# Patient Record
Sex: Female | Born: 1935 | Race: White | Hispanic: No | Smoking: Former smoker
Health system: Southern US, Community
[De-identification: ages and names within clinical notes are randomized; demographics above are authoritative.]

## PROBLEM LIST (undated history)

## (undated) DIAGNOSIS — I4891 Unspecified atrial fibrillation: Secondary | ICD-10-CM

## (undated) HISTORY — PX: CHOLECYSTECTOMY: SHX55

## (undated) HISTORY — PX: BREAST LUMPECTOMY: SHX2

## (undated) HISTORY — PX: ABDOMINAL HYSTERECTOMY: SHX81

---

## 2017-11-12 ENCOUNTER — Telehealth: Payer: Self-pay | Admitting: Hematology and Oncology

## 2017-11-12 ENCOUNTER — Encounter: Payer: Self-pay | Admitting: Hematology and Oncology

## 2017-11-12 NOTE — Telephone Encounter (Signed)
New referral received from Dr. Lennette Bihari Via at [place] for history of iron deficiency. Pt has been scheduled to see Dr. Audelia Hives on 10/14 at 1pm. Pt aware to arrive 30 minutes early. Letter mailed.

## 2017-11-22 NOTE — Progress Notes (Signed)
Katrina Randolph  Patient Name:  Katrina Randolph  DOB: 08/06/1930   Date of Service: November 24, 2017  Referring Provider: Lennette Bihari Via MD [place], [place] [address]   Consulting Physician: Henreitta Leber, MD Hematology/Oncology  Reason for Referral: In the setting of iron deficiency anemia over the past 3 years, she presents now for further diagnostic and therapeutic recommendations.  History Present Illness: Katrina Randolph is an 84 year old resident of [place], relocating in June, 2019 from [place], [place] whose past medical history is significant for major depressive disorder; hyperlipidemia; hypothyroidism; borderline diabetes; primary hypertension; gastroesophageal reflux disease; systolic congestive heart failure; paroxysmal atrial fibrillation; and a history of breast cancer treated approximately 4-7 years ago with surgery, followed by radiation therapy.  Although postoperative hormonal therapy was offered, she declined.  The radiation therapy was directed by Dr. Freddi Starr, radiation oncology, at [facility].  Those records are not available on the existing chart but have been requested.  Her primary care physician is Dr. Lennette Bihari Via.  She is accompanied by her attentive daughter Katrina Randolph.  Only recently did she establish care with Dr. Lynelle Doctor.  On October 27, 2017 initial laboratory studies were obtained especially given her chronic iron deficiency anemia over several years of unclear etiology.  In the past she has received parenteral iron infusions when residing in [facility].  She was apparently intolerant of oral iron which caused "gastritis."  She cannot recall the name of her hematologist.  Those records are also not available on the existing chart.  A complete blood count September 18 showed hemoglobin 9.4 hematocrit 30.8 MCV 74.8 MCH 22.8 RDW 18.2 WBC 6.4 with 75%  neutrophils 12% lymphocytes 10% monocytes 2% eosinophils 1% basophils; platelets 247,000.  Iron studies from September 16 showed ferritin 14.7 serum iron 16 TIBC 564 iron saturation 3%.  Hemoglobin A1c 5.8.  Katrina Randolph is not a vegetarian.  She is "borderline diabetic." She has a long-standing history of paroxysmal atrial fibrillation for at least the past 4 years.  She reports no seizure disorder or stroke syndrome.  She has thyroid disease for the past 5 years.  She has no coronary artery disease, angina, prior myocardial infarction.  She reports no viral hepatitis, inflammatory bowel disease, or symptomatic diverticulosis.  She has never had a screening colonoscopy.  Apparently it was recommended and planned, but never performed.  While residing in [place] she had yearly mammography following her lumpectomy for 5 consecutive years.  Her last mammogram was in February.  She has no follow-up mammogram scheduled.  It is her understanding that there is no need for further mammographic surveillance after 5 years.  She has no rheumatoid or gouty arthritis.  She denies peripheral arterial venous thromboembolic disease.    Both her appetite and weight remain stable.  She has no unusual headache, dizziness, lightheadedness, syncope, near syncopal episodes. She has no rash or itching.  She denies any bleeding tendency.  There are no new visual changes or hearing deficit.  She denies cough, sore throat, or orthopnea.  She has no dyspnea at rest or on exertion.  She has chronic but stable exertional dyspnea.  There is no pain or difficulty in swallowing.  No fever, shaking chills, sweats, or flulike symptoms are apparent.  She denies heartburn or indigestion.  She has no nausea, vomiting, diarrhea, or constipation.  There is no urinary urgency, hematuria, or dysuria.  There are no new focal areas of  bone, joint, muscle pain.  She has no bleeding tendency or easy bruisability.  In her family there is no history of any blood  disorders or bleeding tendencies.  It is with this background she presents now for further diagnostic and therapeutic recommendations in the setting of iron deficiency anemia of unclear etiology as outlined above.  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 Her first pregnancy was at age 51 years.   Her last pregnancy was at age 65 years. Her last menses was in her 62s No hormone replacement therapy In her 21s she started on oral contraception for 15 years Her last mammogram was in 2018 while residing in [place], [place] She cannot recall her last Pap smear.  Family History: Mother: Deceased: Age 20 years: Alzheimer disease Father: Deceased: Age 30 years: [place] She has no brothers.  Sisters (1): No known major medical problems  Social History   Socioeconomic History  . Marital status: Widowed    Spouse name: Not on file  . Number of children: Not on file  . Years of education: Not on file  . Highest education level: Not on file  Occupational History  . Not on file  Social Needs  . Financial resource strain: Not on file  . Food insecurity:    Worry: Not on file    Inability: Not on file  . Transportation needs:    Medical: Not on file    Non-medical: Not on file  Tobacco Use  .  Smoking status: Not on file  Substance and Sexual Activity  . Alcohol use: Not on file  . Drug use: Not on file  . Sexual activity: Not on file  Lifestyle  . Physical activity:    Days per week: Not on file    Minutes per session: Not on file  . Stress: Not on file  Relationships  . Social connections:    Talks on phone: Not on file    Gets together: Not on file    Attends religious service: Not on file    Active member of club or organization: Not on file    Attends meetings of clubs or organizations: Not on file    Relationship status: Not on file  . Intimate partner violence:    Fear of current or ex partner: Not on file    Emotionally abused: Not on file    Physically abused: Not on file    Forced sexual activity: Not on file  Other Topics Concern  . Not on file  Social History Narrative  . Not on file  Katrina Randolph is a widow. 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 She began smoking at the age of 45 years. She smoked 1/2 pack of cigarettes daily She stopped smoking at the age of 47 years. Her alcohol intake consists of 2-3 vodkas every week for many years. She reports no recreational drug use.  Transfusion History: No prior transfusion  Exposure History: She has no  known exposure to toxic chemicals or pesticides. She has received therapeutic radiation to the right breast following lumpectomy.  Allergies  Allergen Reactions  . Sulfa Antibiotics     "feels bad"  She has no food allergies She has nonspecific seasonal allergies  Current Outpatient Medications on File Prior to Visit  Medication Sig  . furosemide (LASIX) 20 MG tablet Take 20 mg by mouth daily. PRN  . levothyroxine (SYNTHROID, LEVOTHROID) 100 MCG tablet TAKE 1 TABLET BY MOUTH ON AN EMPTY STOMACH IN THE MORNING  . lisinopril (PRINIVIL,ZESTRIL) 10 MG tablet Take 10 mg by mouth daily.  Marland Kitchen omeprazole (PRILOSEC) 20 MG capsule Take by mouth daily.  . sertraline (ZOLOFT) 50 MG tablet Take 50 mg by  mouth daily.   No current facility-administered medications on file prior to visit.     Review of Systems: Constitutional: No fever, sweats, or shaking chills.  No appetite or weight deficit; energy level low. Skin: No rash, scaling, sores, lumps, or jaundice. HEENT: No visual changes or hearing deficit; no sinusitis or allergic rhinitis. Pulmonary: Chronic dry cough; no sore throat, or orthopnea; former smoker; DOE. Breasts: Breast cancer treated with right lumpectomy followed by radiation therapy. Cardiovascular: No coronary artery disease; compensated systolic congestive heart failure.  No angina, or myocardial infarction.  She has paroxysmal atrial fibrillation, essential hypertension and dyslipidemia. Gastrointestinal: No indigestion, dysphagia, abdominal pain, diarrhea, or constipation.  No change in bowel habits; asymptomatic diverticular disease.  No nausea or vomiting.  No melena or bright red blood per rectum. Genitourinary: No urinary frequency, urgency, hematuria, or dysuria. Musculoskeletal: Degenerative joint disease involving the left knee and fingers.  No new arthralgias or myalgias; no joint swelling, pain, or instability; no rheumatoid or gouty arthritis. Hematologic: No bleeding tendency or easy bruisability; iron deficiency anemia Endocrine: No intolerance to heat or cold; hypothyroid disease; pre-diabetes mellitus. Vascular: No peripheral arterial or venous thromboembolic disease. Psychological: No anxiety. Chronic depression without suicidal ideation.  No new mood changes. Neurological: No vertigo, syncope, or near syncopal episodes; tingling in the fingers or toes; positional dizziness; disequilibrium.  Physical Examination: Vitals:   11/24/17 1700  BP: 122/88  Pulse: 100  Resp: 18  Temp: 98.1 F (36.7 C)  SpO2: 96%    Filed Weights   11/24/17 1700  Weight: 173 lb 5 oz (78.6 kg)  ECOG PERFORMANCE STATUS: 1 Constitutional:  Katrina Randolph is fully nourished  and developed albeit overweight.  She looks age appropriate.  She is friendly and cooperative without respiratory compromise at rest. Skin: No rashes, scaling, dryness, jaundice, or itching. HEENT: Head is normocephalic and atraumatic.  Pupils are equal round and reactive to light and accommodation.  Sclerae are anicteric.  Conjunctivae are pale.  No nystagmus is present.  No sinus tenderness nor oropharyngeal lesions.  Lips without cracking or peeling; tongue without mass, inflammation, or nodularity.  Mucous membranes are moist. Neck: Supple and symmetric.  No jugular venous distention or thyromegaly.  Trachea is midline. Lymphatics: No cervical or supraclavicular lymphadenopathy.  No epitrochlear, axillary, or inguinal lymphadenopathy is appreciated. Breasts: The right nipple is slightly retracted at the site of surgery; no mass, discharge, or dimpling. Respiratory/chest: Thorax is symmetrical.  Breath sounds are clear to auscultation and percussion.  Normal excursion and respiratory effort. Back: Symmetric without deformity; she has point tenderness over the lower lumbar region. Cardiovascular: Heart rate and rhythm are regular. Gastrointestinal: Abdomen is soft, nontender; no organomegaly.  Bowel sounds are normoactive.  No masses are appreciated. Rectal examination:  Not performed. Extremities: In the lower extremities, there is no asymmetric swelling, erythema, tenderness, or cord formation.  No clubbing, cyanosis, nor edema. Hematologic: No petechiae, hematomas, or ecchymoses. Psychological:  She is oriented to person, place, and time; normal affect; memory, and cognitive deficit. Neurological: There are no gross neurologic deficits.  Laboratory Results: I have reviewed the data as listed:  Ref Range & Units 15:20  WBC Count 4.0 - 10.5 K/uL 6.9   RBC 3.87 - 5.11 MIL/uL 4.40   Hemoglobin 12.0 - 15.0 g/dL 9.3Low    Comment: Reticulocyte Hemoglobin testing  may be clinically indicated,   consider ordering this additional  test [id number]   HCT 36.0 - 46.0 % 33.1Low    MCV 80.0 - 100.0 fL 75.2Low    MCH 26.0 - 34.0 pg 21.1Low    MCHC 30.0 - 36.0 g/dL 28.1Low    RDW 11.5 - 15.5 % 18.2High    Platelet Count 150 - 400 K/uL 258   nRBC 0.0 - 0.2 % 0.0   Neutrophils Relative % % 77   Neutro Abs 1.7 - 7.7 K/uL 5.4   Lymphocytes Relative % 11   Lymphs Abs 0.7 - 4.0 K/uL 0.7   Monocytes Relative % 9   Monocytes Absolute 0.1 - 1.0 K/uL 0.6   Eosinophils Relative % 2   Eosinophils Absolute 0.0 - 0.5 K/uL 0.1   Basophils Relative % 1   Basophils Absolute 0.0 - 0.1 K/uL 0.1   Immature Granulocytes % 0   Abs Immature Granulocytes 0.00 - 0.07 K/uL 0.03       Reticulocyte count 0.9% DAT negative Vitamin B12 309  Summary/Assessment: In the setting of iron deficiency anemia over the past 3 years, she presents now for further diagnostic and therapeutic recommendations.  Only recently did she establish care with Dr. Lynelle Doctor.  On October 27, 2017 initial laboratory studies were obtained given her chronic iron deficiency anemia over several years of unclear etiology.  In the past she has received parenteral iron infusions when residing in [facility]  Those records are not available on the existing chart.    A complete blood count on September 16 showed hemoglobin 9.4 hematocrit 30.8 MCV 74.8 MCH 22.8 RDW 18.2 WBC 6.4 with 75% neutrophils 12% lymphocytes 10% monocytes 2% eosinophils 1% basophils; platelets 247,000.  Iron studies showed ferritin 14.7 serum iron 16 TIBC 564 iron saturation 3%.   Katrina Randolph is not a vegetarian.  She is "borderline diabetic." She has a long-standing history of paroxysmal atrial fibrillation for at least the past 4 years.  She reports no seizure disorder or stroke syndrome.  She has thyroid disease for the past 5 years.  She has no coronary artery disease, angina, prior myocardial infarction.  She reports no viral hepatitis, inflammatory bowel disease, or  symptomatic diverticulosis.  She has never had a screening colonoscopy.  Apparently it was recommended and planned, but never performed.  While residing in [facility], she had yearly mammography following her lumpectomy for 5 consecutive years.  Her last mammogram was in February.  She has no follow-up mammogram scheduled.  It is her understanding that there is no need for further mammographic surveillance after 5 years.  She has no rheumatoid or gouty arthritis.  She denies peripheral arterial venous thromboembolic disease.    Both her appetite and weight remain stable. She admits to positional dizziness and disequilibrium.  She has no unusual headache, lightheadedness, syncope, near syncopal episodes. She has no rash or itching.  She  denies any bleeding tendency.  There are no new visual changes or hearing deficit.  She denies cough, sore throat, or orthopnea.  She has no dyspnea at rest or on exertion.  She has chronic but stable exertional dyspnea.  There is no pain or difficulty in swallowing.  No fever, shaking chills, sweats, or flulike symptoms are apparent.  She denies heartburn or indigestion.  She has no nausea, vomiting, diarrhea, or constipation.  There is no urinary urgency, hematuria, or dysuria.  There are no new focal areas of bone, joint, muscle pain.  She has no bleeding tendency or easy bruisability.  In her family there is no history of any blood disorders or bleeding tendencies.    Her other comorbid problems include major depressive disorder; hyperlipidemia; hypothyroidism; borderline diabetes; primary hypertension; gastroesophageal reflux disease; systolic congestive heart failure; paroxysmal atrial fibrillation; and a history of breast cancer treated with approximately 4-7 years ago, treated surgically followed by radiation therapy.  Although postoperative hormonal therapy was offered she declined.  The radiation therapy was directed by Dr. Freddi Starr, radiation oncology, at  [facility].  Recommendation/Plan: The results of her laboratory studies from today were not available at the time of discharge.  Some of those results are outlined above.  They will be discussed in detail at the time of her next visit.  Her laboratory studies from September, however, confirmed the presence of anemia of iron deficiency.  It was recommended that additional laboratory studies be obtained today to exclude any potential contributing factors to her anemia such as vitamin Z61, folic acid, or hemolysis.  Those results are pending.  She was advised to receive intravenous iron since oral iron has been ineffective and not well-tolerated in the past.  Following authorization, her first dose of ferumoxytol will be administered on October 22.  There is still exists the question of the etiology behind her iron deficiency.  At her request, through Dr. Tereasa Coop office, an endoscopic evaluation was recommended to identify the source of iron loss (bleeding).  Until proven otherwise, she is losing blood from the gastrointestinal tract.  At her age there is a concern for an underlying malignancy.  That referral will be placed by Dr. Lynelle Doctor.  Furthermore, it was recommended that a bilateral diagnostic mammogram be performed as soon as possible given her history of breast cancer.  She wishes that that referral also be placed by Dr. Lynelle Doctor.  If possible, the surgical histopathology and radiation and/or medical oncology records should be obtained from [facility] and Dr.Stephen Rosana Berger, radiation oncology.  She was advised to call their office directly for those records and submit them in person to be copied/scanned.  She verbalized understanding.  Barring any unforeseen complications her next scheduled doctor visit is on October 22 to discuss those results prior to her first iron infusion.  She was advised to call us in the interim should any new or untoward problems arise.  The total  time spent discussing her prior history of iron deficiency anemia, most recent laboratory studies, role and rationale for excluding other possible nutritional factors and hemolysis contributing to her anemia, importance of obtaining yearly mammographic surveillance and the need for a diagnostic colonoscopy and or esophagogastroduodenoscopy given her iron deficiency anemia of unclear etiology  was 60 minutes.  At least 50% of that time was spent in discussion, counseling, and answering questions.   This note was dictated using voice activated technology/software.  Unfortunately, typographical errors are not uncommon, and transcription  is subject to mistakes and regrettably misinterpretation.  If necessary, clarification of the above information can be discussed with me at any time.  Thank you Dr. Lynelle Doctor for allowing my participation in the care of Katrina Randolph. I will keep you closely informed as the results of her preliminary laboratory data become available.  Please do not hesitate to call should any questions arise regarding this initial Randolph and discussion.  FOLLOW UP: AS DIRECTED   cc:      Lennette Bihari Via MD  Henreitta Leber, MD  Hematology/Oncology [place] [address] [place], [place] [address] Office: [phone] NOBS: [phone]

## 2017-11-24 ENCOUNTER — Inpatient Hospital Stay: Payer: Medicare Other

## 2017-11-24 ENCOUNTER — Encounter: Payer: Self-pay | Admitting: Hematology and Oncology

## 2017-11-24 ENCOUNTER — Other Ambulatory Visit: Payer: Self-pay

## 2017-11-24 ENCOUNTER — Telehealth: Payer: Self-pay | Admitting: Hematology and Oncology

## 2017-11-24 ENCOUNTER — Inpatient Hospital Stay: Payer: Medicare Other | Attending: Hematology and Oncology | Admitting: Hematology and Oncology

## 2017-11-24 VITALS — BP 122/88 | HR 100 | Temp 98.1°F | Resp 18 | Wt 173.3 lb

## 2017-11-24 DIAGNOSIS — C50919 Malignant neoplasm of unspecified site of unspecified female breast: Secondary | ICD-10-CM | POA: Insufficient documentation

## 2017-11-24 DIAGNOSIS — Z923 Personal history of irradiation: Secondary | ICD-10-CM | POA: Diagnosis not present

## 2017-11-24 DIAGNOSIS — K219 Gastro-esophageal reflux disease without esophagitis: Secondary | ICD-10-CM | POA: Insufficient documentation

## 2017-11-24 DIAGNOSIS — D508 Other iron deficiency anemias: Secondary | ICD-10-CM

## 2017-11-24 DIAGNOSIS — D649 Anemia, unspecified: Secondary | ICD-10-CM

## 2017-11-24 DIAGNOSIS — I10 Essential (primary) hypertension: Secondary | ICD-10-CM

## 2017-11-24 DIAGNOSIS — Z853 Personal history of malignant neoplasm of breast: Secondary | ICD-10-CM | POA: Insufficient documentation

## 2017-11-24 DIAGNOSIS — F329 Major depressive disorder, single episode, unspecified: Secondary | ICD-10-CM

## 2017-11-24 DIAGNOSIS — I480 Paroxysmal atrial fibrillation: Secondary | ICD-10-CM | POA: Diagnosis not present

## 2017-11-24 DIAGNOSIS — E039 Hypothyroidism, unspecified: Secondary | ICD-10-CM

## 2017-11-24 DIAGNOSIS — R7303 Prediabetes: Secondary | ICD-10-CM

## 2017-11-24 DIAGNOSIS — D509 Iron deficiency anemia, unspecified: Secondary | ICD-10-CM | POA: Diagnosis not present

## 2017-11-24 DIAGNOSIS — Z87891 Personal history of nicotine dependence: Secondary | ICD-10-CM

## 2017-11-24 DIAGNOSIS — E785 Hyperlipidemia, unspecified: Secondary | ICD-10-CM

## 2017-11-24 DIAGNOSIS — C50111 Malignant neoplasm of central portion of right female breast: Secondary | ICD-10-CM

## 2017-11-24 DIAGNOSIS — F32A Depression, unspecified: Secondary | ICD-10-CM

## 2017-11-24 LAB — CBC WITH DIFFERENTIAL (CANCER CENTER ONLY)
Abs Immature Granulocytes: 0.03 10*3/uL (ref 0.00–0.07)
Basophils Absolute: 0.1 10*3/uL (ref 0.0–0.1)
Basophils Relative: 1 %
Eosinophils Absolute: 0.1 10*3/uL (ref 0.0–0.5)
Eosinophils Relative: 2 %
HCT: 33.1 % — ABNORMAL LOW (ref 36.0–46.0)
Hemoglobin: 9.3 g/dL — ABNORMAL LOW (ref 12.0–15.0)
Immature Granulocytes: 0 %
Lymphocytes Relative: 11 %
Lymphs Abs: 0.7 10*3/uL (ref 0.7–4.0)
MCH: 21.1 pg — ABNORMAL LOW (ref 26.0–34.0)
MCHC: 28.1 g/dL — ABNORMAL LOW (ref 30.0–36.0)
MCV: 75.2 fL — ABNORMAL LOW (ref 80.0–100.0)
Monocytes Absolute: 0.6 10*3/uL (ref 0.1–1.0)
Monocytes Relative: 9 %
Neutro Abs: 5.4 10*3/uL (ref 1.7–7.7)
Neutrophils Relative %: 77 %
Platelet Count: 258 10*3/uL (ref 150–400)
RBC: 4.4 MIL/uL (ref 3.87–5.11)
RDW: 18.2 % — ABNORMAL HIGH (ref 11.5–15.5)
WBC Count: 6.9 10*3/uL (ref 4.0–10.5)
nRBC: 0 % (ref 0.0–0.2)

## 2017-11-24 LAB — RETICULOCYTES
Immature Retic Fract: 15.4 % (ref 2.3–15.9)
RBC.: 4.4 MIL/uL (ref 3.87–5.11)
Retic Count, Absolute: 40 10*3/uL (ref 19.0–186.0)
Retic Ct Pct: 0.9 % (ref 0.4–3.1)

## 2017-11-24 LAB — VITAMIN B12: Vitamin B-12: 309 pg/mL (ref 180–914)

## 2017-11-24 LAB — DIRECT ANTIGLOBULIN TEST (NOT AT ARMC)
DAT, IgG: NEGATIVE
DAT, complement: NEGATIVE

## 2017-11-24 NOTE — Telephone Encounter (Signed)
Scheduled appt per 10/14 los - gave patient AVS and calender per los. Scheduled on 10/23 due to availability in tx area/

## 2017-11-24 NOTE — Patient Instructions (Addendum)
We discussed in detail your past medical history and recent laboratory studies from September.  Those results confirm the presence of anemia of iron deficiency.  Laboratory studies were obtained today to exclude any other factors that may be contributing to your anemia, such as vitamin F29, folic acid, or premature destruction of cells (hemolysis).  It is recommended that she start intravenous iron since oral iron has been ineffective and not well-tolerated in the past.    Through Dr. Lynelle Doctor, and endoscopic evaluation (colonoscopy and/or EGD) was recommended to identify a source of iron loss (bleeding).  It is recommended that a diagnostic bilateral mammogram to be performed as soon as possible given your history of breast cancer.  If possible, the pathology and radiation and/or medical oncology records should be obtained from [facility] and Dr. Freddi Starr, radiation oncology.  Please bring those records with you at the time of your next appointment or when available.  It is recommended that she start oral iron with ferrous sulfate: 325 mg once daily.  Barring any unforeseen complications, your next scheduled doctor visit with laboratory studies is on October 22.  Iron will be infused at that same visit following our discussion regarding the laboratory studies.  Please do not hesitate to call if any questions or problems arise in the interim.  Thank you! Ladona Ridgel, MD Hematology/Oncology [phone]

## 2017-11-25 ENCOUNTER — Telehealth: Payer: Self-pay | Admitting: Emergency Medicine

## 2017-11-25 LAB — FERRITIN: Ferritin: 10 ng/mL — ABNORMAL LOW (ref 11–307)

## 2017-11-25 LAB — HAPTOGLOBIN: Haptoglobin: 83 mg/dL (ref 34–200)

## 2017-11-25 LAB — FOLATE: Folate: 34.1 ng/mL (ref >5.9)

## 2017-11-25 LAB — TSH: TSH: 5.172 u[IU]/mL — ABNORMAL HIGH (ref 0.308–3.960)

## 2017-11-25 LAB — LACTATE DEHYDROGENASE: LDH: 140 U/L (ref 98–192)

## 2017-11-25 NOTE — Telephone Encounter (Signed)
Faxed visit information for MD ruben visit on 10/14 to MD Via.  Fax received.

## 2017-11-25 NOTE — Telephone Encounter (Signed)
Called pt to remind her of upcoming appts on 10/23 for MD Ruben and iron infusion.  Verbalized understanding of time and date.

## 2017-12-01 ENCOUNTER — Other Ambulatory Visit: Payer: Self-pay | Admitting: Hematology and Oncology

## 2017-12-03 ENCOUNTER — Inpatient Hospital Stay (HOSPITAL_BASED_OUTPATIENT_CLINIC_OR_DEPARTMENT_OTHER): Payer: Medicare Other | Admitting: Hematology and Oncology

## 2017-12-03 ENCOUNTER — Inpatient Hospital Stay: Payer: Medicare Other

## 2017-12-03 ENCOUNTER — Encounter: Payer: Self-pay | Admitting: Hematology and Oncology

## 2017-12-03 VITALS — BP 113/65 | HR 85 | Temp 98.1°F | Resp 18

## 2017-12-03 VITALS — BP 101/75 | HR 94 | Temp 97.6°F | Resp 18 | Ht 62.0 in | Wt 172.0 lb

## 2017-12-03 DIAGNOSIS — D509 Iron deficiency anemia, unspecified: Secondary | ICD-10-CM

## 2017-12-03 DIAGNOSIS — Z87891 Personal history of nicotine dependence: Secondary | ICD-10-CM

## 2017-12-03 DIAGNOSIS — Z853 Personal history of malignant neoplasm of breast: Secondary | ICD-10-CM

## 2017-12-03 DIAGNOSIS — D508 Other iron deficiency anemias: Secondary | ICD-10-CM

## 2017-12-03 DIAGNOSIS — R7303 Prediabetes: Secondary | ICD-10-CM

## 2017-12-03 DIAGNOSIS — Z923 Personal history of irradiation: Secondary | ICD-10-CM

## 2017-12-03 DIAGNOSIS — I10 Essential (primary) hypertension: Secondary | ICD-10-CM | POA: Diagnosis not present

## 2017-12-03 DIAGNOSIS — I480 Paroxysmal atrial fibrillation: Secondary | ICD-10-CM | POA: Diagnosis not present

## 2017-12-03 MED ORDER — SODIUM CHLORIDE 0.9 % IV SOLN
Freq: Once | INTRAVENOUS | Status: AC
  Administered 2017-12-03: 16:00:00 via INTRAVENOUS
  Filled 2017-12-03: qty 250

## 2017-12-03 MED ORDER — SODIUM CHLORIDE 0.9 % IV SOLN
510.0000 mg | Freq: Once | INTRAVENOUS | Status: AC
  Administered 2017-12-03: 510 mg via INTRAVENOUS
  Filled 2017-12-03: qty 17

## 2017-12-03 NOTE — Progress Notes (Signed)
[facility] Outpatient Progress Note  Patient Name:  Katrina Randolph  DOB: May 31, 1930   Date of Service: December 03, 2017  Referring Provider:  Lennette Bihari Via MD [place], [place] [address]   Consulting Physician: Henreitta Leber, MD Hematology/Oncology  Reason for Visit: In the setting of iron deficiency anemia over the past 3 years, she presents now for the results of her preliminary laboratory evaluation, initial parenteral iron infusion, and recommendations.  Brief History: Katrina Randolph is an 84 year old resident of [place], relocating in June, 2019 from [place], [place] whose past medical history is significant for major depressive disorder; hyperlipidemia; hypothyroidism; borderline diabetes; primary hypertension; gastroesophageal reflux disease; systolic congestive heart failure; paroxysmal atrial fibrillation; and a history of breast cancer treated approximately 4-7 years ago with surgery, followed by radiation therapy.  Although postoperative hormonal therapy was offered, she declined.  The radiation therapy was directed by Dr. Freddi Starr, radiation oncology, at [facility].  In her family there is no history of any blood disorders or bleeding tendencies. Those records are not available on the existing chart but have been requested.  Her primary care physician is Dr. Lennette Bihari Via.  She is accompanied by her attentive daughter Santiago Glad.  Only recently did she establish care with Dr. Lynelle Doctor.  On October 27, 2017 initial laboratory studies were obtained especially given her chronic iron deficiency anemia over several years of unclear etiology.  In the past she has received parenteral iron infusions when residing in [facility].  She was apparently intolerant of oral iron which caused "gastritis."  She cannot recall the name of her hematologist.  Those records are also not available on the existing  chart.  A complete blood count September 18 showed hemoglobin 9.4 hematocrit 30.8 MCV 74.8 MCH 22.8 RDW 18.2 WBC 6.4 with 75% neutrophils 12% lymphocytes 10% monocytes 2% eosinophils 1% basophils; platelets 247,000.  Iron studies from September 16 showed ferritin 14.7 serum iron 16 TIBC 564 iron saturation 3%.  Hemoglobin A1c 5.8.  Jahmia is not a vegetarian.  She is "borderline diabetic." She has a long-standing history of paroxysmal atrial fibrillation for at least the past 4 years. She has never had a screening colonoscopy.  Apparently it was recommended and planned, but never performed.  While residing in [place] she had yearly mammography following her lumpectomy for 5 consecutive years. Her last mammogram was in February.  She has no follow-up mammogram scheduled.    Her laboratory studies from September confirmed the presence of iron deficiency.  She is scheduled to receive her first dose of parenteral iron our [facility] today.  She has however received parenteral iron in the past (2018).  Oral iron has not been either tolerated or effective in her. Since her last visit, arrangements are underway to undergo a diagnostic colonoscopy and if necessary esophagogastroduodenoscopy.  It is with this background she presents now for the results of her preliminary evaluation and recommendations.  Interval History: In the interim since her last visit, both her appetite and weight remain stable. Her energy level is lower. She has no unusual headache, dizziness, lightheadedness, syncope, near syncopal episodes. She has no rash or itching.  She denies any bleeding tendency.  There are no new visual changes or hearing deficit.  She denies cough, sore throat, or orthopnea.  She has no dyspnea at rest or on exertion.  She has chronic but stable exertional dyspnea.  There is no pain or difficulty in  swallowing.  No fever, shaking chills, sweats, or flulike symptoms are apparent.  She denies heartburn or  indigestion.  She has no nausea, vomiting, diarrhea, or constipation.  There is no urinary urgency, hematuria, or dysuria.  There are no new focal areas of bone, joint, muscle pain.    She continues to have pain in the left knee.  She has no bleeding tendency or easy bruisability.   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 Sulfa Antibiotics     "feels bad"  She has no food allergies She has nonspecific seasonal allergies  Current Outpatient Medications on File Prior to Visit  Medication Sig  . furosemide (LASIX) 20 MG tablet Take 20 mg by mouth daily. PRN  . levothyroxine (SYNTHROID, LEVOTHROID) 100 MCG tablet TAKE 1 TABLET BY MOUTH ON AN EMPTY STOMACH IN THE MORNING  . lisinopril (PRINIVIL,ZESTRIL) 10 MG tablet Take 10 mg by mouth daily.  Marland Kitchen omeprazole (PRILOSEC) 20 MG capsule Take by mouth daily.  . sertraline (ZOLOFT) 50 MG tablet Take 50 mg by mouth daily.   No current facility-administered medications on file prior to visit.     Review of Systems: Constitutional: No fever, sweats, or shaking chills.  No appetite or weight deficit; energy level low. Skin: No rash, scaling, sores, lumps, or jaundice. HEENT: No visual changes or hearing deficit; no sinusitis or allergic rhinitis. Pulmonary: Chronic dry cough; no sore throat, or orthopnea; former smoker; DOE. Breasts: Breast cancer treated with right lumpectomy followed by radiation  therapy. Cardiovascular: No coronary artery disease; compensated systolic congestive heart failure.  No angina, or myocardial infarction.  She has paroxysmal atrial fibrillation, essential hypertension and dyslipidemia. Gastrointestinal: No indigestion, dysphagia, abdominal pain, diarrhea, or constipation.  No change in bowel habits; asymptomatic diverticular disease.  No nausea or vomiting.  No melena or bright red blood per rectum. Genitourinary: No urinary frequency, urgency, hematuria, or dysuria. Musculoskeletal: Degenerative joint disease involving the left knee and fingers.  No new arthralgias or myalgias; no joint swelling, pain, or instability; no rheumatoid or gouty arthritis. Hematologic: No bleeding tendency or easy bruisability; iron deficiency anemia Endocrine: No intolerance to heat or cold; hypothyroid disease; pre-diabetes mellitus. Vascular: No peripheral arterial or venous thromboembolic disease. Psychological: No anxiety. Chronic depression without suicidal ideation.  No new mood changes. Neurological: No vertigo, syncope, or near syncopal episodes; tingling in the fingers or toes; positional dizziness; disequilibrium.  Physical Examination: Vital Signs: Body surface area is 1.85 meters squared.  Vitals:   12/03/17 1425  BP: 101/75  Pulse: 94  Resp: 18  Temp: 97.6 F (36.4 C)  SpO2: 100%    Filed Weights   12/03/17 1425  Weight: 172 lb (78 kg)  Body mass index is 31.46 kg/m. ECOG PERFORMANCE STATUS: 1 Constitutional:  Raahi Korber is fully nourished and developed albeit overweight.  She looks age appropriate.  She is friendly and cooperative without respiratory compromise at rest. Skin: No rashes, scaling, dryness, jaundice, or itching. HEENT: Head is normocephalic and atraumatic.  Pupils are equal round and reactive to  light and accommodation.  Sclerae are anicteric.  Conjunctivae are pale.  No nystagmus is present.  No sinus tenderness nor oropharyngeal lesions.   Lips without cracking or peeling; tongue without mass, inflammation, or nodularity.  Mucous membranes are moist. Neck: Supple and symmetric.  No jugular venous distention or thyromegaly.  Trachea is midline. Lymphatics: No cervical or supraclavicular lymphadenopathy.  No epitrochlear, axillary, or inguinal lymphadenopathy is appreciated. Breasts: The right nipple is slightly retracted at the site of surgery; no mass, discharge, or dimpling. Respiratory/chest: Thorax is symmetrical.  Breath sounds are clear to auscultation and percussion.  Normal excursion and respiratory effort. Back: Symmetric without deformity; she has point tenderness over the lower lumbar region. Cardiovascular: Heart rate and rhythm are regular. Gastrointestinal: Abdomen is soft, nontender; no organomegaly.  Bowel sounds are normoactive.  No masses are appreciated. Rectal examination: Not performed. Extremities: In the lower extremities, there is no asymmetric swelling, erythema, tenderness, or cord formation.  No clubbing, cyanosis, nor edema. Hematologic: No petechiae, hematomas, or ecchymoses. Psychological:  She is oriented to person, place, and time; normal affect; memory, and cognitive deficit. Neurological: There are no gross neurologic deficits.  Laboratory Results: November 24, 2017  Ref Range & Units 9d ago  WBC Count 4.0 - 10.5 K/uL 6.9   RBC 3.87 - 5.11 MIL/uL 4.40   Hemoglobin 12.0 - 15.0 g/dL 9.3Low    Comment: Reticulocyte Hemoglobin testing  may be clinically indicated,  consider ordering this additional  test [id number]   HCT 36.0 - 46.0 % 33.1Low    MCV 80.0 - 100.0 fL 75.2Low    MCH 26.0 - 34.0 pg 21.1Low    MCHC 30.0 - 36.0 g/dL 28.1Low    RDW 11.5 - 15.5 % 18.2High    Platelet Count 150 - 400 K/uL 258   nRBC 0.0 - 0.2 % 0.0   Neutrophils Relative % % 77   Neutro Abs 1.7 - 7.7 K/uL 5.4   Lymphocytes Relative % 11   Lymphs Abs 0.7 - 4.0 K/uL 0.7   Monocytes Relative % 9   Monocytes Absolute  0.1 - 1.0 K/uL 0.6   Eosinophils Relative % 2   Eosinophils Absolute 0.0 - 0.5 K/uL 0.1   Basophils Relative % 1   Basophils Absolute 0.0 - 0.1 K/uL 0.1   Immature Granulocytes % 0   Abs Immature Granulocytes 0.00 - 0.07 K/uL 0.03   Reticulocyte count 0.9% Haptoglobin 83 LDH 140 TSH 5.172 (0.308-3.96) DAT: Negative Vitamin S06 301 Folic acid 60.1 Ferritin 10  Summary/Assessment: In the setting of iron deficiency anemia over the past 3 years, she presents now for the results of her preliminary laboratory evaluation and recommendations.  Only recently did she establish care with Dr. Lynelle Doctor.  On October 27, 2017 initial laboratory studies were obtained especially given her chronic iron deficiency anemia over several years of unclear etiology.  In the past she has received parenteral iron infusions when residing in [facility].  She was apparently intolerant of oral iron which caused "gastritis."  A complete blood count September 18 showed hemoglobin 9.4 hematocrit 30.8 MCV 74.8 MCH 22.8 RDW 18.2 WBC 6.4 with 75% neutrophils 12% lymphocytes 10% monocytes 2% eosinophils 1% basophils; platelets 247,000.  Iron studies from September 16 showed ferritin 14.7 serum iron 16 TIBC 564 iron saturation 3%.  Hemoglobin A1c 5.8.  Shayleen is not a vegetarian.  She is "borderline diabetic." She has a long-standing history of paroxysmal atrial fibrillation for at least the past 4 years. She  has never had a screening colonoscopy.  Apparently it was recommended and planned, but never performed.  While residing in [place] she had yearly mammography following her lumpectomy for 5 consecutive years. Her last mammogram was in February.  She has no follow-up mammogram scheduled.      Her laboratory studies from September confirmed the presence of iron deficiency.  She is scheduled to receive her first dose of parenteral iron our [facility] today.  She has however received parenteral iron in the past (2018).  Oral  iron has not been either tolerated or effective in her. Since her last visit, arrangements are underway to undergo a diagnostic colonoscopy and if necessary esophagogastroduodenoscopy.    In the interim since her last visit, both her appetite and weight remain stable. Her energy level is lower. She has no unusual headache, dizziness, lightheadedness, syncope, near syncopal episodes. She has no rash or itching.  She denies any bleeding tendency.  There are no new visual changes or hearing deficit.  She denies cough, sore throat, or orthopnea.  She has no dyspnea at rest or on exertion.  She has chronic but stable exertional dyspnea.  There is no pain or difficulty in swallowing.  No fever, shaking chills, sweats, or flulike symptoms are apparent.  She denies heartburn or indigestion.  She has no nausea, vomiting, diarrhea, or constipation.  There is no urinary urgency, hematuria, or dysuria.  There are no new focal areas of bone, joint, muscle pain.    She continues to have pain in the left knee.  She has no bleeding tendency or easy bruisability.   Her other comorbid problems include major depressive disorder; hyperlipidemia; hypothyroidism; borderline diabetes; primary hypertension; gastroesophageal reflux disease; systolic congestive heart failure; paroxysmal atrial fibrillation; and a history of breast cancer treated approximately 4-7 years ago with surgery, followed by radiation therapy.  Although postoperative hormonal therapy was offered, she declined.  The radiation therapy was directed by Dr. Freddi Starr, radiation oncology, at [facility].    Recommendation/Plan: We discussed in detail the results of all of her laboratory studies from her initial visit.  Those results revealed no evidence of nutritional deficiency or hemolysis.  She will refer receive her first dose of ferumoxytol: 510 mg intravenously today in our [facility].  It was recommended that she move forward  under the direction of Dr. Via to obtain a endoscopic evaluation as previously discussed above.  Questions were answered to her satisfaction.  Any changes in her thyroid medication, I would leave to the discretion of Dr. Lynelle Doctor.  Barring any unforeseen complications, her next scheduled doctor visit with laboratory studies is on November 13.  She was advised to call us in the interim should any new or untoward problems arise.  The total time spent discussing the her previous laboratory studies, role and rationale for obtaining a diagnostic colonoscopy and/or esophagogastroduodenoscopy given her iron deficiency anemia was 25 minutes. At least 50% of that time was spent in face-to-face discussion, counseling, and answering questions.  This note was dictated using voice activated technology/software.  Unfortunately, typographical errors are not uncommon, and transcription is subject to mistakes and regrettably misinterpretation.  If necessary, clarification of the above information can be discussed with me at any time.  FOLLOW UP: AS DIRECTED   cc:      Lennette Bihari Via MD   Henreitta Leber, MD  Hematology/Oncology [place] [address] [place], [place] [address] Office: [phone] Main:  [phone] 

## 2017-12-03 NOTE — Patient Instructions (Signed)
We discussed in detail the results of all of your laboratory studies from your initial visit.  Those results revealed no evidence of nutritional deficiency, such as vitamin U86 or folic acid, and no evidence of premature destruction of red blood cells.  Both the white blood cell and platelet counts were normal.  You will receive your first iron infusion in our [facility] today.  Barring any unforeseen complications, your next scheduled doctor visit with laboratory studies is on November 13.  Please do not hesitate to call should any new or untoward problems arise area  Thank you! Ladona Ridgel, MD Hematology/Oncology

## 2017-12-03 NOTE — Patient Instructions (Signed)

## 2017-12-04 ENCOUNTER — Telehealth: Payer: Self-pay | Admitting: Hematology and Oncology

## 2017-12-04 NOTE — Telephone Encounter (Signed)
Scheduled Nov appt.  Mailed schedule.

## 2017-12-23 ENCOUNTER — Other Ambulatory Visit: Payer: Self-pay | Admitting: Hematology and Oncology

## 2017-12-23 DIAGNOSIS — D509 Iron deficiency anemia, unspecified: Secondary | ICD-10-CM

## 2017-12-24 ENCOUNTER — Telehealth: Payer: Self-pay | Admitting: Hematology and Oncology

## 2017-12-24 ENCOUNTER — Inpatient Hospital Stay (HOSPITAL_BASED_OUTPATIENT_CLINIC_OR_DEPARTMENT_OTHER): Payer: Medicare Other | Admitting: Hematology and Oncology

## 2017-12-24 ENCOUNTER — Encounter: Payer: Self-pay | Admitting: Hematology and Oncology

## 2017-12-24 ENCOUNTER — Inpatient Hospital Stay: Payer: Medicare Other | Attending: Hematology and Oncology

## 2017-12-24 VITALS — BP 136/89 | HR 98 | Temp 97.5°F | Resp 19 | Ht 62.0 in | Wt 173.1 lb

## 2017-12-24 DIAGNOSIS — D509 Iron deficiency anemia, unspecified: Secondary | ICD-10-CM | POA: Insufficient documentation

## 2017-12-24 DIAGNOSIS — I10 Essential (primary) hypertension: Secondary | ICD-10-CM | POA: Diagnosis not present

## 2017-12-24 DIAGNOSIS — Z79899 Other long term (current) drug therapy: Secondary | ICD-10-CM

## 2017-12-24 DIAGNOSIS — D508 Other iron deficiency anemias: Secondary | ICD-10-CM

## 2017-12-24 LAB — CBC WITH DIFFERENTIAL (CANCER CENTER ONLY)
Abs Immature Granulocytes: 0.02 10*3/uL (ref 0.00–0.07)
Basophils Absolute: 0.1 10*3/uL (ref 0.0–0.1)
Basophils Relative: 1 %
Eosinophils Absolute: 0.1 10*3/uL (ref 0.0–0.5)
Eosinophils Relative: 2 %
HCT: 41.1 % (ref 36.0–46.0)
Hemoglobin: 11.8 g/dL — ABNORMAL LOW (ref 12.0–15.0)
Immature Granulocytes: 0 %
Lymphocytes Relative: 9 %
Lymphs Abs: 0.7 10*3/uL (ref 0.7–4.0)
MCH: 23.2 pg — ABNORMAL LOW (ref 26.0–34.0)
MCHC: 28.7 g/dL — ABNORMAL LOW (ref 30.0–36.0)
MCV: 80.9 fL (ref 80.0–100.0)
Monocytes Absolute: 0.8 10*3/uL (ref 0.1–1.0)
Monocytes Relative: 10 %
Neutro Abs: 6 10*3/uL (ref 1.7–7.7)
Neutrophils Relative %: 78 %
Platelet Count: 219 10*3/uL (ref 150–400)
RBC: 5.08 MIL/uL (ref 3.87–5.11)
RDW: 27.1 % — ABNORMAL HIGH (ref 11.5–15.5)
WBC Count: 7.7 10*3/uL (ref 4.0–10.5)
nRBC: 0 % (ref 0.0–0.2)

## 2017-12-24 LAB — FERRITIN: Ferritin: 136 ng/mL (ref 11–307)

## 2017-12-24 NOTE — Patient Instructions (Signed)
We discussed in detail the results of your laboratory studies from today.  There is marked improvement following your iron infusion.  Copies of your lab work were given to you prior to discharge.  It was strongly recommended that you obtain a mammogram for ongoing surveillance following your history of breast cancer.  I also recommended in the strongest of terms to consider a diagnostic colonoscopy since until proven otherwise, you have a gastrointestinal source of blood loss.  Please discuss this further with Dr. Lynelle Doctor.  Barring any unforeseen complications, your next scheduled doctor visit with laboratory studies is on January 14, 2018.  Please do not hesitate to call should any new or untoward problems arise in the interim.  Thank you! Happy happy Thanksgiving! Ladona Ridgel, MD Hematology/Oncology

## 2017-12-24 NOTE — Progress Notes (Signed)
Hematology/Oncology  Patient Name:  Katrina Randolph  DOB:10/01/1930   Date of Service: December 24, 2017  Referring Provider: Dineen Kid, [place], [place] [address]   Consulting Physician: Henreitta Leber, MD Hematology/Oncology  Subjective: "I have iron deficiency anemia, have received infusional iron once, and I am here for the results of my laboratory studies and recommendations."  Objective: Review of Systems: Constitutional: No fever, sweats, or shaking chills. No appetite or weight deficit;energy level low. Skin: No rash, scaling, sores, lumps, or jaundice. HEENT: No visual changes or hearing deficit;no sinusitis or allergic rhinitis. Pulmonary:Chronic drycough;nosore throat, or orthopnea;former smoker;DOE. Breasts:Breast cancer treated withright lumpectomy followed by radiation therapy. Cardiovascular: No coronary artery disease;compensated systolic congestive heart failure. No angina, or myocardial infarction.She has paroxysmal atrial fibrillation,essential hypertensionanddyslipidemia. Gastrointestinal: No indigestion, dysphagia, abdominal pain, diarrhea, or constipation.No change in bowel habits;asymptomatic diverticular disease.No nausea or vomiting. No melena or bright red blood per rectum. Genitourinary: Nourinaryfrequency, urgency, hematuria, or dysuria. Musculoskeletal:Degenerative joint disease involving the left knee and fingers. Nonewarthralgias or myalgias; no joint swelling, pain, or instability;no rheumatoid or gouty arthritis. Hematologic: No bleeding tendency or easy bruisability;iron deficiency anemia Endocrine: No intolerance to heat or cold;hypothyroid disease;pre-diabetes mellitus. Vascular: No peripheral arterial or venous thromboembolic disease. Psychological: No anxiety. Chronicdepressionwithout suicidal ideation. No newmood changes. Neurological: Novertigo, syncope, or near syncopal episodes;  tingling in the fingers or toes;positional dizziness; disequilibrium.  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 Body mass index is 31.66 kg/m. Vitals:   12/24/17 1437 12/24/17 1440  BP: (!) 131/115 136/89  Pulse: 98   Resp: 19   Temp: (!) 97.5 F (36.4 C)   SpO2: 99%     Filed Weights   12/24/17 1437  Weight: 173 lb 1.6 oz (78.5 kg)  ECOG PERFORMANCE STATUS:1 Constitutional:BettyEarnhardtis fully nourished and developedalbeit overweight. She looks age appropriate. She is friendly and cooperative without respiratory compromise at rest. Skin: No rashes, scaling, dryness, jaundice, or itching. HEENT: Head is normocephalic and atraumatic. Pupils are equal round and reactive to light and accommodation. Sclerae are anicteric. Conjunctivae are pale.No nystagmus is present.No sinus tenderness nor oropharyngeal lesions. Lips without cracking or peeling; tongue without mass, inflammation, or nodularity. Mucous membranes are moist. Neck: Supple and symmetric. No jugular venous distention or thyromegaly. Trachea is midline. Lymphatics: No cervical or supraclavicular lymphadenopathy. No epitrochlear, axillary, or inguinal lymphadenopathy is appreciated. Respiratory/chest: Thorax is symmetrical. Breath sounds are clear to auscultation and percussion. Normal excursion and respiratory  effort. Back: Symmetric without deformity;she has point tenderness over the lower lumbar region. Cardiovascular: Heart rate and rhythm are regular. Gastrointestinal: Abdomen is soft, nontender; no organomegaly. Bowel sounds are normoactive. No masses are appreciated. Extremities: In the lower extremities, there is no asymmetric swelling, erythema, tenderness, or cord formation. No clubbing, cyanosis, nor edema. Hematologic: No petechiae, hematomas, or ecchymoses. Psychological: She is oriented to person, place, and time; normal affect;memory, andcognitive deficit. Neurological:There are no gross neurologic deficits.  Interval History: In the interim since her last visit, both her  appetite and weight remain stable. Her energy level is improved. She has no unusual headache, dizziness, lightheadedness, syncope, near syncopal episodes. She has no rash or itching. She denies any bleeding tendency. There are no new visual changes or hearing deficit. She denies cough, sore throat, or orthopnea. She has no dyspnea at rest or on exertion. She has chronic but stable exertional dyspnea. There is no pain or difficulty in swallowing. No fever, shaking chills, sweats, or flulike symptoms are apparent. She denies heartburn or indigestion. She has no nausea, vomiting, diarrhea, or constipation. There is no urinary urgency, hematuria, or dysuria. There are no new focal areas of bone, joint, muscle pain. She continues to have pain in the left knee. She has no bleeding tendency or easy bruisability.   Allergies  Allergen Reactions  . Sulfa Antibiotics     "feels bad"    Current Outpatient Medications on File Prior to Visit  Medication Sig  . furosemide (LASIX) 20 MG tablet Take 20 mg by mouth daily. PRN  . levothyroxine (SYNTHROID, LEVOTHROID) 100 MCG tablet TAKE 1 TABLET BY MOUTH ON AN EMPTY STOMACH IN THE MORNING  . lisinopril (PRINIVIL,ZESTRIL) 10 MG tablet Take 10 mg by mouth daily.  Marland Kitchen omeprazole  (PRILOSEC) 20 MG capsule Take by mouth daily.  . sertraline (ZOLOFT) 50 MG tablet Take 50 mg by mouth daily.   No current facility-administered medications on file prior to visit.     Laboratory Studies: December 24, 2017  Ref Range & Units 14:19 80mo ago  WBC Count 4.0 - 10.5 K/uL 7.7  6.9   RBC 3.87 - 5.11 MIL/uL 5.08  4.40   Hemoglobin 12.0 - 15.0 g/dL 11.8Low   9.3Low  CM  HCT 36.0 - 46.0 % 41.1  33.1Low    MCV 80.0 - 100.0 fL 80.9  75.2Low    MCH 26.0 - 34.0 pg 23.2Low   21.1Low    MCHC 30.0 - 36.0 g/dL 28.7Low   28.1Low    RDW 11.5 - 15.5 % 27.1High   18.2High    Platelet Count 150 - 400 K/uL 219  258   nRBC 0.0 - 0.2 % 0.0  0.0   Neutrophils Relative % % 78  77   Neutro Abs 1.7 - 7.7 K/uL 6.0  5.4   Lymphocytes Relative % 9  11   Lymphs Abs 0.7 - 4.0 K/uL 0.7  0.7   Monocytes Relative % 10  9   Monocytes Absolute 0.1 - 1.0 K/uL 0.8  0.6   Eosinophils Relative % 2  2   Eosinophils Absolute 0.0 - 0.5 K/uL 0.1  0.1   Basophils Relative % 1  1   Basophils Absolute 0.0 - 0.1 K/uL 0.1  0.1   Immature Granulocytes % 0  0   Abs Immature Granulocytes 0.00 - 0.07 K/uL 0.02  0.03       Ferritin 136  November 24, 2017 Reticulocyte count 0.9% Haptoglobin 83 LDH 140 TSH 5.172 (0.308-3.96) DAT: Negative Vitamin Y40 347 Folic acid 42.5 Ferritin 10  Assessment/Summary: Iron deficiency anemia Status post parenteral iron on December 03, 2017 HCT: 33.1 to 41.1% She is clinically stable to improved.  Recommendation/Plan: It was recommended again that she discuss endoscopic evaluation with Dr. Lynelle Doctor. Until proven otherwise, she has a gastrointestinal source of bleeding. The borderline thyroid dysfunction, to be managed by Dr. Lynelle Doctor. She has a history of breast cancer treated with lumpectomy and radiation. In [place], she declined endocrine therapy post radiation. A surveillance  bilateral diagnostic mammogram was strongly recommended.   Against my advice, she refused again.     She was told she was "cured after 5 years." Doctor visit and repeat iron studies on January 14, 2018.  The total time spent discussing the her laboratory studies, role and rationale for obtaining a diagnostic colonoscopy and/or esophagogastroduodenoscopy given her iron deficiency anemia, importance of surveillance mammogram with a history of incompletely treated breast cancer was 25 minutes. At least 50% of that time was spent in face-to-face discussion, counseling, and answering questions.  There was ample time allotted to answer all questions.  This note was dictated using voice activated technology/software.  Unfortunately, typographical errors are not uncommon, and transcription is subject to mistakes and regrettably misinterpretation.  If necessary, clarification of the above information can be discussed with me at any time.  FOLLOW UP: AS DIRECTED    FB:[id number] Via MD   Henreitta Leber, MD  Hematology/Oncology [place] [address] [place], [place] [address] Office: [phone] UYZJ: [phone]

## 2017-12-24 NOTE — Telephone Encounter (Signed)
Scheduled appt per 11/13 los - gave patient AVS and calender per los.   

## 2018-01-11 ENCOUNTER — Other Ambulatory Visit: Payer: Self-pay | Admitting: Hematology and Oncology

## 2018-01-11 DIAGNOSIS — D509 Iron deficiency anemia, unspecified: Secondary | ICD-10-CM

## 2018-01-14 ENCOUNTER — Inpatient Hospital Stay: Payer: Medicare Other

## 2018-01-14 ENCOUNTER — Inpatient Hospital Stay: Payer: Medicare Other | Attending: Hematology and Oncology | Admitting: Hematology and Oncology

## 2018-01-14 ENCOUNTER — Encounter: Payer: Self-pay | Admitting: Hematology and Oncology

## 2018-01-14 VITALS — BP 111/97 | HR 125 | Temp 97.6°F | Resp 19 | Ht 62.0 in | Wt 172.9 lb

## 2018-01-14 DIAGNOSIS — D509 Iron deficiency anemia, unspecified: Secondary | ICD-10-CM

## 2018-01-14 DIAGNOSIS — Z923 Personal history of irradiation: Secondary | ICD-10-CM

## 2018-01-14 DIAGNOSIS — Z79899 Other long term (current) drug therapy: Secondary | ICD-10-CM

## 2018-01-14 DIAGNOSIS — I10 Essential (primary) hypertension: Secondary | ICD-10-CM | POA: Insufficient documentation

## 2018-01-14 DIAGNOSIS — D508 Other iron deficiency anemias: Secondary | ICD-10-CM

## 2018-01-14 DIAGNOSIS — Z853 Personal history of malignant neoplasm of breast: Secondary | ICD-10-CM | POA: Diagnosis not present

## 2018-01-14 DIAGNOSIS — E119 Type 2 diabetes mellitus without complications: Secondary | ICD-10-CM

## 2018-01-14 LAB — CBC WITH DIFFERENTIAL (CANCER CENTER ONLY)
Abs Immature Granulocytes: 0.02 10*3/uL (ref 0.00–0.07)
Basophils Absolute: 0.1 10*3/uL (ref 0.0–0.1)
Basophils Relative: 1 %
Eosinophils Absolute: 0.2 10*3/uL (ref 0.0–0.5)
Eosinophils Relative: 2 %
HCT: 44.3 % (ref 36.0–46.0)
Hemoglobin: 13.2 g/dL (ref 12.0–15.0)
Immature Granulocytes: 0 %
Lymphocytes Relative: 13 %
Lymphs Abs: 0.8 10*3/uL (ref 0.7–4.0)
MCH: 24.5 pg — ABNORMAL LOW (ref 26.0–34.0)
MCHC: 29.8 g/dL — ABNORMAL LOW (ref 30.0–36.0)
MCV: 82.2 fL (ref 80.0–100.0)
Monocytes Absolute: 0.6 10*3/uL (ref 0.1–1.0)
Monocytes Relative: 9 %
Neutro Abs: 4.9 10*3/uL (ref 1.7–7.7)
Neutrophils Relative %: 75 %
Platelet Count: 191 10*3/uL (ref 150–400)
RBC: 5.39 MIL/uL — ABNORMAL HIGH (ref 3.87–5.11)
RDW: 25.3 % — ABNORMAL HIGH (ref 11.5–15.5)
WBC Count: 6.6 10*3/uL (ref 4.0–10.5)
nRBC: 0 % (ref 0.0–0.2)

## 2018-01-14 NOTE — Patient Instructions (Signed)
We discussed in detail the results of your laboratory studies from today.  Your hemoglobin is now within normal range.  No further iron is recommended at this time.  Copies of your lab work were given for review.  It is still recommended that you discuss with Dr. Via endoscopic evaluation given your known iron deficiency without obvious explanation.  Until proven otherwise, there is a likely gastrointestinal source of your iron deficiency/bleeding tendency.  Given your prior history, it is recommended that you obtain a yearly mammogram for surveillance purposes given your history of breast cancer.  A follow-up visit with laboratory studies has been scheduled on January 15.  Because I am leaving the practice, you will be seen and followed by another provider at the time of your next visit.  Please do not hesitate to call in the interim should any new or untoward problems arise.  May you both have the healthiest and happiest of holidays seasons and new year! Ladona Ridgel, MD Hematology/Oncology

## 2018-01-14 NOTE — Progress Notes (Signed)
Hematology/Oncology  Patient Name:  Katrina Randolph  DOB:01-14-1931   Date of Service: January 14, 2018  Referring Provider: Dineen Kid, [place], [place] [address]   Consulting Physician: Henreitta Leber, MD Hematology/Oncology  Subjective: "I have iron deficiency anemia, have received infusional iron once, and I am here for the results of my laboratory studies and recommendations."  Objective: Review of Systems: Constitutional: No fever, sweats, or shaking chills. No appetite or weight deficit;energy level low. Skin: No rash, scaling, sores, lumps, or jaundice. HEENT: No visual changes or hearing deficit;no sinusitis or allergic rhinitis. Pulmonary:Chronic drycough;nosore throat, or orthopnea;former smoker;DOE. Breasts:Breast cancer treated withright lumpectomy followed by radiation therapy. Cardiovascular: No coronary artery disease;compensated systolic congestive heart failure. No angina, or myocardial infarction.She has paroxysmal atrial fibrillation,essential hypertensionanddyslipidemia. Gastrointestinal: No indigestion, dysphagia, abdominal pain, diarrhea, or constipation.No change in bowel habits;asymptomatic diverticular disease.No nausea or vomiting. No melena or bright red blood per rectum. Genitourinary: Nourinaryfrequency, urgency, hematuria, or dysuria. Musculoskeletal:Degenerative joint disease involving the left knee and fingers. Nonewarthralgias or myalgias; no joint swelling, pain, or instability;no rheumatoid or gouty arthritis. Hematologic: No bleeding tendency or easy bruisability;iron deficiency anemia Endocrine: No intolerance to heat or cold;hypothyroid disease;pre-diabetes mellitus. Vascular: No peripheral arterial or venous thromboembolic disease. Psychological: No anxiety. Chronicdepressionwithout suicidal ideation. No newmood changes. Neurological: Novertigo, syncope, or near syncopal episodes;  tingling in the fingers or toes;positional dizziness; disequilibrium.  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 Body mass index is 31.62 kg/m. Vitals:   01/14/18 1523  BP: (!) 111/97  Pulse: (!) 125  Resp: 19  Temp: 97.6 F (36.4 C)  SpO2: 94%    Filed Weights   01/14/18 1523  Weight: 172 lb 14.4 oz (78.4 kg)  ECOG PERFORMANCE STATUS:0 Constitutional:Katrina Randolph fully nourished and developedalbeit overweight. She looks age appropriate. She is friendly and cooperative without respiratory compromise at rest. Skin: No rashes, scaling, dryness, jaundice, or itching. HEENT: Head is normocephalic and atraumatic. Pupils are equal round and reactive to light and accommodation. Sclerae are anicteric. Conjunctivae are pale.No nystagmus is present.No sinus tenderness nor oropharyngeal lesions. Lips without cracking or peeling; tongue without mass, inflammation, or nodularity. Mucous membranes are moist. Neck: Supple and symmetric. No jugular venous distention or thyromegaly. Trachea is midline. Lymphatics: No cervical or supraclavicular lymphadenopathy. No epitrochlear, axillary, or inguinal lymphadenopathy is appreciated. Respiratory/chest: Thorax is symmetrical. Breath sounds are clear to auscultation and percussion. Normal excursion and respiratory effort. Back: Symmetric  without deformity;she has point tenderness over the lower lumbar region. Cardiovascular: Heart rate and rhythm are regular. Gastrointestinal: Abdomen is soft, nontender; no organomegaly. Bowel sounds are normoactive. No masses are appreciated. Extremities: In the lower extremities, there is no asymmetric swelling, erythema, tenderness, or cord formation. No clubbing, cyanosis, nor edema. Hematologic: No petechiae, hematomas, or ecchymoses. Psychological: She is oriented to person, place, and time; normal affect;memory, andcognitive deficit. Neurological:There are no gross neurologic deficits.  Brief History: Katrina Randolph an 84 year old resident of [place] in [place],[birth year][address]  for major depressive disorder; hyperlipidemia; hypothyroidism; borderline diabetes; primary hypertension; gastroesophageal reflux disease; systolic congestive heart failure; paroxysmal atrial fibrillation; and a history of breast cancer treated approximately 4-7years ago withsurgery,followed by radiation therapy.Although postoperativehormonal therapy was offered, she declined.Theradiation therapy was directedby Dr. Freddi Starr, radiation oncology, at [facility] In her family there is no history of any blood disorders or bleeding tendencies.Those records are not available on the existing chart but have been requested. Her primary care physician is Dr. Lenn Cal.She is accompanied by her attentive daughter Katrina Randolph.  Only recently did she establish care with Dr. Lynelle Doctor. On October 27, 2017 initial laboratory studies were obtained especially given her chronic iron deficiency anemia over several years of unclear etiology. In the past she has received parenteral iron infusions when residing in [facility].She was apparently intolerant of oral iron which caused "gastritis." She cannot recall the name  of her hematologist. Those records arealsonot available on the existing chart.Acomplete blood count September 18showed hemoglobin 9.4 hematocrit 30.8 MCV 74.8 MCH 22.8 RDW 18.2 WBC 6.4 with 75% neutrophils 12% lymphocytes 10% monocytes 2% eosinophils 1% basophils; platelets 247,000. Iron studies from September 16showed ferritin 14.7 serum iron 16 TIBC 564 iron saturation 3%. Hemoglobin A1c 5.8.  [place] is not a vegetarian.She is "borderline diabetic." She has a long-standing history of paroxysmal atrial fibrillation for at least the past 4 years. She has never had a screening colonoscopy. Apparently it was recommended and planned, but never performed. [place] her lumpectomy for 5 consecutive years. Her last mammogram was in February. She has no follow-up mammogram scheduled. Her laboratory studies from September confirmed the presence of iron deficiency. She has however received parenteral iron in the past (2018). Oral iron has not been either tolerated or effective in her. It is with this background she presents now for the results of her lab work from today and recommendations.  Interval History: In the interim since her last visit, both her appetite and weight have increased. Her energy level is improved. She has no unusual headache, dizziness, lightheadedness, syncope, near syncopal episodes. She has dry skin. She has no rash or itching. She denies any bleeding tendency. She reports no chest or abdominal pain. There are no new visual changes or hearing deficit.  She has no pain or difficulty in swallowing.  She denies cough, sore throat, or orthopnea. She has no dyspnea at rest or on exertion. She has chronic but stable exertional dyspnea.No fever, shaking chills, sweats, or flulike symptoms are apparent. She denies heartburn or indigestion. She has no nausea, vomiting, diarrhea, or constipation. There is no urinary urgency,  hematuria, or dysuria. There are no new focal areas of bone, joint, muscle pain. She continues to have pain in the left knee unchanged over her usual baseline. She has no bleeding tendency or easy bruisability. There is no numbness or tingling in the fingers or toes.  Allergies  Allergen Reactions  . Sulfa Antibiotics     "feels bad"    Current Outpatient Medications on File Prior to Visit  Medication Sig  . furosemide (LASIX) 20 MG tablet Take 20 mg by mouth daily. PRN  . levothyroxine (SYNTHROID, LEVOTHROID) 100 MCG tablet TAKE 1 TABLET BY MOUTH ON AN EMPTY STOMACH IN THE MORNING  . lisinopril (PRINIVIL,ZESTRIL) 10 MG tablet Take 10 mg by mouth daily.  Marland Kitchen omeprazole (PRILOSEC) 20 MG capsule Take by mouth daily.  . sertraline (ZOLOFT) 50 MG tablet Take 50 mg by  mouth daily.   No current facility-administered medications on file prior to visit.     Laboratory Studies: January 14, 2018  Ref Range & Units 14:49 3wk ago 29mo ago  WBC Count 4.0 - 10.5 K/uL 6.6  7.7  6.9   RBC 3.87 - 5.11 MIL/uL 5.39High   5.08  4.40   Hemoglobin 12.0 - 15.0 g/dL 13.2  11.8Low   9.3Low  CM  HCT 36.0 - 46.0 % 44.3  41.1  33.1Low    MCV 80.0 - 100.0 fL 82.2  80.9  75.2Low    MCH 26.0 - 34.0 pg 24.5Low   23.2Low   21.1Low    MCHC 30.0 - 36.0 g/dL 29.8Low   28.7Low   28.1Low    RDW 11.5 - 15.5 % 25.3High   27.1High   18.2High    Platelet Count 150 - 400 K/uL 191  219  258   nRBC 0.0 - 0.2 % 0.0  0.0  0.0   Neutrophils Relative % % 75  78  77   Neutro Abs 1.7 - 7.7 K/uL 4.9  6.0  5.4   Lymphocytes Relative % 13  9  11    Lymphs Abs 0.7 - 4.0 K/uL 0.8  0.7  0.7   Monocytes Relative % 9  10  9    Monocytes Absolute 0.1 - 1.0 K/uL 0.6  0.8  0.6   Eosinophils Relative % 2  2  2    Eosinophils Absolute 0.0 - 0.5 K/uL 0.2  0.1  0.1   Basophils Relative % 1  1  1    Basophils Absolute 0.0 - 0.1 K/uL 0.1  0.1  0.1   Immature Granulocytes % 0  0  0   Abs Immature Granulocytes 0.00 - 0.07 K/uL 0.02  0.02 CM 0.03 CM    Ferritin: Pending  December 24, 2017 Ferritin 136  November 24, 2017 Reticulocyte count 0.9% Haptoglobin 83 LDH 140 TSH 5.172 (0.308-3.96) DAT: Negative Vitamin D35 701 Folic acid 77.9 Ferritin 10  Assessment/Summary: Iron deficiency anemia Status post parenteral iron on December 03, 2017 HCT: 33.1 to 41.1% December 24, 2017 HCT: 41.1 to 44.3% January 14, 2018 She is clinically improved.  Recommendation/Plan: We discussed the results of her laboratory studies from today. Those results are detailed above; hemoglobin 13.2 g/dL. It was recommended again that she discuss endoscopic evaluation with Dr. Lynelle Doctor. Until proven otherwise, she has a gastrointestinal source of bleeding. The borderline thyroid dysfunction, to be managed by Dr. Lynelle Doctor. She has a history of breast cancer treated with lumpectomy and radiation. In [place], she declined endocrine therapy post radiation. A surveillance bilateral diagnostic mammogram was strongly recommended. She said that she would have it obtained through Dr. Lynelle Doctor. She was told she was "cured after 5 years." Doctor visit and repeat iron studies on February 25, 2018 She was advised that another provider will be taking over her care since I am leaving the practice. There was ample time allotted to answer all questions.  This note was dictated using voice activated technology/software.  Unfortunately, typographical errors are not uncommon, and transcription is subject to mistakes and regrettably misinterpretation.  If necessary, clarification of the above information can be discussed with me at any time.  FOLLOW UP: AS DIRECTED   TJ:QZESP Via MD   Henreitta Leber, MD  Hematology/Oncology [facility] [address] [place], [place] [address] Office: [phone] TGYB: [phone]

## 2018-01-15 LAB — FERRITIN: Ferritin: 40 ng/mL (ref 11–307)

## 2018-02-02 ENCOUNTER — Telehealth: Payer: Self-pay | Admitting: Hematology and Oncology

## 2018-02-02 NOTE — Telephone Encounter (Signed)
Called patient in regards to scheduled appointments, was not able to leave a VM.  Printed and mailed calendar.

## 2018-02-25 ENCOUNTER — Other Ambulatory Visit: Payer: Medicare Other

## 2018-02-26 ENCOUNTER — Inpatient Hospital Stay: Payer: Medicare Other

## 2018-02-26 ENCOUNTER — Inpatient Hospital Stay: Payer: Medicare Other | Admitting: Internal Medicine

## 2018-03-16 ENCOUNTER — Ambulatory Visit: Payer: Medicare Other | Admitting: Nurse Practitioner

## 2018-03-16 ENCOUNTER — Other Ambulatory Visit: Payer: Medicare Other

## 2019-10-12 ENCOUNTER — Other Ambulatory Visit: Payer: Self-pay

## 2019-10-12 ENCOUNTER — Emergency Department (HOSPITAL_COMMUNITY): Payer: Medicare Other

## 2019-10-12 ENCOUNTER — Encounter (HOSPITAL_COMMUNITY): Payer: Self-pay

## 2019-10-12 ENCOUNTER — Inpatient Hospital Stay (HOSPITAL_COMMUNITY)
Admission: EM | Admit: 2019-10-12 | Discharge: 2019-10-20 | DRG: 193 | Disposition: A | Discharge status: Home Health | Payer: Medicare Other | Source: Ambulatory Visit | Attending: Internal Medicine | Admitting: Internal Medicine

## 2019-10-12 DIAGNOSIS — E875 Hyperkalemia: Secondary | ICD-10-CM | POA: Diagnosis present

## 2019-10-12 DIAGNOSIS — K648 Other hemorrhoids: Secondary | ICD-10-CM | POA: Diagnosis present

## 2019-10-12 DIAGNOSIS — K5730 Diverticulosis of large intestine without perforation or abscess without bleeding: Secondary | ICD-10-CM | POA: Diagnosis present

## 2019-10-12 DIAGNOSIS — E669 Obesity, unspecified: Secondary | ICD-10-CM | POA: Diagnosis present

## 2019-10-12 DIAGNOSIS — Z66 Do not resuscitate: Secondary | ICD-10-CM | POA: Diagnosis present

## 2019-10-12 DIAGNOSIS — I4891 Unspecified atrial fibrillation: Secondary | ICD-10-CM

## 2019-10-12 DIAGNOSIS — Z87891 Personal history of nicotine dependence: Secondary | ICD-10-CM

## 2019-10-12 DIAGNOSIS — K219 Gastro-esophageal reflux disease without esophagitis: Secondary | ICD-10-CM | POA: Diagnosis present

## 2019-10-12 DIAGNOSIS — R627 Adult failure to thrive: Secondary | ICD-10-CM | POA: Diagnosis present

## 2019-10-12 DIAGNOSIS — I480 Paroxysmal atrial fibrillation: Secondary | ICD-10-CM | POA: Diagnosis present

## 2019-10-12 DIAGNOSIS — I110 Hypertensive heart disease with heart failure: Secondary | ICD-10-CM | POA: Diagnosis present

## 2019-10-12 DIAGNOSIS — Z20822 Contact with and (suspected) exposure to covid-19: Secondary | ICD-10-CM | POA: Diagnosis present

## 2019-10-12 DIAGNOSIS — M542 Cervicalgia: Secondary | ICD-10-CM | POA: Diagnosis not present

## 2019-10-12 DIAGNOSIS — D6869 Other thrombophilia: Secondary | ICD-10-CM | POA: Diagnosis present

## 2019-10-12 DIAGNOSIS — I5042 Chronic combined systolic (congestive) and diastolic (congestive) heart failure: Secondary | ICD-10-CM | POA: Diagnosis present

## 2019-10-12 DIAGNOSIS — R42 Dizziness and giddiness: Secondary | ICD-10-CM | POA: Diagnosis not present

## 2019-10-12 DIAGNOSIS — I10 Essential (primary) hypertension: Secondary | ICD-10-CM | POA: Diagnosis not present

## 2019-10-12 DIAGNOSIS — Z6833 Body mass index (BMI) 33.0-33.9, adult: Secondary | ICD-10-CM | POA: Diagnosis not present

## 2019-10-12 DIAGNOSIS — E039 Hypothyroidism, unspecified: Secondary | ICD-10-CM | POA: Diagnosis present

## 2019-10-12 DIAGNOSIS — I951 Orthostatic hypotension: Secondary | ICD-10-CM | POA: Diagnosis not present

## 2019-10-12 DIAGNOSIS — K449 Diaphragmatic hernia without obstruction or gangrene: Secondary | ICD-10-CM | POA: Diagnosis present

## 2019-10-12 DIAGNOSIS — D649 Anemia, unspecified: Secondary | ICD-10-CM

## 2019-10-12 DIAGNOSIS — Z9181 History of falling: Secondary | ICD-10-CM

## 2019-10-12 DIAGNOSIS — Z79890 Hormone replacement therapy (postmenopausal): Secondary | ICD-10-CM

## 2019-10-12 DIAGNOSIS — J189 Pneumonia, unspecified organism: Secondary | ICD-10-CM | POA: Diagnosis not present

## 2019-10-12 DIAGNOSIS — K635 Polyp of colon: Secondary | ICD-10-CM | POA: Diagnosis present

## 2019-10-12 DIAGNOSIS — I482 Chronic atrial fibrillation, unspecified: Secondary | ICD-10-CM | POA: Diagnosis not present

## 2019-10-12 DIAGNOSIS — D509 Iron deficiency anemia, unspecified: Secondary | ICD-10-CM | POA: Diagnosis present

## 2019-10-12 DIAGNOSIS — K259 Gastric ulcer, unspecified as acute or chronic, without hemorrhage or perforation: Secondary | ICD-10-CM | POA: Diagnosis present

## 2019-10-12 DIAGNOSIS — J9600 Acute respiratory failure, unspecified whether with hypoxia or hypercapnia: Secondary | ICD-10-CM

## 2019-10-12 DIAGNOSIS — J181 Lobar pneumonia, unspecified organism: Principal | ICD-10-CM | POA: Diagnosis present

## 2019-10-12 DIAGNOSIS — I361 Nonrheumatic tricuspid (valve) insufficiency: Secondary | ICD-10-CM | POA: Diagnosis not present

## 2019-10-12 DIAGNOSIS — Z8249 Family history of ischemic heart disease and other diseases of the circulatory system: Secondary | ICD-10-CM

## 2019-10-12 DIAGNOSIS — I340 Nonrheumatic mitral (valve) insufficiency: Secondary | ICD-10-CM | POA: Diagnosis not present

## 2019-10-12 DIAGNOSIS — K222 Esophageal obstruction: Secondary | ICD-10-CM | POA: Diagnosis present

## 2019-10-12 DIAGNOSIS — J9601 Acute respiratory failure with hypoxia: Secondary | ICD-10-CM | POA: Diagnosis not present

## 2019-10-12 DIAGNOSIS — J13 Pneumonia due to Streptococcus pneumoniae: Secondary | ICD-10-CM | POA: Diagnosis not present

## 2019-10-12 DIAGNOSIS — D539 Nutritional anemia, unspecified: Secondary | ICD-10-CM | POA: Diagnosis not present

## 2019-10-12 DIAGNOSIS — I429 Cardiomyopathy, unspecified: Secondary | ICD-10-CM | POA: Diagnosis present

## 2019-10-12 DIAGNOSIS — Z79899 Other long term (current) drug therapy: Secondary | ICD-10-CM

## 2019-10-12 HISTORY — DX: Unspecified atrial fibrillation: I48.91

## 2019-10-12 LAB — MRSA PCR SCREENING: MRSA by PCR: POSITIVE — AB

## 2019-10-12 LAB — CBC WITH DIFFERENTIAL/PLATELET
Abs Immature Granulocytes: 0.03 10*3/uL (ref 0.00–0.07)
Basophils Absolute: 0.1 10*3/uL (ref 0.0–0.1)
Basophils Relative: 1 %
Eosinophils Absolute: 0.4 10*3/uL (ref 0.0–0.5)
Eosinophils Relative: 5 %
HCT: 33.6 % — ABNORMAL LOW (ref 36.0–46.0)
Hemoglobin: 9.5 g/dL — ABNORMAL LOW (ref 12.0–15.0)
Immature Granulocytes: 0 %
Lymphocytes Relative: 8 %
Lymphs Abs: 0.6 10*3/uL — ABNORMAL LOW (ref 0.7–4.0)
MCH: 21.6 pg — ABNORMAL LOW (ref 26.0–34.0)
MCHC: 28.3 g/dL — ABNORMAL LOW (ref 30.0–36.0)
MCV: 76.5 fL — ABNORMAL LOW (ref 80.0–100.0)
Monocytes Absolute: 0.6 10*3/uL (ref 0.1–1.0)
Monocytes Relative: 8 %
Neutro Abs: 6 10*3/uL (ref 1.7–7.7)
Neutrophils Relative %: 78 %
Platelets: 239 10*3/uL (ref 150–400)
RBC: 4.39 MIL/uL (ref 3.87–5.11)
RDW: 20.1 % — ABNORMAL HIGH (ref 11.5–15.5)
WBC: 7.7 10*3/uL (ref 4.0–10.5)
nRBC: 0 % (ref 0.0–0.2)

## 2019-10-12 LAB — COMPREHENSIVE METABOLIC PANEL
ALT: 14 U/L (ref 0–44)
AST: 21 U/L (ref 15–41)
Albumin: 3.9 g/dL (ref 3.5–5.0)
Alkaline Phosphatase: 63 U/L (ref 38–126)
Anion gap: 11 (ref 5–15)
BUN: 18 mg/dL (ref 8–23)
CO2: 20 mmol/L — ABNORMAL LOW (ref 22–32)
Calcium: 9.1 mg/dL (ref 8.9–10.3)
Chloride: 112 mmol/L — ABNORMAL HIGH (ref 98–111)
Creatinine, Ser: 0.95 mg/dL (ref 0.44–1.00)
GFR calc Af Amer: >60 mL/min (ref >60)
GFR calc non Af Amer: 54 mL/min — ABNORMAL LOW (ref >60)
Glucose, Bld: 105 mg/dL — ABNORMAL HIGH (ref 70–99)
Potassium: 4.9 mmol/L (ref 3.5–5.1)
Sodium: 143 mmol/L (ref 135–145)
Total Bilirubin: 1.2 mg/dL (ref 0.3–1.2)
Total Protein: 7.5 g/dL (ref 6.5–8.1)

## 2019-10-12 LAB — SARS CORONAVIRUS 2 BY RT PCR (HOSPITAL ORDER, PERFORMED IN ~~LOC~~ HOSPITAL LAB): SARS Coronavirus 2: NEGATIVE

## 2019-10-12 LAB — TROPONIN I (HIGH SENSITIVITY)
Troponin I (High Sensitivity): 10 ng/L (ref <18)
Troponin I (High Sensitivity): 10 ng/L (ref <18)

## 2019-10-12 LAB — LACTIC ACID, PLASMA: Lactic Acid, Venous: 1.4 mmol/L (ref 0.5–1.9)

## 2019-10-12 LAB — TSH: TSH: 7.954 u[IU]/mL — ABNORMAL HIGH (ref 0.350–4.500)

## 2019-10-12 LAB — BRAIN NATRIURETIC PEPTIDE: B Natriuretic Peptide: 344.5 pg/mL — ABNORMAL HIGH (ref 0.0–100.0)

## 2019-10-12 LAB — MAGNESIUM: Magnesium: 2.2 mg/dL (ref 1.7–2.4)

## 2019-10-12 MED ORDER — ACETAMINOPHEN 325 MG PO TABS
650.0000 mg | ORAL_TABLET | Freq: Four times a day (QID) | ORAL | Status: DC | PRN
  Administered 2019-10-16 – 2019-10-19 (×6): 650 mg via ORAL
  Filled 2019-10-12 (×7): qty 2

## 2019-10-12 MED ORDER — SERTRALINE HCL 50 MG PO TABS
50.0000 mg | ORAL_TABLET | Freq: Every day | ORAL | Status: DC
  Administered 2019-10-13 – 2019-10-20 (×8): 50 mg via ORAL
  Filled 2019-10-12 (×8): qty 1

## 2019-10-12 MED ORDER — ACETAMINOPHEN 650 MG RE SUPP: 650.0000 mg | Freq: Four times a day (QID) | RECTAL | Status: DC | PRN

## 2019-10-12 MED ORDER — MUPIROCIN 2 % EX OINT
1.0000 "application " | TOPICAL_OINTMENT | Freq: Two times a day (BID) | CUTANEOUS | Status: AC
  Administered 2019-10-12 – 2019-10-17 (×10): 1 via NASAL
  Filled 2019-10-12 (×3): qty 22

## 2019-10-12 MED ORDER — LEVOTHYROXINE SODIUM 100 MCG PO TABS
100.0000 ug | ORAL_TABLET | Freq: Every day | ORAL | Status: DC
  Administered 2019-10-13 – 2019-10-17 (×5): 100 ug via ORAL
  Filled 2019-10-12 (×5): qty 1

## 2019-10-12 MED ORDER — SODIUM CHLORIDE 0.9 % IV SOLN: INTRAVENOUS | Status: DC

## 2019-10-12 MED ORDER — FUROSEMIDE 20 MG PO TABS: 20.0000 mg | ORAL_TABLET | Freq: Every day | ORAL | Status: DC | PRN

## 2019-10-12 MED ORDER — ORAL CARE MOUTH RINSE
15.0000 mL | Freq: Two times a day (BID) | OROMUCOSAL | Status: DC
  Administered 2019-10-12 – 2019-10-20 (×13): 15 mL via OROMUCOSAL

## 2019-10-12 MED ORDER — DILTIAZEM LOAD VIA INFUSION
20.0000 mg | Freq: Once | INTRAVENOUS | Status: AC
  Administered 2019-10-12: 20 mg via INTRAVENOUS
  Filled 2019-10-12: qty 20

## 2019-10-12 MED ORDER — CHLORHEXIDINE GLUCONATE CLOTH 2 % EX PADS
6.0000 | MEDICATED_PAD | Freq: Every day | CUTANEOUS | Status: DC
  Administered 2019-10-14: 6 via TOPICAL

## 2019-10-12 MED ORDER — SODIUM CHLORIDE 0.9 % IV SOLN
2.0000 g | INTRAVENOUS | Status: DC
  Administered 2019-10-12: 2 g via INTRAVENOUS
  Filled 2019-10-12: qty 20

## 2019-10-12 MED ORDER — SODIUM CHLORIDE 0.9 % IV SOLN
2.0000 g | INTRAVENOUS | Status: DC
  Administered 2019-10-13: 2 g via INTRAVENOUS
  Filled 2019-10-12: qty 20
  Filled 2019-10-12: qty 2

## 2019-10-12 MED ORDER — TRAMADOL HCL 50 MG PO TABS
50.0000 mg | ORAL_TABLET | Freq: Four times a day (QID) | ORAL | Status: DC | PRN
  Administered 2019-10-12 – 2019-10-15 (×4): 50 mg via ORAL
  Filled 2019-10-12 (×4): qty 1

## 2019-10-12 MED ORDER — LISINOPRIL 10 MG PO TABS
10.0000 mg | ORAL_TABLET | Freq: Every day | ORAL | Status: DC
  Administered 2019-10-13: 10 mg via ORAL
  Filled 2019-10-12: qty 1

## 2019-10-12 MED ORDER — TRAZODONE HCL 50 MG PO TABS
50.0000 mg | ORAL_TABLET | Freq: Every evening | ORAL | Status: DC | PRN
  Administered 2019-10-12 – 2019-10-19 (×2): 50 mg via ORAL
  Filled 2019-10-12 (×2): qty 1

## 2019-10-12 MED ORDER — SODIUM CHLORIDE 0.9 % IV SOLN
500.0000 mg | INTRAVENOUS | Status: DC
  Administered 2019-10-13: 500 mg via INTRAVENOUS
  Filled 2019-10-12: qty 500

## 2019-10-12 MED ORDER — ENOXAPARIN SODIUM 40 MG/0.4ML ~~LOC~~ SOLN
40.0000 mg | SUBCUTANEOUS | Status: DC
  Administered 2019-10-12 – 2019-10-16 (×5): 40 mg via SUBCUTANEOUS
  Filled 2019-10-12 (×5): qty 0.4

## 2019-10-12 MED ORDER — CHLORHEXIDINE GLUCONATE CLOTH 2 % EX PADS: 6.0000 | MEDICATED_PAD | Freq: Every day | CUTANEOUS | Status: DC

## 2019-10-12 MED ORDER — SODIUM CHLORIDE 0.9 % IV SOLN
500.0000 mg | INTRAVENOUS | Status: DC
  Administered 2019-10-12: 500 mg via INTRAVENOUS
  Filled 2019-10-12: qty 500

## 2019-10-12 MED ORDER — DILTIAZEM HCL-DEXTROSE 125-5 MG/125ML-% IV SOLN (PREMIX)
5.0000 mg/h | INTRAVENOUS | Status: DC
  Administered 2019-10-12: 7.5 mg/h via INTRAVENOUS
  Administered 2019-10-12: 5 mg/h via INTRAVENOUS
  Administered 2019-10-13: 7.5 mg/h via INTRAVENOUS
  Filled 2019-10-12 (×2): qty 125

## 2019-10-12 NOTE — H&P (Signed)
History and Physical    Katrina Randolph TGG:[id number] DOB: 1930/05/18 DOA: 10/12/2019  PCP: [name], PA-C  Patient coming from: Home  Chief Complaint: SOB  HPI: Katrina Randolph is a 86 y.o. female with medical history significant of chronic A. fib over the past 10 years, hypothyroid on thyroid replacement, history of anemia requiring blood transfusions who presents to the emergency department with 2-week history of shortness of breath associated with some chest discomfort made worse with mild to moderate exertion.  Patient moved to [place] recently from [place] and [place].  Patient reports a 10-year history of atrial fibrillation previously well controlled.  Patient reports being instructed to stop anticoagulation 5 years ago secondary to recurrent transfusion dependent anemia for which no evidence of acute blood loss could be identified.  Patient did see a hematologist who ultimately recommended holding further therapeutic anticoagulation in the future.  For the past 2 weeks, patient has been complaining of shortness of breath primarily with exertion.  Patient followed up with her primary care provider on the afternoon of hospital presentation where she was found to have a heart rate in excess of 160 bpm.  Patient was subsequently referred to the emergency department for further work-up.  On further questioning, patient does report a chronic dry cough which patient attributes to her lisinopril.  Patient denies fevers chills or sweats.  Denies sputum production.  ED Course: In the emergency department, patient was noted to have heart rate in the 120s to 150s.  Potassium noted to be 4.9, sodium 143, retinae 0.95.  WBC noted to be 7.7 with hemoglobin 9.5 and platelet count of 239.  Patient was started on Cardizem drip with rate controlled.  Chest x-ray noted a left lower lobe infiltrate.  Patient was given 1 dose of Rocephin and  azithromycin.  Hospitalist service consulted for consideration for medical admission.  Review of Systems:  Review of Systems  Constitutional: Negative for chills, fever and malaise/fatigue.  HENT: Negative for congestion, ear discharge and sinus pain.   Eyes: Negative for photophobia, pain and discharge.  Respiratory: Positive for cough and shortness of breath. Negative for hemoptysis and sputum production.   Cardiovascular: Positive for chest pain and palpitations. Negative for claudication.  Gastrointestinal: Negative for abdominal pain, nausea and vomiting.  Genitourinary: Negative for frequency and urgency.  Musculoskeletal: Negative for back pain, falls and joint pain.  Neurological: Negative for speech change, focal weakness, seizures and loss of consciousness.    Past Medical History:  Diagnosis Date  . Atrial fibrillation [facility])     Past Surgical History:  Procedure Laterality Date  . ABDOMINAL HYSTERECTOMY    . BREAST LUMPECTOMY    . CHOLECYSTECTOMY       reports that she has quit smoking. She has never used smokeless tobacco. She reports current alcohol use. She reports that she does not use drugs.  No Known Allergies  Family hx Father with hypertension  Prior to Admission medications   Not on File    Physical Exam: Vitals:   10/12/19 1645 10/12/19 1700 10/12/19 1715 10/12/19 1730  BP: 137/69 (!) 121/38 114/88 (!) 108/91  Pulse: 91 89 (!) 127 91  Resp: 20 20 (!) 21 20  Temp:      TempSrc:      SpO2: 96% 100% 98% 97%  Weight:      Height:        Constitutional: NAD, calm, comfortable Vitals:   10/12/19 1645 10/12/19  1700 10/12/19 1715 10/12/19 1730  BP: 137/69 (!) 121/38 114/88 (!) 108/91  Pulse: 91 89 (!) 127 91  Resp: 20 20 (!) 21 20  Temp:      TempSrc:      SpO2: 96% 100% 98% 97%  Weight:      Height:       Eyes: PERRL, lids and conjunctivae normal ENMT: External ear appears normal, head atraumatic Neck: normal, supple, no masses, trachea  midline Respiratory: clear to auscultation bilaterally, no wheezing, no crackles. Normal respiratory effort. No accessory muscle use.  Cardiovascular: Irregularly irregular, S1-S2 Abdomen: Soft, nontender, nondistended Musculoskeletal: no clubbing / cyanosis. No joint deformity upper and lower extremities. Good ROM, no contractures. Normal muscle tone.  Skin: no rashes, lesions, Neurologic: CN 2-12 grossly intact. Sensation intact,. Strength 5/5 in all 4.  Psychiatric: Normal judgment and insight. Alert and oriented x 3. Normal mood.    Labs on Admission: I have personally reviewed following labs and imaging studies  CBC: Recent Labs  Lab 10/12/19 1427  WBC 7.7  NEUTROABS 6.0  HGB 9.5*  HCT 33.6*  MCV 76.5*  PLT 144   Basic Metabolic Panel: Recent Labs  Lab 10/12/19 1427  NA 143  K 4.9  CL 112*  CO2 20*  GLUCOSE 105*  BUN 18  CREATININE 0.95  CALCIUM 9.1   GFR: Estimated Creatinine Clearance: 40.1 mL/min (by C-G formula based on SCr of 0.95 mg/dL). Liver Function Tests: Recent Labs  Lab 10/12/19 1427  AST 21  ALT 14  ALKPHOS 63  BILITOT 1.2  PROT 7.5  ALBUMIN 3.9   No results for input(s): LIPASE, AMYLASE in the last 168 hours. No results for input(s): AMMONIA in the last 168 hours. Coagulation Profile: No results for input(s): INR, PROTIME in the last 168 hours. Cardiac Enzymes: No results for input(s): CKTOTAL, CKMB, CKMBINDEX, TROPONINI in the last 168 hours. BNP (last 3 results) No results for input(s): PROBNP in the last 8760 hours. HbA1C: No results for input(s): HGBA1C in the last 72 hours. CBG: No results for input(s): GLUCAP in the last 168 hours. Lipid Profile: No results for input(s): CHOL, HDL, LDLCALC, TRIG, CHOLHDL, LDLDIRECT in the last 72 hours. Thyroid Function Tests: No results for input(s): TSH, T4TOTAL, FREET4, T3FREE, THYROIDAB in the last 72 hours. Anemia Panel: No results for input(s): VITAMINB12, FOLATE, FERRITIN, TIBC, IRON,  RETICCTPCT in the last 72 hours. Urine analysis: No results found for: COLORURINE, APPEARANCEUR, LABSPEC, PHURINE, GLUCOSEU, HGBUR, BILIRUBINUR, KETONESUR, PROTEINUR, UROBILINOGEN, NITRITE, LEUKOCYTESUR Sepsis Labs: !!!!!!!!!!!!!!!!!!!!!!!!!!!!!!!!!!!!!!!!!!!! @LABRCNTIP (procalcitonin:4,lacticidven:4) )No results found for this or any previous visit (from the past 240 hour(s)).   Radiological Exams on Admission: DG Chest Port 1 View  Result Date: 10/12/2019 CLINICAL DATA:  Worsened shortness of breath and dizziness over the last 2 weeks. EXAM: PORTABLE CHEST 1 VIEW COMPARISON:  None. FINDINGS: Mild cardiomegaly. Aortic atherosclerotic calcification. The right lung is clear. There is infiltrate and volume loss in the left lower lobe consistent with pneumonia. Possible small amount of pleural fluid on the left. No significant bone finding. IMPRESSION: 1. Left lower lobe pneumonia and volume loss. Possible small amount of pleural fluid on the left. 2. Mild cardiomegaly. Aortic atherosclerosis. Electronically Signed   By: Nelson Chimes M.D.   On: 10/12/2019 15:07    EKG: Independently reviewed. Afib RVR  Assessment/Plan Principal Problem:   Rapid atrial fibrillation (HCC) Active Problems:   Pneumonia   Anemia requiring transfusions   DNR (do not resuscitate)   Acquired thrombophilia ([place])  1. A. fib with RVR 1. Presents from PCP office with rapid A. fib, heart rate reportedly in the 160s as outpatient 2. In ED, heart rate noted to be 120s to 150s, ultimately rate controlled after starting Cardizem drip 3. Initial troponin is negative follow serial troponin 4. 2D echocardiogram ordered 5. We will check TSH 6. See above, patient has a history of recurrent transfusion dependent anemia for which no source of acute blood loss could be identified.  Historically, patient had been recommended to avoid anticoagulation and in fact, anticoagulation was discontinued 5 years ago for this reason. 7. We  will continue patient on prophylactic dose of Lovenox for now 8. Repeat comprehensive metabolic panel and CBC in the morning 2. Pneumonia  1. Chest x-ray personally reviewed, findings notable for left lower lobe infiltrate with volume loss and a small left pleural effusion. 2. Currently afebrile with no leukocytosis 3. Empiric azithromycin and Rocephin started in the emergency department, will continue 3. Acquired thrombophilia 1. Secondary to above atrial fibrillation 2. Per above, continue on prophylactic Lovenox for now.  Historically unable to tolerate therapeutic anticoagulation secondary to recurrent transfusion dependent anemia 4. Hypertension 1. Blood pressure presently stable 2. Continue home regimen as tolerated 5. Hypothyroidism 1. Continue thyroid replacement per home regimen 2. We will check TSH as per above 6. Microcytic anemia 1. Patient has a history of recurrent transfusion dependent anemia 2. Hemoglobin 9.5 currently, will repeat CBC in the morning 3. We will continue only on prophylactic dose Lovenox for now  DVT prophylaxis: Prophylactic Lovenox  Code Status: DNR, confirmed with patient with family at bedside Family Communication: Pt in room  Disposition Plan:   Consults called:  Admission status: Inpatient as it would likely require greater than 2 midnight stay to treat A. fib RVR and continue IV antibiotics for newly diagnosed pneumonia in an elderly patient  Marylu Lund MD [facility] Pager On Amion  If 7PM-7AM, please contact night-coverage  10/12/2019, 5:33 PM

## 2019-10-12 NOTE — ED Provider Notes (Signed)
[facility] DEPT Provider Note   CSN: [id number] Arrival date & time: 10/12/19  1357     History Chief Complaint  Patient presents with  . Dizziness  . Shortness of Breath  . Atrial Fibrillation    Katrina Randolph is a 86 y.o. female.  86 year old female with history of paroxysmal A. fib, CHF who presents with increasing dyspnea times several days.  Patient states that she has symptoms started about 2 weeks ago and that she started having palpitations which have been intermittent.  She cannot currently tell me when she noted her last current symptoms.  No fever or chills.  No cough or congestion.  Symptoms are better with rest.  Has had some orthopnea as well as swelling in her lower extremities.  Went to her doctor's office today was found to be in A. fib with RVR.  Patient does not take any anticoagulants.  Was sent in for further evaluation        Past Medical History:  Diagnosis Date  . Atrial fibrillation ([place])     There are no problems to display for this patient.   Past Surgical History:  Procedure Laterality Date  . ABDOMINAL HYSTERECTOMY    . BREAST LUMPECTOMY    . CHOLECYSTECTOMY       OB History   No obstetric history on file.     History reviewed. No pertinent family history.  Social History   Tobacco Use  . Smoking status: Former Research scientist (life sciences)  . Smokeless tobacco: Never Used  Vaping Use  . Vaping Use: Never used  Substance Use Topics  . Alcohol use: Yes  . Drug use: Never    Home Medications Prior to Admission medications   Not on File    Allergies    Patient has no known allergies.  Review of Systems   Review of Systems  All other systems reviewed and are negative.   Physical Exam Updated Vital Signs BP 131/89 (BP Location: Left Arm)   Pulse (!) 120   Temp 97.6 F (36.4 C) (Oral)   Resp 16   Ht 1.575 m (5\' 2" )   Wt 77.1 kg   SpO2 94%   BMI 31.09 kg/m   Physical Exam Vitals and nursing note  reviewed.  Constitutional:      General: She is not in acute distress.    Appearance: Normal appearance. She is well-developed. She is not toxic-appearing.  HENT:     Head: Normocephalic and atraumatic.  Eyes:     General: Lids are normal.     Conjunctiva/sclera: Conjunctivae normal.     Pupils: Pupils are equal, round, and reactive to light.  Neck:     Thyroid: No thyroid mass.     Trachea: No tracheal deviation.  Cardiovascular:     Rate and Rhythm: Tachycardia present. Rhythm irregularly irregular.     Heart sounds: Normal heart sounds. No murmur heard.  No gallop.   Pulmonary:     Effort: Pulmonary effort is normal. No respiratory distress.     Breath sounds: Normal breath sounds. No stridor. No decreased breath sounds, wheezing, rhonchi or rales.  Abdominal:     General: Bowel sounds are normal. There is no distension.     Palpations: Abdomen is soft.     Tenderness: There is no abdominal tenderness. There is no rebound.  Musculoskeletal:        General: No tenderness. Normal range of motion.     Cervical back: Normal range of  motion and neck supple.     Comments: 3+ bilateral lower extremity pitting edema  Skin:    General: Skin is warm and dry.     Findings: No abrasion or rash.  Neurological:     Mental Status: She is alert and oriented to person, place, and time.     GCS: GCS eye subscore is 4. GCS verbal subscore is 5. GCS motor subscore is 6.     Cranial Nerves: No cranial nerve deficit.     Sensory: No sensory deficit.  Psychiatric:        Speech: Speech normal.        Behavior: Behavior normal.     ED Results / Procedures / Treatments   Labs (all labs ordered are listed, but only abnormal results are displayed) Labs Reviewed  COMPREHENSIVE METABOLIC PANEL  CBC WITH DIFFERENTIAL/PLATELET  BRAIN NATRIURETIC PEPTIDE  TROPONIN I (HIGH SENSITIVITY)    EKG EKG Interpretation  Date/Time:  Tuesday October 12 2019 14:28:12 EDT Ventricular Rate:  147 PR  Interval:    QRS Duration: 58 QT Interval:  351 QTC Calculation: 549 R Axis:   64 Text Interpretation: Atrial fibrillation with rapid V-rate Low voltage, extremity leads Repolarization abnormality, prob rate related No old tracing to compare Confirmed by Lacretia Leigh ([id number]) on 10/12/2019 2:34:20 PM   Radiology No results found.  Procedures Procedures (including critical care time)  Medications Ordered in ED Medications  0.9 %  sodium chloride infusion (has no administration in time range)  diltiazem (CARDIZEM) 1 mg/mL load via infusion 20 mg (has no administration in time range)    And  diltiazem (CARDIZEM) 125 mg in dextrose 5% 125 mL (1 mg/mL) infusion (has no administration in time range)    ED Course  I have reviewed the triage vital signs and the nursing notes.  Pertinent labs & imaging results that were available during my care of the patient were reviewed by me and considered in my medical decision making (see chart for details).    MDM Rules/Calculators/A&P                          Patient presented initially with A. fib with RVR and was loaded with Cardizem and placed on a drip.  Heart rate improved.  X-ray shows pneumonia.  Started on IV antibiotics and will admit to the hospital service  CRITICAL CARE Performed by: Leota Jacobsen Total critical care time: 50 minutes Critical care time was exclusive of separately billable procedures and treating other patients. Critical care was necessary to treat or prevent imminent or life-threatening deterioration. Critical care was time spent personally by me on the following activities: development of treatment plan with patient and/or surrogate as well as nursing, discussions with consultants, evaluation of patient's response to treatment, examination of patient, obtaining history from patient or surrogate, ordering and performing treatments and interventions, ordering and review of laboratory studies, ordering and review of  radiographic studies, pulse oximetry and re-evaluation of patient's condition.  Final Clinical Impression(s) / ED Diagnoses Final diagnoses:  None    Rx / DC Orders ED Discharge Orders    None       Lacretia Leigh, MD 10/12/19 1547

## 2019-10-12 NOTE — ED Triage Notes (Signed)
Patient c/o increased SOB and dizziness x 2 weeks. Patient states she went to her PCP today and was told to come to the ED. Patient's HR-163 at the PCP office.

## 2019-10-12 NOTE — ED Notes (Signed)
Attempted report. RN to call back 

## 2019-10-13 ENCOUNTER — Telehealth: Payer: Self-pay | Admitting: General Practice

## 2019-10-13 ENCOUNTER — Inpatient Hospital Stay (HOSPITAL_COMMUNITY): Payer: Medicare Other

## 2019-10-13 DIAGNOSIS — Z66 Do not resuscitate: Secondary | ICD-10-CM

## 2019-10-13 DIAGNOSIS — D539 Nutritional anemia, unspecified: Secondary | ICD-10-CM

## 2019-10-13 DIAGNOSIS — I361 Nonrheumatic tricuspid (valve) insufficiency: Secondary | ICD-10-CM

## 2019-10-13 DIAGNOSIS — J181 Lobar pneumonia, unspecified organism: Principal | ICD-10-CM

## 2019-10-13 DIAGNOSIS — I340 Nonrheumatic mitral (valve) insufficiency: Secondary | ICD-10-CM

## 2019-10-13 DIAGNOSIS — I4891 Unspecified atrial fibrillation: Secondary | ICD-10-CM

## 2019-10-13 LAB — COMPREHENSIVE METABOLIC PANEL
ALT: 13 U/L (ref 0–44)
AST: 15 U/L (ref 15–41)
Albumin: 3.2 g/dL — ABNORMAL LOW (ref 3.5–5.0)
Alkaline Phosphatase: 53 U/L (ref 38–126)
Anion gap: 10 (ref 5–15)
BUN: 17 mg/dL (ref 8–23)
CO2: 20 mmol/L — ABNORMAL LOW (ref 22–32)
Calcium: 8.3 mg/dL — ABNORMAL LOW (ref 8.9–10.3)
Chloride: 111 mmol/L (ref 98–111)
Creatinine, Ser: 0.84 mg/dL (ref 0.44–1.00)
GFR calc Af Amer: >60 mL/min (ref >60)
GFR calc non Af Amer: >60 mL/min (ref >60)
Glucose, Bld: 102 mg/dL — ABNORMAL HIGH (ref 70–99)
Potassium: 4.6 mmol/L (ref 3.5–5.1)
Sodium: 141 mmol/L (ref 135–145)
Total Bilirubin: 1.2 mg/dL (ref 0.3–1.2)
Total Protein: 6.4 g/dL — ABNORMAL LOW (ref 6.5–8.1)

## 2019-10-13 LAB — ECHOCARDIOGRAM COMPLETE
Area-P 1/2: 4.15 cm2
Height: 62 in
MV M vel: 4.68 m/s
MV Peak grad: 87.6 mmHg
S' Lateral: 3.9 cm
Weight: 2892.44 oz

## 2019-10-13 LAB — CBC
HCT: 30.4 % — ABNORMAL LOW (ref 36.0–46.0)
Hemoglobin: 8.2 g/dL — ABNORMAL LOW (ref 12.0–15.0)
MCH: 21.3 pg — ABNORMAL LOW (ref 26.0–34.0)
MCHC: 27 g/dL — ABNORMAL LOW (ref 30.0–36.0)
MCV: 79 fL — ABNORMAL LOW (ref 80.0–100.0)
Platelets: 193 10*3/uL (ref 150–400)
RBC: 3.85 MIL/uL — ABNORMAL LOW (ref 3.87–5.11)
RDW: 20.3 % — ABNORMAL HIGH (ref 11.5–15.5)
WBC: 7.1 10*3/uL (ref 4.0–10.5)
nRBC: 0 % (ref 0.0–0.2)

## 2019-10-13 LAB — HIV ANTIBODY (ROUTINE TESTING W REFLEX): HIV Screen 4th Generation wRfx: NONREACTIVE

## 2019-10-13 MED ORDER — LOSARTAN POTASSIUM 25 MG PO TABS
25.0000 mg | ORAL_TABLET | Freq: Every day | ORAL | Status: DC
  Administered 2019-10-13 – 2019-10-14 (×2): 25 mg via ORAL
  Filled 2019-10-13 (×2): qty 1

## 2019-10-13 MED ORDER — LORAZEPAM 2 MG/ML IJ SOLN
INTRAMUSCULAR | Status: AC
  Administered 2019-10-13: 1 mg via INTRAVENOUS
  Filled 2019-10-13: qty 1

## 2019-10-13 MED ORDER — LIDOCAINE 5 % EX PTCH
1.0000 | MEDICATED_PATCH | CUTANEOUS | Status: DC
  Administered 2019-10-13 – 2019-10-18 (×6): 1 via TRANSDERMAL
  Filled 2019-10-13 (×8): qty 1

## 2019-10-13 MED ORDER — CYCLOBENZAPRINE HCL 5 MG PO TABS
5.0000 mg | ORAL_TABLET | Freq: Three times a day (TID) | ORAL | Status: DC | PRN
  Administered 2019-10-13 – 2019-10-19 (×7): 5 mg via ORAL
  Filled 2019-10-13 (×8): qty 1

## 2019-10-13 MED ORDER — ONDANSETRON HCL 4 MG/2ML IJ SOLN
INTRAMUSCULAR | Status: AC
  Administered 2019-10-13: 4 mg via INTRAVENOUS
  Filled 2019-10-13: qty 2

## 2019-10-13 MED ORDER — ONDANSETRON HCL 4 MG/2ML IJ SOLN: 4.0000 mg | Freq: Four times a day (QID) | INTRAMUSCULAR | Status: DC | PRN

## 2019-10-13 MED ORDER — METOPROLOL TARTRATE 12.5 MG HALF TABLET
12.5000 mg | ORAL_TABLET | Freq: Four times a day (QID) | ORAL | Status: DC
  Administered 2019-10-13 – 2019-10-14 (×3): 12.5 mg via ORAL
  Filled 2019-10-13 (×3): qty 1

## 2019-10-13 MED ORDER — LORAZEPAM 2 MG/ML IJ SOLN: 1.0000 mg | Freq: Once | INTRAMUSCULAR | Status: AC

## 2019-10-13 NOTE — TOC Initial Note (Signed)
Transition of Care [facility]) - Initial/Assessment Note    Patient Details  Name: Katrina Randolph MRN: [id number] Date of Birth: November 04, 1930  [facility] CM/SW Contact:    Leeroy Cha, RN Phone Number: 10/13/2019, 9:14 AM  Clinical Narrative:                 P[atient with a. Fib on iv cardizem drip, h.r.=77-159, iv rocephin. Following for progression and toc need Plan is to return to home with self care.  Expected Discharge Plan: Home/Self Care Barriers to Discharge: Continued Medical Work up   Patient Goals and CMS Choice Patient states their goals for this hospitalization and ongoing recovery are:: to go home CMS Medicare.gov Compare Post Acute Care list provided to:: Patient    Expected Discharge Plan and Services Expected Discharge Plan: Home/Self Care   Discharge Planning Services: CM Consult   Living arrangements for the past 2 months: Single Family Home                                      Prior Living Arrangements/Services Living arrangements for the past 2 months: Single Family Home Lives with:: Spouse Patient language and need for interpreter reviewed:: Yes Do you feel safe going back to the place where you live?: Yes      Need for Family Participation in Patient Care: Yes (Comment) Care giver support system in place?: Yes (comment)   Criminal Activity/Legal Involvement Pertinent to Current Situation/Hospitalization: No - Comment as needed  Activities of Daily Living Home Assistive Devices/Equipment: Cane (specify quad or straight) ADL Screening (condition at time of admission) Patient's cognitive ability adequate to safely complete daily activities?: Yes Is the patient deaf or have difficulty hearing?: Yes Does the patient have difficulty seeing, even when wearing glasses/contacts?: No Does the patient have difficulty concentrating, remembering, or making decisions?: No Patient able to express need for assistance with ADLs?:  Yes Does the patient have difficulty dressing or bathing?: No Independently performs ADLs?: No Communication: Independent Dressing (OT): Independent Grooming: Independent Feeding: Independent Bathing: Independent Toileting: Needs assistance Is this a change from baseline?: Change from baseline, expected to last <3 days In/Out Bed: Needs assistance Is this a change from baseline?: Change from baseline, expected to last <3 days Walks in Home: Needs assistance Is this a change from baseline?: Change from baseline, expected to last <3 days Does the patient have difficulty walking or climbing stairs?: Yes Weakness of Legs: Both Weakness of Arms/Hands: Both  Permission Sought/Granted                  Emotional Assessment Appearance:: Appears stated age     Orientation: : Oriented to Self, Oriented to Place, Oriented to  Time, Oriented to Situation Alcohol / Substance Use: Not Applicable Psych Involvement: No (comment)  Admission diagnosis:  Rapid atrial fibrillation ([place]) [I48.91] Atrial fibrillation with rapid ventricular response ([place]) [I48.91] Community acquired pneumonia of left lung, unspecified part of lung [J18.9] Patient Active Problem List   Diagnosis Date Noted   Rapid atrial fibrillation ([place]) 10/12/2019   Pneumonia 10/12/2019   Anemia requiring transfusions 10/12/2019   DNR (do not resuscitate) 10/12/2019   Acquired thrombophilia ([place]) 10/12/2019   PCP:  [name], PA-C Pharmacy:   [id number] - [place], [place] [address] [place] [address] Phone: [phone] Fax: [phone]     Social Determinants  of Health (SDOH) Interventions    Readmission Risk Interventions No flowsheet data found.

## 2019-10-13 NOTE — Telephone Encounter (Signed)
    Pt has TOC appt with Coletta Memos on 10/28/2019 11:15 am, scheduled by Almyra Deforest

## 2019-10-13 NOTE — Progress Notes (Signed)
  Echocardiogram 2D Echocardiogram with 3D has been performed.  Katrina Randolph M 10/13/2019, 11:53 AM

## 2019-10-13 NOTE — Progress Notes (Signed)
At [place] patient called out in severe pain. She had been having spasms in her right neck all day, unrelieved by heat/lidoderm.ice. EKG no changes. Sharlet [place], [place] paged and at bedside. 1 mg ativan ordered and given as well as 4 mg zofran for nausea. within 5 minutes patient was stating pain was better. Daughter at bedside.

## 2019-10-13 NOTE — Consult Note (Addendum)
Cardiology Consultation:   Patient ID: Katrina Randolph MRN: [id number]; DOB: [birth year]  Admit date: 10/12/2019 Date of Consult: 10/13/2019  Primary Care Provider: [name], [facility] Cardiologist: New to [facility] - Dr. Axel [facility] Electrophysiologist:  None    Patient Profile:   Katrina Randolph is a 86 y.o. female with a hx of atrial fibrillation, chronic anemia, hypothyroidism and HTN who is being seen today for the evaluation of atrial fibrillation with RVR at the request of Dr. Erlinda Hong.  History of Present Illness:   Katrina Randolph is a pleasant 86 year old female with past medical history of atrial fibrillation, chronic anemia, hypothyroidism and hypertension.  She previously resides in [facility] for over 20 years.  She moved to [place] 3 years ago to be closer to one of her daughter.  While in [place], her primary care doctor was Dr. Anabel Bene.  Her hematologist was Dr. Audelia Hives.  She also had a cardiologist as well however unable to recall the name.  Unfortunately all of her previous doctor has moved away from the area, therefore family has not been able to obtain any record in the past few years.  Patient does not know if her atrial fibrillation is paroxysmal, persistent or chronic.  She never had a cardioversion in the past.  She did have a echocardiogram and Myoview over 10 years ago that was reportedly normal.  Prior to her lumpectomy procedure about 10 years ago, she did have a diagnostic cardiac catheterization in [place] in [place] that reportedly showed normal coronary arteries.  Despite her history of chronic anemia, she says she has never received a blood transfusion in the past.  Her previous hematologist/oncologist has put her on iron infusion therapy multiple times before.  According to the patient, she was on anticoagulation therapy in the past however Dr. Truddie Coco her hematologist and Dr. Anabel Bene her PCP  made a joint decision that anticoagulation therapy likely offer her more risk than benefit.  She has not seen any of her previous doctors in about 5 years.  Since moving to [place], she has not established with a cardiologist here either.  She is not on any rate control therapy or anticoagulation therapy.  Family mentions she goes through periods of fatigue and shortness of breath.  Again she is not sure if she stays in atrial fibrillation or if atrial fibrillation comes and goes.  She is up-to-date on vaccinations including pneumococcal and COVID-19 vaccine.  She received to [employer] vaccine around April.  According to her daughter, she has been feeling poorly with shortness of breath and fatigue since at least July of this year.  She does have a chronic dry cough which she attributed to the lisinopril.  However she denies any recent fever, chill, or productive cough.  She eventually presented to [facility] for worsening dyspnea with exertion.  On arrival, she was in atrial fibrillation with RVR with heart rate of 160 bpm.  Chest x-ray showed incidental finding of left lower lobe pneumonia.  Patient has been treated with both IV Cardizem and IV antibiotic.  TSH elevated to 7.95.  Troponin normal.  BNP elevated to 344.  COVID-19 test negative.  Heart rate improved on IV Cardizem.  Cardiology has been consulted for atrial fibrillation.   Past Medical History:  Diagnosis Date  . Atrial fibrillation [facility])     Past Surgical History:  Procedure Laterality Date  . ABDOMINAL HYSTERECTOMY    . BREAST  LUMPECTOMY    . CHOLECYSTECTOMY       Home Medications:  Prior to Admission medications   Medication Sig Start Date End Date Taking? Authorizing Provider  furosemide (LASIX) 20 MG tablet Take 20 mg by mouth daily as needed for fluid.  10/03/19  Yes [provider]  ibuprofen (ADVIL) 800 MG tablet Take 800 mg by mouth every 6 (six) hours as needed for moderate pain.   Yes [provider]  levothyroxine (SYNTHROID) 100 MCG tablet Take 100 mcg by mouth every morning. 08/15/19  Yes [provider]  lisinopril (ZESTRIL) 10 MG tablet Take 10 mg by mouth daily. 08/25/19  Yes [provider]  sertraline (ZOLOFT) 50 MG tablet Take 50 mg by mouth daily. 08/16/19  Yes [provider]  traMADol (ULTRAM) 50 MG tablet Take 50 mg by mouth every 6 (six) hours as needed for moderate pain.  09/17/19  Yes [provider]  traZODone (DESYREL) 50 MG tablet Take 50 mg by mouth at bedtime as needed for sleep.  09/06/19  Yes [provider]    Inpatient Medications: Scheduled Meds: . Chlorhexidine Gluconate Cloth  6 each Topical Q0600  . enoxaparin (LOVENOX) injection  40 mg Subcutaneous Q24H  . levothyroxine  100 mcg Oral Q0600  . lisinopril  10 mg Oral Daily  . mouth rinse  15 mL Mouth Rinse BID  . mupirocin ointment  1 application Nasal BID  . sertraline  50 mg Oral Daily   Continuous Infusions: . azithromycin    . cefTRIAXone (ROCEPHIN)  IV    . diltiazem (CARDIZEM) infusion 7.5 mg/hr (10/13/19 0600)   PRN Meds: acetaminophen **OR** acetaminophen, furosemide, traMADol, traZODone  Allergies:   No Known Allergies  Social History:   Social History   Socioeconomic History  . Marital status: Married    Spouse name: Not on file  . Number of children: Not on file  . Years of education: Not on file  . Highest education level: Not on file  Occupational History  . Not on file  Tobacco Use  . Smoking status: Former Research scientist (life sciences)  . Smokeless tobacco: Never Used  Vaping Use  . Vaping Use: Never used  Substance and Sexual Activity  . Alcohol use: Yes  . Drug use: Never  . Sexual activity: Not on file  Other Topics Concern  . Not on file  Social History Narrative  . Not on file   Social Determinants of Health   Financial Resource Strain:   . Difficulty of Paying Living Expenses: Not on file  Food Insecurity:   . Worried About  Charity fundraiser in the Last Year: Not on file  . Ran Out of Food in the Last Year: Not on file  Transportation Needs:   . Lack of Transportation (Medical): Not on file  . Lack of Transportation (Non-Medical): Not on file  Physical Activity:   . Days of Exercise per Week: Not on file  . Minutes of Exercise per Session: Not on file  Stress:   . Feeling of Stress : Not on file  Social Connections:   . Frequency of Communication with Friends and Family: Not on file  . Frequency of Social Gatherings with Friends and Family: Not on file  . Attends Religious Services: Not on file  . Active Member of Clubs or Organizations: Not on file  . Attends Archivist Meetings: Not on file  . Marital Status: Not on file  Intimate Partner Violence:   .  Fear of Current or Ex-Partner: Not on file  . Emotionally Abused: Not on file  . Physically Abused: Not on file  . Sexually Abused: Not on file    Family History:   History reviewed. No pertinent family history.   ROS:  Please see the history of present illness.   All other ROS reviewed and negative.     Physical Exam/Data:   Vitals:   10/13/19 0500 10/13/19 0600 10/13/19 0700 10/13/19 0800  BP: (!) 152/68 (!) 150/74 (!) 158/84 (!) 155/83  Pulse: 85 86 76 (!) 129  Resp: (!) 25 19 18 17   Temp:    97.7 F (36.5 C)  TempSrc:    Oral  SpO2: (!) 89% (!) 87% (!) 86% 91%  Weight:      Height:        Intake/Output Summary (Last 24 hours) at 10/13/2019 0926 Last data filed at 10/13/2019 0600 Gross per 24 hour  Intake 331.36 ml  Output 400 ml  Net -68.64 ml   Last 3 Weights 10/13/2019 10/12/2019  Weight (lbs) 180 lb 12.4 oz 170 lb  Weight (kg) 82 kg 77.111 kg     Body mass index is 33.06 kg/m.  General:  Well nourished, well developed, in no acute distress HEENT: normal Lymph: no adenopathy Neck: no JVD Endocrine:  No thryomegaly Vascular: No carotid bruits; FA pulses 2+ bilaterally without bruits  Cardiac:  normal S1, S2;  irregularly irregular; no murmur  Lungs: Right lung clear, crackles in the base of the left lung. Abd: soft, nontender, no hepatomegaly  Ext: no edema Musculoskeletal:  No deformities, BUE and BLE strength normal and equal Skin: warm and dry  Neuro:  CNs 2-12 intact, no focal abnormalities noted Psych:  Normal affect   EKG:  The EKG was personally reviewed and demonstrates: Atrial fibrillation with RVR Telemetry:  Telemetry was personally reviewed and demonstrates: Atrial fibrillation, heart rate is better controlled now in the 80s  Relevant CV Studies:  Pending echo  Laboratory Data:  High Sensitivity Troponin:   Recent Labs  Lab 10/12/19 1427 10/12/19 1620  TROPONINIHS 10 10     Chemistry Recent Labs  Lab 10/12/19 1427 10/13/19 0244  NA 143 141  K 4.9 4.6  CL 112* 111  CO2 20* 20*  GLUCOSE 105* 102*  BUN 18 17  CREATININE 0.95 0.84  CALCIUM 9.1 8.3*  GFRNONAA 54* >60  GFRAA >60 >60  ANIONGAP 11 10    Recent Labs  Lab 10/12/19 1427 10/13/19 0244  PROT 7.5 6.4*  ALBUMIN 3.9 3.2*  AST 21 15  ALT 14 13  ALKPHOS 63 53  BILITOT 1.2 1.2   Hematology Recent Labs  Lab 10/12/19 1427 10/13/19 0244  WBC 7.7 7.1  RBC 4.39 3.85*  HGB 9.5* 8.2*  HCT 33.6* 30.4*  MCV 76.5* 79.0*  MCH 21.6* 21.3*  MCHC 28.3* 27.0*  RDW 20.1* 20.3*  PLT 239 193   BNP Recent Labs  Lab 10/12/19 1427  BNP 344.5*    DDimer No results for input(s): DDIMER in the last 168 hours.   Radiology/Studies:  DG Chest Port 1 View  Result Date: 10/12/2019 CLINICAL DATA:  Worsened shortness of breath and dizziness over the last 2 weeks. EXAM: PORTABLE CHEST 1 VIEW COMPARISON:  None. FINDINGS: Mild cardiomegaly. Aortic atherosclerotic calcification. The right lung is clear. There is infiltrate and volume loss in the left lower lobe consistent with pneumonia. Possible small amount of pleural fluid on the left. No significant bone  finding. IMPRESSION: 1. Left lower lobe pneumonia and  volume loss. Possible small amount of pleural fluid on the left. 2. Mild cardiomegaly. Aortic atherosclerosis. Electronically Signed   By: Nelson Chimes M.D.   On: 10/12/2019 15:07    Assessment and Plan:   1. Atrial fibrillation: Unclear if paroxysmal, persistent or chronic  -Diagnosed over 10 years ago in [place].  Had previous echocardiogram and Myoview by her cardiologist which were reportedly normal.  She had cardiac catheterization at [facility] around 10 years ago as well prior to lumpectomy and was told she has normal coronary arteries.  I do not have any of the above record.  Family has been trying to obtain records for the past 2 years without success as all of her previous doctors including PCP, hematologist and cardiologist has all moved away from the area.  -She does have intermittent periods of shortness of breath and fatigue, however does not know if she stays in A. fib or goes in and out of A. fib.  The periods of symptom suggest she may be going in and out of atrial fibrillation previously.  -Continue IV Cardizem.  Will wait for the result of the echocardiogram.  -If EF is low, likely will switch Cardizem to carvedilol.  We will need to look at the size of the left atrium to see if she has been in atrial fibrillation for extended period  -Her previous cardiologist and hematologist has made a joint decision not to place her on anticoagulation therapy due to her chronic anemia requiring iron transfusion.  She has undergone multiple GI work-up in the past without identifiable cause.  -Current hemoglobin is 8.2.  We will hold off on placing her on anticoagulation therapy.  2. Left lower lobe pneumonia: Seen on chest x-ray.  Received antibiotic.  3. Chronic anemia: No prior history of blood transfusion.  Has received multiple courses of iron infusion therapy in the past.  -According to the patient, she has underwent both upper endoscopy and colonoscopy in the past and was  unable to identify a cause for the anemia.  -Her previous hematologist and PCP have made a joint decision to stop her anticoagulation due to unidentified source for the anemia.  4. Hypothyroidism: Elevated TSH.  Will defer to primary team to check free T4.  On levothyroxine at home  5. Hypertension: On lisinopril at home.  6. H/o heart murmur: She reportedly had history of heart murmur and moderate valve leakage, I did not appreciate significant heart murmur on physical exam.  Pending echocardiogram.      For questions or updates, please contact [facility] Please consult [email or web address] for contact info under    Signed, Almyra Deforest, [place]  10/13/2019 9:26 AM   History and all data above reviewed.  Patient examined.  I agree with the findings as above.    Echo: 1. Left ventricular ejection fraction, by estimation, is 40 to 45%. The  left ventricle has mildly decreased function. The left ventricle  demonstrates global hypokinesis. Left ventricular diastolic function could  not be evaluated.  2. Right ventricular systolic function is normal. The right ventricular  size is normal. There is moderately elevated pulmonary artery systolic  pressure.  3. Left atrial size was severely dilated.  4. Right atrial size was severely dilated.  5. The mitral valve is normal in structure. Mild mitral valve  regurgitation. No evidence of mitral stenosis.  6. The aortic valve is normal in structure. Aortic valve regurgitation is  not visualized. No aortic stenosis is present.  7. The inferior vena cava is dilated in size with <50% respiratory  variability, suggesting right atrial pressure of 15 mmHg.   The patient exam reveals [id number]   ,  Lungs: Decreased breath sounds left greater than right bases with crackles.   ,  Abd: Positive bowel sounds, no rebound no guarding, Ext No edema  .  All available labs, radiology testing, previous records reviewed.    Agree with documented assessment  and plan.   Atrial fib:  I suspect this could be chronic given the size of her LA.  No anticoagulation although we should continue to explore the risk of bleeding with hematology.  Katrina Randolph has a CHA2DS2 - VASc score of 3.  I will start PO metoprolol and I discussed with nursing weaning and discontinuing the IV Cardizem.    Cardiomyopathy:  No old records to understand how chronic this might be.  I would like to switch to ARB and possibly ARNI in the future particularly given the dry cough.           Katrina Randolph  1:47 PM  10/13/2019

## 2019-10-13 NOTE — Progress Notes (Addendum)
PROGRESS NOTE    Katrina Randolph  EZM:[id number] DOB: 1930/04/30 DOA: 10/12/2019 PCP: [name], PA-C    Chief Complaint  Patient presents with  . Dizziness  . Shortness of Breath  . Atrial Fibrillation    Brief Narrative:  History of chronic A. fib for 10 years not on any rate control medication not anticoagulation prior to this hospitalization History of anemia require blood transfusions She also has history of hypothyroidism on thyroid supplement,  Presented to ED due to short of breath with dizziness for the last few weeks  Report chronic intermittent cough due to lisinopril, chest x-ray showed left lower lobe pneumonia Is no fever, no hypoxia  Subjective:  She is on Cardizem drip, heart rate is improving, remains in A. Fib She denies chest pain at rest Has intermittent nonproductive cough, no hypoxia, no fever No lower extremity edema  Assessment & Plan:   Principal Problem:   Rapid atrial fibrillation (HCC) Active Problems:   Pneumonia   Anemia requiring transfusions   DNR (do not resuscitate)   Acquired thrombophilia ([place])   Afib/RVR - with Acquired thrombophilia secondary to chronic afib, she is  not on anticoagulation due to h/o transfusion dependent anemia -At baseline she is rate controlled without rate limiting medication , she denies history of bradycardia -Currently she is on cardizem drip -High sensitivity troponin 10, BNP 344 -Echocardiogram pending -Cardiology consulted  Lobar pneumonia Left lower lobe Continue Rocephin and Zithromax for now   HTN Please currently on Cardizem drip, and lisinopril  Microcytic anemia No sign of bleeding Monitor hemoglobin  Body mass index is 33.06 kg/m.   C/o left side neck pain, topical analgesic   DVT prophylaxis: enoxaparin (LOVENOX) injection 40 mg Start: 10/12/19 1800   Code Status: DNR Family Communication: Patient Disposition:   Status is: Inpatient    Dispo: The patient is  from: Home              Anticipated d/c is to: Home              Anticipated d/c date is:  Patient currently on Cardizem drip remain in stepdown unit  Consultants:   Cardiology  Procedures:   None  Antimicrobials:   Rocephin and Zithromax     Objective: Vitals:   10/13/19 0400 10/13/19 0414 10/13/19 0500 10/13/19 0600  BP: (!) 156/82  (!) 152/68 (!) 150/74  Pulse: (!) 102  85 86  Resp: 19  (!) 25 19  Temp:  97.8 F (36.6 C)    TempSrc:  Oral    SpO2: 93%  (!) 89% (!) 87%  Weight:  82 kg    Height:        Intake/Output Summary (Last 24 hours) at 10/13/2019 0748 Last data filed at 10/13/2019 0600 Gross per 24 hour  Intake 331.36 ml  Output 400 ml  Net -68.64 ml   Filed Weights   10/12/19 1410 10/13/19 0414  Weight: 77.1 kg 82 kg    Examination:  General exam: Frail, but calm, NAD Respiratory system: Left lower lobe rhonchi , no wheezing,  Respiratory effort normal. Cardiovascular system: S1 & S2 heard, IRRR. No JVD, no murmur, No pedal edema. Gastrointestinal system: Abdomen is nondistended, soft and nontender. No organomegaly or masses felt. Normal bowel sounds heard. Central nervous system: Alert and oriented. No focal neurological deficits. Extremities: Symmetric 5 x 5 power. Skin: No rashes, lesions or ulcers Psychiatry: Judgement and insight appear normal. Mood & affect appropriate.  Data Reviewed: I have personally reviewed following labs and imaging studies  CBC: Recent Labs  Lab 10/12/19 1427 10/13/19 0244  WBC 7.7 7.1  NEUTROABS 6.0  --   HGB 9.5* 8.2*  HCT 33.6* 30.4*  MCV 76.5* 79.0*  PLT 239 119    Basic Metabolic Panel: Recent Labs  Lab 10/12/19 1427 10/12/19 1620 10/13/19 0244  NA 143  --  141  K 4.9  --  4.6  CL 112*  --  111  CO2 20*  --  20*  GLUCOSE 105*  --  102*  BUN 18  --  17  CREATININE 0.95  --  0.84  CALCIUM 9.1  --  8.3*  MG  --  2.2  --     GFR: Estimated Creatinine Clearance: 46.9 mL/min (by C-G formula  based on SCr of 0.84 mg/dL).  Liver Function Tests: Recent Labs  Lab 10/12/19 1427 10/13/19 0244  AST 21 15  ALT 14 13  ALKPHOS 63 53  BILITOT 1.2 1.2  PROT 7.5 6.4*  ALBUMIN 3.9 3.2*    CBG: No results for input(s): GLUCAP in the last 168 hours.   Recent Results (from the past 240 hour(s))  SARS Coronavirus 2 by RT PCR (hospital order, performed in [facility] hospital lab) Nasopharyngeal Nasopharyngeal Swab     Status: None   Collection Time: 10/12/19  4:12 PM   Specimen: Nasopharyngeal Swab  Result Value Ref Range Status   SARS Coronavirus 2 NEGATIVE NEGATIVE Final    Comment: (NOTE) SARS-CoV-2 target nucleic acids are NOT DETECTED.  The SARS-CoV-2 RNA is generally detectable in upper and lower respiratory specimens during the acute phase of infection. The lowest concentration of SARS-CoV-2 viral copies this assay can detect is 250 copies / mL. A negative result does not preclude SARS-CoV-2 infection and should not be used as the sole basis for treatment or other patient management decisions.  A negative result may occur with improper specimen collection / handling, submission of specimen other than nasopharyngeal swab, presence of viral mutation(s) within the areas targeted by this assay, and inadequate number of viral copies (<250 copies / mL). A negative result must be combined with clinical observations, patient history, and epidemiological information.  Fact Sheet for Patients:   StrictlyIdeas.no  Fact Sheet for Healthcare Providers: BankingDealers.co.za  This test is not yet approved or  cleared by the [place] FDA and has been authorized for detection and/or diagnosis of SARS-CoV-2 by FDA under an Emergency Use Authorization (EUA).  This EUA will remain in effect (meaning this test can be used) for the duration of the COVID-19 declaration under Section 564(b)(1) of the Act, 21 U.S.C. section  360bbb-3(b)(1), unless the authorization is terminated or revoked sooner.  Performed at [facility], [place] [address] [place], [place] [address]   MRSA PCR Screening     Status: Abnormal   Collection Time: 10/12/19  6:41 PM   Specimen: Nasopharyngeal  Result Value Ref Range Status   MRSA by PCR POSITIVE (A) NEGATIVE Final    Comment:        The GeneXpert MRSA Assay (FDA approved for NASAL specimens only), is one component of a comprehensive MRSA colonization surveillance program. It is not intended to diagnose MRSA infection nor to guide or monitor treatment for MRSA infections. RESULT CALLED TO, READ BACK BY AND VERIFIED WITH: MCILQUHAM,D. RN @2103  ON 08.31.2021 BY COHEN,K Performed at [facility], [place] [address], [place], [place] [address]  Radiology Studies: DG Chest Port 1 View  Result Date: 10/12/2019 CLINICAL DATA:  Worsened shortness of breath and dizziness over the last 2 weeks. EXAM: PORTABLE CHEST 1 VIEW COMPARISON:  None. FINDINGS: Mild cardiomegaly. Aortic atherosclerotic calcification. The right lung is clear. There is infiltrate and volume loss in the left lower lobe consistent with pneumonia. Possible small amount of pleural fluid on the left. No significant bone finding. IMPRESSION: 1. Left lower lobe pneumonia and volume loss. Possible small amount of pleural fluid on the left. 2. Mild cardiomegaly. Aortic atherosclerosis. Electronically Signed   By: Nelson Chimes M.D.   On: 10/12/2019 15:07        Scheduled Meds: . Chlorhexidine Gluconate Cloth  6 each Topical Q0600  . enoxaparin (LOVENOX) injection  40 mg Subcutaneous Q24H  . levothyroxine  100 mcg Oral Q0600  . lisinopril  10 mg Oral Daily  . mouth rinse  15 mL Mouth Rinse BID  . mupirocin ointment  1 application Nasal BID  . sertraline  50 mg Oral Daily   Continuous Infusions: . azithromycin    . cefTRIAXone (ROCEPHIN)  IV    . diltiazem (CARDIZEM)  infusion 7.5 mg/hr (10/13/19 0600)     LOS: 1 day   Time spent: 35 mins Greater than 50% of this time was spent in counseling, explanation of diagnosis, planning of further management, and coordination of care.  I have personally reviewed and interpreted on  10/13/2019 daily labs, tele strips, imagings as discussed above under date review session and assessment and plans.  I reviewed all nursing notes, pharmacy notes, consultant notes,  vitals, pertinent old records  I have discussed plan of care as described above with RN , patient on 10/13/2019  Voice Recognition /Dragon dictation system was used to create this note, attempts have been made to correct errors. Please contact the author with questions and/or clarifications.   Florencia Reasons, MD PhD FACP [facility]  Available via Epic secure chat 7am-7pm for nonurgent issues Please page for urgent issues To page the attending provider between 7A-7P or the covering provider during after hours 7P-7A, please log into the web site [email or web address] and access using universal [place] password for that web site. If you do not have the password, please call the hospital operator.    10/13/2019, 7:48 AM

## 2019-10-14 LAB — MAGNESIUM: Magnesium: 2.1 mg/dL (ref 1.7–2.4)

## 2019-10-14 LAB — BASIC METABOLIC PANEL
Anion gap: 8 (ref 5–15)
BUN: 15 mg/dL (ref 8–23)
CO2: 21 mmol/L — ABNORMAL LOW (ref 22–32)
Calcium: 8.2 mg/dL — ABNORMAL LOW (ref 8.9–10.3)
Chloride: 113 mmol/L — ABNORMAL HIGH (ref 98–111)
Creatinine, Ser: 0.71 mg/dL (ref 0.44–1.00)
GFR calc Af Amer: >60 mL/min (ref >60)
GFR calc non Af Amer: >60 mL/min (ref >60)
Glucose, Bld: 104 mg/dL — ABNORMAL HIGH (ref 70–99)
Potassium: 4.8 mmol/L (ref 3.5–5.1)
Sodium: 142 mmol/L (ref 135–145)

## 2019-10-14 LAB — CBC WITH DIFFERENTIAL/PLATELET
Abs Immature Granulocytes: 0.03 10*3/uL (ref 0.00–0.07)
Basophils Absolute: 0.1 10*3/uL (ref 0.0–0.1)
Basophils Relative: 1 %
Eosinophils Absolute: 0.2 10*3/uL (ref 0.0–0.5)
Eosinophils Relative: 3 %
HCT: 30 % — ABNORMAL LOW (ref 36.0–46.0)
Hemoglobin: 8.3 g/dL — ABNORMAL LOW (ref 12.0–15.0)
Immature Granulocytes: 0 %
Lymphocytes Relative: 7 %
Lymphs Abs: 0.6 10*3/uL — ABNORMAL LOW (ref 0.7–4.0)
MCH: 21.6 pg — ABNORMAL LOW (ref 26.0–34.0)
MCHC: 27.7 g/dL — ABNORMAL LOW (ref 30.0–36.0)
MCV: 78.1 fL — ABNORMAL LOW (ref 80.0–100.0)
Monocytes Absolute: 0.5 10*3/uL (ref 0.1–1.0)
Monocytes Relative: 6 %
Neutro Abs: 6.7 10*3/uL (ref 1.7–7.7)
Neutrophils Relative %: 83 %
Platelets: 192 10*3/uL (ref 150–400)
RBC: 3.84 MIL/uL — ABNORMAL LOW (ref 3.87–5.11)
RDW: 19.9 % — ABNORMAL HIGH (ref 11.5–15.5)
WBC: 8.1 10*3/uL (ref 4.0–10.5)
nRBC: 0 % (ref 0.0–0.2)

## 2019-10-14 LAB — RETICULOCYTES
Immature Retic Fract: 33.1 % — ABNORMAL HIGH (ref 2.3–15.9)
RBC.: 3.72 MIL/uL — ABNORMAL LOW (ref 3.87–5.11)
Retic Count, Absolute: 64.7 10*3/uL (ref 19.0–186.0)
Retic Ct Pct: 1.7 % (ref 0.4–3.1)

## 2019-10-14 LAB — STREP PNEUMONIAE URINARY ANTIGEN: Strep Pneumo Urinary Antigen: POSITIVE — AB

## 2019-10-14 LAB — FERRITIN: Ferritin: 18 ng/mL (ref 11–307)

## 2019-10-14 LAB — IRON AND TIBC
Iron: 26 ug/dL — ABNORMAL LOW (ref 28–170)
Saturation Ratios: 6 % — ABNORMAL LOW (ref 10.4–31.8)
TIBC: 441 ug/dL (ref 250–450)
UIBC: 415 ug/dL

## 2019-10-14 LAB — VITAMIN B12: Vitamin B-12: 577 pg/mL (ref 180–914)

## 2019-10-14 LAB — FOLATE: Folate: 17.6 ng/mL (ref >5.9)

## 2019-10-14 MED ORDER — METOPROLOL SUCCINATE ER 50 MG PO TB24
75.0000 mg | ORAL_TABLET | Freq: Every day | ORAL | Status: DC
  Administered 2019-10-14 – 2019-10-20 (×6): 75 mg via ORAL
  Filled 2019-10-14 (×2): qty 1
  Filled 2019-10-14: qty 3
  Filled 2019-10-14 (×4): qty 1

## 2019-10-14 MED ORDER — SENNOSIDES-DOCUSATE SODIUM 8.6-50 MG PO TABS
1.0000 | ORAL_TABLET | Freq: Two times a day (BID) | ORAL | Status: DC
  Administered 2019-10-17 – 2019-10-18 (×2): 1 via ORAL
  Filled 2019-10-14 (×10): qty 1

## 2019-10-14 MED ORDER — SODIUM CHLORIDE 0.9 % IV SOLN
2.0000 g | INTRAVENOUS | Status: DC
  Administered 2019-10-14: 2 g via INTRAVENOUS
  Filled 2019-10-14: qty 2
  Filled 2019-10-14: qty 20

## 2019-10-14 MED ORDER — AZITHROMYCIN 250 MG PO TABS
500.0000 mg | ORAL_TABLET | Freq: Every day | ORAL | Status: AC
  Administered 2019-10-14 – 2019-10-16 (×3): 500 mg via ORAL
  Filled 2019-10-14 (×3): qty 2

## 2019-10-14 NOTE — Progress Notes (Signed)
PHARMACIST - PHYSICIAN COMMUNICATION  CONCERNING: Antibiotic IV to Oral Route Change Policy  RECOMMENDATION: This patient is receiving azithromycin by the intravenous route.  Based on criteria approved by the Pharmacy and Therapeutics Committee, the antibiotic(s) is/are being converted to the equivalent oral dose form(s).   DESCRIPTION: These criteria include:  Patient being treated for a respiratory tract infection, urinary tract infection, cellulitis or clostridium difficile associated diarrhea if on metronidazole  The patient is not neutropenic and does not exhibit a GI malabsorption state  The patient is eating (either orally or via tube) and/or has been taking other orally administered medications for a least 24 hours  The patient is improving clinically and has a Tmax < 100.5  If you have questions about this conversion, please contact the Pharmacy Department  []  ( [phone] )  [place] []  ( [phone] )  [facility] []  ( [phone] )  [place] []  ( [phone] )  [facility] [x]  ( [phone] )  [facility]  

## 2019-10-14 NOTE — Progress Notes (Addendum)
Progress Note  Patient Name: Katrina Randolph Date of Encounter: 10/14/2019  [facility] Cardiologist: No primary care provider on file. new  Subjective   No chest pain or SOB sitting up in chair - off dilt. Complains of spasms in neck   Inpatient Medications    Scheduled Meds: . Chlorhexidine Gluconate Cloth  6 each Topical Q0600  . enoxaparin (LOVENOX) injection  40 mg Subcutaneous Q24H  . levothyroxine  100 mcg Oral Q0600  . lidocaine  1 patch Transdermal Q24H  . losartan  25 mg Oral Daily  . mouth rinse  15 mL Mouth Rinse BID  . metoprolol tartrate  12.5 mg Oral Q6H  . mupirocin ointment  1 application Nasal BID  . senna-docusate  1 tablet Oral BID  . sertraline  50 mg Oral Daily   Continuous Infusions: . azithromycin Stopped (10/13/19 1919)  . cefTRIAXone (ROCEPHIN)  IV Stopped (10/13/19 1701)  . diltiazem (CARDIZEM) infusion Stopped (10/13/19 1616)   PRN Meds: acetaminophen **OR** acetaminophen, cyclobenzaprine, furosemide, ondansetron (ZOFRAN) IV, traMADol, traZODone   Vital Signs    Vitals:   10/14/19 0800 10/14/19 0845 10/14/19 1200 10/14/19 1300  BP: 100/77   127/61  Pulse: 83 (!) 143  (!) 115  Resp: 14     Temp: 97.6 F (36.4 C)  (!) 97.5 F (36.4 C)   TempSrc: Oral  Axillary   SpO2: 98%     Weight:      Height:        Intake/Output Summary (Last 24 hours) at 10/14/2019 1308 Last data filed at 10/14/2019 0800 Gross per 24 hour  Intake 624.76 ml  Output 1225 ml  Net -600.24 ml   Last 3 Weights 10/13/2019 10/12/2019  Weight (lbs) 180 lb 12.4 oz 170 lb  Weight (kg) 82 kg 77.111 kg      Telemetry    A fib HR 100 to 110  - Personally Reviewed  ECG    No new - Personally Reviewed  Physical Exam   GEN: No acute distress.   Neck: No JVD sitting up in chair Cardiac: irreg irreg, no murmurs, rubs, or gallops.  Respiratory: rhonchi in bases to auscultation bilaterally. GI: Soft, nontender, non-distended  MS: No edema; No deformity. Neuro:   Nonfocal  Psych: Normal affect   Labs    High Sensitivity Troponin:   Recent Labs  Lab 10/12/19 1427 10/12/19 1620  TROPONINIHS 10 10      Chemistry Recent Labs  Lab 10/12/19 1427 10/13/19 0244 10/14/19 0300  NA 143 141 142  K 4.9 4.6 4.8  CL 112* 111 113*  CO2 20* 20* 21*  GLUCOSE 105* 102* 104*  BUN 18 17 15   CREATININE 0.95 0.84 0.71  CALCIUM 9.1 8.3* 8.2*  PROT 7.5 6.4*  --   ALBUMIN 3.9 3.2*  --   AST 21 15  --   ALT 14 13  --   ALKPHOS 63 53  --   BILITOT 1.2 1.2  --   GFRNONAA 54* >60 >60  GFRAA >60 >60 >60  ANIONGAP 11 10 8      Hematology Recent Labs  Lab 10/12/19 1427 10/12/19 1427 10/13/19 0244 10/14/19 0300  WBC 7.7  --  7.1 8.1  RBC 4.39   < > 3.85* 3.84*  3.72*  HGB 9.5*  --  8.2* 8.3*  HCT 33.6*  --  30.4* 30.0*  MCV 76.5*  --  79.0* 78.1*  MCH 21.6*  --  21.3* 21.6*  MCHC 28.3*  --  27.0* 27.7*  RDW 20.1*  --  20.3* 19.9*  PLT 239  --  193 192   < > = values in this interval not displayed.    BNP Recent Labs  Lab 10/12/19 1427  BNP 344.5*     DDimer No results for input(s): DDIMER in the last 168 hours.   Radiology    DG Chest Port 1 View  Result Date: 10/12/2019 CLINICAL DATA:  Worsened shortness of breath and dizziness over the last 2 weeks. EXAM: PORTABLE CHEST 1 VIEW COMPARISON:  None. FINDINGS: Mild cardiomegaly. Aortic atherosclerotic calcification. The right lung is clear. There is infiltrate and volume loss in the left lower lobe consistent with pneumonia. Possible small amount of pleural fluid on the left. No significant bone finding. IMPRESSION: 1. Left lower lobe pneumonia and volume loss. Possible small amount of pleural fluid on the left. 2. Mild cardiomegaly. Aortic atherosclerosis. Electronically Signed   By: Nelson Chimes M.D.   On: 10/12/2019 15:07   ECHOCARDIOGRAM COMPLETE  Result Date: 10/13/2019    ECHOCARDIOGRAM REPORT   Patient Name:   Katrina Randolph Date of Exam: 10/13/2019 Medical Rec #:  [id number]          Height:       62.0 in Accession #:    [id number]        Weight:       180.8 lb Date of Birth:  12/12/1930         BSA:          1.831 m Patient Age:    86 years          BP:           110/71 mmHg Patient Gender: F                 HR:           75 bpm. Exam Location:  Inpatient Procedure: 2D Echo, 3D Echo and Strain Analysis Indications:    Atrial Fibrillation 427.31 / I48.91  History:        Patient has no prior history of Echocardiogram examinations.                 Arrythmias:Atrial Fibrillation; Risk Factors:Hypertension.                 Anemia, Pneumonia, Acquired thrombophilia, Hypothyroidism.  Sonographer:    Darlina Sicilian RDCS Referring Phys: [place]  1. Left ventricular ejection fraction, by estimation, is 40 to 45%. The left ventricle has mildly decreased function. The left ventricle demonstrates global hypokinesis. Left ventricular diastolic function could not be evaluated.  2. Right ventricular systolic function is normal. The right ventricular size is normal. There is moderately elevated pulmonary artery systolic pressure.  3. Left atrial size was severely dilated.  4. Right atrial size was severely dilated.  5. The mitral valve is normal in structure. Mild mitral valve regurgitation. No evidence of mitral stenosis.  6. The aortic valve is normal in structure. Aortic valve regurgitation is not visualized. No aortic stenosis is present.  7. The inferior vena cava is dilated in size with <50% respiratory variability, suggesting right atrial pressure of 15 mmHg. FINDINGS  Left Ventricle: Left ventricular ejection fraction, by estimation, is 40 to 45%. The left ventricle has mildly decreased function. The left ventricle demonstrates global hypokinesis. The left ventricular internal cavity size was normal in size. There is  no left ventricular hypertrophy. Left ventricular diastolic function could not  be evaluated due to atrial fibrillation. Left ventricular diastolic function could  not be evaluated. Right Ventricle: The right ventricular size is normal. No increase in right ventricular wall thickness. Right ventricular systolic function is normal. There is moderately elevated pulmonary artery systolic pressure. The tricuspid regurgitant velocity is 3.09 m/s, and with an assumed right atrial pressure of 15 mmHg, the estimated right ventricular systolic pressure is 19.3 mmHg. Left Atrium: Left atrial size was severely dilated. Right Atrium: Right atrial size was severely dilated. Pericardium: Trivial pericardial effusion is present. The pericardial effusion is circumferential. Mitral Valve: The mitral valve is normal in structure. Normal mobility of the mitral valve leaflets. Mild mitral valve regurgitation, with centrally-directed jet. No evidence of mitral valve stenosis. Tricuspid Valve: The tricuspid valve is normal in structure. Tricuspid valve regurgitation is mild . No evidence of tricuspid stenosis. Aortic Valve: The aortic valve is normal in structure. Aortic valve regurgitation is not visualized. No aortic stenosis is present. Pulmonic Valve: The pulmonic valve was normal in structure. Pulmonic valve regurgitation is not visualized. No evidence of pulmonic stenosis. Aorta: The aortic root is normal in size and structure. Venous: The inferior vena cava is dilated in size with less than 50% respiratory variability, suggesting right atrial pressure of 15 mmHg. IAS/Shunts: No atrial level shunt detected by color flow Doppler.  LEFT VENTRICLE PLAX 2D LVIDd:         4.80 cm LVIDs:         3.90 cm LV PW:         1.00 cm LV IVS:        0.90 cm LVOT diam:     2.00 cm  3D Volume EF: LV SV:         39       3D EF:        44 % LV SV Index:   21       LV EDV:       104 ml LVOT Area:     3.14 cm LV ESV:       58 ml                         LV SV:        46 ml RIGHT VENTRICLE TAPSE (M-mode): 1.2 cm LEFT ATRIUM             Index       RIGHT ATRIUM           Index LA diam:        5.30 cm 2.89 cm/m  RA  Area:     31.90 cm LA Vol (A2C):   94.6 ml 51.66 ml/m RA Volume:   112.00 ml 61.16 ml/m LA Vol (A4C):   84.7 ml 46.25 ml/m LA Biplane Vol: 89.7 ml 48.98 ml/m  AORTIC VALVE LVOT Vmax:   65.30 cm/s LVOT Vmean:  49.800 cm/s LVOT VTI:    0.125 m  AORTA Ao Root diam: 3.70 cm MITRAL VALVE                TRICUSPID VALVE MV Area (PHT): 4.15 cm     TR Peak grad:   38.2 mmHg MV Decel Time: 183 msec     TR Vmax:        309.00 cm/s MR Peak grad: 87.6 mmHg MR Mean grad: 53.0 mmHg     SHUNTS MR Vmax:      468.00 cm/s   Systemic VTI:  0.12 m MR Vmean:     345.0 cm/s    Systemic Diam: 2.00 cm MV E velocity: 125.33 cm/s Sanda Klein MD Electronically signed by Sanda Klein MD Signature Date/Time: 10/13/2019/12:50:44 PM    Final     Cardiac Studies   Echo 10/13/19 IMPRESSIONS    1. Left ventricular ejection fraction, by estimation, is 40 to 45%. The  left ventricle has mildly decreased function. The left ventricle  demonstrates global hypokinesis. Left ventricular diastolic function could  not be evaluated.  2. Right ventricular systolic function is normal. The right ventricular  size is normal. There is moderately elevated pulmonary artery systolic  pressure.  3. Left atrial size was severely dilated.  4. Right atrial size was severely dilated.  5. The mitral valve is normal in structure. Mild mitral valve  regurgitation. No evidence of mitral stenosis.  6. The aortic valve is normal in structure. Aortic valve regurgitation is  not visualized. No aortic stenosis is present.  7. The inferior vena cava is dilated in size with <50% respiratory  variability, suggesting right atrial pressure of 15 mmHg.   FINDINGS  Left Ventricle: Left ventricular ejection fraction, by estimation, is 40  to 45%. The left ventricle has mildly decreased function. The left  ventricle demonstrates global hypokinesis. The left ventricular internal  cavity size was normal in size. There is  no left ventricular  hypertrophy. Left ventricular diastolic function  could not be evaluated due to atrial fibrillation. Left ventricular  diastolic function could not be evaluated.   Right Ventricle: The right ventricular size is normal. No increase in  right ventricular wall thickness. Right ventricular systolic function is  normal. There is moderately elevated pulmonary artery systolic pressure.  The tricuspid regurgitant velocity is  3.09 m/s, and with an assumed right atrial pressure of 15 mmHg, the  estimated right ventricular systolic pressure is 27.0 mmHg.   Left Atrium: Left atrial size was severely dilated.   Right Atrium: Right atrial size was severely dilated.   Pericardium: Trivial pericardial effusion is present. The pericardial  effusion is circumferential.   Mitral Valve: The mitral valve is normal in structure. Normal mobility of  the mitral valve leaflets. Mild mitral valve regurgitation, with  centrally-directed jet. No evidence of mitral valve stenosis.   Tricuspid Valve: The tricuspid valve is normal in structure. Tricuspid  valve regurgitation is mild . No evidence of tricuspid stenosis.   Aortic Valve: The aortic valve is normal in structure. Aortic valve  regurgitation is not visualized. No aortic stenosis is present.   Pulmonic Valve: The pulmonic valve was normal in structure. Pulmonic valve  regurgitation is not visualized. No evidence of pulmonic stenosis.   Aorta: The aortic root is normal in size and structure.   Venous: The inferior vena cava is dilated in size with less than 50%  respiratory variability, suggesting right atrial pressure of 15 mmHg.   IAS/Shunts: No atrial level shunt detected by color flow Doppler.   Patient Profile     86 y.o. female now with admit for PNA with a fib RVR, anemia with need for transfusions, HTN, hypothyroidism.   Assessment & Plan    Atrial fibrillation: most likely chronic given size of LA             -Diagnosed over 10 years  ago in [place].  Had previous echocardiogram and Myoview by her cardiologist which were reportedly normal.  She had cardiac catheterization at [facility] around 10 years ago  as well prior to lumpectomy and was told she has normal coronary arteries.  I do not have any of the above record.  Family has been trying to obtain records for the past 2 years without success as all of her previous doctors including PCP, hematologist and cardiologist has all moved away from the area.             -She does have intermittent periods of shortness of breath and fatigue, however does not know if she stays in A. fib or goes in and out of A. fib.  The periods of symptom suggest she may be going in and out of atrial fibrillation previously.             -IV dilt stopped  EF decreased to 40-45%              would not use dilt as outpt with decreased EF, on BB may need to increase to stop dilt.               -Her previous cardiologist and hematologist has made a joint decision not to place her on anticoagulation therapy due to her chronic anemia requiring iron transfusion.  She has undergone multiple GI work-up in the past without identifiable cause.             -Current hemoglobin is 8.3.  We will hold off on placing her on anticoagulation therapy.  2. Left lower lobe pneumonia: Seen on chest x-ray.  Received antibiotic.per IM  3. Chronic anemia: No prior history of blood transfusion.  Has received multiple courses of iron infusion therapy in the past.             -According to the patient, she has had both upper endoscopy and colonoscopy in the past and was unable to identify a cause for the anemia.             -Her previous hematologist and PCP have made a joint decision to stop her anticoagulation due to unidentified source for the anemia. --hgb 8.3  Down from 9.5    4. Hypothyroidism: Elevated TSH.  Will defer to primary team to check free T4.  On levothyroxine at home  5. Hypertension: On  lisinopril at home.now on losartan and BB bp stable 704 to 888 systolic.    6. H/o heart murmur: She reportedly had history of heart murmur and moderate valve leakage, I did not appreciate significant heart murmur on physical exam. Mild MR on echo.     For questions or updates, please contact [place] Please consult [email or web address] for contact info under        Signed, Cecilie Kicks, NP  10/14/2019, 1:08 PM    History and all data above reviewed.  Patient examined.  I agree with the findings as above.  Her biggest complaint is neck pain with apparent muscle spasm.  The patient exam reveals BVQ:[id number]   ,  Lungs: clear  ,  Abd: Positive bowel sounds, no rebound no guarding, Ext No edema  .  All available labs, radiology testing, previous records reviewed. Agree with documented assessment and plan.   Atrial fib:  I will increase the beta blocker slightly and change to Toprol XL.  Cardizem IV stopped.  Cardiomyopathy:  Question as to whether this is chronic.  I have switched to ARB.     Jeneen Rinks Alinah Sheard  2:13 PM  10/14/2019

## 2019-10-14 NOTE — Telephone Encounter (Signed)
Currently admitted.

## 2019-10-14 NOTE — Progress Notes (Signed)
PROGRESS NOTE    Katrina Randolph  VHQ:[id number] DOB: August 01, 1930 DOA: 10/12/2019 PCP: [name], PA-C    Chief Complaint  Patient presents with  . Dizziness  . Shortness of Breath  . Atrial Fibrillation    Brief Narrative:  History of chronic A. fib for 10 years not on any rate control medication not anticoagulation prior to this hospitalization History of anemia require blood transfusions She also has history of hypothyroidism on thyroid supplement,  Presented to ED due to short of breath with dizziness for the last few weeks  Report chronic intermittent cough due to lisinopril, chest x-ray showed left lower lobe pneumonia Is no fever, no hypoxia  Subjective:  Off Cardizem drip, remains in A. Fib, heart rate mostly at low 100's, bp low normal She denies chest pain at rest Has intermittent nonproductive cough last night, none this morning, no hypoxia, no fever No lower extremity edema C/o right side neck spasm, reports ativan made her confused Daughter at bedside   Assessment & Plan:   Principal Problem:   Rapid atrial fibrillation (HCC) Active Problems:   Pneumonia   Anemia requiring transfusions   DNR (do not resuscitate)   Acquired thrombophilia ([place])   Afib/RVR - with Acquired thrombophilia secondary to chronic afib, she is  not on anticoagulation due to h/o transfusion dependent anemia and risk of falls -she is not on any rate limiting medication , she denies history of bradycardia, she does reports her blood pressure tends to run low -she is off cardizem drip, currently on lopressor -High sensitivity troponin 10, BNP 344 -Echocardiogram lvef 40-45%, severe bilateral atrial enlargement -Cardiology consulted, management per cardiology  Lobar pneumonia Left lower lobe infiltrate on cxr,  Continue Rocephin and Zithromax for now Check Urine strep pneumo antigen and procalcitonin    HTN Currently on lopressor and losartan , bp low  normal  Microcytic anemia, she reports was seen by hematologist Dr Truddie Coco for this at [facility] No sign of bleeding Iron store is low, report unremarkable EGD and colonoscopy in the past  retic count is in appropriately low, likely component of anemia of chronic disease  Monitor hemoglobin Will get FOBT, then start iron supplement   Body mass index is 33.06 kg/m.   C/o left side neck pain, topical analgesic Avoid ativan, she was very confused after ativan   FTT: start PT  DVT prophylaxis: enoxaparin (LOVENOX) injection 40 mg Start: 10/12/19 1800   Code Status: DNR Family Communication: Patient and daughter at bedside  Disposition:   Status is: Inpatient  Dispo: The patient is from: Home              Anticipated d/c is to: Home              Anticipated d/c date is:  48hrs, Needs better heart rate control, need PT eval  Consultants:   Cardiology  Procedures:   None  Antimicrobials:   Rocephin and Zithromax     Objective: Vitals:   10/14/19 0500 10/14/19 0600 10/14/19 0800 10/14/19 0845  BP:   100/77   Pulse: 90 61 83 (!) 143  Resp: 16 15 14    Temp:   97.6 F (36.4 C)   TempSrc:   Oral   SpO2: 94% 97% 98%   Weight:      Height:        Intake/Output Summary (Last 24 hours) at 10/14/2019 1144 Last data filed at 10/14/2019 0800 Gross per 24 hour  Intake 984.76  ml  Output 1225 ml  Net -240.24 ml   Filed Weights   10/12/19 1410 10/13/19 0414  Weight: 77.1 kg 82 kg    Examination:  General exam: Frail, but calm, NAD Respiratory system: less Left lower lobe rhonchi , no wheezing,  Respiratory effort normal. Cardiovascular system: S1 & S2 heard, IRRR. No JVD, no murmur, No pedal edema. Gastrointestinal system: Abdomen is nondistended, soft and nontender. No organomegaly or masses felt. Normal bowel sounds heard. Central nervous system: Alert and oriented. No focal neurological deficits. Extremities: Symmetric 5 x 5 power. Skin: No  rashes, lesions or ulcers Psychiatry: Judgement and insight appear normal. Mood & affect appropriate.     Data Reviewed: I have personally reviewed following labs and imaging studies  CBC: Recent Labs  Lab 10/12/19 1427 10/13/19 0244 10/14/19 0300  WBC 7.7 7.1 8.1  NEUTROABS 6.0  --  6.7  HGB 9.5* 8.2* 8.3*  HCT 33.6* 30.4* 30.0*  MCV 76.5* 79.0* 78.1*  PLT 239 193 024    Basic Metabolic Panel: Recent Labs  Lab 10/12/19 1427 10/12/19 1620 10/13/19 0244 10/14/19 0300  NA 143  --  141 142  K 4.9  --  4.6 4.8  CL 112*  --  111 113*  CO2 20*  --  20* 21*  GLUCOSE 105*  --  102* 104*  BUN 18  --  17 15  CREATININE 0.95  --  0.84 0.71  CALCIUM 9.1  --  8.3* 8.2*  MG  --  2.2  --  2.1    GFR: Estimated Creatinine Clearance: 49.2 mL/min (by C-G formula based on SCr of 0.71 mg/dL).  Liver Function Tests: Recent Labs  Lab 10/12/19 1427 10/13/19 0244  AST 21 15  ALT 14 13  ALKPHOS 63 53  BILITOT 1.2 1.2  PROT 7.5 6.4*  ALBUMIN 3.9 3.2*    CBG: No results for input(s): GLUCAP in the last 168 hours.   Recent Results (from the past 240 hour(s))  Culture, blood (Routine X 2) w Reflex to ID Panel     Status: None (Preliminary result)   Collection Time: 10/12/19  4:04 PM   Specimen: BLOOD  Result Value Ref Range Status   Specimen Description   Final    BLOOD LEFT ARM Performed at [facility] [address], [place], [place] [address]    Special Requests   Final    BOTTLES DRAWN AEROBIC AND ANAEROBIC Blood Culture adequate volume Performed at [facility] [address] [place], [place] [address]    Culture   Final    NO GROWTH 2 DAYS Performed at [facility] [address] [place],  [address]    Report Status PENDING  Incomplete  SARS Coronavirus 2 by RT PCR (hospital order, performed in [facility] hospital lab) Nasopharyngeal Nasopharyngeal Swab     Status: None   Collection Time: 10/12/19  4:12  PM   Specimen: Nasopharyngeal Swab  Result Value Ref Range Status   SARS Coronavirus 2 NEGATIVE NEGATIVE Final    Comment: (NOTE) SARS-CoV-2 target nucleic acids are NOT DETECTED.  The SARS-CoV-2 RNA is generally detectable in upper and lower respiratory specimens during the acute phase of infection. The lowest concentration of SARS-CoV-2 viral copies this assay can detect is 250 copies / mL. A negative result does not preclude SARS-CoV-2 infection and should not be used as the sole basis for treatment or other patient management decisions.  A negative result  may occur with improper specimen collection / handling, submission of specimen other than nasopharyngeal swab, presence of viral mutation(s) within the areas targeted by this assay, and inadequate number of viral copies (<250 copies / mL). A negative result must be combined with clinical observations, patient history, and epidemiological information.  Fact Sheet for Patients:   StrictlyIdeas.no  Fact Sheet for Healthcare Providers: BankingDealers.co.za  This test is not yet approved or  cleared by the [place] FDA and has been authorized for detection and/or diagnosis of SARS-CoV-2 by FDA under an Emergency Use Authorization (EUA).  This EUA will remain in effect (meaning this test can be used) for the duration of the COVID-19 declaration under Section 564(b)(1) of the Act, 21 U.S.C. section 360bbb-3(b)(1), unless the authorization is terminated or revoked sooner.  Performed at [facility], [place] [address], [place], [place] [address]   Culture, blood (Routine X 2) w Reflex to ID Panel     Status: None (Preliminary result)   Collection Time: 10/12/19  4:19 PM   Specimen: BLOOD RIGHT FOREARM  Result Value Ref Range Status   Specimen Description   Final    BLOOD RIGHT FOREARM Performed at [facility] [address], [place],  [place] [address]    Special Requests   Final    BOTTLES DRAWN AEROBIC AND ANAEROBIC Blood Culture adequate volume Performed at [facility] [address] [place], [place] [address]    Culture   Final    NO GROWTH 2 DAYS Performed at [facility] [address], [place], [place] [address]    Report Status PENDING  Incomplete  MRSA PCR Screening     Status: Abnormal   Collection Time: 10/12/19  6:41 PM   Specimen: Nasopharyngeal  Result Value Ref Range Status   MRSA by PCR POSITIVE (A) NEGATIVE Final    Comment:        The GeneXpert MRSA Assay (FDA approved for NASAL specimens only), is one component of a comprehensive MRSA colonization surveillance program. It is not intended to diagnose MRSA infection nor to guide or monitor treatment for MRSA infections. RESULT CALLED TO, READ BACK BY AND VERIFIED WITH: MCILQUHAM,D. RN @2103  ON 08.31.2021 BY COHEN,K Performed at [facility], [place] [address] [place], [place] [address]          Radiology Studies: [facility] Chest Port 1 View  Result Date: 10/12/2019 CLINICAL DATA:  Worsened shortness of breath and dizziness over the last 2 weeks. EXAM: PORTABLE CHEST 1 VIEW COMPARISON:  None. FINDINGS: Mild cardiomegaly. Aortic atherosclerotic calcification. The right lung is clear. There is infiltrate and volume loss in the left lower lobe consistent with pneumonia. Possible small amount of pleural fluid on the left. No significant bone finding. IMPRESSION: 1. Left lower lobe pneumonia and volume loss. Possible small amount of pleural fluid on the left. 2. Mild cardiomegaly. Aortic atherosclerosis. Electronically Signed   By: Nelson Chimes M.D.   On: 10/12/2019 15:07   ECHOCARDIOGRAM COMPLETE  Result Date: 10/13/2019    ECHOCARDIOGRAM REPORT   Patient Name:   Katrina Randolph Date of Exam: 10/13/2019 Medical Rec #:  [id number]         Height:       62.0 in Accession #:    [id number]        Weight:       180.8 lb  Date of Birth:  [birth year]  BSA:          1.831 m Patient Age:    86 years          BP:           110/71 mmHg Patient Gender: F                 HR:           75 bpm. Exam Location:  Inpatient Procedure: 2D Echo, 3D Echo and Strain Analysis Indications:    Atrial Fibrillation 427.31 / I48.91  History:        Patient has no prior history of Echocardiogram examinations.                 Arrythmias:Atrial Fibrillation; Risk Factors:Hypertension.                 Anemia, Pneumonia, Acquired thrombophilia, Hypothyroidism.  Sonographer:    Darlina Sicilian RDCS Referring Phys: [place]  1. Left ventricular ejection fraction, by estimation, is 40 to 45%. The left ventricle has mildly decreased function. The left ventricle demonstrates global hypokinesis. Left ventricular diastolic function could not be evaluated.  2. Right ventricular systolic function is normal. The right ventricular size is normal. There is moderately elevated pulmonary artery systolic pressure.  3. Left atrial size was severely dilated.  4. Right atrial size was severely dilated.  5. The mitral valve is normal in structure. Mild mitral valve regurgitation. No evidence of mitral stenosis.  6. The aortic valve is normal in structure. Aortic valve regurgitation is not visualized. No aortic stenosis is present.  7. The inferior vena cava is dilated in size with <50% respiratory variability, suggesting right atrial pressure of 15 mmHg. FINDINGS  Left Ventricle: Left ventricular ejection fraction, by estimation, is 40 to 45%. The left ventricle has mildly decreased function. The left ventricle demonstrates global hypokinesis. The left ventricular internal cavity size was normal in size. There is  no left ventricular hypertrophy. Left ventricular diastolic function could not be evaluated due to atrial fibrillation. Left ventricular diastolic function could not be evaluated. Right Ventricle: The right ventricular size is normal. No  increase in right ventricular wall thickness. Right ventricular systolic function is normal. There is moderately elevated pulmonary artery systolic pressure. The tricuspid regurgitant velocity is 3.09 m/s, and with an assumed right atrial pressure of 15 mmHg, the estimated right ventricular systolic pressure is 62.5 mmHg. Left Atrium: Left atrial size was severely dilated. Right Atrium: Right atrial size was severely dilated. Pericardium: Trivial pericardial effusion is present. The pericardial effusion is circumferential. Mitral Valve: The mitral valve is normal in structure. Normal mobility of the mitral valve leaflets. Mild mitral valve regurgitation, with centrally-directed jet. No evidence of mitral valve stenosis. Tricuspid Valve: The tricuspid valve is normal in structure. Tricuspid valve regurgitation is mild . No evidence of tricuspid stenosis. Aortic Valve: The aortic valve is normal in structure. Aortic valve regurgitation is not visualized. No aortic stenosis is present. Pulmonic Valve: The pulmonic valve was normal in structure. Pulmonic valve regurgitation is not visualized. No evidence of pulmonic stenosis. Aorta: The aortic root is normal in size and structure. Venous: The inferior vena cava is dilated in size with less than 50% respiratory variability, suggesting right atrial pressure of 15 mmHg. IAS/Shunts: No atrial level shunt detected by color flow Doppler.  LEFT VENTRICLE PLAX 2D LVIDd:         4.80 cm LVIDs:  3.90 cm LV PW:         1.00 cm LV IVS:        0.90 cm LVOT diam:     2.00 cm  3D Volume EF: LV SV:         39       3D EF:        44 % LV SV Index:   21       LV EDV:       104 ml LVOT Area:     3.14 cm LV ESV:       58 ml                         LV SV:        46 ml RIGHT VENTRICLE TAPSE (M-mode): 1.2 cm LEFT ATRIUM             Index       RIGHT ATRIUM           Index LA diam:        5.30 cm 2.89 cm/m  RA Area:     31.90 cm LA Vol (A2C):   94.6 ml 51.66 ml/m RA Volume:   112.00  ml 61.16 ml/m LA Vol (A4C):   84.7 ml 46.25 ml/m LA Biplane Vol: 89.7 ml 48.98 ml/m  AORTIC VALVE LVOT Vmax:   65.30 cm/s LVOT Vmean:  49.800 cm/s LVOT VTI:    0.125 m  AORTA Ao Root diam: 3.70 cm MITRAL VALVE                TRICUSPID VALVE MV Area (PHT): 4.15 cm     TR Peak grad:   38.2 mmHg MV Decel Time: 183 msec     TR Vmax:        309.00 cm/s MR Peak grad: 87.6 mmHg MR Mean grad: 53.0 mmHg     SHUNTS MR Vmax:      468.00 cm/s   Systemic VTI:  0.12 m MR Vmean:     345.0 cm/s    Systemic Diam: 2.00 cm MV E velocity: 125.33 cm/s Dani Gobble Croitoru MD Electronically signed by Sanda Klein MD Signature Date/Time: 10/13/2019/12:50:44 PM    Final         Scheduled Meds: . Chlorhexidine Gluconate Cloth  6 each Topical Q0600  . enoxaparin (LOVENOX) injection  40 mg Subcutaneous Q24H  . levothyroxine  100 mcg Oral Q0600  . lidocaine  1 patch Transdermal Q24H  . losartan  25 mg Oral Daily  . mouth rinse  15 mL Mouth Rinse BID  . metoprolol tartrate  12.5 mg Oral Q6H  . mupirocin ointment  1 application Nasal BID  . senna-docusate  1 tablet Oral BID  . sertraline  50 mg Oral Daily   Continuous Infusions: . azithromycin Stopped (10/13/19 1919)  . cefTRIAXone (ROCEPHIN)  IV Stopped (10/13/19 1701)  . diltiazem (CARDIZEM) infusion Stopped (10/13/19 1616)     LOS: 2 days   Time spent: 35 mins Greater than 50% of this time was spent in counseling, explanation of diagnosis, planning of further management, and coordination of care.  I have personally reviewed and interpreted on  10/14/2019 daily labs, tele strips, imagings as discussed above under date review session and assessment and plans.  I reviewed all nursing notes, pharmacy notes, consultant notes,  vitals, pertinent old records  I have discussed plan of care as described above with RN , patient on  10/14/2019  Voice Recognition Viviann Spare dictation system was used to create this note, attempts have been made to correct errors. Please contact  the author with questions and/or clarifications.   Florencia Reasons, MD PhD FACP [facility]  Available via Epic secure chat 7am-7pm for nonurgent issues Please page for urgent issues To page the attending provider between 7A-7P or the covering provider during after hours 7P-7A, please log into the web site [email or web address] and access using universal [place] password for that web site. If you do not have the password, please call the hospital operator.    10/14/2019, 11:44 AM

## 2019-10-14 NOTE — Progress Notes (Signed)
Patient admitted to unit at 1845.  Vitals taken and set up on telemetry.  Call bell within reach.

## 2019-10-15 LAB — CBC WITH DIFFERENTIAL/PLATELET
Abs Immature Granulocytes: 0.05 10*3/uL (ref 0.00–0.07)
Basophils Absolute: 0.1 10*3/uL (ref 0.0–0.1)
Basophils Relative: 1 %
Eosinophils Absolute: 0.4 10*3/uL (ref 0.0–0.5)
Eosinophils Relative: 4 %
HCT: 30.4 % — ABNORMAL LOW (ref 36.0–46.0)
Hemoglobin: 8.9 g/dL — ABNORMAL LOW (ref 12.0–15.0)
Immature Granulocytes: 1 %
Lymphocytes Relative: 13 %
Lymphs Abs: 1.1 10*3/uL (ref 0.7–4.0)
MCH: 22.3 pg — ABNORMAL LOW (ref 26.0–34.0)
MCHC: 29.3 g/dL — ABNORMAL LOW (ref 30.0–36.0)
MCV: 76 fL — ABNORMAL LOW (ref 80.0–100.0)
Monocytes Absolute: 0.7 10*3/uL (ref 0.1–1.0)
Monocytes Relative: 9 %
Neutro Abs: 5.9 10*3/uL (ref 1.7–7.7)
Neutrophils Relative %: 72 %
Platelets: 217 10*3/uL (ref 150–400)
RBC: 4 MIL/uL (ref 3.87–5.11)
RDW: 19.9 % — ABNORMAL HIGH (ref 11.5–15.5)
WBC: 8.2 10*3/uL (ref 4.0–10.5)
nRBC: 0 % (ref 0.0–0.2)

## 2019-10-15 LAB — BASIC METABOLIC PANEL
Anion gap: 10 (ref 5–15)
Anion gap: 9 (ref 5–15)
BUN: 17 mg/dL (ref 8–23)
BUN: 17 mg/dL (ref 8–23)
CO2: 20 mmol/L — ABNORMAL LOW (ref 22–32)
CO2: 20 mmol/L — ABNORMAL LOW (ref 22–32)
Calcium: 8.3 mg/dL — ABNORMAL LOW (ref 8.9–10.3)
Calcium: 8.3 mg/dL — ABNORMAL LOW (ref 8.9–10.3)
Chloride: 109 mmol/L (ref 98–111)
Chloride: 108 mmol/L (ref 98–111)
Creatinine, Ser: 0.84 mg/dL (ref 0.44–1.00)
Creatinine, Ser: 0.85 mg/dL (ref 0.44–1.00)
GFR calc Af Amer: >60 mL/min (ref >60)
GFR calc Af Amer: >60 mL/min (ref >60)
GFR calc non Af Amer: >60 mL/min (ref >60)
GFR calc non Af Amer: >60 mL/min (ref >60)
Glucose, Bld: 102 mg/dL — ABNORMAL HIGH (ref 70–99)
Glucose, Bld: 157 mg/dL — ABNORMAL HIGH (ref 70–99)
Potassium: 5.3 mmol/L — ABNORMAL HIGH (ref 3.5–5.1)
Potassium: 4.8 mmol/L (ref 3.5–5.1)
Sodium: 139 mmol/L (ref 135–145)
Sodium: 137 mmol/L (ref 135–145)

## 2019-10-15 LAB — OCCULT BLOOD X 1 CARD TO LAB, STOOL: Fecal Occult Bld: POSITIVE — AB

## 2019-10-15 LAB — PROCALCITONIN: Procalcitonin: <0.1 ng/mL

## 2019-10-15 MED ORDER — GUAIFENESIN ER 600 MG PO TB12
600.0000 mg | ORAL_TABLET | Freq: Two times a day (BID) | ORAL | Status: DC
  Administered 2019-10-17 – 2019-10-20 (×4): 600 mg via ORAL
  Filled 2019-10-15 (×8): qty 1

## 2019-10-15 MED ORDER — SODIUM CHLORIDE 0.9% FLUSH: 10.0000 mL | INTRAVENOUS | Status: DC | PRN

## 2019-10-15 MED ORDER — CHLORHEXIDINE GLUCONATE CLOTH 2 % EX PADS
6.0000 | MEDICATED_PAD | Freq: Every day | CUTANEOUS | Status: DC
  Administered 2019-10-16 – 2019-10-20 (×4): 6 via TOPICAL

## 2019-10-15 MED ORDER — LOSARTAN POTASSIUM 25 MG PO TABS
25.0000 mg | ORAL_TABLET | Freq: Every day | ORAL | Status: DC
  Administered 2019-10-15 – 2019-10-18 (×4): 25 mg via ORAL
  Filled 2019-10-15 (×4): qty 1

## 2019-10-15 MED ORDER — SODIUM CHLORIDE 0.9% FLUSH
10.0000 mL | Freq: Two times a day (BID) | INTRAVENOUS | Status: DC
  Administered 2019-10-15 – 2019-10-17 (×3): 10 mL

## 2019-10-15 MED ORDER — LIDOCAINE VISCOUS HCL 2 % MT SOLN
15.0000 mL | Freq: Once | OROMUCOSAL | Status: AC
  Administered 2019-10-15: 15 mL via ORAL
  Filled 2019-10-15: qty 15

## 2019-10-15 MED ORDER — SODIUM CHLORIDE 0.9 % IV SOLN
2.0000 g | INTRAVENOUS | Status: AC
  Administered 2019-10-15 – 2019-10-18 (×4): 2 g via INTRAVENOUS
  Filled 2019-10-15 (×4): qty 2

## 2019-10-15 MED ORDER — ALUM & MAG HYDROXIDE-SIMETH 200-200-20 MG/5ML PO SUSP
30.0000 mL | Freq: Once | ORAL | Status: AC
  Administered 2019-10-15: 30 mL via ORAL
  Filled 2019-10-15: qty 30

## 2019-10-15 NOTE — Progress Notes (Signed)
Pt's K+ is lower will resume losartan.

## 2019-10-15 NOTE — Care Management Important Message (Signed)
Important Message  Patient Details IM Letter given to the Patient Name: Katrina Randolph MRN: [id number] Date of Birth: [birth year]   Medicare Important Message Given:  Yes     Kerin Salen 10/15/2019, 12:33 PM

## 2019-10-15 NOTE — Telephone Encounter (Signed)
Left message for pt to call.

## 2019-10-15 NOTE — Progress Notes (Addendum)
Progress Note  Patient Name: Katrina Randolph Date of Encounter: 10/15/2019  [facility] Cardiologist: [name], MD   Subjective   Transferred to tele yesterday Today she complains of lower sternal chest pressure,  Just had BM , no pain to palpation   Inpatient Medications    Scheduled Meds: . azithromycin  500 mg Oral q1800  . Chlorhexidine Gluconate Cloth  6 each Topical Q0600  . enoxaparin (LOVENOX) injection  40 mg Subcutaneous Q24H  . levothyroxine  100 mcg Oral Q0600  . lidocaine  1 patch Transdermal Q24H  . mouth rinse  15 mL Mouth Rinse BID  . metoprolol succinate  75 mg Oral Daily  . mupirocin ointment  1 application Nasal BID  . senna-docusate  1 tablet Oral BID  . sertraline  50 mg Oral Daily   Continuous Infusions: . cefTRIAXone (ROCEPHIN)  IV 2 g (10/14/19 2041)  . diltiazem (CARDIZEM) infusion Stopped (10/13/19 1616)   PRN Meds: acetaminophen **OR** acetaminophen, cyclobenzaprine, furosemide, ondansetron (ZOFRAN) IV, traMADol, traZODone   Vital Signs    Vitals:   10/14/19 1908 10/14/19 2002 10/15/19 0043 10/15/19 0457  BP:  (!) 109/92 96/74 126/68  Pulse:  84 81 91  Resp:  20 20 20   Temp:  98.4 F (36.9 C) 98.4 F (36.9 C) 98.6 F (37 C)  TempSrc:  Oral  Oral  SpO2: 95% 97% 95% 93%  Weight:      Height:        Intake/Output Summary (Last 24 hours) at 10/15/2019 0930 Last data filed at 10/15/2019 0507 Gross per 24 hour  Intake 60 ml  Output 200 ml  Net -140 ml   Last 3 Weights 10/13/2019 10/12/2019  Weight (lbs) 180 lb 12.4 oz 170 lb  Weight (kg) 82 kg 77.111 kg      Telemetry    Atrial fib HR mostly in the upper 90s to lower 100s.  - Personally Reviewed  ECG    No new - Personally Reviewed  Physical Exam   GEN: No acute distress.   Neck: No JVD Cardiac: irreg irreg , no murmurs, rubs, or gallops. No increase of pain to palpation  Respiratory: few rales in bases to auscultation bilaterally. GI: Soft, nontender,  non-distended  MS: No edema; No deformity. Neuro:  Nonfocal  Psych: Normal affect   Labs    High Sensitivity Troponin:   Recent Labs  Lab 10/12/19 1427 10/12/19 1620  TROPONINIHS 10 10      Chemistry Recent Labs  Lab 10/12/19 1427 10/12/19 1427 10/13/19 0244 10/14/19 0300 10/15/19 0541  NA 143   < > 141 142 139  K 4.9   < > 4.6 4.8 5.3*  CL 112*   < > 111 113* 109  CO2 20*   < > 20* 21* 20*  GLUCOSE 105*   < > 102* 104* 102*  BUN 18   < > 17 15 17   CREATININE 0.95   < > 0.84 0.71 0.84  CALCIUM 9.1   < > 8.3* 8.2* 8.3*  PROT 7.5  --  6.4*  --   --   ALBUMIN 3.9  --  3.2*  --   --   AST 21  --  15  --   --   ALT 14  --  13  --   --   ALKPHOS 63  --  53  --   --   BILITOT 1.2  --  1.2  --   --  GFRNONAA 54*   < > >60 >60 >60  GFRAA >60   < > >60 >60 >60  ANIONGAP 11   < > 10 8 10    < > = values in this interval not displayed.     Hematology Recent Labs  Lab 10/13/19 0244 10/14/19 0300 10/15/19 0541  WBC 7.1 8.1 8.2  RBC 3.85* 3.84*  3.72* 4.00  HGB 8.2* 8.3* 8.9*  HCT 30.4* 30.0* 30.4*  MCV 79.0* 78.1* 76.0*  MCH 21.3* 21.6* 22.3*  MCHC 27.0* 27.7* 29.3*  RDW 20.3* 19.9* 19.9*  PLT 193 192 217    BNP Recent Labs  Lab 10/12/19 1427  BNP 344.5*     DDimer No results for input(s): DDIMER in the last 168 hours.   Radiology    ECHOCARDIOGRAM COMPLETE  Result Date: 10/13/2019    ECHOCARDIOGRAM REPORT   Patient Name:   TANESHA ARAMBULA Date of Exam: 10/13/2019 Medical Rec #:  [id number]         Height:       62.0 in Accession #:    [id number]        Weight:       180.8 lb Date of Birth:  Jan 06, 1931         BSA:          1.831 m Patient Age:    29 years          BP:           110/71 mmHg Patient Gender: F                 HR:           75 bpm. Exam Location:  Inpatient Procedure: 2D Echo, 3D Echo and Strain Analysis Indications:    Atrial Fibrillation 427.31 / I48.91  History:        Patient has no prior history of Echocardiogram examinations.                  Arrythmias:Atrial Fibrillation; Risk Factors:Hypertension.                 Anemia, Pneumonia, Acquired thrombophilia, Hypothyroidism.  Sonographer:    Darlina Sicilian RDCS Referring Phys: [place]  1. Left ventricular ejection fraction, by estimation, is 40 to 45%. The left ventricle has mildly decreased function. The left ventricle demonstrates global hypokinesis. Left ventricular diastolic function could not be evaluated.  2. Right ventricular systolic function is normal. The right ventricular size is normal. There is moderately elevated pulmonary artery systolic pressure.  3. Left atrial size was severely dilated.  4. Right atrial size was severely dilated.  5. The mitral valve is normal in structure. Mild mitral valve regurgitation. No evidence of mitral stenosis.  6. The aortic valve is normal in structure. Aortic valve regurgitation is not visualized. No aortic stenosis is present.  7. The inferior vena cava is dilated in size with <50% respiratory variability, suggesting right atrial pressure of 15 mmHg. FINDINGS  Left Ventricle: Left ventricular ejection fraction, by estimation, is 40 to 45%. The left ventricle has mildly decreased function. The left ventricle demonstrates global hypokinesis. The left ventricular internal cavity size was normal in size. There is  no left ventricular hypertrophy. Left ventricular diastolic function could not be evaluated due to atrial fibrillation. Left ventricular diastolic function could not be evaluated. Right Ventricle: The right ventricular size is normal. No increase in right ventricular wall thickness. Right ventricular systolic function is normal.  There is moderately elevated pulmonary artery systolic pressure. The tricuspid regurgitant velocity is 3.09 m/s, and with an assumed right atrial pressure of 15 mmHg, the estimated right ventricular systolic pressure is 54.6 mmHg. Left Atrium: Left atrial size was severely dilated. Right Atrium: Right  atrial size was severely dilated. Pericardium: Trivial pericardial effusion is present. The pericardial effusion is circumferential. Mitral Valve: The mitral valve is normal in structure. Normal mobility of the mitral valve leaflets. Mild mitral valve regurgitation, with centrally-directed jet. No evidence of mitral valve stenosis. Tricuspid Valve: The tricuspid valve is normal in structure. Tricuspid valve regurgitation is mild . No evidence of tricuspid stenosis. Aortic Valve: The aortic valve is normal in structure. Aortic valve regurgitation is not visualized. No aortic stenosis is present. Pulmonic Valve: The pulmonic valve was normal in structure. Pulmonic valve regurgitation is not visualized. No evidence of pulmonic stenosis. Aorta: The aortic root is normal in size and structure. Venous: The inferior vena cava is dilated in size with less than 50% respiratory variability, suggesting right atrial pressure of 15 mmHg. IAS/Shunts: No atrial level shunt detected by color flow Doppler.  LEFT VENTRICLE PLAX 2D LVIDd:         4.80 cm LVIDs:         3.90 cm LV PW:         1.00 cm LV IVS:        0.90 cm LVOT diam:     2.00 cm  3D Volume EF: LV SV:         39       3D EF:        44 % LV SV Index:   21       LV EDV:       104 ml LVOT Area:     3.14 cm LV ESV:       58 ml                         LV SV:        46 ml RIGHT VENTRICLE TAPSE (M-mode): 1.2 cm LEFT ATRIUM             Index       RIGHT ATRIUM           Index LA diam:        5.30 cm 2.89 cm/m  RA Area:     31.90 cm LA Vol (A2C):   94.6 ml 51.66 ml/m RA Volume:   112.00 ml 61.16 ml/m LA Vol (A4C):   84.7 ml 46.25 ml/m LA Biplane Vol: 89.7 ml 48.98 ml/m  AORTIC VALVE LVOT Vmax:   65.30 cm/s LVOT Vmean:  49.800 cm/s LVOT VTI:    0.125 m  AORTA Ao Root diam: 3.70 cm MITRAL VALVE                TRICUSPID VALVE MV Area (PHT): 4.15 cm     TR Peak grad:   38.2 mmHg MV Decel Time: 183 msec     TR Vmax:        309.00 cm/s MR Peak grad: 87.6 mmHg MR Mean grad:  53.0 mmHg     SHUNTS MR Vmax:      468.00 cm/s   Systemic VTI:  0.12 m MR Vmean:     345.0 cm/s    Systemic Diam: 2.00 cm MV E velocity: 125.33 cm/s Dani Gobble Croitoru MD Electronically signed by Sanda Klein MD Signature Date/Time: 10/13/2019/12:50:44  PM    Final     Cardiac Studies   Echo 10/13/19 IMPRESSIONS    1. Left ventricular ejection fraction, by estimation, is 40 to 45%. The  left ventricle has mildly decreased function. The left ventricle  demonstrates global hypokinesis. Left ventricular diastolic function could  not be evaluated.  2. Right ventricular systolic function is normal. The right ventricular  size is normal. There is moderately elevated pulmonary artery systolic  pressure.  3. Left atrial size was severely dilated.  4. Right atrial size was severely dilated.  5. The mitral valve is normal in structure. Mild mitral valve  regurgitation. No evidence of mitral stenosis.  6. The aortic valve is normal in structure. Aortic valve regurgitation is  not visualized. No aortic stenosis is present.  7. The inferior vena cava is dilated in size with <50% respiratory  variability, suggesting right atrial pressure of 15 mmHg.   FINDINGS  Left Ventricle: Left ventricular ejection fraction, by estimation, is 40  to 45%. The left ventricle has mildly decreased function. The left  ventricle demonstrates global hypokinesis. The left ventricular internal  cavity size was normal in size. There is  no left ventricular hypertrophy. Left ventricular diastolic function  could not be evaluated due to atrial fibrillation. Left ventricular  diastolic function could not be evaluated.   Right Ventricle: The right ventricular size is normal. No increase in  right ventricular wall thickness. Right ventricular systolic function is  normal. There is moderately elevated pulmonary artery systolic pressure.  The tricuspid regurgitant velocity is  3.09 m/s, and with an assumed right atrial  pressure of 15 mmHg, the  estimated right ventricular systolic pressure is 35.3 mmHg.   Left Atrium: Left atrial size was severely dilated.   Right Atrium: Right atrial size was severely dilated.   Pericardium: Trivial pericardial effusion is present. The pericardial  effusion is circumferential.   Mitral Valve: The mitral valve is normal in structure. Normal mobility of  the mitral valve leaflets. Mild mitral valve regurgitation, with  centrally-directed jet. No evidence of mitral valve stenosis.   Tricuspid Valve: The tricuspid valve is normal in structure. Tricuspid  valve regurgitation is mild . No evidence of tricuspid stenosis.   Aortic Valve: The aortic valve is normal in structure. Aortic valve  regurgitation is not visualized. No aortic stenosis is present.   Pulmonic Valve: The pulmonic valve was normal in structure. Pulmonic valve  regurgitation is not visualized. No evidence of pulmonic stenosis.   Aorta: The aortic root is normal in size and structure.   Venous: The inferior vena cava is dilated in size with less than 50%  respiratory variability, suggesting right atrial pressure of 15 mmHg.   IAS/Shunts: No atrial level shunt detected by color flow Doppler.   Patient Profile     86 y.o. female now with admit for PNA with a fib RVR, anemia with need for transfusions, HTN, hypothyroidism.    Assessment & Plan    Atrial fibrillation: most likely chronic given size of LA -Diagnosed over 10 years ago in [place]. Had previous echocardiogram and Myoview by her cardiologist which were reportedly normal. She had cardiac catheterization at [facility] around 10 years ago as well prior to lumpectomy and was told she has normal coronary arteries. I do not have any of the above record. Family has been trying to obtain records for the past 2 years without success as all of her previous doctors including PCP, hematologist and cardiologist has  all moved away from the area. --EF decreased to 40-45%   would not use dilt as outpt with decreased EF, on BB with slight increase yesterday.   -Her previous cardiologist and hematologist has made a joint decision not to place her on anticoagulation therapy due to her chronic anemia requiring iron transfusion. She has undergonemultiple GI work-up in the past without identifiable cause. -Current hemoglobin is 8.3. We will hold off on placing her on anticoagulation therapy.  2. Left lower lobe pneumonia: Seen on chest x-ray. Received antibiotic.per IM  3. Chronic anemia: No prior history of blood transfusion. Has received multiple courses of iron infusion therapy in the past. -According to the patient, she has had both upper endoscopy and colonoscopy in the past and was unable to identify a cause for the anemia. -Her previous hematologist and PCP have made a joint decision to stop her anticoagulation due to unidentified source for the anemia. --hgb 8.9  Down from 9.5     4. Hypothyroidism: Elevated TSH. Will defer to primary team to check free T4. On levothyroxine at home  5. Hypertension: On lisinopril at home.now on losartan and BB bp 96/74 to 126/68 6. Hyperkalemia at 5.3,  Held losartan today , not receiving K+.  She was on lisinopril at home, her Cr is stable at 0.84  7. H/o heart murmur:She reportedly had history of heart murmur and moderate valve leakage, I did not appreciate significant heart murmur on physical exam. Mild MR on echo. 8. + MRSA by PCR, and urine + strep pneumo   9.  Chest discomfort after eating.  Will check EKG and give GI cocktail, I talked with the nurse, she will call if no improvement       For questions or updates, please contact [place] Please consult [email or web address] for contact info under   History and all data above reviewed.  Patient examined.  I agree with the findings  as above.   Atypical chest pain.  I reviewed the EKG just done and there were no acute changes.   The patient exam reveals VEZ:[id number]  ,  Lungs: Clear  ,  Abd: Positive bowel sounds, no rebound no guarding, Ext No edema   .  All available labs, radiology testing, previous records reviewed. Agree with documented assessment and plan.   CHEST PAIN:  No clear anginal etiology.  Treat as above with GI cocktail.  ATRIAL FIB:  Rate is OK.  I have adjusted beta blocker.  Continue current therapy.  CARDIOMYOPATHY:  Plan medical management.  Repeat potassium as this might be spurious.  If OK resume Cozaar.    Jeneen Rinks Raha Tennison  10:52 AM  10/15/2019       Signed, Cecilie Kicks, NP  10/15/2019, 9:30 AM

## 2019-10-15 NOTE — Progress Notes (Signed)
PROGRESS NOTE    Katrina Randolph  WIO:[id number] DOB: [birth year] DOA: 10/12/2019 PCP: [name], PA-C    Chief Complaint  Patient presents with  . Dizziness  . Shortness of Breath  . Atrial Fibrillation    Brief Narrative:  History of chronic A. fib for 10 years not on any rate control medication not anticoagulation prior to this hospitalization History of anemia require blood transfusions She also has history of hypothyroidism on thyroid supplement,  Presented to ED due to short of breath with dizziness for the last few weeks  Report chronic intermittent cough due to lisinopril, chest x-ray showed left lower lobe pneumonia Is no fever, no hypoxia  Subjective:  Heart rate is much better controlled, She denies chest pain at rest Still having some cough,  no hypoxia, no fever No lower extremity edema State lidocaine patch helped right-sided neck pain,    Assessment & Plan:   Principal Problem:   Rapid atrial fibrillation (HCC) Active Problems:   Pneumonia   Anemia requiring transfusions   DNR (do not resuscitate)   Acquired thrombophilia ([place])   Afib/RVR - with Acquired thrombophilia secondary to chronic afib, she is  not on anticoagulation due to h/o transfusion dependent anemia and risk of falls -she is not on any rate limiting medication , she denies history of bradycardia, she does reports her blood pressure tends to run low- -High sensitivity troponin 10, BNP 344 -Echocardiogram lvef 40-45%, severe bilateral atrial enlargement -she is off cardizem drip, currently on lopressor -Cardiology consulted, management per cardiology  Lobar pneumonia Left lower lobe infiltrate on cxr,  + Urine strep pneumo antigen  Continue Rocephin and Zithromax for total of 5 days  HTN Currently on lopressor , losartan     Microcytic anemia, she reports was seen by hematologist Dr Truddie Coco for this at [facility] No sign of bleeding Iron store is low,  report unremarkable EGD and colonoscopy in the past  retic count is in appropriately low, likely component of anemia of chronic disease    5:30 pm addendum: FOBT +, stool is brown in color, plan to start iron supplement at discharge Case discussed with [facility] Dr. Michail Sermon who is going to see the patient tomorrow  Hypothyroidism, continue Synthroid supplement   Body mass index is 33.06 kg/m.   C/o left side neck pain, topical analgesic Avoid ativan, she was very confused after ativan   FTT: PT recommended home health, transitional care order placed  DVT prophylaxis: enoxaparin (LOVENOX) injection 40 mg Start: 10/12/19 1800   Code Status: DNR Family Communication:  daughter at bedside on September 2 Disposition:   Status is: Inpatient  Dispo: The patient is from: Home              Anticipated d/c is to: Home              Anticipated d/c date is:  TBD, needs gi consult  Consultants:   Cardiology  [facility]  Procedures:   None  Antimicrobials:   Rocephin and Zithromax     Objective: Vitals:   10/14/19 1908 10/14/19 2002 10/15/19 0043 10/15/19 0457  BP:  (!) 109/92 96/74 126/68  Pulse:  84 81 91  Resp:  20 20 20   Temp:  98.4 F (36.9 C) 98.4 F (36.9 C) 98.6 F (37 C)  TempSrc:  Oral  Oral  SpO2: 95% 97% 95% 93%  Weight:      Height:  Intake/Output Summary (Last 24 hours) at 10/15/2019 0855 Last data filed at 10/15/2019 0507 Gross per 24 hour  Intake 60 ml  Output 200 ml  Net -140 ml   Filed Weights   10/12/19 1410 10/13/19 0414  Weight: 77.1 kg 82 kg    Examination:  General exam: Frail, but calm, NAD Respiratory system: less Left lower lobe rhonchi , no wheezing,  Respiratory effort normal. Cardiovascular system: S1 & S2 heard, IRRR. No JVD, no murmur, No pedal edema. Gastrointestinal system: Abdomen is nondistended, soft and nontender. No organomegaly or masses felt. Normal bowel sounds heard. Central nervous system: Alert and  oriented. No focal neurological deficits. Extremities: Symmetric 5 x 5 power. Skin: No rashes, lesions or ulcers Psychiatry: Judgement and insight appear normal. Mood & affect appropriate.     Data Reviewed: I have personally reviewed following labs and imaging studies  CBC: Recent Labs  Lab 10/12/19 1427 10/13/19 0244 10/14/19 0300 10/15/19 0541  WBC 7.7 7.1 8.1 8.2  NEUTROABS 6.0  --  6.7 5.9  HGB 9.5* 8.2* 8.3* 8.9*  HCT 33.6* 30.4* 30.0* 30.4*  MCV 76.5* 79.0* 78.1* 76.0*  PLT 239 193 192 696    Basic Metabolic Panel: Recent Labs  Lab 10/12/19 1427 10/12/19 1620 10/13/19 0244 10/14/19 0300 10/15/19 0541  NA 143  --  141 142 139  K 4.9  --  4.6 4.8 5.3*  CL 112*  --  111 113* 109  CO2 20*  --  20* 21* 20*  GLUCOSE 105*  --  102* 104* 102*  BUN 18  --  17 15 17   CREATININE 0.95  --  0.84 0.71 0.84  CALCIUM 9.1  --  8.3* 8.2* 8.3*  MG  --  2.2  --  2.1  --     GFR: Estimated Creatinine Clearance: 46.9 mL/min (by C-G formula based on SCr of 0.84 mg/dL).  Liver Function Tests: Recent Labs  Lab 10/12/19 1427 10/13/19 0244  AST 21 15  ALT 14 13  ALKPHOS 63 53  BILITOT 1.2 1.2  PROT 7.5 6.4*  ALBUMIN 3.9 3.2*    CBG: No results for input(s): GLUCAP in the last 168 hours.   Recent Results (from the past 240 hour(s))  Culture, blood (Routine X 2) w Reflex to ID Panel     Status: None (Preliminary result)   Collection Time: 10/12/19  4:04 PM   Specimen: BLOOD  Result Value Ref Range Status   Specimen Description   Final    BLOOD LEFT ARM Performed at [facility] [address], [place], [place] [address]    Special Requests   Final    BOTTLES DRAWN AEROBIC AND ANAEROBIC Blood Culture adequate volume Performed at [facility] [address] [place], [place] [address]    Culture   Final    NO GROWTH 3 DAYS Performed at [facility], [place] [address] [place], [place] [address]    Report Status PENDING   Incomplete  SARS Coronavirus 2 by RT PCR (hospital order, performed in [facility] hospital lab) Nasopharyngeal Nasopharyngeal Swab     Status: None   Collection Time: 10/12/19  4:12 PM   Specimen: Nasopharyngeal Swab  Result Value Ref Range Status   SARS Coronavirus 2 NEGATIVE NEGATIVE Final    Comment: (NOTE) SARS-CoV-2 target nucleic acids are NOT DETECTED.  The SARS-CoV-2 RNA is generally detectable in upper and lower respiratory specimens during the acute phase of infection. The lowest  concentration of SARS-CoV-2 viral copies this assay can detect is 250 copies / mL. A negative result does not preclude SARS-CoV-2 infection and should not be used as the sole basis for treatment or other patient management decisions.  A negative result may occur with improper specimen collection / handling, submission of specimen other than nasopharyngeal swab, presence of viral mutation(s) within the areas targeted by this assay, and inadequate number of viral copies (<250 copies / mL). A negative result must be combined with clinical observations, patient history, and epidemiological information.  Fact Sheet for Patients:   StrictlyIdeas.no  Fact Sheet for Healthcare Providers: BankingDealers.co.za  This test is not yet approved or  cleared by the [place] FDA and has been authorized for detection and/or diagnosis of SARS-CoV-2 by FDA under an Emergency Use Authorization (EUA).  This EUA will remain in effect (meaning this test can be used) for the duration of the COVID-19 declaration under Section 564(b)(1) of the Act, 21 U.S.C. section 360bbb-3(b)(1), unless the authorization is terminated or revoked sooner.  Performed at [facility], [place] [address], [place], [place] [address]   Culture, blood (Routine X 2) w Reflex to ID Panel     Status: None (Preliminary result)   Collection Time: 10/12/19  4:19 PM   Specimen:  BLOOD RIGHT FOREARM  Result Value Ref Range Status   Specimen Description   Final    BLOOD RIGHT FOREARM Performed at [facility] [address] [place], [place] [address]    Special Requests   Final    BOTTLES DRAWN AEROBIC AND ANAEROBIC Blood Culture adequate volume Performed at [facility] [address], [place], [place] [address]    Culture   Final    NO GROWTH 3 DAYS Performed at [facility], [place] [address], [place], [place] [address]    Report Status PENDING  Incomplete  MRSA PCR Screening     Status: Abnormal   Collection Time: 10/12/19  6:41 PM   Specimen: Nasopharyngeal  Result Value Ref Range Status   MRSA by PCR POSITIVE (A) NEGATIVE Final    Comment:        The GeneXpert MRSA Assay (FDA approved for NASAL specimens only), is one component of a comprehensive MRSA colonization surveillance program. It is not intended to diagnose MRSA infection nor to guide or monitor treatment for MRSA infections. RESULT CALLED TO, READ BACK BY AND VERIFIED WITH: MCILQUHAM,D. RN @2103  ON 08.31.2021 BY COHEN,K Performed at [facility], [place] [address] [place], [place] [address]          Radiology Studies: ECHOCARDIOGRAM COMPLETE  Result Date: 10/13/2019    ECHOCARDIOGRAM REPORT   Patient Name:   YEKATERINA ESCUTIA Date of Exam: 10/13/2019 Medical Rec #:  [id number]         Height:       62.0 in Accession #:    [id number]        Weight:       180.8 lb Date of Birth:  03-Jan-1931         BSA:          1.831 m Patient Age:    86 years          BP:           110/71 mmHg Patient Gender: F                 HR:  75 bpm. Exam Location:  Inpatient Procedure: 2D Echo, 3D Echo and Strain Analysis Indications:    Atrial Fibrillation 427.31 / I48.91  History:        Patient has no prior history of Echocardiogram examinations.                 Arrythmias:Atrial Fibrillation; Risk Factors:Hypertension.                 Anemia,  Pneumonia, Acquired thrombophilia, Hypothyroidism.  Sonographer:    Darlina Sicilian RDCS Referring Phys: [place]  1. Left ventricular ejection fraction, by estimation, is 40 to 45%. The left ventricle has mildly decreased function. The left ventricle demonstrates global hypokinesis. Left ventricular diastolic function could not be evaluated.  2. Right ventricular systolic function is normal. The right ventricular size is normal. There is moderately elevated pulmonary artery systolic pressure.  3. Left atrial size was severely dilated.  4. Right atrial size was severely dilated.  5. The mitral valve is normal in structure. Mild mitral valve regurgitation. No evidence of mitral stenosis.  6. The aortic valve is normal in structure. Aortic valve regurgitation is not visualized. No aortic stenosis is present.  7. The inferior vena cava is dilated in size with <50% respiratory variability, suggesting right atrial pressure of 15 mmHg. FINDINGS  Left Ventricle: Left ventricular ejection fraction, by estimation, is 40 to 45%. The left ventricle has mildly decreased function. The left ventricle demonstrates global hypokinesis. The left ventricular internal cavity size was normal in size. There is  no left ventricular hypertrophy. Left ventricular diastolic function could not be evaluated due to atrial fibrillation. Left ventricular diastolic function could not be evaluated. Right Ventricle: The right ventricular size is normal. No increase in right ventricular wall thickness. Right ventricular systolic function is normal. There is moderately elevated pulmonary artery systolic pressure. The tricuspid regurgitant velocity is 3.09 m/s, and with an assumed right atrial pressure of 15 mmHg, the estimated right ventricular systolic pressure is 32.9 mmHg. Left Atrium: Left atrial size was severely dilated. Right Atrium: Right atrial size was severely dilated. Pericardium: Trivial pericardial effusion is  present. The pericardial effusion is circumferential. Mitral Valve: The mitral valve is normal in structure. Normal mobility of the mitral valve leaflets. Mild mitral valve regurgitation, with centrally-directed jet. No evidence of mitral valve stenosis. Tricuspid Valve: The tricuspid valve is normal in structure. Tricuspid valve regurgitation is mild . No evidence of tricuspid stenosis. Aortic Valve: The aortic valve is normal in structure. Aortic valve regurgitation is not visualized. No aortic stenosis is present. Pulmonic Valve: The pulmonic valve was normal in structure. Pulmonic valve regurgitation is not visualized. No evidence of pulmonic stenosis. Aorta: The aortic root is normal in size and structure. Venous: The inferior vena cava is dilated in size with less than 50% respiratory variability, suggesting right atrial pressure of 15 mmHg. IAS/Shunts: No atrial level shunt detected by color flow Doppler.  LEFT VENTRICLE PLAX 2D LVIDd:         4.80 cm LVIDs:         3.90 cm LV PW:         1.00 cm LV IVS:        0.90 cm LVOT diam:     2.00 cm  3D Volume EF: LV SV:         39       3D EF:        44 % LV SV Index:  21       LV EDV:       104 ml LVOT Area:     3.14 cm LV ESV:       58 ml                         LV SV:        46 ml RIGHT VENTRICLE TAPSE (M-mode): 1.2 cm LEFT ATRIUM             Index       RIGHT ATRIUM           Index LA diam:        5.30 cm 2.89 cm/m  RA Area:     31.90 cm LA Vol (A2C):   94.6 ml 51.66 ml/m RA Volume:   112.00 ml 61.16 ml/m LA Vol (A4C):   84.7 ml 46.25 ml/m LA Biplane Vol: 89.7 ml 48.98 ml/m  AORTIC VALVE LVOT Vmax:   65.30 cm/s LVOT Vmean:  49.800 cm/s LVOT VTI:    0.125 m  AORTA Ao Root diam: 3.70 cm MITRAL VALVE                TRICUSPID VALVE MV Area (PHT): 4.15 cm     TR Peak grad:   38.2 mmHg MV Decel Time: 183 msec     TR Vmax:        309.00 cm/s MR Peak grad: 87.6 mmHg MR Mean grad: 53.0 mmHg     SHUNTS MR Vmax:      468.00 cm/s   Systemic VTI:  0.12 m MR Vmean:      345.0 cm/s    Systemic Diam: 2.00 cm MV E velocity: 125.33 cm/s Dani Gobble Croitoru MD Electronically signed by Sanda Klein MD Signature Date/Time: 10/13/2019/12:50:44 PM    Final         Scheduled Meds: . azithromycin  500 mg Oral q1800  . Chlorhexidine Gluconate Cloth  6 each Topical Q0600  . enoxaparin (LOVENOX) injection  40 mg Subcutaneous Q24H  . levothyroxine  100 mcg Oral Q0600  . lidocaine  1 patch Transdermal Q24H  . mouth rinse  15 mL Mouth Rinse BID  . metoprolol succinate  75 mg Oral Daily  . mupirocin ointment  1 application Nasal BID  . senna-docusate  1 tablet Oral BID  . sertraline  50 mg Oral Daily   Continuous Infusions: . cefTRIAXone (ROCEPHIN)  IV 2 g (10/14/19 2041)  . diltiazem (CARDIZEM) infusion Stopped (10/13/19 1616)     LOS: 3 days   Time spent: 35 mins Greater than 50% of this time was spent in counseling, explanation of diagnosis, planning of further management, and coordination of care.  I have personally reviewed and interpreted on  10/15/2019 daily labs, tele strips, imagings as discussed above under date review session and assessment and plans.  I reviewed all nursing notes, pharmacy notes, consultant notes,  vitals, pertinent old records  I have discussed plan of care as described above with RN , patient on 10/15/2019  Voice Recognition /Dragon dictation system was used to create this note, attempts have been made to correct errors. Please contact the author with questions and/or clarifications.   Florencia Reasons, MD PhD FACP [facility]  Available via Epic secure chat 7am-7pm for nonurgent issues Please page for urgent issues To page the attending provider between 7A-7P or the covering provider during after hours 7P-7A, please log into the web site [email or web address] and  access using universal  password for that web site. If you do not have the password, please call the hospital operator.    10/15/2019, 8:55 AM

## 2019-10-15 NOTE — Evaluation (Signed)
Physical Therapy Evaluation Patient Details Name: Katrina Randolph MRN: [id number] DOB: 23-Nov-1930 Today's Date: 10/15/2019   History of Present Illness  86 y.o. female now with admit for PNA with a fib RVR, anemia with need for transfusions, HTN, hypothyroidism.    Clinical Impression  Katrina Randolph is 86 y.o. female admitted with above HPI and diagnosis. Patient is currently limited by functional impairments below (see PT problem list). Patient lives alone and is independent at baseline with occasional use of rollator for mobility. She currently requires min assist for transfers and gait with RW and had to episode of LOB requiring external assist to steady. Patient will benefit from continued skilled PT interventions to address impairments and progress independence with mobility, recommending HHPT with 24/7 assist initially. Acute PT will follow and progress as able.     Follow Up Recommendations Home health PT;Supervision/Assistance - 24 hour    Equipment Recommendations  None recommended by PT (TBA, pt has rollator at home)    Recommendations for Other Services       Precautions / Restrictions Precautions Precautions: Fall Restrictions Weight Bearing Restrictions: No      Mobility  Bed Mobility Overal bed mobility: Needs Assistance Bed Mobility: Supine to Sit     Supine to sit: Min guard;HOB elevated     General bed mobility comments: pt taking increased time and use of bed rail to sit up.   Transfers Overall transfer level: Needs assistance Equipment used: Rolling walker (2 wheeled) Transfers: Sit to/from [facility] Sit to Stand: Min guard Stand pivot transfers: Min assist;Min guard       General transfer comment: cues for safety to use RW with transfer. min gaurd for power up and rise, min assist due to slight LOB with turning/pivoting.   Ambulation/Gait Ambulation/Gait assistance: Min assist Gait Distance (Feet): 32 Feet (2x16 (to  bathroom and back to recliner)) Assistive device: Rolling walker (2 wheeled) Gait Pattern/deviations: Step-through pattern;Decreased step length - right;Decreased step length - left;Decreased stride length;Trunk flexed Gait velocity: decr   General Gait Details: cues for safe proximity to RW throughout and assist to steady. Pt attempting to lift RW from floor and requried assist to prevent forward LOB.   Stairs            Wheelchair Mobility    Modified Rankin (Stroke Patients Only)       Balance Overall balance assessment: Needs assistance Sitting-balance support: Feet supported Sitting balance-Leahy Scale: Good Sitting balance - Comments: pt performed pericare sitting and donned underwear sitting on commode.   Standing balance support: During functional activity;Bilateral upper extremity supported Standing balance-Leahy Scale: Fair                Pertinent Vitals/Pain Pain Assessment: No/denies pain    Home Living Family/patient expects to be discharged to:: Private residence Living Arrangements: Alone Available Help at Discharge: Family Type of Home: House Home Access: Stairs to enter Entrance Stairs-Rails: None Entrance Stairs-Number of Steps: 1 Home Layout: One level Home Equipment: Environmental consultant - 4 wheels;Cane - single point;Shower seat - built in Additional Comments: daughter lives 2 minutes away from patient.     Prior Function Level of Independence: Independent with assistive device(s)         Comments: pt stands in shower, has grab bars and a bench but doesn't use it. She is independent with homemaking/ADL's and does not alway use rollator to mobilize.     Hand Dominance   Dominant Hand: Right  Extremity/Trunk Assessment   Upper Extremity Assessment Upper Extremity Assessment: Overall WFL for tasks assessed    Lower Extremity Assessment Lower Extremity Assessment: Generalized weakness    Cervical / Trunk Assessment Cervical / Trunk  Assessment: Normal  Communication   Communication: No difficulties  Cognition Arousal/Alertness: Awake/alert Behavior During Therapy: WFL for tasks assessed/performed Overall Cognitive Status: Within Functional Limits for tasks assessed             General Comments General comments (skin integrity, edema, etc.): Pt desaturated to 89% on RA after ambulating to bathroom, SpO2 incresed to 95% on 2L/min.    Exercises     Assessment/Plan    PT Assessment Patient needs continued PT services  PT Problem List Decreased strength;Decreased activity tolerance;Decreased balance;Decreased mobility;Decreased knowledge of use of DME;Decreased safety awareness       PT Treatment Interventions DME instruction;Gait training;Stair training;Functional mobility training;Therapeutic activities;Therapeutic exercise;Balance training;Patient/family education    PT Goals (Current goals can be found in the Care Plan section)  Acute Rehab PT Goals Patient Stated Goal: to get home PT Goal Formulation: With patient Time For Goal Achievement: 10/29/19 Potential to Achieve Goals: Good    Frequency Min 3X/week    AM-PAC PT "6 Clicks" Mobility  Outcome Measure Help needed turning from your back to your side while in a flat bed without using bedrails?: A Little Help needed moving from lying on your back to sitting on the side of a flat bed without using bedrails?: A Little Help needed moving to and from a bed to a chair (including a wheelchair)?: A Little Help needed standing up from a chair using your arms (e.g., wheelchair or bedside chair)?: A Little Help needed to walk in hospital room?: A Little Help needed climbing 3-5 steps with a railing? : A Lot 6 Click Score: 17    End of Session Equipment Utilized During Treatment: Gait belt Activity Tolerance: Patient tolerated treatment well     PT Visit Diagnosis: Unsteadiness on feet (R26.81);Muscle weakness (generalized) (M62.81);History of falling  (Z91.81)    Time: 1287-8676 PT Time Calculation (min) (ACUTE ONLY): 35 min   Charges:   PT Evaluation $PT Eval Moderate Complexity: 1 Mod PT Treatments $Gait Training: 8-22 mins       Verner Mould, DPT Acute Rehabilitation Services  Office [phone] Pager [phone]  10/15/2019 3:25 PM

## 2019-10-16 DIAGNOSIS — I5042 Chronic combined systolic (congestive) and diastolic (congestive) heart failure: Secondary | ICD-10-CM

## 2019-10-16 DIAGNOSIS — I482 Chronic atrial fibrillation, unspecified: Secondary | ICD-10-CM

## 2019-10-16 DIAGNOSIS — I10 Essential (primary) hypertension: Secondary | ICD-10-CM

## 2019-10-16 LAB — CBC
HCT: 27.2 % — ABNORMAL LOW (ref 36.0–46.0)
Hemoglobin: 7.6 g/dL — ABNORMAL LOW (ref 12.0–15.0)
MCH: 21.5 pg — ABNORMAL LOW (ref 26.0–34.0)
MCHC: 27.9 g/dL — ABNORMAL LOW (ref 30.0–36.0)
MCV: 77.1 fL — ABNORMAL LOW (ref 80.0–100.0)
Platelets: 203 10*3/uL (ref 150–400)
RBC: 3.53 MIL/uL — ABNORMAL LOW (ref 3.87–5.11)
RDW: 20 % — ABNORMAL HIGH (ref 11.5–15.5)
WBC: 8.3 10*3/uL (ref 4.0–10.5)
nRBC: 0 % (ref 0.0–0.2)

## 2019-10-16 LAB — BASIC METABOLIC PANEL
Anion gap: 9 (ref 5–15)
BUN: 21 mg/dL (ref 8–23)
CO2: 23 mmol/L (ref 22–32)
Calcium: 8.1 mg/dL — ABNORMAL LOW (ref 8.9–10.3)
Chloride: 104 mmol/L (ref 98–111)
Creatinine, Ser: 0.82 mg/dL (ref 0.44–1.00)
GFR calc Af Amer: >60 mL/min (ref >60)
GFR calc non Af Amer: >60 mL/min (ref >60)
Glucose, Bld: 123 mg/dL — ABNORMAL HIGH (ref 70–99)
Potassium: 4.3 mmol/L (ref 3.5–5.1)
Sodium: 136 mmol/L (ref 135–145)

## 2019-10-16 LAB — PROCALCITONIN: Procalcitonin: <0.1 ng/mL

## 2019-10-16 LAB — MAGNESIUM: Magnesium: 1.7 mg/dL (ref 1.7–2.4)

## 2019-10-16 MED ORDER — MAGNESIUM SULFATE 2 GM/50ML IV SOLN
2.0000 g | Freq: Once | INTRAVENOUS | Status: AC
  Administered 2019-10-16: 2 g via INTRAVENOUS
  Filled 2019-10-16: qty 50

## 2019-10-16 MED ORDER — FLUTICASONE PROPIONATE 50 MCG/ACT NA SUSP
2.0000 | Freq: Every day | NASAL | Status: DC
  Administered 2019-10-16: 2 via NASAL
  Filled 2019-10-16: qty 16

## 2019-10-16 MED ORDER — ENSURE ENLIVE PO LIQD
237.0000 mL | Freq: Two times a day (BID) | ORAL | Status: DC
  Administered 2019-10-19: 237 mL via ORAL

## 2019-10-16 NOTE — Progress Notes (Signed)
Physical Therapy Treatment Patient Details Name: Katrina Randolph MRN: [id number] DOB: 1930-09-23 Today's Date: 10/16/2019    History of Present Illness 86 y.o. female now with admit for PNA with a fib RVR, anemia with need for transfusions, HTN, hypothyroidism.    PT Comments    Pt up in recliner. Orthostatic BPs--see flow sheets. Pt denies dizziness, reports feeling of "fullness" in her head that is unchanged with positional/transition movements. Pt was able to amb in hallway despite lower BP, denies dizziness, SpO2 90-93% on RA, possibly dropping at rest (?). Significant drop in Hgb noted today as well (pt reports feeling weaker/tired overall today).  RN aware of orthostasis.  Continue PT in acute setting.   Follow Up Recommendations  Home health PT;Supervision/Assistance - 24 hour     Equipment Recommendations  None recommended by PT (TBA, pt has rollator at home)    Recommendations for Other Services       Precautions / Restrictions Precautions Precautions: Fall Restrictions Weight Bearing Restrictions: No    Mobility  Bed Mobility               General bed mobility comments: in chair on arrival   Transfers Overall transfer level: Needs assistance Equipment used: Rolling walker (2 wheeled) Transfers: Sit to/from [facility] Sit to Stand: Min guard         General transfer comment: cues for safety to use RW with transfer. min guard for power up and rise   Ambulation/Gait Ambulation/Gait assistance: Min assist;Min guard Gait Distance (Feet): 80 Feet Assistive device: Rolling walker (2 wheeled) Gait Pattern/deviations: Step-through pattern;Decreased step length - right;Decreased step length - left;Decreased stride length;Trunk flexed Gait velocity: decr   General Gait Details: cues for safe proximity to RW throughout and assist to steady. denies dizziness    Stairs             Wheelchair Mobility    Modified Rankin (Stroke  Patients Only)       Balance Overall balance assessment: Needs assistance Sitting-balance support: Feet supported Sitting balance-Leahy Scale: Good Sitting balance - Comments: pt performed pericare sitting and donned underwear sitting on commode.   Standing balance support: During functional activity;Bilateral upper extremity supported Standing balance-Leahy Scale: Fair                              Cognition Arousal/Alertness: Awake/alert Behavior During Therapy: WFL for tasks assessed/performed Overall Cognitive Status: Within Functional Limits for tasks assessed                                        Exercises      General Comments General comments (skin integrity, edema, etc.): pt with tele box reading sats in 80s, dinamap reading low 90s       Pertinent Vitals/Pain Pain Assessment: No/denies pain    Home Living                      Prior Function            PT Goals (current goals can now be found in the care plan section) Acute Rehab PT Goals Patient Stated Goal: to get home PT Goal Formulation: With patient Time For Goal Achievement: 10/29/19 Potential to Achieve Goals: Good Progress towards PT goals: Progressing toward goals    Frequency  Min 3X/week      PT Plan Current plan remains appropriate    Co-evaluation              AM-PAC PT "6 Clicks" Mobility   Outcome Measure  Help needed turning from your back to your side while in a flat bed without using bedrails?: A Little Help needed moving from lying on your back to sitting on the side of a flat bed without using bedrails?: A Little Help needed moving to and from a bed to a chair (including a wheelchair)?: A Little Help needed standing up from a chair using your arms (e.g., wheelchair or bedside chair)?: A Little Help needed to walk in hospital room?: A Little Help needed climbing 3-5 steps with a railing? : A Lot 6 Click Score: 17    End of  Session Equipment Utilized During Treatment: Gait belt Activity Tolerance: Patient tolerated treatment well Patient left: with call bell/phone within reach;in chair;with chair alarm set Nurse Communication: Other (comment) (orthostatic BPs) PT Visit Diagnosis: Unsteadiness on feet (R26.81);Muscle weakness (generalized) (M62.81);History of falling (Z91.81)     Time: 1308-6578 PT Time Calculation (min) (ACUTE ONLY): 33 min  Charges:  $Gait Training: 8-22 mins $Therapeutic Activity: 8-22 mins                     Baxter Flattery, PT  Acute Rehab Dept ([place]) [phone] Pager [phone]  10/16/2019    [facility] 10/16/2019, 12:03 PM

## 2019-10-16 NOTE — Progress Notes (Addendum)
PROGRESS NOTE    Katrina Randolph  VWU:[id number] DOB: [birth year] DOA: 10/12/2019 PCP: [name], PA-C    Chief Complaint  Patient presents with  . Dizziness  . Shortness of Breath  . Atrial Fibrillation    Brief Narrative:  History of chronic A. fib for 10 years not on any rate control medication not anticoagulation prior to this hospitalization History of anemia require blood transfusions She also has history of hypothyroidism on thyroid supplement,  Presented to ED due to short of breath with dizziness for the last few weeks  Report chronic intermittent cough due to lisinopril, chest x-ray showed left lower lobe pneumonia Is no fever, no hypoxia  Subjective:  She was put on oxygen last night due to found the setting with exertion and at night, per RN patient O2 dropped to 89% on room air while getting up to bedside commode  Heart rate is much better controlled, remains in afib, She denies chest pain at rest Still having some cough,  no hypoxia, no fever No lower extremity edema State lidocaine patch helped right-sided neck pain, Reports feeling weak, feeling dizzy when standing up hgb down to 7.6, no overt bleeding    Assessment & Plan:   Principal Problem:   Rapid atrial fibrillation (HCC) Active Problems:   Pneumonia   Anemia requiring transfusions   DNR (do not resuscitate)   Acquired thrombophilia ([place])   Afib/RVR - with Acquired thrombophilia secondary to chronic afib, she is  not on anticoagulation due to h/o transfusion dependent anemia and risk of falls -she is not on any rate limiting medication , she denies history of bradycardia, she does reports her blood pressure tends to run low- -High sensitivity troponin 10, BNP 344 -Echocardiogram lvef 40-45%, severe bilateral atrial enlargement -she is off cardizem drip, currently on lopressor, heart rate has improved -Cardiology consulted, management per cardiology  Lobar pneumonia  Left lower lobe  infiltrate on cxr,  + Urine strep pneumo antigen  Continue Rocephin and Zithromax for total of 5 days  Hypoxia: -She was put on oxygen on 9/3-9/4 night due to desat to 89% on room air with exertion,  -Both pneumonia and anemia could contribute to low oxygen -will monitor hgb, provide incentive spirometer, wean oxygen as tolerated  HTN Currently on lopressor , losartan   Orthostatic hypotension -Systolic blood pressure 295 laying, 101 sitting, -99 upon standing -Report has been feeling dizzy standing up for long time -will check a.m. cortisol level   Microcytic anemia, she reports was seen by hematologist Dr Truddie Coco for this at [facility] No sign of bleeding Iron store is low, report unremarkable EGD and colonoscopy in the past  retic count is in appropriately low, likely component of anemia of chronic disease    FOBT +, stool is brown in color, plan to start iron supplement at discharge hgb down to 7.6, [facility] Dr. Michail Sermon is consulted  Hypothyroidism, continue Synthroid supplement   Body mass index is 33.06 kg/m.   C/o left side neck pain, topical analgesic Avoid ativan, she was very confused after ativan   FTT: PT recommended home health, transitional care order placed  DVT prophylaxis: enoxaparin (LOVENOX) injection 40 mg Start: 10/12/19 1800   Code Status: DNR Family Communication:  daughter at bedside on September 2, over the phone on 9/4 Disposition:   Status is: Inpatient  Dispo: The patient is from: Home              Anticipated d/c is  to: Home with home health              Anticipated d/c date is:  TBD, needs gi consult  Consultants:   Cardiology  [facility]  Procedures:   None  Antimicrobials:   Rocephin and Zithromax     Objective: Vitals:   10/15/19 1331 10/15/19 1332 10/15/19 2113 10/16/19 0541  BP: 112/69 112/69 108/60 117/64  Pulse: 93 79 82 78  Resp: 20 20 20 16   Temp: 98.2 F (36.8 C) 98.2 F (36.8 C) 99.3 F  (37.4 C) 98.5 F (36.9 C)  TempSrc:      SpO2: 93% 93% 94% 94%  Weight:      Height:        Intake/Output Summary (Last 24 hours) at 10/16/2019 0752 Last data filed at 10/16/2019 0543 Gross per 24 hour  Intake 920 ml  Output 1325 ml  Net -405 ml   Filed Weights   10/12/19 1410 10/13/19 0414  Weight: 77.1 kg 82 kg    Examination:  General exam: Frail, but calm, NAD Respiratory system: Left lower lobe rhonchi has almost resolved, no wheezing,  Respiratory effort normal. Cardiovascular system: S1 & S2 heard, IRRR. No JVD, no murmur, No pedal edema. Gastrointestinal system: Abdomen is nondistended, soft and nontender. No organomegaly or masses felt. Normal bowel sounds heard. Central nervous system: Alert and oriented. No focal neurological deficits. Extremities: Symmetric 5 x 5 power. Skin: No rashes, lesions or ulcers Psychiatry: Judgement and insight appear normal. Mood & affect appropriate.     Data Reviewed: I have personally reviewed following labs and imaging studies  CBC: Recent Labs  Lab 10/12/19 1427 10/13/19 0244 10/14/19 0300 10/15/19 0541 10/16/19 0320  WBC 7.7 7.1 8.1 8.2 8.3  NEUTROABS 6.0  --  6.7 5.9  --   HGB 9.5* 8.2* 8.3* 8.9* 7.6*  HCT 33.6* 30.4* 30.0* 30.4* 27.2*  MCV 76.5* 79.0* 78.1* 76.0* 77.1*  PLT 239 193 192 217 458    Basic Metabolic Panel: Recent Labs  Lab 10/12/19 1427 10/12/19 1620 10/13/19 0244 10/14/19 0300 10/15/19 0541 10/15/19 1113 10/16/19 0320  NA   < >  --  141 142 139 137 136  K   < >  --  4.6 4.8 5.3* 4.8 4.3  CL   < >  --  111 113* 109 108 104  CO2   < >  --  20* 21* 20* 20* 23  GLUCOSE   < >  --  102* 104* 102* 157* 123*  BUN   < >  --  17 15 17 17 21   CREATININE   < >  --  0.84 0.71 0.84 0.85 0.82  CALCIUM   < >  --  8.3* 8.2* 8.3* 8.3* 8.1*  MG  --  2.2  --  2.1  --   --  1.7   < > = values in this interval not displayed.    GFR: Estimated Creatinine Clearance: 48 mL/min (by C-G formula based on SCr of  0.82 mg/dL).  Liver Function Tests: Recent Labs  Lab 10/12/19 1427 10/13/19 0244  AST 21 15  ALT 14 13  ALKPHOS 63 53  BILITOT 1.2 1.2  PROT 7.5 6.4*  ALBUMIN 3.9 3.2*    CBG: No results for input(s): GLUCAP in the last 168 hours.   Recent Results (from the past 240 hour(s))  Culture, blood (Routine X 2) w Reflex to ID Panel     Status: None (Preliminary result)  Collection Time: 10/12/19  4:04 PM   Specimen: BLOOD  Result Value Ref Range Status   Specimen Description   Final    BLOOD LEFT ARM Performed at [facility] [address] [place], [place] [address]    Special Requests   Final    BOTTLES DRAWN AEROBIC AND ANAEROBIC Blood Culture adequate volume Performed at [facility] [address], [place], [place] [address]    Culture   Final    NO GROWTH 3 DAYS Performed at [facility], [place] [address], [place], [place] [address]    Report Status PENDING  Incomplete  SARS Coronavirus 2 by RT PCR (hospital order, performed in [facility] hospital lab) Nasopharyngeal Nasopharyngeal Swab     Status: None   Collection Time: 10/12/19  4:12 PM   Specimen: Nasopharyngeal Swab  Result Value Ref Range Status   SARS Coronavirus 2 NEGATIVE NEGATIVE Final    Comment: (NOTE) SARS-CoV-2 target nucleic acids are NOT DETECTED.  The SARS-CoV-2 RNA is generally detectable in upper and lower respiratory specimens during the acute phase of infection. The lowest concentration of SARS-CoV-2 viral copies this assay can detect is 250 copies / mL. A negative result does not preclude SARS-CoV-2 infection and should not be used as the sole basis for treatment or other patient management decisions.  A negative result may occur with improper specimen collection / handling, submission of specimen other than nasopharyngeal swab, presence of viral mutation(s) within the areas targeted by this assay, and inadequate number of viral copies (<250  copies / mL). A negative result must be combined with clinical observations, patient history, and epidemiological information.  Fact Sheet for Patients:   StrictlyIdeas.no  Fact Sheet for Healthcare Providers: BankingDealers.co.za  This test is not yet approved or  cleared by the [place] FDA and has been authorized for detection and/or diagnosis of SARS-CoV-2 by FDA under an Emergency Use Authorization (EUA).  This EUA will remain in effect (meaning this test can be used) for the duration of the COVID-19 declaration under Section 564(b)(1) of the Act, 21 U.S.C. section 360bbb-3(b)(1), unless the authorization is terminated or revoked sooner.  Performed at [facility], [place] [address], [place], [place] [address]   Culture, blood (Routine X 2) w Reflex to ID Panel     Status: None (Preliminary result)   Collection Time: 10/12/19  4:19 PM   Specimen: BLOOD RIGHT FOREARM  Result Value Ref Range Status   Specimen Description   Final    BLOOD RIGHT FOREARM Performed at [facility] [address] [place], [place] [address]    Special Requests   Final    BOTTLES DRAWN AEROBIC AND ANAEROBIC Blood Culture adequate volume Performed at [facility] [address] [place], [place] [address]    Culture   Final    NO GROWTH 3 DAYS Performed at [facility], [place] [address] [place],  [address]    Report Status PENDING  Incomplete  MRSA PCR Screening     Status: Abnormal   Collection Time: 10/12/19  6:41 PM   Specimen: Nasopharyngeal  Result Value Ref Range Status   MRSA by PCR POSITIVE (A) NEGATIVE Final    Comment:        The GeneXpert MRSA Assay (FDA approved for NASAL specimens only), is one component of a comprehensive MRSA colonization surveillance program. It is not intended to diagnose MRSA infection nor to guide  or monitor treatment for MRSA  infections. RESULT CALLED TO, READ BACK BY AND VERIFIED WITH: MCILQUHAM,D. RN @2103  ON 08.31.2021 BY COHEN,K Performed at [facility], [place] [address], [place], [place] [address]          Radiology Studies: No results found.      Scheduled Meds: . azithromycin  500 mg Oral q1800  . Chlorhexidine Gluconate Cloth  6 each Topical Q0600  . enoxaparin (LOVENOX) injection  40 mg Subcutaneous Q24H  . guaiFENesin  600 mg Oral BID  . levothyroxine  100 mcg Oral Q0600  . lidocaine  1 patch Transdermal Q24H  . losartan  25 mg Oral Daily  . mouth rinse  15 mL Mouth Rinse BID  . metoprolol succinate  75 mg Oral Daily  . mupirocin ointment  1 application Nasal BID  . senna-docusate  1 tablet Oral BID  . sertraline  50 mg Oral Daily  . sodium chloride flush  10-40 mL Intracatheter Q12H   Continuous Infusions: . cefTRIAXone (ROCEPHIN)  IV 2 g (10/15/19 2129)  . diltiazem (CARDIZEM) infusion Stopped (10/13/19 1616)  . magnesium sulfate bolus IVPB       LOS: 4 days   Time spent: 35 mins Greater than 50% of this time was spent in counseling, explanation of diagnosis, planning of further management, and coordination of care.  I have personally reviewed and interpreted on  10/16/2019 daily labs, tele strips, imagings as discussed above under date review session and assessment and plans.  I reviewed all nursing notes, pharmacy notes, consultant notes,  vitals, pertinent old records  I have discussed plan of care as described above with RN , patient and daughter on 10/16/2019  Voice Recognition /Dragon dictation system was used to create this note, attempts have been made to correct errors. Please contact the author with questions and/or clarifications.   Florencia Reasons, MD PhD FACP [facility]  Available via Epic secure chat 7am-7pm for nonurgent issues Please page for urgent issues To page the attending provider between 7A-7P or the covering provider during after  hours 7P-7A, please log into the web site [email or web address] and access using universal [place] password for that web site. If you do not have the password, please call the hospital operator.    10/16/2019, 7:52 AM

## 2019-10-16 NOTE — Progress Notes (Signed)
Progress Note  Patient Name: Katrina Randolph Date of Encounter: 10/16/2019  [facility] Cardiologist: [name], MD   Subjective   Reports dyspnea improving, denies any chest pain.  Inpatient Medications    Scheduled Meds: . azithromycin  500 mg Oral q1800  . Chlorhexidine Gluconate Cloth  6 each Topical Q0600  . enoxaparin (LOVENOX) injection  40 mg Subcutaneous Q24H  . guaiFENesin  600 mg Oral BID  . levothyroxine  100 mcg Oral Q0600  . lidocaine  1 patch Transdermal Q24H  . losartan  25 mg Oral Daily  . mouth rinse  15 mL Mouth Rinse BID  . metoprolol succinate  75 mg Oral Daily  . mupirocin ointment  1 application Nasal BID  . senna-docusate  1 tablet Oral BID  . sertraline  50 mg Oral Daily  . sodium chloride flush  10-40 mL Intracatheter Q12H   Continuous Infusions: . cefTRIAXone (ROCEPHIN)  IV 2 g (10/15/19 2129)  . diltiazem (CARDIZEM) infusion Stopped (10/13/19 1616)  . magnesium sulfate bolus IVPB     PRN Meds: acetaminophen **OR** acetaminophen, cyclobenzaprine, furosemide, ondansetron (ZOFRAN) IV, sodium chloride flush, traMADol, traZODone   Vital Signs    Vitals:   10/15/19 1331 10/15/19 1332 10/15/19 2113 10/16/19 0541  BP: 112/69 112/69 108/60 117/64  Pulse: 93 79 82 78  Resp: 20 20 20 16   Temp: 98.2 F (36.8 C) 98.2 F (36.8 C) 99.3 F (37.4 C) 98.5 F (36.9 C)  TempSrc:      SpO2: 93% 93% 94% 94%  Weight:      Height:        Intake/Output Summary (Last 24 hours) at 10/16/2019 0926 Last data filed at 10/16/2019 0543 Gross per 24 hour  Intake 920 ml  Output 1325 ml  Net -405 ml   Last 3 Weights 10/13/2019 10/12/2019  Weight (lbs) 180 lb 12.4 oz 170 lb  Weight (kg) 82 kg 77.111 kg      Telemetry    Atrial fib HR mostly in the 80-100s.  - Personally Reviewed  ECG    No new - Personally Reviewed  Physical Exam   GEN: No acute distress.   Neck: No JVD Cardiac: irreg irreg , no murmurs, rubs, or gallops. No increase of pain  to palpation  Respiratory: few rales in bases to auscultation bilaterally. GI: Soft, nontender, non-distended  MS: No edema; No deformity. Neuro:  Nonfocal  Psych: Normal affect   Labs    High Sensitivity Troponin:   Recent Labs  Lab 10/12/19 1427 10/12/19 1620  TROPONINIHS 10 10      Chemistry Recent Labs  Lab 10/12/19 1427 10/12/19 1427 10/13/19 0244 10/14/19 0300 10/15/19 0541 10/15/19 1113 10/16/19 0320  NA 143   < > 141   < > 139 137 136  K 4.9   < > 4.6   < > 5.3* 4.8 4.3  CL 112*   < > 111   < > 109 108 104  CO2 20*   < > 20*   < > 20* 20* 23  GLUCOSE 105*   < > 102*   < > 102* 157* 123*  BUN 18   < > 17   < > 17 17 21   CREATININE 0.95   < > 0.84   < > 0.84 0.85 0.82  CALCIUM 9.1   < > 8.3*   < > 8.3* 8.3* 8.1*  PROT 7.5  --  6.4*  --   --   --   --  ALBUMIN 3.9  --  3.2*  --   --   --   --   AST 21  --  15  --   --   --   --   ALT 14  --  13  --   --   --   --   ALKPHOS 63  --  53  --   --   --   --   BILITOT 1.2  --  1.2  --   --   --   --   GFRNONAA 54*   < > >60   < > >60 >60 >60  GFRAA >60   < > >60   < > >60 >60 >60  ANIONGAP 11   < > 10   < > 10 9 9    < > = values in this interval not displayed.     Hematology Recent Labs  Lab 10/14/19 0300 10/15/19 0541 10/16/19 0320  WBC 8.1 8.2 8.3  RBC 3.84*  3.72* 4.00 3.53*  HGB 8.3* 8.9* 7.6*  HCT 30.0* 30.4* 27.2*  MCV 78.1* 76.0* 77.1*  MCH 21.6* 22.3* 21.5*  MCHC 27.7* 29.3* 27.9*  RDW 19.9* 19.9* 20.0*  PLT 192 217 203    BNP Recent Labs  Lab 10/12/19 1427  BNP 344.5*     DDimer No results for input(s): DDIMER in the last 168 hours.   Radiology    No results found.  Cardiac Studies   Echo 10/13/19 IMPRESSIONS    1. Left ventricular ejection fraction, by estimation, is 40 to 45%. The  left ventricle has mildly decreased function. The left ventricle  demonstrates global hypokinesis. Left ventricular diastolic function could  not be evaluated.  2. Right ventricular systolic  function is normal. The right ventricular  size is normal. There is moderately elevated pulmonary artery systolic  pressure.  3. Left atrial size was severely dilated.  4. Right atrial size was severely dilated.  5. The mitral valve is normal in structure. Mild mitral valve  regurgitation. No evidence of mitral stenosis.  6. The aortic valve is normal in structure. Aortic valve regurgitation is  not visualized. No aortic stenosis is present.  7. The inferior vena cava is dilated in size with <50% respiratory  variability, suggesting right atrial pressure of 15 mmHg.   FINDINGS  Left Ventricle: Left ventricular ejection fraction, by estimation, is 40  to 45%. The left ventricle has mildly decreased function. The left  ventricle demonstrates global hypokinesis. The left ventricular internal  cavity size was normal in size. There is  no left ventricular hypertrophy. Left ventricular diastolic function  could not be evaluated due to atrial fibrillation. Left ventricular  diastolic function could not be evaluated.   Right Ventricle: The right ventricular size is normal. No increase in  right ventricular wall thickness. Right ventricular systolic function is  normal. There is moderately elevated pulmonary artery systolic pressure.  The tricuspid regurgitant velocity is  3.09 m/s, and with an assumed right atrial pressure of 15 mmHg, the  estimated right ventricular systolic pressure is 22.0 mmHg.   Left Atrium: Left atrial size was severely dilated.   Right Atrium: Right atrial size was severely dilated.   Pericardium: Trivial pericardial effusion is present. The pericardial  effusion is circumferential.   Mitral Valve: The mitral valve is normal in structure. Normal mobility of  the mitral valve leaflets. Mild mitral valve regurgitation, with  centrally-directed jet. No evidence of mitral valve stenosis.  Tricuspid Valve: The tricuspid valve is normal in structure. Tricuspid   valve regurgitation is mild . No evidence of tricuspid stenosis.   Aortic Valve: The aortic valve is normal in structure. Aortic valve  regurgitation is not visualized. No aortic stenosis is present.   Pulmonic Valve: The pulmonic valve was normal in structure. Pulmonic valve  regurgitation is not visualized. No evidence of pulmonic stenosis.   Aorta: The aortic root is normal in size and structure.   Venous: The inferior vena cava is dilated in size with less than 50%  respiratory variability, suggesting right atrial pressure of 15 mmHg.   IAS/Shunts: No atrial level shunt detected by color flow Doppler.   Patient Profile     86 y.o. female now with admit for PNA with a fib RVR, anemia with need for transfusions, HTN, hypothyroidism.    Assessment & Plan    Atrial fibrillation: TTE on 10/13/19 showed LVEF 40-45%, severe biatrial dilation.  Likely chronic AF. -Her previous cardiologist and hematologist has made a joint decision not to place her on anticoagulation therapy due to her chronic anemia requiring iron transfusion. She has undergonemultiple GI work-up in the past without identifiable cause. 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 Received antibiotic.per IM  Chronic anemia: No prior history of blood transfusion. Has received multiple courses of iron infusion therapy in the past. -According to the patient, she has had both upper endoscopy and colonoscopy in the past and was unable to identify a cause for the anemia. -Her previous hematologist and PCP have made a joint decision to stop her anticoagulation due to unidentified source for the anemia. --hgb down to 7.6 today  Hypothyroidism: Elevated TSH. Would check free T4  Hypertension: On lisinopril at home.now on  losartan and BB  Hyperkalemia at 5.3 yesterday, resolved      For questions or updates, please contact [place] Please consult [email or web address] for contact info under      Signed, Donato Heinz, MD  10/16/2019, 9:26 AM

## 2019-10-16 NOTE — Consult Note (Signed)
Referring Provider: Dr. Erlinda Hong Primary Care Physician:  [name], PA-C Primary Gastroenterologist:  Althia Forts  Reason for Consultation:  Anemia; Heme positive stool  HPI: Katrina Randolph is a 86 y.o. female with baseline Hgb reportedly in the 9 range seen for a consult due to heme positive stool and worsening anemia. Hgb 7.6 today down from 9.5 on 10/12/19. She denies abdominal pain, N/V, dysphagia, rectal bleeding, or hematochezia. History of GERD controlled with Nexium in the past. Afib not on blood thinners. EF 40-45%. Reports last EGD/colonoscopy years ago while living in [facility] but no records (moved there 20 years ago from [place] and moved back when her husband died).  Past Medical History:  Diagnosis Date  . Atrial fibrillation [facility])     Past Surgical History:  Procedure Laterality Date  . ABDOMINAL HYSTERECTOMY    . BREAST LUMPECTOMY    . CHOLECYSTECTOMY      Prior to Admission medications   Medication Sig Start Date End Date Taking? Authorizing Provider  furosemide (LASIX) 20 MG tablet Take 20 mg by mouth daily as needed for fluid.  10/03/19  Yes [provider]  ibuprofen (ADVIL) 800 MG tablet Take 800 mg by mouth every 6 (six) hours as needed for moderate pain.   Yes [provider]  levothyroxine (SYNTHROID) 100 MCG tablet Take 100 mcg by mouth every morning. 08/15/19  Yes [provider]  lisinopril (ZESTRIL) 10 MG tablet Take 10 mg by mouth daily. 08/25/19  Yes [provider]  sertraline (ZOLOFT) 50 MG tablet Take 50 mg by mouth daily. 08/16/19  Yes [provider]  traMADol (ULTRAM) 50 MG tablet Take 50 mg by mouth every 6 (six) hours as needed for moderate pain.  09/17/19  Yes [provider]  traZODone (DESYREL) 50 MG tablet Take 50 mg by mouth at bedtime as needed for sleep.  09/06/19  Yes [provider]    Scheduled Meds: . azithromycin  500 mg Oral q1800  . Chlorhexidine Gluconate Cloth  6 each Topical  Q0600  . enoxaparin (LOVENOX) injection  40 mg Subcutaneous Q24H  . fluticasone  2 spray Each Nare Daily  . guaiFENesin  600 mg Oral BID  . levothyroxine  100 mcg Oral Q0600  . lidocaine  1 patch Transdermal Q24H  . losartan  25 mg Oral Daily  . mouth rinse  15 mL Mouth Rinse BID  . metoprolol succinate  75 mg Oral Daily  . mupirocin ointment  1 application Nasal BID  . senna-docusate  1 tablet Oral BID  . sertraline  50 mg Oral Daily  . sodium chloride flush  10-40 mL Intracatheter Q12H   Continuous Infusions: . cefTRIAXone (ROCEPHIN)  IV 2 g (10/15/19 2129)  . diltiazem (CARDIZEM) infusion Stopped (10/13/19 1616)   PRN Meds:.acetaminophen **OR** acetaminophen, cyclobenzaprine, furosemide, ondansetron (ZOFRAN) IV, sodium chloride flush, traMADol, traZODone  Allergies as of 10/12/2019  . (No Known Allergies)    History reviewed. No pertinent family history.  Social History   Socioeconomic History  . Marital status: Married    Spouse name: Not on file  . Number of children: Not on file  . Years of education: Not on file  . Highest education level: Not on file  Occupational History  . Not on file  Tobacco Use  . Smoking status: Former Research scientist (life sciences)  . Smokeless tobacco: Never Used  Vaping Use  . Vaping Use: Never used  Substance and Sexual Activity  . Alcohol use: Yes  . Drug  use: Never  . Sexual activity: Not on file  Other Topics Concern  . Not on file  Social History Narrative  . Not on file   Social Determinants of Health   Financial Resource Strain:   . Difficulty of Paying Living Expenses: Not on file  Food Insecurity:   . Worried About Charity fundraiser in the Last Year: Not on file  . Ran Out of Food in the Last Year: Not on file  Transportation Needs:   . Lack of Transportation (Medical): Not on file  . Lack of Transportation (Non-Medical): Not on file  Physical Activity:   . Days of Exercise per Week: Not on file  . Minutes of Exercise per Session: Not  on file  Stress:   . Feeling of Stress : Not on file  Social Connections:   . Frequency of Communication with Friends and Family: Not on file  . Frequency of Social Gatherings with Friends and Family: Not on file  . Attends Religious Services: Not on file  . Active Member of Clubs or Organizations: Not on file  . Attends Archivist Meetings: Not on file  . Marital Status: Not on file  Intimate Partner Violence:   . Fear of Current or Ex-Partner: Not on file  . Emotionally Abused: Not on file  . Physically Abused: Not on file  . Sexually Abused: Not on file    Review of Systems: All negative except as stated above in HPI.  Physical Exam: Vital signs: Vitals:   10/16/19 0541 10/16/19 1100  BP: 117/64   Pulse: 78   Resp: 16   Temp: 98.5 F (36.9 C)   SpO2: 94% 93%   Last BM Date: 10/14/19 General:  Elderly, lethargic, well nourished, no acute distress  Head: normocephalic, atraumatic Eyes: anicteric sclera ENT: oropharynx clear Neck: supple, nontender Lungs:  Clear throughout to auscultation.   No wheezes, crackles, or rhonchi. No acute distress. Heart:  Regular rate and rhythm; no murmurs, clicks, rubs,  or gallops. Abdomen: soft, nontender, nondistended, +BS  Rectal:  Deferred Ext: no edema  GI:  Lab Results: Recent Labs    10/14/19 0300 10/15/19 0541 10/16/19 0320  WBC 8.1 8.2 8.3  HGB 8.3* 8.9* 7.6*  HCT 30.0* 30.4* 27.2*  PLT 192 217 203   BMET Recent Labs    10/15/19 0541 10/15/19 1113 10/16/19 0320  NA 139 137 136  K 5.3* 4.8 4.3  CL 109 108 104  CO2 20* 20* 23  GLUCOSE 102* 157* 123*  BUN 17 17 21   CREATININE 0.84 0.85 0.82  CALCIUM 8.3* 8.3* 8.1*   LFT No results for input(s): PROT, ALBUMIN, AST, ALT, ALKPHOS, BILITOT, BILIDIR, IBILI in the last 72 hours. PT/INR No results for input(s): LABPROT, INR in the last 72 hours.   Studies/Results: No results found.  Impression/Plan: Worsening anemia and heme positive stool. No  overt bleeding. Recommended an updated colonoscopy/EGD to further evaluate her anemia but she is not willing to do the procedure due to her previous experience in [facility] where she was prepped for the procedure but anesthesia would not sedate her and also due to difficulty with the prep. If her Hgb continues to drop, then she said she might re-consider but at this time she is not willing to do it. Will slowly advance diet. D/W Dr. Erlinda Hong. Will follow.    LOS: 4 days   Lear Ng  10/16/2019, 12:58 PM  Questions please call [phone]

## 2019-10-17 LAB — CULTURE, BLOOD (ROUTINE X 2)
Culture: NO GROWTH
Culture: NO GROWTH
Special Requests: ADEQUATE
Special Requests: ADEQUATE

## 2019-10-17 LAB — CBC WITH DIFFERENTIAL/PLATELET
Abs Immature Granulocytes: 0.04 10*3/uL (ref 0.00–0.07)
Basophils Absolute: 0.1 10*3/uL (ref 0.0–0.1)
Basophils Relative: 1 %
Eosinophils Absolute: 0.4 10*3/uL (ref 0.0–0.5)
Eosinophils Relative: 5 %
HCT: 27.3 % — ABNORMAL LOW (ref 36.0–46.0)
Hemoglobin: 7.7 g/dL — ABNORMAL LOW (ref 12.0–15.0)
Immature Granulocytes: 1 %
Lymphocytes Relative: 14 %
Lymphs Abs: 1 10*3/uL (ref 0.7–4.0)
MCH: 22.1 pg — ABNORMAL LOW (ref 26.0–34.0)
MCHC: 28.2 g/dL — ABNORMAL LOW (ref 30.0–36.0)
MCV: 78.4 fL — ABNORMAL LOW (ref 80.0–100.0)
Monocytes Absolute: 0.6 10*3/uL (ref 0.1–1.0)
Monocytes Relative: 8 %
Neutro Abs: 5.6 10*3/uL (ref 1.7–7.7)
Neutrophils Relative %: 71 %
Platelets: 191 10*3/uL (ref 150–400)
RBC: 3.48 MIL/uL — ABNORMAL LOW (ref 3.87–5.11)
RDW: 19.8 % — ABNORMAL HIGH (ref 11.5–15.5)
WBC: 7.6 10*3/uL (ref 4.0–10.5)
nRBC: 0 % (ref 0.0–0.2)

## 2019-10-17 LAB — BASIC METABOLIC PANEL
Anion gap: 8 (ref 5–15)
BUN: 23 mg/dL (ref 8–23)
CO2: 23 mmol/L (ref 22–32)
Calcium: 8.3 mg/dL — ABNORMAL LOW (ref 8.9–10.3)
Chloride: 109 mmol/L (ref 98–111)
Creatinine, Ser: 0.72 mg/dL (ref 0.44–1.00)
GFR calc Af Amer: >60 mL/min (ref >60)
GFR calc non Af Amer: >60 mL/min (ref >60)
Glucose, Bld: 101 mg/dL — ABNORMAL HIGH (ref 70–99)
Potassium: 4.5 mmol/L (ref 3.5–5.1)
Sodium: 140 mmol/L (ref 135–145)

## 2019-10-17 LAB — PROTIME-INR
INR: 1.2 (ref 0.8–1.2)
Prothrombin Time: 15.1 seconds (ref 11.4–15.2)

## 2019-10-17 LAB — CORTISOL: Cortisol, Plasma: 4 ug/dL

## 2019-10-17 MED ORDER — COSYNTROPIN 0.25 MG IJ SOLR
0.2500 mg | Freq: Once | INTRAMUSCULAR | Status: AC
  Administered 2019-10-18: 0.25 mg via INTRAVENOUS
  Filled 2019-10-17: qty 0.25

## 2019-10-17 MED ORDER — LEVOTHYROXINE SODIUM 25 MCG PO TABS
125.0000 ug | ORAL_TABLET | Freq: Every day | ORAL | Status: DC
  Administered 2019-10-18 – 2019-10-20 (×3): 125 ug via ORAL
  Filled 2019-10-17 (×3): qty 1

## 2019-10-17 MED ORDER — SODIUM CHLORIDE 0.9 % IV SOLN: INTRAVENOUS | Status: AC

## 2019-10-17 MED ORDER — PEG 3350-KCL-NA BICARB-NACL 420 G PO SOLR
4000.0000 mL | Freq: Once | ORAL | Status: AC
  Administered 2019-10-17: 4000 mL via ORAL

## 2019-10-17 MED ORDER — SODIUM CHLORIDE 0.9 % IV SOLN: INTRAVENOUS | Status: DC

## 2019-10-17 NOTE — Progress Notes (Signed)
PROGRESS NOTE    Katrina Randolph  KVQ:[id number]  DOB: 1930-02-20  PCP: [name], PA-C Chief compliant: dyspnea 86 y.o. female with h/o chronic A. fib over the past 10 years,not on anticoagulation due to anemia requiring multiple transfusions and no clear source,  hypothyroidism presented  with 2-week history of shortness of breath associated with some chest discomfort made worse with mild to moderate exertion.  Patient moved to [place] recently from [place] and [place]. Patient followed up with her PCP on the afternoon of hospital presentation where she was found to have tachycardia, HR>160 bpm.  Patient was subsequently referred to the ED for further work-up.Patient does report chronic dry cough and attributes to lisinopril use, but no fever/chills ED Course: Afebrile, Tachy with HR in the 120s to 150s.  Potassium noted to be 4.9, sodium 143, retinae 0.95.  WBC noted to be 7.7 with hemoglobin 9.5 and platelet count of 239.  Patient was started on Cardizem drip with rate controlled.  Chest x-ray noted a left lower lobe infiltrate-atelectasis vs pneumonia.  Patient was given 1 dose of Rocephin and azithromycin.  Hospital course: Patient admitted to [facility] for further management. Cardiology consulted. During the hospital course she did report feeling dizzy x 2 weeks . Noted to be orthostatic , AM cortisol low at 4. Hgb has down-trended to 7.7 and nurse reported guaiac positive "dark" stools.She was put on oxygen on 9/3-9/4 night due to desat to 89% on room air with exertion--weaned off 9/5 although still reports exertional dyspnea.HR controlled now, Cardiology signed off. [facility] Dr. Michail Sermon is consulted, plan to do EGD/colonoscopy on 9/7.Lisinopril changed to Losartan.  Subjective:  Patient sitting comfortably in bedside chair.  Almost done with GoLYTELY prep, on clear liquid diet.  Underwent cosyntropin stimulation test  today.  Objective: Vitals:   10/17/19 1308 10/17/19 2105 10/18/19 0641 10/18/19 1026  BP: 117/62 120/70 126/70 111/80  Pulse: 75 (!) 108 94 88  Resp: 17 17 20    Temp: 97.7 F (36.5 C) 97.6 F (36.4 C) 98.5 F (36.9 C)   TempSrc: Oral  Oral   SpO2: 98% 100% 97%   Weight:      Height:        Intake/Output Summary (Last 24 hours) at 10/18/2019 1520 Last data filed at 10/18/2019 1017 Gross per 24 hour  Intake 918.42 ml  Output --  Net 918.42 ml   Filed Weights   10/12/19 1410 10/13/19 0414  Weight: 77.1 kg 82 kg    Physical Examination:  General: Moderately built, no acute distress noted Head ENT: Atraumatic normocephalic, PERRLA, neck supple Heart: S1-S2 heard, regular rate and rhythm, no murmurs.  No leg edema noted Lungs: Equal air entry bilaterally, no rhonchi but noted to have some left basal rales, no accessory muscle use Abdomen: Bowel sounds heard, soft, nontender, nondistended. No organomegaly.  No CVA tenderness Extremities: No pedal edema.  No cyanosis or clubbing. Neurological: Awake alert oriented x3, no focal weakness or numbness, strength and sensations to crude touch intact Skin: No wounds or rashes.    Data Reviewed: I have personally reviewed following labs and imaging studies  CBC: Recent Labs  Lab 10/12/19 1427 10/13/19 0244 10/14/19 0300 10/15/19 0541 10/16/19 0320 10/17/19 0325 10/18/19 0447  WBC 7.7   < > 8.1 8.2 8.3 7.6 7.5  NEUTROABS 6.0  --  6.7 5.9  --  5.6  --   HGB 9.5*   < > 8.3* 8.9*  7.6* 7.7* 8.0*  HCT 33.6*   < > 30.0* 30.4* 27.2* 27.3* 28.3*  MCV 76.5*   < > 78.1* 76.0* 77.1* 78.4* 77.1*  PLT 239   < > 192 217 203 191 202   < > = values in this interval not displayed.   Basic Metabolic Panel: Recent Labs  Lab 10/12/19 1620 10/13/19 0244 10/14/19 0300 10/14/19 0300 10/15/19 0541 10/15/19 1113 10/16/19 0320 10/17/19 0325 10/18/19 0447  NA  --    < > 142   < > 139 137 136 140 141  K  --    < > 4.8   < > 5.3* 4.8 4.3  4.5 4.4  CL  --    < > 113*   < > 109 108 104 109 106  CO2  --    < > 21*   < > 20* 20* 23 23 22   GLUCOSE  --    < > 104*   < > 102* 157* 123* 101* 102*  BUN  --    < > 15   < > 17 17 21 23 16   CREATININE  --    < > 0.71   < > 0.84 0.85 0.82 0.72 0.79  CALCIUM  --    < > 8.2*   < > 8.3* 8.3* 8.1* 8.3* 8.5*  MG 2.2  --  2.1  --   --   --  1.7  --   --    < > = values in this interval not displayed.   GFR: Estimated Creatinine Clearance: 49.2 mL/min (by C-G formula based on SCr of 0.79 mg/dL). Liver Function Tests: Recent Labs  Lab 10/12/19 1427 10/13/19 0244  AST 21 15  ALT 14 13  ALKPHOS 63 53  BILITOT 1.2 1.2  PROT 7.5 6.4*  ALBUMIN 3.9 3.2*   No results for input(s): LIPASE, AMYLASE in the last 168 hours. No results for input(s): AMMONIA in the last 168 hours. Coagulation Profile: Recent Labs  Lab 10/17/19 0325  INR 1.2   Cardiac Enzymes: No results for input(s): CKTOTAL, CKMB, CKMBINDEX, TROPONINI in the last 168 hours. BNP (last 3 results) No results for input(s): PROBNP in the last 8760 hours. HbA1C: No results for input(s): HGBA1C in the last 72 hours. CBG: No results for input(s): GLUCAP in the last 168 hours. Lipid Profile: No results for input(s): CHOL, HDL, LDLCALC, TRIG, CHOLHDL, LDLDIRECT in the last 72 hours. Thyroid Function Tests: Recent Labs    10/18/19 0445 10/18/19 0447  TSH  --  6.806*  FREET4 1.08  --    Anemia Panel: No results for input(s): VITAMINB12, FOLATE, FERRITIN, TIBC, IRON, RETICCTPCT in the last 72 hours. Sepsis Labs: Recent Labs  Lab 10/12/19 1604 10/15/19 0541 10/16/19 0320  PROCALCITON  --  <0.10 <0.10  LATICACIDVEN 1.4  --   --     Recent Results (from the past 240 hour(s))  Culture, blood (Routine X 2) w Reflex to ID Panel     Status: None   Collection Time: 10/12/19  4:04 PM   Specimen: BLOOD  Result Value Ref Range Status   Specimen Description   Final    BLOOD LEFT ARM Performed at [place] [address], [place], [place] [address]    Special Requests   Final    BOTTLES DRAWN AEROBIC AND ANAEROBIC Blood Culture adequate volume Performed at [place] [address] [place], [place] [address]    Culture  Final    NO GROWTH 5 DAYS Performed at [facility], [place] [address], [place], [place] [address]    Report Status 10/17/2019 FINAL  Final  SARS Coronavirus 2 by RT PCR (hospital order, performed in [facility] hospital lab) Nasopharyngeal Nasopharyngeal Swab     Status: None   Collection Time: 10/12/19  4:12 PM   Specimen: Nasopharyngeal Swab  Result Value Ref Range Status   SARS Coronavirus 2 NEGATIVE NEGATIVE Final    Comment: (NOTE) SARS-CoV-2 target nucleic acids are NOT DETECTED.  The SARS-CoV-2 RNA is generally detectable in upper and lower respiratory specimens during the acute phase of infection. The lowest concentration of SARS-CoV-2 viral copies this assay can detect is 250 copies / mL. A negative result does not preclude SARS-CoV-2 infection and should not be used as the sole basis for treatment or other patient management decisions.  A negative result may occur with improper specimen collection / handling, submission of specimen other than nasopharyngeal swab, presence of viral mutation(s) within the areas targeted by this assay, and inadequate number of viral copies (<250 copies / mL). A negative result must be combined with clinical observations, patient history, and epidemiological information.  Fact Sheet for Patients:   StrictlyIdeas.no  Fact Sheet for Healthcare Providers: BankingDealers.co.za  This test is not yet approved or  cleared by the [place] FDA and has been authorized for detection and/or diagnosis of SARS-CoV-2 by FDA under an Emergency Use Authorization (EUA).  This EUA will remain in effect (meaning this test can be used) for the duration of  the COVID-19 declaration under Section 564(b)(1) of the Act, 21 U.S.C. section 360bbb-3(b)(1), unless the authorization is terminated or revoked sooner.  Performed at [facility], [place] [address], [place], [place] [address]   Culture, blood (Routine X 2) w Reflex to ID Panel     Status: None   Collection Time: 10/12/19  4:19 PM   Specimen: BLOOD RIGHT FOREARM  Result Value Ref Range Status   Specimen Description   Final    BLOOD RIGHT FOREARM Performed at [place] [address], [place], [place] [address]    Special Requests   Final    BOTTLES DRAWN AEROBIC AND ANAEROBIC Blood Culture adequate volume Performed at [place] [address] [place], [place] [address]    Culture   Final    NO GROWTH 5 DAYS Performed at [facility], [place] [address], [place], [place] [address]    Report Status 10/17/2019 FINAL  Final  MRSA PCR Screening     Status: Abnormal   Collection Time: 10/12/19  6:41 PM   Specimen: Nasopharyngeal  Result Value Ref Range Status   MRSA by PCR POSITIVE (A) NEGATIVE Final    Comment:        The GeneXpert MRSA Assay (FDA approved for NASAL specimens only), is one component of a comprehensive MRSA colonization surveillance program. It is not intended to diagnose MRSA infection nor to guide or monitor treatment for MRSA infections. RESULT CALLED TO, READ BACK BY AND VERIFIED WITH: MCILQUHAM,D. RN @2103  ON 08.31.2021 BY COHEN,K Performed at [facility], [place] [address] [place], [place] [address]       Radiology Studies: No results found.    Scheduled Meds: . Chlorhexidine Gluconate Cloth  6 each Topical Q0600  . feeding supplement (ENSURE ENLIVE)  237 mL Oral BID BM  . fluticasone  2 spray Each Nare Daily  .  guaiFENesin  600 mg Oral BID  . levothyroxine  125 mcg Oral Q0600  . lidocaine  1 patch Transdermal Q24H  . losartan  25 mg Oral Daily  . mouth rinse   15 mL Mouth Rinse BID  . metoprolol succinate  75 mg Oral Daily  . polyethylene glycol-electrolytes  4,000 mL Oral Once  . senna-docusate  1 tablet Oral BID  . sertraline  50 mg Oral Daily  . sodium chloride flush  10-40 mL Intracatheter Q12H   Continuous Infusions: . sodium chloride 10 mL/hr at 10/17/19 1302  . cefTRIAXone (ROCEPHIN)  IV Stopped (10/17/19 2134)  . diltiazem (CARDIZEM) infusion Stopped (10/13/19 1616)     Assessment/Plan:  1.  Atrial fibrillation with RVR: Patient has history of chronic A. fib but not been on anticoagulation due to anemia requiring multiple transfusions.  She is now off Cardizem drip, transition to Lopressor and heart rate improved.  Seen by cardiology and echocardiogram showed EF 40 to 45%, severe  biatrial enlargement. HS troponin at 10.  2.  Left lower lobe pneumonia: Urine streptococcal antigen positive, will finish antibiotic 5-day course with Rocephin and azithromycin today.  3.  Acute hypoxic respiratory failure: Patient required 2 L O2 on 9/3-9/4 as noted to have desaturated to 89% on room air with exertion.  Could be related to pneumonia/mild systolic CHF as echocardiogram showed EF 40 to 45%, BNP 344.  Will obtain walking desat studies and give IV Lasix if still hypoxic.  Appears comfortable at rest, sitting in bedside chair and saturating well on room air.  Does not appear dyspneic while talking full sentences.  4.  Recurrent microcytic anemia: Patient reports prior outpatient GI and hematology (Dr. Truddie Coco) work-up.  She underwent EGD/colonoscopy in the past with no source of bleeding identified.  She denies undergoing capsule endoscopy.  She is noted to be guaiac positive with dark brown stools while here.  Seen by GI Dr. Michail Sermon and plan for EGD/colonoscopy on 9/7 for which she is undergoing colon prep today.  Lovenox for DVT prophylaxis held.  5.  Orthostatic hypotension: Patient reported feeling dizzy during the hospital course, mainly on  standing or walking.  She states she was advised support stockings in the past but was not very compliant.  Random cortisol level was low at 4.0 yesterday.  Repeat level this morning adequate at 19 and with appropriate response to stim test.  Patient advised to resume support stockings at least during the day.  Avoid vasodilators.  7.  Essential hypertension: Currently on Lopressor and lisinopril changed to losartan due to complaints of chronic cough.  Avoid venodilator's as explained above.  8.  Hypothyroidism:TSH 7.9 , normal T4 levels but noted increased Synthroid dosage, should have repeat TSH in 3 to 4 weeks  9.  Obesity: Body mass index is 33.06 kg/m.  Diet and lifestyle modifications  10.  Deconditioning: Seen by PT who recommended home health upon discharge.  Time spent: 35 min    >50% time spent in discussions with care team and coordination of care.   DVT prophylaxis: Lovenox held, SCD/TED hose Code Status: DNR Family / Patient Communication: Discussed with patient in detail Disposition Plan:   Status is: Inpatient  Remains inpatient appropriate because:Ongoing diagnostic testing needed not appropriate for outpatient work up   Dispo: The patient is from: Home              Anticipated d/c is to: Home  Anticipated d/c date is: 1 day              Patient currently is not medically stable to d/c.  [place] Shi, MD [facility] Pager in [place]  If 7PM-7AM, please contact night-coverage [email or web address] 10/18/2019, 3:20 PM Admit date:10/12/2019

## 2019-10-17 NOTE — Progress Notes (Addendum)
PROGRESS NOTE    Katrina Randolph  OHY:[id number] DOB: 02-26-1930 DOA: 10/12/2019 PCP: [name], PA-C    Chief Complaint  Patient presents with  . Dizziness  . Shortness of Breath  . Atrial Fibrillation    Brief Narrative:  History of chronic A. fib for 10 years not on any rate control medication not anticoagulation prior to this hospitalization History of anemia require blood transfusions She also has history of hypothyroidism on thyroid supplement,  Presented to ED due to short of breath with dizziness for the last few weeks  Report chronic intermittent cough due to lisinopril, chest x-ray showed left lower lobe pneumonia Is no fever, no hypoxia  Subjective:  Heart rate is much better controlled, She denies chest pain at rest I did not hear any cough for the last few days, though she reports she still cough occasionally,  no hypoxia, no fever No lower extremity edema State lidocaine patch helped right-sided neck pain, Report has been feeling dizzy standing up for the last 2 weeks hgb down to 7.7, no overt bleeding    Assessment & Plan:   Principal Problem:   Rapid atrial fibrillation (HCC) Active Problems:   Pneumonia   Anemia requiring transfusions   DNR (do not resuscitate)   Acquired thrombophilia ([place])   Afib/RVR - with Acquired thrombophilia secondary to chronic afib, she is  not on anticoagulation due to h/o transfusion dependent anemia and risk of falls -she is not on any rate limiting medication , she denies history of bradycardia, she does reports her blood pressure tends to run low- -High sensitivity troponin 10, BNP 344 -Echocardiogram lvef 40-45%, severe bilateral atrial enlargement -she is off cardizem drip, currently on lopressor, heart rate has improved -Cardiology input appreciated, cardiology signed off today on September 5  Lobar pneumonia  Left lower lobe infiltrate on cxr,  + Urine strep pneumo antigen  Continue Rocephin and  Zithromax for total of 5 days, last dose on September 6.  Hypoxia: -She was put on oxygen on 9/3-9/4 night due to desat to 89% on room air with exertion,  -Both pneumonia and anemia could contribute to low oxygen - monitor hgb, provide incentive spirometer, wean oxygen as tolerated -She is weaned off oxygen today, still report dyspnea on exertion, consider transfer  blood if hemoglobin less than 7 -may need  home O2 eval before discharge.  HTN Currently on lopressor , losartan   Orthostatic hypotension -Systolic blood pressure 948 laying, 101 sitting, -99 upon standing -Report has been feeling dizzy standing up for long time - a.m. cortisol 4 -will do cosyntropin stim test in am   Microcytic anemia, she reports was seen by hematologist Dr Truddie Coco for this at [facility] No sign of bleeding [employer] store is low, report unremarkable EGD and colonoscopy in the past  retic count is in appropriately low, likely component of anemia of chronic disease   FOBT +, stool is brown in color, plan to start iron supplement at discharge hgb down to 7.6, [facility] Dr. Michail Sermon is consulted, plan to do EGD/colonoscopy on 9/7  9/5 7pm addendum: Rn reports patient start to have dark stool, d/c lovenox, start scd's, monitor hgb.  Hypothyroidism, TSH 7.9 , increase Synthroid, repeat TSH in 3 to 4 weeks   Body mass index is 33.06 kg/m.   C/o right side neck pain, topical analgesic, report lidocaine patch helped Avoid ativan, she was very confused after ativan   FTT: PT recommended home health, transitional care  order placed  DVT prophylaxis: enoxaparin (LOVENOX) injection 40 mg Start: 10/12/19 1800   Code Status: DNR Family Communication:  daughter at bedside on September 2, over the phone on 9/4, she declined my offer to call her daughter today on September 5  Disposition:   Status is: Inpatient  Dispo: The patient is from: Home              Anticipated d/c is to: Home with home  health              Anticipated d/c date is:  9/7 or 9/8 pending  gi recommendation   Consultants:   Cardiology  [facility]  Procedures:   EGD colonoscopy planned on September 7  Antimicrobials:   Rocephin and Zithromax     Objective: Vitals:   10/16/19 1415 10/16/19 2050 10/17/19 0632 10/17/19 1308  BP: (!) 119/45 122/66 118/66 117/62  Pulse: 82 86 93 75  Resp: 16 16 16 17   Temp: 97.8 F (36.6 C) 97.6 F (36.4 C) 97.7 F (36.5 C) 97.7 F (36.5 C)  TempSrc: Oral Oral Oral Oral  SpO2: 94% 95% 91% 98%  Weight:      Height:        Intake/Output Summary (Last 24 hours) at 10/17/2019 1614 Last data filed at 10/17/2019 1346 Gross per 24 hour  Intake 600 ml  Output 1951 ml  Net -1351 ml   Filed Weights   10/12/19 1410 10/13/19 0414  Weight: 77.1 kg 82 kg    Examination:  General exam: Frail, but calm, NAD Respiratory system: Left lower lobe rhonchi has almost resolved, no wheezing,  Respiratory effort normal. Cardiovascular system: S1 & S2 heard, IRRR. No JVD, no murmur, No pedal edema. Gastrointestinal system: Abdomen is nondistended, soft and nontender. No organomegaly or masses felt. Normal bowel sounds heard. Central nervous system: Alert and oriented. No focal neurological deficits. Extremities: Symmetric 5 x 5 power. Skin: No rashes, lesions or ulcers Psychiatry: Judgement and insight appear normal. Mood & affect appropriate.     Data Reviewed: I have personally reviewed following labs and imaging studies  CBC: Recent Labs  Lab 10/12/19 1427 10/12/19 1427 10/13/19 0244 10/14/19 0300 10/15/19 0541 10/16/19 0320 10/17/19 0325  WBC 7.7   < > 7.1 8.1 8.2 8.3 7.6  NEUTROABS 6.0  --   --  6.7 5.9  --  5.6  HGB 9.5*   < > 8.2* 8.3* 8.9* 7.6* 7.7*  HCT 33.6*   < > 30.4* 30.0* 30.4* 27.2* 27.3*  MCV 76.5*   < > 79.0* 78.1* 76.0* 77.1* 78.4*  PLT 239   < > 193 192 217 203 191   < > = values in this interval not displayed.    Basic Metabolic  Panel: Recent Labs  Lab 10/12/19 1620 10/13/19 0244 10/14/19 0300 10/15/19 0541 10/15/19 1113 10/16/19 0320 10/17/19 0325  NA  --    < > 142 139 137 136 140  K  --    < > 4.8 5.3* 4.8 4.3 4.5  CL  --    < > 113* 109 108 104 109  CO2  --    < > 21* 20* 20* 23 23  GLUCOSE  --    < > 104* 102* 157* 123* 101*  BUN  --    < > 15 17 17 21 23   CREATININE  --    < > 0.71 0.84 0.85 0.82 0.72  CALCIUM  --    < > 8.2*  8.3* 8.3* 8.1* 8.3*  MG 2.2  --  2.1  --   --  1.7  --    < > = values in this interval not displayed.    GFR: Estimated Creatinine Clearance: 49.2 mL/min (by C-G formula based on SCr of 0.72 mg/dL).  Liver Function Tests: Recent Labs  Lab 10/12/19 1427 10/13/19 0244  AST 21 15  ALT 14 13  ALKPHOS 63 53  BILITOT 1.2 1.2  PROT 7.5 6.4*  ALBUMIN 3.9 3.2*    CBG: No results for input(s): GLUCAP in the last 168 hours.   Recent Results (from the past 240 hour(s))  Culture, blood (Routine X 2) w Reflex to ID Panel     Status: None   Collection Time: 10/12/19  4:04 PM   Specimen: BLOOD  Result Value Ref Range Status   Specimen Description   Final    BLOOD LEFT ARM Performed at [facility] [address] [place], [place] [address]    Special Requests   Final    BOTTLES DRAWN AEROBIC AND ANAEROBIC Blood Culture adequate volume Performed at [facility] [address], [place], [place] [address]    Culture   Final    NO GROWTH 5 DAYS Performed at [facility], [place] [address] [place], [place] [address]    Report Status 10/17/2019 FINAL  Final  SARS Coronavirus 2 by RT PCR (hospital order, performed in [facility] hospital lab) Nasopharyngeal Nasopharyngeal Swab     Status: None   Collection Time: 10/12/19  4:12 PM   Specimen: Nasopharyngeal Swab  Result Value Ref Range Status   SARS Coronavirus 2 NEGATIVE NEGATIVE Final    Comment: (NOTE) SARS-CoV-2 target nucleic acids are NOT DETECTED.  The SARS-CoV-2 RNA  is generally detectable in upper and lower respiratory specimens during the acute phase of infection. The lowest concentration of SARS-CoV-2 viral copies this assay can detect is 250 copies / mL. A negative result does not preclude SARS-CoV-2 infection and should not be used as the sole basis for treatment or other patient management decisions.  A negative result may occur with improper specimen collection / handling, submission of specimen other than nasopharyngeal swab, presence of viral mutation(s) within the areas targeted by this assay, and inadequate number of viral copies (<250 copies / mL). A negative result must be combined with clinical observations, patient history, and epidemiological information.  Fact Sheet for Patients:   StrictlyIdeas.no  Fact Sheet for Healthcare Providers: BankingDealers.co.za  This test is not yet approved or  cleared by the [place] FDA and has been authorized for detection and/or diagnosis of SARS-CoV-2 by FDA under an Emergency Use Authorization (EUA).  This EUA will remain in effect (meaning this test can be used) for the duration of the COVID-19 declaration under Section 564(b)(1) of the Act, 21 U.S.C. section 360bbb-3(b)(1), unless the authorization is terminated or revoked sooner.  Performed at [facility] [place] [address], [place], [place] [address]   Culture, blood (Routine X 2) w Reflex to ID Panel     Status: None   Collection Time: 10/12/19  4:19 PM   Specimen: BLOOD RIGHT FOREARM  Result Value Ref Range Status   Specimen Description   Final    BLOOD RIGHT FOREARM Performed at [facility] [address] [place], [place] [address]    Special Requests   Final    BOTTLES DRAWN AEROBIC AND ANAEROBIC Blood Culture adequate volume  Performed at [facility], [place] [address], [place], [place] [address]    Culture   Final    NO  GROWTH 5 DAYS Performed at [facility], [place] [address], [place], [place] [address]    Report Status 10/17/2019 FINAL  Final  MRSA PCR Screening     Status: Abnormal   Collection Time: 10/12/19  6:41 PM   Specimen: Nasopharyngeal  Result Value Ref Range Status   MRSA by PCR POSITIVE (A) NEGATIVE Final    Comment:        The GeneXpert MRSA Assay (FDA approved for NASAL specimens only), is one component of a comprehensive MRSA colonization surveillance program. It is not intended to diagnose MRSA infection nor to guide or monitor treatment for MRSA infections. RESULT CALLED TO, READ BACK BY AND VERIFIED WITH: MCILQUHAM,D. RN @2103  ON 08.31.2021 BY COHEN,K Performed at [facility], [place] [address], [place], [place] [address]          Radiology Studies: No results found.      Scheduled Meds: . Chlorhexidine Gluconate Cloth  6 each Topical Q0600  . enoxaparin (LOVENOX) injection  40 mg Subcutaneous Q24H  . feeding supplement (ENSURE ENLIVE)  237 mL Oral BID BM  . fluticasone  2 spray Each Nare Daily  . guaiFENesin  600 mg Oral BID  . [START ON 10/18/2019] levothyroxine  125 mcg Oral Q0600  . lidocaine  1 patch Transdermal Q24H  . losartan  25 mg Oral Daily  . mouth rinse  15 mL Mouth Rinse BID  . metoprolol succinate  75 mg Oral Daily  . polyethylene glycol-electrolytes  4,000 mL Oral Once  . senna-docusate  1 tablet Oral BID  . sertraline  50 mg Oral Daily  . sodium chloride flush  10-40 mL Intracatheter Q12H   Continuous Infusions: . sodium chloride 10 mL/hr at 10/17/19 1302  . sodium chloride 75 mL/hr at 10/17/19 1449  . cefTRIAXone (ROCEPHIN)  IV 2 g (10/16/19 2141)  . diltiazem (CARDIZEM) infusion Stopped (10/13/19 1616)     LOS: 5 days   Time spent: 35 mins Greater than 50% of this time was spent in counseling, explanation of diagnosis, planning of further management, and coordination of care.  I have personally reviewed and  interpreted on  10/17/2019 daily labs, tele strips, imagings as discussed above under date review session and assessment and plans.  I reviewed all nursing notes, pharmacy notes, consultant notes,  vitals, pertinent old records  I have discussed plan of care as described above with RN , patient on 10/17/2019  Voice Recognition /Dragon dictation system was used to create this note, attempts have been made to correct errors. Please contact the author with questions and/or clarifications.   Florencia Reasons, MD PhD FACP [facility]  Available via Epic secure chat 7am-7pm for nonurgent issues Please page for urgent issues To page the attending provider between 7A-7P or the covering provider during after hours 7P-7A, please log into the web site [email or web address] and access using universal [place] password for that web site. If you do not have the password, please call the hospital operator.    10/17/2019, 4:14 PM

## 2019-10-17 NOTE — Progress Notes (Signed)
Progress Note  Patient Name: Katrina Randolph Date of Encounter: 10/17/2019  [facility] Cardiologist: [name], MD   Subjective   Denies any chest pain or dyspnea  Inpatient Medications    Scheduled Meds: . Chlorhexidine Gluconate Cloth  6 each Topical Q0600  . enoxaparin (LOVENOX) injection  40 mg Subcutaneous Q24H  . feeding supplement (ENSURE ENLIVE)  237 mL Oral BID BM  . fluticasone  2 spray Each Nare Daily  . guaiFENesin  600 mg Oral BID  . levothyroxine  100 mcg Oral Q0600  . lidocaine  1 patch Transdermal Q24H  . losartan  25 mg Oral Daily  . mouth rinse  15 mL Mouth Rinse BID  . metoprolol succinate  75 mg Oral Daily  . mupirocin ointment  1 application Nasal BID  . senna-docusate  1 tablet Oral BID  . sertraline  50 mg Oral Daily  . sodium chloride flush  10-40 mL Intracatheter Q12H   Continuous Infusions: . cefTRIAXone (ROCEPHIN)  IV 2 g (10/16/19 2141)  . diltiazem (CARDIZEM) infusion Stopped (10/13/19 1616)   PRN Meds: acetaminophen **OR** acetaminophen, cyclobenzaprine, furosemide, ondansetron (ZOFRAN) IV, sodium chloride flush, traMADol, traZODone   Vital Signs    Vitals:   10/16/19 1100 10/16/19 1415 10/16/19 2050 10/17/19 0632  BP:  (!) 119/45 122/66 118/66  Pulse:  82 86 93  Resp:  16 16 16   Temp:  97.8 F (36.6 C) 97.6 F (36.4 C) 97.7 F (36.5 C)  TempSrc:  Oral Oral Oral  SpO2: 93% 94% 95% 91%  Weight:      Height:        Intake/Output Summary (Last 24 hours) at 10/17/2019 0931 Last data filed at 10/17/2019 0500 Gross per 24 hour  Intake 120 ml  Output 1300 ml  Net -1180 ml   Last 3 Weights 10/13/2019 10/12/2019  Weight (lbs) 180 lb 12.4 oz 170 lb  Weight (kg) 82 kg 77.111 kg      Telemetry    Atrial fib HR mostly in the 80-100s.  - Personally Reviewed  ECG    No new - Personally Reviewed  Physical Exam   GEN: No acute distress.   Neck: No JVD Cardiac: irreg irreg , no murmurs, rubs, or gallops. No increase of  pain to palpation  Respiratory: few rales in bases to auscultation bilaterally. GI: Soft, nontender, non-distended  MS: No edema; No deformity. Neuro:  Nonfocal  Psych: Normal affect   Labs    High Sensitivity Troponin:   Recent Labs  Lab 10/12/19 1427 10/12/19 1620  TROPONINIHS 10 10      Chemistry Recent Labs  Lab 10/12/19 1427 10/12/19 1427 10/13/19 0244 10/14/19 0300 10/15/19 1113 10/16/19 0320 10/17/19 0325  NA 143   < > 141   < > 137 136 140  K 4.9   < > 4.6   < > 4.8 4.3 4.5  CL 112*   < > 111   < > 108 104 109  CO2 20*   < > 20*   < > 20* 23 23  GLUCOSE 105*   < > 102*   < > 157* 123* 101*  BUN 18   < > 17   < > 17 21 23   CREATININE 0.95   < > 0.84   < > 0.85 0.82 0.72  CALCIUM 9.1   < > 8.3*   < > 8.3* 8.1* 8.3*  PROT 7.5  --  6.4*  --   --   --   --  ALBUMIN 3.9  --  3.2*  --   --   --   --   AST 21  --  15  --   --   --   --   ALT 14  --  13  --   --   --   --   ALKPHOS 63  --  53  --   --   --   --   BILITOT 1.2  --  1.2  --   --   --   --   GFRNONAA 54*   < > >60   < > >60 >60 >60  GFRAA >60   < > >60   < > >60 >60 >60  ANIONGAP 11   < > 10   < > 9 9 8    < > = values in this interval not displayed.     Hematology Recent Labs  Lab 10/15/19 0541 10/16/19 0320 10/17/19 0325  WBC 8.2 8.3 7.6  RBC 4.00 3.53* 3.48*  HGB 8.9* 7.6* 7.7*  HCT 30.4* 27.2* 27.3*  MCV 76.0* 77.1* 78.4*  MCH 22.3* 21.5* 22.1*  MCHC 29.3* 27.9* 28.2*  RDW 19.9* 20.0* 19.8*  PLT 217 203 191    BNP Recent Labs  Lab 10/12/19 1427  BNP 344.5*     DDimer No results for input(s): DDIMER in the last 168 hours.   Radiology    No results found.  Cardiac Studies   Echo 10/13/19 IMPRESSIONS    1. Left ventricular ejection fraction, by estimation, is 40 to 45%. The  left ventricle has mildly decreased function. The left ventricle  demonstrates global hypokinesis. Left ventricular diastolic function could  not be evaluated.  2. Right ventricular systolic  function is normal. The right ventricular  size is normal. There is moderately elevated pulmonary artery systolic  pressure.  3. Left atrial size was severely dilated.  4. Right atrial size was severely dilated.  5. The mitral valve is normal in structure. Mild mitral valve  regurgitation. No evidence of mitral stenosis.  6. The aortic valve is normal in structure. Aortic valve regurgitation is  not visualized. No aortic stenosis is present.  7. The inferior vena cava is dilated in size with <50% respiratory  variability, suggesting right atrial pressure of 15 mmHg.   FINDINGS  Left Ventricle: Left ventricular ejection fraction, by estimation, is 40  to 45%. The left ventricle has mildly decreased function. The left  ventricle demonstrates global hypokinesis. The left ventricular internal  cavity size was normal in size. There is  no left ventricular hypertrophy. Left ventricular diastolic function  could not be evaluated due to atrial fibrillation. Left ventricular  diastolic function could not be evaluated.   Right Ventricle: The right ventricular size is normal. No increase in  right ventricular wall thickness. Right ventricular systolic function is  normal. There is moderately elevated pulmonary artery systolic pressure.  The tricuspid regurgitant velocity is  3.09 m/s, and with an assumed right atrial pressure of 15 mmHg, the  estimated right ventricular systolic pressure is 08.6 mmHg.   Left Atrium: Left atrial size was severely dilated.   Right Atrium: Right atrial size was severely dilated.   Pericardium: Trivial pericardial effusion is present. The pericardial  effusion is circumferential.   Mitral Valve: The mitral valve is normal in structure. Normal mobility of  the mitral valve leaflets. Mild mitral valve regurgitation, with  centrally-directed jet. No evidence of mitral valve stenosis.   Tricuspid Valve:  The tricuspid valve is normal in structure. Tricuspid   valve regurgitation is mild . No evidence of tricuspid stenosis.   Aortic Valve: The aortic valve is normal in structure. Aortic valve  regurgitation is not visualized. No aortic stenosis is present.   Pulmonic Valve: The pulmonic valve was normal in structure. Pulmonic valve  regurgitation is not visualized. No evidence of pulmonic stenosis.   Aorta: The aortic root is normal in size and structure.   Venous: The inferior vena cava is dilated in size with less than 50%  respiratory variability, suggesting right atrial pressure of 15 mmHg.   IAS/Shunts: No atrial level shunt detected by color flow Doppler.   Patient Profile     86 y.o. female now with admit for PNA with a fib RVR, anemia with need for transfusions, HTN, hypothyroidism.    Assessment & Plan    Atrial fibrillation: TTE on 10/13/19 showed LVEF 40-45%, severe biatrial dilation.  Likely chronic AF. -Her previous cardiologist and hematologist had made a joint decision not to place her on anticoagulation therapy due to her chronic anemia requiring iron transfusion. She has undergonemultiple GI work-up in the past without identifiable cause. 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  Appears euvolemic -Continue toprol XL 75 mg daily -Continue losartan 25 mg daily  Left lower lobe pneumonia: Seen on chest x-ray. Received antibiotic.per IM  Chronic anemia: No prior history of blood transfusion. Has received multiple courses of iron infusion therapy in the past. -According to the patient, she has had both upper endoscopy and colonoscopy in the past and was unable to identify a cause for the anemia. --hgb down to 7.7 today.  GI consulted, recommended EGD/colonoscopy but patient unsure if wants any procedures  Hypothyroidism: Elevated TSH. Would check free T4  Hypertension: On lisinopril at home.now on losartan and BB   [facility] will sign off.   Medication Recommendations:  toprol XL 75 mg daily, losartan 25 mg daily Other recommendations (labs, testing, etc):  None Follow up as an outpatient:  Will schedule outpatient f/u   For questions or updates, please contact [facility] Please consult [email or web address] for contact info under      Signed, Donato Heinz, MD  10/17/2019, 9:31 AM

## 2019-10-17 NOTE — Progress Notes (Signed)
Patient had a dark stool earlier today, and with the positive heme result in stool, this nurse hesitant to admin lovenox with patient hgb 7.7 and hematology hx. Paged Dr. Erlinda Hong and verbal order to d/c lovenox given, and to start SCDs. Call bell within reach.

## 2019-10-17 NOTE — Progress Notes (Signed)
Patient is tolerating bowel prep well.

## 2019-10-17 NOTE — Progress Notes (Signed)
Educated patient about bowl prep that will start today per Dr. Michail Sermon order. It will be repeated again tomorrow. Patient and pt daughter is aware. Pt is sitting up in chair comfortably at this time. Call bell within reach.

## 2019-10-17 NOTE — Plan of Care (Signed)
  Problem: Education: Goal: Knowledge of General Education information will improve Description: Including pain rating scale, medication(s)/side effects and non-pharmacologic comfort measures Outcome: Progressing   Problem: Health Behavior/Discharge Planning: Goal: Ability to manage health-related needs will improve Outcome: Progressing   Problem: Clinical Measurements: Goal: Ability to maintain clinical measurements within normal limits will improve Outcome: Progressing Goal: Will remain free from infection Outcome: Progressing Goal: Diagnostic test results will improve Outcome: Progressing Goal: Cardiovascular complication will be avoided Outcome: Progressing   Problem: Activity: Goal: Risk for activity intolerance will decrease Outcome: Progressing   Problem: Nutrition: Goal: Adequate nutrition will be maintained Outcome: Progressing   Problem: Pain Managment: Goal: General experience of comfort will improve Outcome: Progressing   Problem: Safety: Goal: Ability to remain free from injury will improve Outcome: Progressing   Problem: Education: Goal: Knowledge of disease or condition will improve Outcome: Progressing Goal: Understanding of medication regimen will improve Outcome: Progressing Goal: Individualized Educational Video(s) Outcome: Progressing   Problem: Activity: Goal: Ability to tolerate increased activity will improve Outcome: Progressing   Problem: Cardiac: Goal: Ability to achieve and maintain adequate cardiopulmonary perfusion will improve Outcome: Progressing   Problem: Health Behavior/Discharge Planning: Goal: Ability to safely manage health-related needs after discharge will improve Outcome: Progressing

## 2019-10-17 NOTE — Progress Notes (Signed)
Patient ID: Katrina Randolph, female   DOB: 1930/10/04, 86 y.o.   MRN: [id number]   Patient sitting on the side of the bed about to go to the bathroom with the nurse. Informed of plan to start the colon prep this afternoon and repeat prep tomorrow to do a 2 day prep prior to doing the EGD/colonoscopy on 10/19/19 at a time to be determined. Clear liquid diet today and tomorrow and NPO after midnight Monday night.

## 2019-10-18 DIAGNOSIS — D649 Anemia, unspecified: Secondary | ICD-10-CM

## 2019-10-18 DIAGNOSIS — J13 Pneumonia due to Streptococcus pneumoniae: Secondary | ICD-10-CM

## 2019-10-18 LAB — CBC
HCT: 28.3 % — ABNORMAL LOW (ref 36.0–46.0)
Hemoglobin: 8 g/dL — ABNORMAL LOW (ref 12.0–15.0)
MCH: 21.8 pg — ABNORMAL LOW (ref 26.0–34.0)
MCHC: 28.3 g/dL — ABNORMAL LOW (ref 30.0–36.0)
MCV: 77.1 fL — ABNORMAL LOW (ref 80.0–100.0)
Platelets: 202 10*3/uL (ref 150–400)
RBC: 3.67 MIL/uL — ABNORMAL LOW (ref 3.87–5.11)
RDW: 19.8 % — ABNORMAL HIGH (ref 11.5–15.5)
WBC: 7.5 10*3/uL (ref 4.0–10.5)
nRBC: 0 % (ref 0.0–0.2)

## 2019-10-18 LAB — BASIC METABOLIC PANEL
Anion gap: 13 (ref 5–15)
BUN: 16 mg/dL (ref 8–23)
CO2: 22 mmol/L (ref 22–32)
Calcium: 8.5 mg/dL — ABNORMAL LOW (ref 8.9–10.3)
Chloride: 106 mmol/L (ref 98–111)
Creatinine, Ser: 0.79 mg/dL (ref 0.44–1.00)
GFR calc Af Amer: >60 mL/min (ref >60)
GFR calc non Af Amer: >60 mL/min (ref >60)
Glucose, Bld: 102 mg/dL — ABNORMAL HIGH (ref 70–99)
Potassium: 4.4 mmol/L (ref 3.5–5.1)
Sodium: 141 mmol/L (ref 135–145)

## 2019-10-18 LAB — T4, FREE: Free T4: 1.08 ng/dL (ref 0.61–1.12)

## 2019-10-18 LAB — ACTH STIMULATION, 3 TIME POINTS
Cortisol, 30 Min: 25.7 ug/dL
Cortisol, 60 Min: 29.4 ug/dL
Cortisol, Base: 19.4 ug/dL

## 2019-10-18 LAB — TSH: TSH: 6.806 u[IU]/mL — ABNORMAL HIGH (ref 0.350–4.500)

## 2019-10-18 MED ORDER — PEG 3350-KCL-NA BICARB-NACL 420 G PO SOLR
4000.0000 mL | Freq: Once | ORAL | Status: AC
  Administered 2019-10-18: 4000 mL via ORAL

## 2019-10-18 NOTE — Progress Notes (Signed)
Physical Therapy Treatment Patient Details Name: Katrina Randolph MRN: [id number] DOB: 16-Oct-1930 Today's Date: 10/18/2019    History of Present Illness 86 y.o. female now with admit for PNA with a fib RVR, anemia with need for transfusions, HTN, hypothyroidism.    PT Comments    Patient making steady progress with PT. Pt agreeable to mobilize in room with therapy but not in hallway as she is using commode frequently for bowel prep. She initiated mobilizing with IV pole for UE support and required min assist intermittently to manage IV position. She was educated on exercise for LE strengthening and benefits of rest breaks to prevent SOB and allow HR to recover. HR reaching high 130's during mobility and would drop back to 120's. After ~1-2 minutes rest pt HR returned to 90's-100's. Acute PT will continue to mobilize and progress as able.   Follow Up Recommendations  Home health PT;Supervision/Assistance - 24 hour     Equipment Recommendations  None recommended by PT (TBA, pt has rollator at home)    Recommendations for Other Services       Precautions / Restrictions Precautions Precautions: Fall Restrictions Weight Bearing Restrictions: No    Mobility  Bed Mobility        General bed mobility comments: pt in recliner on arrival  Transfers Overall transfer level: Needs assistance Equipment used: Rolling walker (2 wheeled) Transfers: Sit to/from Stand Sit to Stand: Min guard         General transfer comment: ceus for safe technique with RW, min guard for safety with power up.   Ambulation/Gait Ambulation/Gait assistance: Min assist;Min guard Gait Distance (Feet): 20 Feet Assistive device: IV Pole Gait Pattern/deviations: Step-through pattern;Decreased step length - right;Decreased step length - left;Decreased stride length;Narrow base of support Gait velocity: decr   General Gait Details: pt denied dizzienss/lightheadedness/SOB. min guard and intermittent assist  for safety with IV pole mangement for gait in room. HR elevated to 120-130's and would drop back to 90's-110's after rest.   Stairs             Wheelchair Mobility    Modified Rankin (Stroke Patients Only)       Balance Overall balance assessment: Needs assistance Sitting-balance support: Feet supported Sitting balance-Leahy Scale: Good     Standing balance support: During functional activity;Bilateral upper extremity supported Standing balance-Leahy Scale: Fair             Cognition Arousal/Alertness: Awake/alert Behavior During Therapy: WFL for tasks assessed/performed Overall Cognitive Status: Within Functional Limits for tasks assessed               Exercises Other Exercises Other Exercises: 2x 5 reps Sit<>Stand (pt using UE on first set for power up, no UE use on second set)    General Comments        Pertinent Vitals/Pain Pain Assessment: No/denies pain           PT Goals (current goals can now be found in the care plan section) Acute Rehab PT Goals PT Goal Formulation: With patient Time For Goal Achievement: 10/29/19 Potential to Achieve Goals: Good Progress towards PT goals: Progressing toward goals    Frequency    Min 3X/week      PT Plan Current plan remains appropriate       AM-PAC PT "6 Clicks" Mobility   Outcome Measure  Help needed turning from your back to your side while in a flat bed without using bedrails?: A Little Help needed moving from lying  on your back to sitting on the side of a flat bed without using bedrails?: A Little Help needed moving to and from a bed to a chair (including a wheelchair)?: A Little Help needed standing up from a chair using your arms (e.g., wheelchair or bedside chair)?: A Little Help needed to walk in hospital room?: A Little Help needed climbing 3-5 steps with a railing? : A Lot 6 Click Score: 17    End of Session Equipment Utilized During Treatment: Gait belt Activity Tolerance:  Patient tolerated treatment well Patient left: in chair;with call bell/phone within reach;with chair alarm set Nurse Communication: Mobility status PT Visit Diagnosis: Unsteadiness on feet (R26.81);Muscle weakness (generalized) (M62.81);History of falling (Z91.81)     Time: 9021-1155 PT Time Calculation (min) (ACUTE ONLY): 21 min  Charges:  $Therapeutic Exercise: 8-22 mins                     Verner Mould, DPT Acute Rehabilitation Services  Office [phone] Pager [phone]  10/18/2019 11:47 AM

## 2019-10-18 NOTE — Progress Notes (Addendum)
Patient ID: Katrina Randolph, female   DOB: 1930/08/27, 86 y.o.   MRN: [id number]  Sitting in bedside chair with daughter in room.  Gen - alert, no acute distress, pleasant, well-nourished HEENT - anicteric sclera  Anemia with heme positive stool: no overt bleeding. EGD/colonoscopy tomorrow morning. Drank most of the gallon of NuLytely colon prep yesterday and today and reports orange loose stools with some stool particles confirmed by her daughter who is in the room. She does not think she could drink another gallon or even half of a gallon of additional prep but I think she needs a little more colon prep today. I recommended 1/4 gallon (assuming her stools are not brown) in addition to finishing the amount she has in her cup and she is willing to try. Colon/EGD planned for tomorrow morning at 930AM with Dr. Alessandra Bevels. D/W additional prep plan with nurse and orders placed.

## 2019-10-19 ENCOUNTER — Inpatient Hospital Stay (HOSPITAL_COMMUNITY): Payer: Medicare Other | Admitting: Anesthesiology

## 2019-10-19 ENCOUNTER — Encounter (HOSPITAL_COMMUNITY): Payer: Self-pay | Admitting: Internal Medicine

## 2019-10-19 ENCOUNTER — Encounter (HOSPITAL_COMMUNITY)
Admission: EM | Disposition: A | Discharge status: Home Health | Payer: Self-pay | Source: Ambulatory Visit | Attending: Internal Medicine

## 2019-10-19 DIAGNOSIS — E039 Hypothyroidism, unspecified: Secondary | ICD-10-CM

## 2019-10-19 DIAGNOSIS — R42 Dizziness and giddiness: Secondary | ICD-10-CM

## 2019-10-19 DIAGNOSIS — K635 Polyp of colon: Secondary | ICD-10-CM

## 2019-10-19 DIAGNOSIS — J9600 Acute respiratory failure, unspecified whether with hypoxia or hypercapnia: Secondary | ICD-10-CM

## 2019-10-19 HISTORY — PX: COLONOSCOPY WITH PROPOFOL: SHX5780

## 2019-10-19 HISTORY — PX: BIOPSY: SHX5522

## 2019-10-19 HISTORY — PX: ESOPHAGOGASTRODUODENOSCOPY (EGD) WITH PROPOFOL: SHX5813

## 2019-10-19 HISTORY — PX: POLYPECTOMY: SHX5525

## 2019-10-19 LAB — BASIC METABOLIC PANEL
Anion gap: 10 (ref 5–15)
BUN: 11 mg/dL (ref 8–23)
CO2: 20 mmol/L — ABNORMAL LOW (ref 22–32)
Calcium: 8.4 mg/dL — ABNORMAL LOW (ref 8.9–10.3)
Chloride: 110 mmol/L (ref 98–111)
Creatinine, Ser: 0.73 mg/dL (ref 0.44–1.00)
GFR calc Af Amer: >60 mL/min (ref >60)
GFR calc non Af Amer: >60 mL/min (ref >60)
Glucose, Bld: 103 mg/dL — ABNORMAL HIGH (ref 70–99)
Potassium: 4.2 mmol/L (ref 3.5–5.1)
Sodium: 140 mmol/L (ref 135–145)

## 2019-10-19 LAB — CBC
HCT: 27.9 % — ABNORMAL LOW (ref 36.0–46.0)
Hemoglobin: 7.8 g/dL — ABNORMAL LOW (ref 12.0–15.0)
MCH: 21.9 pg — ABNORMAL LOW (ref 26.0–34.0)
MCHC: 28 g/dL — ABNORMAL LOW (ref 30.0–36.0)
MCV: 78.4 fL — ABNORMAL LOW (ref 80.0–100.0)
Platelets: 197 10*3/uL (ref 150–400)
RBC: 3.56 MIL/uL — ABNORMAL LOW (ref 3.87–5.11)
RDW: 20.2 % — ABNORMAL HIGH (ref 11.5–15.5)
WBC: 6 10*3/uL (ref 4.0–10.5)
nRBC: 0 % (ref 0.0–0.2)

## 2019-10-19 LAB — ABO/RH: ABO/RH(D): A POS

## 2019-10-19 LAB — PREPARE RBC (CROSSMATCH)

## 2019-10-19 SURGERY — COLONOSCOPY WITH PROPOFOL
Anesthesia: Monitor Anesthesia Care

## 2019-10-19 MED ORDER — PROPOFOL 500 MG/50ML IV EMUL
INTRAVENOUS | Status: DC | PRN
  Administered 2019-10-19: 125 ug/kg/min via INTRAVENOUS

## 2019-10-19 MED ORDER — PANTOPRAZOLE SODIUM 40 MG PO TBEC
40.0000 mg | DELAYED_RELEASE_TABLET | Freq: Every day | ORAL | 2 refills | Status: DC
Start: 2019-10-19 — End: 2020-02-16

## 2019-10-19 MED ORDER — FERROUS SULFATE 325 (65 FE) MG PO TABS
325.0000 mg | ORAL_TABLET | Freq: Two times a day (BID) | ORAL | Status: DC
  Administered 2019-10-19 – 2019-10-20 (×2): 325 mg via ORAL
  Filled 2019-10-19 (×2): qty 1

## 2019-10-19 MED ORDER — SENNOSIDES-DOCUSATE SODIUM 8.6-50 MG PO TABS: 1.0000 | ORAL_TABLET | Freq: Two times a day (BID) | ORAL | 1 refills | Status: DC

## 2019-10-19 MED ORDER — PANTOPRAZOLE SODIUM 40 MG PO TBEC
40.0000 mg | DELAYED_RELEASE_TABLET | Freq: Every day | ORAL | Status: DC
  Administered 2019-10-19 – 2019-10-20 (×2): 40 mg via ORAL
  Filled 2019-10-19 (×3): qty 1

## 2019-10-19 MED ORDER — FERROUS SULFATE 325 (65 FE) MG PO TABS
325.0000 mg | ORAL_TABLET | Freq: Two times a day (BID) | ORAL | 3 refills | Status: DC
Start: 2019-10-19 — End: 2020-02-16

## 2019-10-19 MED ORDER — LIDOCAINE HCL (CARDIAC) PF 100 MG/5ML IV SOSY
PREFILLED_SYRINGE | INTRAVENOUS | Status: DC | PRN
  Administered 2019-10-19: 50 mg via INTRATRACHEAL

## 2019-10-19 MED ORDER — ENSURE ENLIVE PO LIQD: 237.0000 mL | Freq: Two times a day (BID) | ORAL | 12 refills | Status: DC

## 2019-10-19 MED ORDER — METOPROLOL SUCCINATE ER 25 MG PO TB24
75.0000 mg | ORAL_TABLET | Freq: Every day | ORAL | 2 refills | Status: DC
Start: 2019-10-19 — End: 2019-10-20

## 2019-10-19 MED ORDER — LEVOTHYROXINE SODIUM 112 MCG PO TABS: 112.0000 ug | ORAL_TABLET | Freq: Every morning | ORAL | 0 refills | Status: DC

## 2019-10-19 MED ORDER — PROPOFOL 500 MG/50ML IV EMUL
INTRAVENOUS | Status: DC | PRN
  Administered 2019-10-19: 10 mg via INTRAVENOUS
  Administered 2019-10-19 (×2): 20 mg via INTRAVENOUS

## 2019-10-19 MED ORDER — PROPOFOL 500 MG/50ML IV EMUL
INTRAVENOUS | Status: AC
  Filled 2019-10-19: qty 150

## 2019-10-19 MED ORDER — LACTATED RINGERS IV SOLN: INTRAVENOUS | Status: DC | PRN

## 2019-10-19 SURGICAL SUPPLY — 24 items

## 2019-10-19 NOTE — Op Note (Signed)
[facility] Patient Name: Katrina Randolph Procedure Date: 10/19/2019 MRN: [id number] Attending MD: [name] , MD Date of Birth: 1930/12/26 CSN: [id number] Age: 86 Admit Type: Outpatient Procedure:                Colonoscopy Indications:              Heme positive stool, Iron deficiency anemia Providers:                [name], MD, Clyde Lundborg, RN, Fransico Setters                            Mbumina, Technician Referring MD:              Medicines:                Sedation Administered by an Anesthesia Professional Complications:            No immediate complications. Estimated Blood Loss:     Estimated blood loss was minimal. Procedure:                Pre-Anesthesia Assessment:                           - Prior to the procedure, a History and Physical                            was performed, and patient medications and                            allergies were reviewed. The patient's tolerance of                            previous anesthesia was also reviewed. The risks                            and benefits of the procedure and the sedation                            options and risks were discussed with the patient.                            All questions were answered, and informed consent                            was obtained. Prior Anticoagulants: The patient has                            taken no previous anticoagulant or antiplatelet                            agents. ASA Grade Assessment: III - A patient with                            severe systemic disease. After reviewing the risks  and benefits, the patient was deemed in                            satisfactory condition to undergo the procedure.                           - Prior to the procedure, a History and Physical                            was performed, and patient medications and                            allergies were reviewed. The patient's tolerance of                             previous anesthesia was also reviewed. The risks                            and benefits of the procedure and the sedation                            options and risks were discussed with the patient.                            All questions were answered, and informed consent                            was obtained. Prior Anticoagulants: The patient has                            taken no previous anticoagulant or antiplatelet                            agents. ASA Grade Assessment: III - A patient with                            severe systemic disease. After reviewing the risks                            and benefits, the patient was deemed in                            satisfactory condition to undergo the procedure.                           After obtaining informed consent, the colonoscope                            was passed under direct vision. Throughout the                            procedure, the patient's blood pressure, pulse, and  oxygen saturations were monitored continuously. The                            PCF-H190DL ([id number]) Olympus pediatric colonscope                            was introduced through the anus and advanced to the                            the terminal ileum, with identification of the                            appendiceal orifice and IC valve. The colonoscopy                            was performed without difficulty. The patient                            tolerated the procedure well. The quality of the                            bowel preparation was fair. Scope In: 8:53:50 AM Scope Out: 9:16:13 AM Scope Withdrawal Time: 0 hours 15 minutes 4 seconds  Total Procedure Duration: 0 hours 22 minutes 23 seconds  Findings:      The perianal and digital rectal examinations were normal.      The terminal ileum appeared normal.      A diminutive polyp was found in the cecum. The polyp was removed with a        cold biopsy forceps. Resection and retrieval were complete.      A 3 mm polyp was found in the ascending colon. The polyp was removed       with a cold biopsy forceps. Resection and retrieval were complete.      A 8 mm polyp was found in the sigmoid colon. The polyp was       semi-pedunculated. The polyp was removed with a hot snare. Resection and       retrieval were complete.      Multiple small and large-mouthed diverticula were found in the sigmoid       colon and descending colon.      Internal hemorrhoids were found during retroflexion. The hemorrhoids       were medium-sized. Impression:               - Preparation of the colon was fair.                           - The examined portion of the ileum was normal.                           - One diminutive polyp in the cecum, removed with a                            cold biopsy forceps. Resected and retrieved.                           -  One 3 mm polyp in the ascending colon, removed                            with a cold biopsy forceps. Resected and retrieved.                           - One 8 mm polyp in the sigmoid colon, removed with                            a hot snare. Resected and retrieved.                           - Diverticulosis in the sigmoid colon and in the                            descending colon.                           - Internal hemorrhoids. Moderate Sedation:      Moderate (conscious) sedation was personally administered by an       anesthesia professional. The following parameters were monitored: oxygen       saturation, heart rate, blood pressure, and response to care. Recommendation:           - Return patient to hospital ward for ongoing care.                           - Soft diet.                           - Continue present medications.                           - Await pathology results. Procedure Code(s):        --- Professional ---                           [phone], Colonoscopy, flexible;  with removal of                            tumor(s), polyp(s), or other lesion(s) by snare                            technique                           [id number], 9, Colonoscopy, flexible; with biopsy,                            single or multiple Diagnosis Code(s):        --- Professional ---                           K64.8, Other hemorrhoids                           K63.5, Polyp of colon  R19.5, Other fecal abnormalities                           D50.9, Iron deficiency anemia, unspecified                           K57.30, Diverticulosis of large intestine without                            perforation or abscess without bleeding CPT copyright 2019 [employer]. All rights reserved. The codes documented in this report are preliminary and upon coder review may  be revised to meet current compliance requirements. [name], MD [name], MD 10/19/2019 9:31:58 AM Number of Addenda: 0

## 2019-10-19 NOTE — TOC Progression Note (Signed)
Transition of Care [facility]) - Progression Note    Patient Details  Name: Katrina Randolph MRN: [id number] Date of Birth: 1930-11-05  [facility]) CM/SW Contact  Rosaura Bolon, Juliann Pulse, RN Phone Number: 10/19/2019, 11:21 AM  Clinical Narrative: Per nsg patient sleeping. TC left message w/dtr Santiago Glad [phone] about d/c plans await call back. Ordered for HHPT-await agency to accept. Per nsg while in endo on 02. Will monitor if needed for home-would need respiratory dx, 02 sats, & home 02 order.      Expected Discharge Plan: Home/Self Care Barriers to Discharge: Continued Medical Work up  Expected Discharge Plan and Services Expected Discharge Plan: Home/Self Care   Discharge Planning Services: CM Consult   Living arrangements for the past 2 months: Single Family Home Expected Discharge Date: 10/19/19                                     Social Determinants of Health (SDOH) Interventions    Readmission Risk Interventions No flowsheet data found.

## 2019-10-19 NOTE — Progress Notes (Signed)
Pt tolerated blood transfusion well. No adverse reactions and effects from blood. Pt stayed aware the whole time of administration. Midline in tact. HR  Remains waverable in A- fib.

## 2019-10-19 NOTE — Telephone Encounter (Signed)
Patient is present in the hospital- had procedure today.  Will wait until patient is discharged.

## 2019-10-19 NOTE — Progress Notes (Signed)
PROGRESS NOTE    Katrina Randolph  XVQ:[id number]  DOB: 12-18-1930  PCP: [name], PA-C Chief compliant: dyspnea 86 y.o. female with h/o chronic A. fib over the past 10 years,not on anticoagulation due to anemia requiring multiple transfusions and no clear source,  hypothyroidism presented  with 2-week history of shortness of breath associated with some chest discomfort made worse with mild to moderate exertion.  Patient moved to [place] recently from [place] and [place]. Patient followed up with her PCP on the afternoon of hospital presentation where she was found to have tachycardia, HR>160 bpm.  Patient was subsequently referred to the ED for further work-up.Patient does report chronic dry cough and attributes to lisinopril use, but no fever/chills ED Course: Afebrile, Tachy with HR in the 120s to 150s.  Potassium noted to be 4.9, sodium 143, retinae 0.95.  WBC noted to be 7.7 with hemoglobin 9.5 and platelet count of 239.  Patient was started on Cardizem drip with rate controlled.  Chest x-ray noted a left lower lobe infiltrate-atelectasis vs pneumonia.  Patient was given 1 dose of Rocephin and azithromycin.  Hospital course: Patient admitted to [facility] for further management. Cardiology consulted. During the hospital course she did report feeling dizzy x 2 weeks . Noted to be orthostatic , AM cortisol low at 4. Hgb has down-trended to 7.7 and nurse reported guaiac positive "dark" stools.She was put on oxygen on 9/3-9/4 night due to desat to 89% on room air with exertion--weaned off 9/5 although still reports exertional dyspnea.HR controlled now, Cardiology signed off. [facility] Dr. Michail Sermon is consulted, plan to do EGD/colonoscopy on 9/7.Lisinopril changed to Losartan.  Subjective:  Patient returned from EGD/colonoscopy.  She was feeling somewhat tired this morning and was planned for discharge if continues to do well and ate regular diet  this afternoon.  Patient however complained of dizziness and "not feeling well", was tachycardic on ambulating per bedside nurse with heart rate going up to 160s.  Objective: Vitals:   10/19/19 0940 10/19/19 0950 10/19/19 1037 10/19/19 1500  BP: 123/69 131/64 130/86   Pulse: 94 87 72   Resp: 17 12 14    Temp:   97.6 F (36.4 C) 98.2 F (36.8 C)  TempSrc:   Axillary Oral  SpO2: 96% 93% 93% 93%  Weight:      Height:        Intake/Output Summary (Last 24 hours) at 10/19/2019 1702 Last data filed at 10/19/2019 1300 Gross per 24 hour  Intake 540 ml  Output 700 ml  Net -160 ml   Filed Weights   10/12/19 1410 10/13/19 0414  Weight: 77.1 kg 82 kg    Physical Examination:  General: Moderately built, no acute distress noted Head ENT: Atraumatic normocephalic, PERRLA, neck supple Heart: S1-S2 heard, regular rate and rhythm, no murmurs.  No leg edema noted Lungs: Equal air entry bilaterally, no rhonchi but noted to have some left basal rales, no accessory muscle use Abdomen: Bowel sounds heard, soft, nontender, nondistended. No organomegaly.  No CVA tenderness Extremities: No pedal edema.  No cyanosis or clubbing. Neurological: Awake alert oriented x3, no focal weakness or numbness, strength and sensations to crude touch intact Skin: No wounds or rashes.    Data Reviewed: I have personally reviewed following labs and imaging studies  CBC: Recent Labs  Lab 10/14/19 0300 10/14/19 0300 10/15/19 0541 10/16/19 0320 10/17/19 0325 10/18/19 0447 10/19/19 0533  WBC 8.1   < > 8.2 8.3  7.6 7.5 6.0  NEUTROABS 6.7  --  5.9  --  5.6  --   --   HGB 8.3*   < > 8.9* 7.6* 7.7* 8.0* 7.8*  HCT 30.0*   < > 30.4* 27.2* 27.3* 28.3* 27.9*  MCV 78.1*   < > 76.0* 77.1* 78.4* 77.1* 78.4*  PLT 192   < > 217 203 191 202 197   < > = values in this interval not displayed.   Basic Metabolic Panel: Recent Labs  Lab 10/14/19 0300 10/15/19 0541 10/15/19 1113 10/16/19 0320 10/17/19 0325 10/18/19 0447  10/19/19 0533  NA 142   < > 137 136 140 141 140  K 4.8   < > 4.8 4.3 4.5 4.4 4.2  CL 113*   < > 108 104 109 106 110  CO2 21*   < > 20* 23 23 22  20*  GLUCOSE 104*   < > 157* 123* 101* 102* 103*  BUN 15   < > 17 21 23 16 11   CREATININE 0.71   < > 0.85 0.82 0.72 0.79 0.73  CALCIUM 8.2*   < > 8.3* 8.1* 8.3* 8.5* 8.4*  MG 2.1  --   --  1.7  --   --   --    < > = values in this interval not displayed.   GFR: Estimated Creatinine Clearance: 49.2 mL/min (by C-G formula based on SCr of 0.73 mg/dL). Liver Function Tests: Recent Labs  Lab 10/13/19 0244  AST 15  ALT 13  ALKPHOS 53  BILITOT 1.2  PROT 6.4*  ALBUMIN 3.2*   No results for input(s): LIPASE, AMYLASE in the last 168 hours. No results for input(s): AMMONIA in the last 168 hours. Coagulation Profile: Recent Labs  Lab 10/17/19 0325  INR 1.2   Cardiac Enzymes: No results for input(s): CKTOTAL, CKMB, CKMBINDEX, TROPONINI in the last 168 hours. BNP (last 3 results) No results for input(s): PROBNP in the last 8760 hours. HbA1C: No results for input(s): HGBA1C in the last 72 hours. CBG: No results for input(s): GLUCAP in the last 168 hours. Lipid Profile: No results for input(s): CHOL, HDL, LDLCALC, TRIG, CHOLHDL, LDLDIRECT in the last 72 hours. Thyroid Function Tests: Recent Labs    10/18/19 0445 10/18/19 0447  TSH  --  6.806*  FREET4 1.08  --    Anemia Panel: No results for input(s): VITAMINB12, FOLATE, FERRITIN, TIBC, IRON, RETICCTPCT in the last 72 hours. Sepsis Labs: Recent Labs  Lab 10/15/19 0541 10/16/19 0320  PROCALCITON <0.10 <0.10    Recent Results (from the past 240 hour(s))  Culture, blood (Routine X 2) w Reflex to ID Panel     Status: None   Collection Time: 10/12/19  4:04 PM   Specimen: BLOOD  Result Value Ref Range Status   Specimen Description   Final    BLOOD LEFT ARM Performed at [facility] [address], [place], [place] [address]    Special Requests   Final     BOTTLES DRAWN AEROBIC AND ANAEROBIC Blood Culture adequate volume Performed at [facility] [address] [place], [place] [address]    Culture   Final    NO GROWTH 5 DAYS Performed at [facility], [place] [address], [place], [place] [address]    Report Status 10/17/2019 FINAL  Final  SARS Coronavirus 2 by RT PCR (hospital order, performed in [facility] hospital lab) Nasopharyngeal Nasopharyngeal Swab     Status: None  Collection Time: 10/12/19  4:12 PM   Specimen: Nasopharyngeal Swab  Result Value Ref Range Status   SARS Coronavirus 2 NEGATIVE NEGATIVE Final    Comment: (NOTE) SARS-CoV-2 target nucleic acids are NOT DETECTED.  The SARS-CoV-2 RNA is generally detectable in upper and lower respiratory specimens during the acute phase of infection. The lowest concentration of SARS-CoV-2 viral copies this assay can detect is 250 copies / mL. A negative result does not preclude SARS-CoV-2 infection and should not be used as the sole basis for treatment or other patient management decisions.  A negative result may occur with improper specimen collection / handling, submission of specimen other than nasopharyngeal swab, presence of viral mutation(s) within the areas targeted by this assay, and inadequate number of viral copies (<250 copies / mL). A negative result must be combined with clinical observations, patient history, and epidemiological information.  Fact Sheet for Patients:   StrictlyIdeas.no  Fact Sheet for Healthcare Providers: BankingDealers.co.za  This test is not yet approved or  cleared by the [place] FDA and has been authorized for detection and/or diagnosis of SARS-CoV-2 by FDA under an Emergency Use Authorization (EUA).  This EUA will remain in effect (meaning this test can be used) for the duration of the COVID-19 declaration under Section 564(b)(1) of the Act, 21 U.S.C. section  360bbb-3(b)(1), unless the authorization is terminated or revoked sooner.  Performed at [facility], [place] [address], [place], [place] [address]   Culture, blood (Routine X 2) w Reflex to ID Panel     Status: None   Collection Time: 10/12/19  4:19 PM   Specimen: BLOOD RIGHT FOREARM  Result Value Ref Range Status   Specimen Description   Final    BLOOD RIGHT FOREARM Performed at [facility] [address], [place], [place] [address]    Special Requests   Final    BOTTLES DRAWN AEROBIC AND ANAEROBIC Blood Culture adequate volume Performed at [facility] [address], [place], [place] [address]    Culture   Final    NO GROWTH 5 DAYS Performed at [facility], [place] [address] [place], [place] [address]    Report Status 10/17/2019 FINAL  Final  MRSA PCR Screening     Status: Abnormal   Collection Time: 10/12/19  6:41 PM   Specimen: Nasopharyngeal  Result Value Ref Range Status   MRSA by PCR POSITIVE (A) NEGATIVE Final    Comment:        The GeneXpert MRSA Assay (FDA approved for NASAL specimens only), is one component of a comprehensive MRSA colonization surveillance program. It is not intended to diagnose MRSA infection nor to guide or monitor treatment for MRSA infections. RESULT CALLED TO, READ BACK BY AND VERIFIED WITH: MCILQUHAM,D. RN @2103  ON 08.31.2021 BY COHEN,K Performed at [facility], [place] [address] [place],  [address]       Radiology Studies: No results found.    Scheduled Meds: . Chlorhexidine Gluconate Cloth  6 each Topical Q0600  . feeding supplement (ENSURE ENLIVE)  237 mL Oral BID BM  . fluticasone  2 spray Each Nare Daily  . guaiFENesin  600 mg Oral BID  . levothyroxine  125 mcg Oral Q0600  . lidocaine  1 patch Transdermal Q24H  . mouth rinse  15 mL Mouth Rinse BID  . metoprolol succinate  75 mg Oral Daily  . pantoprazole  40 mg Oral Daily  .  senna-docusate  1 tablet Oral BID  . sertraline  50 mg Oral Daily  . sodium chloride flush  10-40 mL Intracatheter Q12H   Continuous Infusions: . sodium chloride 10 mL/hr at 10/17/19 1302  . diltiazem (CARDIZEM) infusion Stopped (10/13/19 1616)     Assessment/Plan:  1.Atrial fibrillation with RVR: Patient has history of chronic A. fib but not been on anticoagulation due to anemia requiring multiple transfusions. She is now off Cardizem drip, transitioned to Lopressor and heart rate improved. Seen by cardiology and echocardiogram showed EF 40 to 45%, severe  biatrial enlargement.HStroponin at 10.  Tachycardic today on exertion in the setting of anemia and orthostatic hypotension.  Will give blood transfusion and monitor response.  2.Left lower lobe pneumonia:Urine streptococcal antigen positive, finished antibiotic 5-day course with Rocephin and azithromycin 9/6. No further cough or dysnea or hypoxia.  3.Acute hypoxic respiratory failure: Patient required 2 L O2 on 9/3-9/4 as noted to have desaturated to 89% on room air with exertion. Could be related to pneumonia/mild systolic CHF as echocardiogram showed EF 40 to 45%, BNP 344.  She did well on walking desat studies in terms of O2 saturation but got tachycardic and felt weak/lightheaded.  4.Recurrent microcytic anemia:Patient reports prior outpatient GI and hematology (Dr. Truddie Coco) work-up. She underwent EGD/colonoscopy in the past with no source of bleeding identified. She denies undergoing capsule endoscopy. She is noted to be guaiac positive with dark brown stools while here. Seen by GI Dr. Michail Sermon and underwent EGD/colonoscopy on 9/7  EGD showed large hiatal hernia with Cameron's erosions. No active bleeding.Colonoscopy showed few small polyps, diverticulosis and hemorrhoids. No active bleeding. Gi recommended to start Protonix 40 mg once a day and iron supplements on discharge.   Since patient complaining of weakness and  tachycardic on exertion (which could also be related to orthostasis), will give 1 unit of PRBC for presumed symptomatic anemia.  5.Orthostatic hypotension:Patient reported feeling dizzy during the hospital course, mainly on standing or walking. She states she was advised support stockings in the past but was not very compliant. Random cortisol level was low at 4.0 0n 9/5. Repeat level 9/6 morning adequate at 19 and with appropriate response to stim test. Patient advised to resume support stockings at least during the day. Avoid venodilators.  Requested nurse to recheck orthostatics with TED hose in place.  7.Essential hypertension:Currently on Lopressor 75 mg long acting (started for A.fib) and lisinopril changed to losartan due to complaints of chronic cough. Avoid venodilator's as explained above. Given standing BP drop to as low as 80s, and need for lopressor continuation, will d/c Losartan until PCP F/U.   8.Hypothyroidism:TSH 7.9,normal T4 levels but notedincreasedSynthroiddosage by my colleague ( 100 to 125 mcg), will discharge on 112 mcg andshould haverepeat TSH in 3 weeks as TSH is acute illness not reliable.   9.Obesity:Body mass index is 33.06 kg/m.Diet and lifestyle modifications  10.Deconditioning: Seen by PT who recommended home health upon discharge.Support stockings  DVT prophylaxis: Lovenox held, SCD/TED hose Code Status: DNR Family / Patient Communication: Discussed with patient in detail Disposition Plan:   Status is: Inpatient  Remains inpatient appropriate because:Ongoing diagnostic testing needed not appropriate for outpatient work up   Dispo: The patient is from: Home              Anticipated d/c is to: Home              Anticipated d/c date is: 1 day  Patient currently is not medically stable to d/c.  Guilford Shi, MD [facility] Pager in [place]  If 7PM-7AM, please contact  night-coverage [email or web address] 10/18/2019, 3:20 PM Admit date:10/12/2019

## 2019-10-19 NOTE — Discharge Summary (Addendum)
Physician Discharge Summary  Katrina Randolph QMG:[id number] DOB: [birth year] DOA: 10/12/2019  PCP: [name], PA-C  Admit date: 10/12/2019 Discharge date: 10/20/2019 Consultations: Cardiology, Gastroenetrology Admitted From: home Disposition: home   Discharge Diagnoses:  Principal Problem:   Rapid atrial fibrillation ([place]) Active Problems:   Pneumonia   Anemia requiring transfusions   DNR (do not resuscitate)   Acquired thrombophilia ([place])   Acute respiratory failure ([place])   Colon polyp   Orthostatic dizziness   Hypothyroidism  Hospital Course Summary: 86 y.o.femalewith h/o chronic A. fib over the past 10 years,not on anticoagulation due to anemia requiring multiple transfusions and no clear source,  hypothyroidism presented  with 2-week history of shortness of breath associated with some chest discomfort made worse with mild to moderate exertion. Patient moved to [place] recently from [place] and [place]. Patient followed up with her PCP on the afternoon of hospital presentation where she was found to have tachycardia, HR>160 bpm. Patient was subsequently referred to the ED for further work-up.Patient does report chronic dry cough and attributes to lisinopril use, but no fever/chills ED Course: Afebrile, Tachy with HR in the 120s to 150s. Potassium noted to be 4.9, sodium 143, retinae 0.95. WBC noted to be 7.7 with hemoglobin 9.5 and platelet count of 239. Patient was started on Cardizem drip with rate controlled. Chest x-ray noted a left lower lobe infiltrate-atelectasis vs pneumonia. Patient was given 1 dose of Rocephin and azithromycin.  Hospital course: Patient admitted to [facility] for further management. Cardiology consulted. During the hospital course she did report feeling dizzy x 2 weeks . Noted to be orthostatic , AM cortisol low at 4. Hgb has down-trended to 7.7 and nurse reported guaiac positive "dark" stools.She was put  on oxygen on 9/3-9/4 night due to desat to 89% on room air with exertion--weaned off 9/5 although still reports exertional dyspnea.HR controlled now, Cardiology signed off. [facility] Dr. Michail Sermon is consulted, plan to do EGD/colonoscopy on 9/7.Lisinopril changed to Losartan.  1.  Atrial fibrillation with RVR: Patient has history of chronic A. fib but not been on anticoagulation due to anemia requiring multiple transfusions. Seen by cardiology and echocardiogram showed EF 40 to 45%, severe  biatrial enlargement. HS troponin at 10.  Initially managed with Cardizem drip. She is now off Cardizem drip, transitioned to Toprol-XL 75 mg and heart rate improved at rest but still tachycardic on exertion to 130s-150s.  Discussed with Dr. Orlene Plum who recommended discharging on Toprol-XL 100 mg with close outpatient follow-up which has been set up in 1 week. Cardiology initially had recommended losartan for reduced EF but given orthostasis and need for increasing beta-blocker, will hold off on losartan as discussed with patient, daughter and cardiologist.  Patient unfortunately not a candidate for chronic anticoagulation as determined by cardiology previously and now--making her not a suitable candidate for cardioversion (chemical or electrical).  I did discuss with daughter regarding watching blood pressures closely at home and documenting for review by MD on follow-up visit.  Called pharmacy and requested to issue prescriptions for Toprol-XL 50 mg, 25 mg tabs for ease of titration at home (between 75 to 100 mg daily dosing)  2.  Left lower lobe pneumonia: Urine streptococcal antigen positive, finished antibiotic 5-day course with Rocephin and azithromycin 9/6. No further cough or dysnea or hypoxia.  3.  Acute hypoxic respiratory failure: Patient required 2 L O2 on 9/3-9/4 as noted to have desaturated to 89% on room air with exertion.  Could be related to pneumonia. Clinically unable to determine if small component of  acute combined CHF as echocardiogram showed EF 40 to 45%, BNP 344 but clinically appears euvolemic.  She is saturating well now on room air at rest as well as exertion (O2 desat studies obtained today by PT).   She is advised to take Lasix as needed for fluid retention upon discharge.  4.  Recurrent microcytic anemia: Patient reports prior outpatient GI and hematology (Dr. Truddie Coco) work-up.  She underwent EGD/colonoscopy in the past with no source of bleeding identified.  She denies undergoing capsule endoscopy.  She is noted to be guaiac positive with dark brown stools while here.  Seen by GI Dr. Michail Sermon and underwent EGD/colonoscopy on 9/7  EGD showed large hiatal hernia with Cameron's erosions.  No active bleeding.Colonoscopy showed few small polyps, diverticulosis and hemorrhoids.  No active bleeding. Gi recommended to start Protonix 40 mg once a day and iron supplements on discharge.   5.  Orthostatic hypotension: Patient reported feeling dizzy during the hospital course, mainly on standing or walking.  She states she was advised support stockings in the past but was not very compliant.  Random cortisol level was low at 4.0 0n 9/5.  Repeat level 9/6 morning adequate at 19 and with appropriate response to stim test.  Patient advised to resume support stockings at least during the day.  Avoid venodilators.  Hold off on losartan as discussed above.  Recommended compression stockings and educated daughter regarding use, importance of compliance.  Tachycardia could be in response to orthostasis as it is happening only upon ambulation.  Advised patient to avoid exertion.  7.  Essential hypertension: Currently on Lopressor 75 mg long acting (started for A.fib) and lisinopril changed to losartan  initiallydue to complaints of chronic cough.  Avoid venodilator's as explained above. Given standing BP drop to as low as 80s, and need for lopressor continuation, discontinued  Losartan until PCP AND CARDIOLOGY F/U.    8.  Hypothyroidism:TSH 7.9, normal T4 levels ->notedincreasedSynthroid dosage by my colleague ( 100 to 125 mcg), will discharge on 112 mcg and should haverepeat TSH in 3 weeks as TSH is acute illness not reliable.  Also concerned about exacerbating tachycardia with over replacement.  9.  Obesity: Body mass index is 33.06 kg/m.  Diet and lifestyle modifications  10.  Deconditioning: Seen by PT who recommended home health upon discharge.Support stockings issued and educated patient/family regarding daily use, can remove at night.   Discharge Exam:   Vitals:   10/20/19 0300 10/20/19 0500 10/20/19 0539 10/20/19 1337  BP:   119/85   Pulse:   99   Resp: 14 15 20    Temp:   98.2 F (36.8 C) 98.1 F (36.7 C)  TempSrc:   Oral Oral  SpO2:   95% 96%  Weight:      Height:        General: Pt is alert, awake, not in acute distress Cardiovascular: RRR, S1/S2 +, no rubs, no gallops Respiratory: CTA bilaterally, no wheezing, no rhonchi Abdominal: Soft, NT, ND, bowel sounds + Extremities: no edema currently, prominent veins, no cyanosis  Discharge Condition:Stable CODE STATUS: DNR Diet recommendation: Heart healthy Recommendations for Outpatient Follow-up:  1. Follow up with PCP: 1 week 2. Follow up with consultants: GI as needed, [facility] cardiology in 1 week as scheduled 3. Please obtain follow up labs including: CBC, orthostatic check,repeat TSH in 3 weeks as TSH is acute illness not reliable.   Home Health  services upon discharge: Home PT (unable to set up home health RN as discussed with social worker) Equipment/Devices upon discharge:Support stockings   Discharge Instructions:  Discharge Instructions    ([place]) Call MD:  Anytime you have any of the following symptoms: 1) 3 pound weight gain in 24 hours or 5 pounds in 1 week 2) shortness of breath, with or without a dry hacking cough 3) swelling in the hands, feet or stomach 4) if you have to sleep on extra  pillows at night in order to breathe.   Complete by: As directed    Activity as tolerated - No restrictions   Complete by: As directed    Support stockings during the day   Call MD for:  difficulty breathing, headache or visual disturbances   Complete by: As directed    Call MD for:  extreme fatigue   Complete by: As directed    Call MD for:  persistant dizziness or light-headedness   Complete by: As directed    Call MD for:  persistant nausea and vomiting   Complete by: As directed    Call MD for:  severe uncontrolled pain   Complete by: As directed    Call MD for:  temperature >100.4   Complete by: As directed    Diet - low sodium heart healthy   Complete by: As directed      Allergies as of 10/20/2019   No Known Allergies     Medication List    STOP taking these medications   ibuprofen 800 MG tablet Commonly known as: ADVIL   lisinopril 10 MG tablet Commonly known as: ZESTRIL     TAKE these medications   feeding supplement (ENSURE ENLIVE) Liqd Take 237 mLs by mouth 2 (two) times daily between meals.   ferrous sulfate 325 (65 FE) MG tablet Take 1 tablet (325 mg total) by mouth 2 (two) times daily with a meal.   furosemide 20 MG tablet Commonly known as: LASIX Take 20 mg by mouth daily as needed for fluid.   levothyroxine 112 MCG tablet Commonly known as: SYNTHROID Take 1 tablet (112 mcg total) by mouth every morning. What changed:   medication strength  how much to take   metoprolol succinate 25 MG 24 hr tablet Commonly known as: Toprol XL Take 1-2 tablets (25-50 mg total) by mouth daily.   metoprolol succinate 50 MG 24 hr tablet Commonly known as: TOPROL-XL Take 1 tablet (50 mg total) by mouth daily. Take with or immediately following a meal. Start taking on: October 21, 2019   pantoprazole 40 MG tablet Commonly known as: PROTONIX Take 1 tablet (40 mg total) by mouth daily.   senna-docusate 8.6-50 MG tablet Commonly known as: Senokot-S Take 1  tablet by mouth 2 (two) times daily.   sertraline 50 MG tablet Commonly known as: ZOLOFT Take 50 mg by mouth daily.   traMADol 50 MG tablet Commonly known as: ULTRAM Take 50 mg by mouth every 6 (six) hours as needed for moderate pain.   traZODone 50 MG tablet Commonly known as: DESYREL Take 50 mg by mouth at bedtime as needed for sleep.       Follow-up Information    Deberah Pelton, NP Follow up on 10/28/2019.   Specialty: Cardiology Why: 11:15AM. Cardiology follow up with Dr. Rosezella [place] nurse practitioner. Contact information: [address] [place] [address] [phone]              No Known  Allergies    The results of significant diagnostics from this hospitalization (including imaging, microbiology, ancillary and laboratory) are listed below for reference.    Labs: BNP (last 3 results) Recent Labs    10/12/19 1427  BNP 914.7*   Basic Metabolic Panel: Recent Labs  Lab 10/14/19 0300 10/15/19 0541 10/16/19 0320 10/17/19 0325 10/18/19 0447 10/19/19 0533 10/20/19 0528  NA 142   < > 136 140 141 140 145  K 4.8   < > 4.3 4.5 4.4 4.2 4.3  CL 113*   < > 104 109 106 110 113*  CO2 21*   < > 23 23 22  20* 22  GLUCOSE 104*   < > 123* 101* 102* 103* 103*  BUN 15   < > 21 23 16 11 10   CREATININE 0.71   < > 0.82 0.72 0.79 0.73 0.84  CALCIUM 8.2*   < > 8.1* 8.3* 8.5* 8.4* 8.7*  MG 2.1  --  1.7  --   --   --   --    < > = values in this interval not displayed.   Liver Function Tests: No results for input(s): AST, ALT, ALKPHOS, BILITOT, PROT, ALBUMIN in the last 168 hours. No results for input(s): LIPASE, AMYLASE in the last 168 hours. No results for input(s): AMMONIA in the last 168 hours. CBC: Recent Labs  Lab 10/14/19 0300 10/14/19 0300 10/15/19 0541 10/15/19 0541 10/16/19 0320 10/17/19 0325 10/18/19 0447 10/19/19 0533 10/20/19 0528  WBC 8.1   < > 8.2   < > 8.3 7.6 7.5 6.0 5.1  NEUTROABS 6.7  --  5.9  --   --  5.6  --   --   --    HGB 8.3*   < > 8.9*   < > 7.6* 7.7* 8.0* 7.8* 8.4*  HCT 30.0*   < > 30.4*   < > 27.2* 27.3* 28.3* 27.9* 29.1*  MCV 78.1*   < > 76.0*   < > 77.1* 78.4* 77.1* 78.4* 79.7*  PLT 192   < > 217   < > 203 191 202 197 174   < > = values in this interval not displayed.   Cardiac Enzymes: No results for input(s): CKTOTAL, CKMB, CKMBINDEX, TROPONINI in the last 168 hours. BNP: Invalid input(s): POCBNP CBG: No results for input(s): GLUCAP in the last 168 hours. D-Dimer No results for input(s): DDIMER in the last 72 hours. Hgb A1c No results for input(s): HGBA1C in the last 72 hours. Lipid Profile No results for input(s): CHOL, HDL, LDLCALC, TRIG, CHOLHDL, LDLDIRECT in the last 72 hours. Thyroid function studies Recent Labs    10/18/19 0447  TSH 6.806*   Anemia work up No results for input(s): VITAMINB12, FOLATE, FERRITIN, TIBC, IRON, RETICCTPCT in the last 72 hours. Urinalysis No results found for: COLORURINE, APPEARANCEUR, [place], [place], [place], [place], [place], [place], PROTEINUR, UROBILINOGEN, NITRITE, LEUKOCYTESUR Sepsis Labs Invalid input(s): PROCALCITONIN,  WBC,  LACTICIDVEN Microbiology Recent Results (from the past 240 hour(s))  Culture, blood (Routine X 2) w Reflex to ID Panel     Status: None   Collection Time: 10/12/19  4:04 PM   Specimen: BLOOD  Result Value Ref Range Status   Specimen Description   Final    BLOOD LEFT ARM Performed at [place] [address], [place], [place] [address]    Special Requests   Final    BOTTLES DRAWN AEROBIC AND ANAEROBIC Blood Culture adequate volume Performed at [facility],  [place] [address] [place], [place] [address]    Culture   Final    NO GROWTH 5 DAYS Performed at [facility], [place] [address], [place], [place] [address]    Report Status 10/17/2019 FINAL  Final  SARS Coronavirus 2 by RT PCR (hospital order, performed in [facility] hospital lab) Nasopharyngeal  Nasopharyngeal Swab     Status: None   Collection Time: 10/12/19  4:12 PM   Specimen: Nasopharyngeal Swab  Result Value Ref Range Status   SARS Coronavirus 2 NEGATIVE NEGATIVE Final    Comment: (NOTE) SARS-CoV-2 target nucleic acids are NOT DETECTED.  The SARS-CoV-2 RNA is generally detectable in upper and lower respiratory specimens during the acute phase of infection. The lowest concentration of SARS-CoV-2 viral copies this assay can detect is 250 copies / mL. A negative result does not preclude SARS-CoV-2 infection and should not be used as the sole basis for treatment or other patient management decisions.  A negative result may occur with improper specimen collection / handling, submission of specimen other than nasopharyngeal swab, presence of viral mutation(s) within the areas targeted by this assay, and inadequate number of viral copies (<250 copies / mL). A negative result must be combined with clinical observations, patient history, and epidemiological information.  Fact Sheet for Patients:   StrictlyIdeas.no  Fact Sheet for Healthcare Providers: BankingDealers.co.za  This test is not yet approved or  cleared by the [place] FDA and has been authorized for detection and/or diagnosis of SARS-CoV-2 by FDA under an Emergency Use Authorization (EUA).  This EUA will remain in effect (meaning this test can be used) for the duration of the COVID-19 declaration under Section 564(b)(1) of the Act, 21 U.S.C. section 360bbb-3(b)(1), unless the authorization is terminated or revoked sooner.  Performed at [facility], [place] [address] [place], [place] [address]   Culture, blood (Routine X 2) w Reflex to ID Panel     Status: None   Collection Time: 10/12/19  4:19 PM   Specimen: BLOOD RIGHT FOREARM  Result Value Ref Range Status   Specimen Description   Final    BLOOD RIGHT FOREARM Performed at [place] [address], [place], [place] [address]    Special Requests   Final    BOTTLES DRAWN AEROBIC AND ANAEROBIC Blood Culture adequate volume Performed at [place] [address] [place], [place] [address]    Culture   Final    NO GROWTH 5 DAYS Performed at [facility], [place] [address], [place], [place] [address]    Report Status 10/17/2019 FINAL  Final  MRSA PCR Screening     Status: Abnormal   Collection Time: 10/12/19  6:41 PM   Specimen: Nasopharyngeal  Result Value Ref Range Status   MRSA by PCR POSITIVE (A) NEGATIVE Final    Comment:        The GeneXpert MRSA Assay (FDA approved for NASAL specimens only), is one component of a comprehensive MRSA colonization surveillance program. It is not intended to diagnose MRSA infection nor to guide or monitor treatment for MRSA infections. RESULT CALLED TO, READ BACK BY AND VERIFIED WITH: MCILQUHAM,D. RN @2103  ON 08.31.2021 BY COHEN,K Performed at [facility], [place] [address] [place], [place] [address]     Procedures/Studies: DG Chest Port 1 View  Result Date: 10/12/2019 CLINICAL DATA:  Worsened shortness of breath and dizziness over the last 2 weeks. EXAM: PORTABLE CHEST 1  VIEW COMPARISON:  None. FINDINGS: Mild cardiomegaly. Aortic atherosclerotic calcification. The right lung is clear. There is infiltrate and volume loss in the left lower lobe consistent with pneumonia. Possible small amount of pleural fluid on the left. No significant bone finding. IMPRESSION: 1. Left lower lobe pneumonia and volume loss. Possible small amount of pleural fluid on the left. 2. Mild cardiomegaly. Aortic atherosclerosis. Electronically Signed   By: Nelson Chimes M.D.   On: 10/12/2019 15:07   ECHOCARDIOGRAM COMPLETE  Result Date: 10/13/2019    ECHOCARDIOGRAM REPORT   Patient Name:   Katrina Randolph Date of Exam: 10/13/2019 Medical Rec #:  [id number]         Height:       62.0 in  Accession #:    [id number]        Weight:       180.8 lb Date of Birth:  1930-08-03         BSA:          1.831 m Patient Age:    86 years          BP:           110/71 mmHg Patient Gender: F                 HR:           75 bpm. Exam Location:  Inpatient Procedure: 2D Echo, 3D Echo and Strain Analysis Indications:    Atrial Fibrillation 427.31 / I48.91  History:        Patient has no prior history of Echocardiogram examinations.                 Arrythmias:Atrial Fibrillation; Risk Factors:Hypertension.                 Anemia, Pneumonia, Acquired thrombophilia, Hypothyroidism.  Sonographer:    Darlina Sicilian RDCS Referring Phys: [place]  1. Left ventricular ejection fraction, by estimation, is 40 to 45%. The left ventricle has mildly decreased function. The left ventricle demonstrates global hypokinesis. Left ventricular diastolic function could not be evaluated.  2. Right ventricular systolic function is normal. The right ventricular size is normal. There is moderately elevated pulmonary artery systolic pressure.  3. Left atrial size was severely dilated.  4. Right atrial size was severely dilated.  5. The mitral valve is normal in structure. Mild mitral valve regurgitation. No evidence of mitral stenosis.  6. The aortic valve is normal in structure. Aortic valve regurgitation is not visualized. No aortic stenosis is present.  7. The inferior vena cava is dilated in size with <50% respiratory variability, suggesting right atrial pressure of 15 mmHg. FINDINGS  Left Ventricle: Left ventricular ejection fraction, by estimation, is 40 to 45%. The left ventricle has mildly decreased function. The left ventricle demonstrates global hypokinesis. The left ventricular internal cavity size was normal in size. There is  no left ventricular hypertrophy. Left ventricular diastolic function could not be evaluated due to atrial fibrillation. Left ventricular diastolic function could not be evaluated. Right  Ventricle: The right ventricular size is normal. No increase in right ventricular wall thickness. Right ventricular systolic function is normal. There is moderately elevated pulmonary artery systolic pressure. The tricuspid regurgitant velocity is 3.09 m/s, and with an assumed right atrial pressure of 15 mmHg, the estimated right ventricular systolic pressure is 60.6 mmHg. Left Atrium: Left atrial size was severely dilated. Right Atrium: Right atrial size was severely dilated. Pericardium: Trivial pericardial effusion  is present. The pericardial effusion is circumferential. Mitral Valve: The mitral valve is normal in structure. Normal mobility of the mitral valve leaflets. Mild mitral valve regurgitation, with centrally-directed jet. No evidence of mitral valve stenosis. Tricuspid Valve: The tricuspid valve is normal in structure. Tricuspid valve regurgitation is mild . No evidence of tricuspid stenosis. Aortic Valve: The aortic valve is normal in structure. Aortic valve regurgitation is not visualized. No aortic stenosis is present. Pulmonic Valve: The pulmonic valve was normal in structure. Pulmonic valve regurgitation is not visualized. No evidence of pulmonic stenosis. Aorta: The aortic root is normal in size and structure. Venous: The inferior vena cava is dilated in size with less than 50% respiratory variability, suggesting right atrial pressure of 15 mmHg. IAS/Shunts: No atrial level shunt detected by color flow Doppler.  LEFT VENTRICLE PLAX 2D LVIDd:         4.80 cm LVIDs:         3.90 cm LV PW:         1.00 cm LV IVS:        0.90 cm LVOT diam:     2.00 cm  3D Volume EF: LV SV:         39       3D EF:        44 % LV SV Index:   21       LV EDV:       104 ml LVOT Area:     3.14 cm LV ESV:       58 ml                         LV SV:        46 ml RIGHT VENTRICLE TAPSE (M-mode): 1.2 cm LEFT ATRIUM             Index       RIGHT ATRIUM           Index LA diam:        5.30 cm 2.89 cm/m  RA Area:     31.90 cm LA  Vol (A2C):   94.6 ml 51.66 ml/m RA Volume:   112.00 ml 61.16 ml/m LA Vol (A4C):   84.7 ml 46.25 ml/m LA Biplane Vol: 89.7 ml 48.98 ml/m  AORTIC VALVE LVOT Vmax:   65.30 cm/s LVOT Vmean:  49.800 cm/s LVOT VTI:    0.125 m  AORTA Ao Root diam: 3.70 cm MITRAL VALVE                TRICUSPID VALVE MV Area (PHT): 4.15 cm     TR Peak grad:   38.2 mmHg MV Decel Time: 183 msec     TR Vmax:        309.00 cm/s MR Peak grad: 87.6 mmHg MR Mean grad: 53.0 mmHg     SHUNTS MR Vmax:      468.00 cm/s   Systemic VTI:  0.12 m MR Vmean:     345.0 cm/s    Systemic Diam: 2.00 cm MV E velocity: 125.33 cm/s Dani Gobble Croitoru MD Electronically signed by Sanda Klein MD Signature Date/Time: 10/13/2019/12:50:44 PM    Final     Time coordinating discharge: Over 30 minutes  SIGNED:   [place]ford Shi, MD  [facility] 10/20/2019, 2:48 PM

## 2019-10-19 NOTE — Progress Notes (Signed)
Pt ambulated in hallway with Physical Therapy heart rate with ambulation increased to 150s-160s. O2 Saturation 93%. Pt returned to room and assisted to chair. Heart Rate with rest now 100-116. Pt denies any needs. MD made aware and discharge held for today. Pt and family updated

## 2019-10-19 NOTE — Transfer of Care (Signed)
Immediate Anesthesia Transfer of Care Note  Patient: Katrina Randolph  Procedure(s) Performed: COLONOSCOPY WITH PROPOFOL (N/A ) ESOPHAGOGASTRODUODENOSCOPY (EGD) WITH PROPOFOL (N/A ) POLYPECTOMY  Patient Location: Endoscopy Unit  Anesthesia Type:MAC  Level of Consciousness: drowsy, patient cooperative and responds to stimulation  Airway & Oxygen Therapy: Patient Spontanous Breathing and Patient connected to face mask oxygen  Post-op Assessment: Report given to RN and Post -op Vital signs reviewed and stable  Post vital signs: Reviewed and stable  Last Vitals:  Vitals Value Taken Time  BP    Temp    Pulse 97 10/19/19 0924  Resp 10 10/19/19 0924  SpO2 100 % 10/19/19 0924  Vitals shown include unvalidated device data.  Last Pain:  Vitals:   10/19/19 0822  TempSrc:   PainSc: 0-No pain      Patients Stated Pain Goal: 2 (88/64/84 7207)  Complications: No complications documented.

## 2019-10-19 NOTE — Progress Notes (Signed)
[facility] Gastroenterology Progress Note  Katrina Randolph 86 y.o. 09/24/1930  CC: Anemia, heme positive stool   Subjective: Patient seen and examined at bedside in the Endo unit.  Denies any further bleeding.     Objective: Vital signs in last 24 hours: Vitals:   10/19/19 0600 10/19/19 0814  BP: (!) 141/66 (!) 150/89  Pulse: (!) 50 61  Resp: 15 18  Temp: 98 F (36.7 C) 98.8 F (37.1 C)  SpO2: 94% 92%    Physical Exam:  General-elderly patient not in acute distress Heart-irregular rhythm.  Tachycardia noted Abdomen-soft, nontender, nondistended, bowel sounds present Neuro-alert/oriented x3 Psych-mood and affect normal  Lab Results: Recent Labs    10/18/19 0447 10/19/19 0533  NA 141 140  K 4.4 4.2  CL 106 110  CO2 22 20*  GLUCOSE 102* 103*  BUN 16 11  CREATININE 0.79 0.73  CALCIUM 8.5* 8.4*   No results for input(s): AST, ALT, ALKPHOS, BILITOT, PROT, ALBUMIN in the last 72 hours. Recent Labs    10/17/19 0325 10/17/19 0325 10/18/19 0447 10/19/19 0533  WBC 7.6   < > 7.5 6.0  NEUTROABS 5.6  --   --   --   HGB 7.7*   < > 8.0* 7.8*  HCT 27.3*   < > 28.3* 27.9*  MCV 78.4*   < > 77.1* 78.4*  PLT 191   < > 202 197   < > = values in this interval not displayed.   Recent Labs    10/17/19 0325  LABPROT 15.1  INR 1.2      Assessment/Plan: -Anemia with heme positive stool.  Hemoglobin stable. -Atrial fibrillation.  Not on anticoagulation because of recurrent anemia.  INR 1.2 -Left lower lobe pneumonia.  Status post treatment with antibiotics  Recommendations ------------------------- -Proceed with EGD and colonoscopy today.  Risks (bleeding, infection, bowel perforation that could require surgery, sedation-related changes in cardiopulmonary systems), benefits (identification and possible treatment of source of symptoms, exclusion of certain causes of symptoms), and alternatives (watchful waiting, radiographic imaging studies, empiric medical treatment)   were explained to patient  in detail and patient wishes to proceed.   [name] MD, [place] 10/19/2019, 8:34 AM  Contact #  [phone]

## 2019-10-19 NOTE — Progress Notes (Signed)
Physical Therapy Treatment Patient Details Name: Katrina Randolph MRN: [id number] DOB: 04-23-1930 Today's Date: 10/19/2019    History of Present Illness 86 y.o. female now with admit for PNA with a fib RVR, anemia with need for transfusions, HTN, hypothyroidism    PT Comments    Pt had colonoscopy this a.m. Pt agreeable to mobilizing. HR: 116 bpm at rest, 150s-160s bpm with ambulation. Dyspnea 2-3/4 and O2 93% on RA. 1 standing rest break taken after ~35 feet. Made nursing aware of HR with activity. Pt stated she would be more comfortable remaining in hospital at least another night.    Follow Up Recommendations  Home health PT;Supervision - Intermittent;Home Health Aide (if possible)     Equipment Recommendations  None recommended by PT    Recommendations for Other Services       Precautions / Restrictions Precautions Precautions: Fall Precaution Comments: monitor HR    Mobility  Bed Mobility Overal bed mobility: Needs Assistance Bed Mobility: Supine to Sit     Supine to sit: Supervision        Transfers Overall transfer level: Needs assistance Equipment used: None Transfers: Sit to/from Stand Sit to Stand: Min guard            Ambulation/Gait Ambulation/Gait assistance: Min guard Gait Distance (Feet): 75 Feet Assistive device: IV Pole Gait Pattern/deviations: Step-through pattern;Decreased stride length     General Gait Details: Very close guarding. Pt used IV pole for some support. She denied dizziness. HR up to 150s-160s with ambulation. O2 93%, dyspnea 2/4 on RA   Stairs             Wheelchair Mobility    Modified Rankin (Stroke Patients Only)       Balance Overall balance assessment: Needs assistance           Standing balance-Leahy Scale: Fair                              Cognition Arousal/Alertness: Awake/alert Behavior During Therapy: WFL for tasks assessed/performed Overall Cognitive Status: Within  Functional Limits for tasks assessed                                        Exercises      General Comments        Pertinent Vitals/Pain Pain Assessment: No/denies pain    Home Living                      Prior Function            PT Goals (current goals can now be found in the care plan section) Progress towards PT goals: Progressing toward goals    Frequency    Min 3X/week      PT Plan Current plan remains appropriate    Co-evaluation              AM-PAC PT "6 Clicks" Mobility   Outcome Measure  Help needed turning from your back to your side while in a flat bed without using bedrails?: A Little Help needed moving from lying on your back to sitting on the side of a flat bed without using bedrails?: A Little Help needed moving to and from a bed to a chair (including a wheelchair)?: A Little Help needed standing up from a chair using your arms (  e.g., wheelchair or bedside chair)?: A Little Help needed to walk in hospital room?: A Little Help needed climbing 3-5 steps with a railing? : A Little 6 Click Score: 18    End of Session Equipment Utilized During Treatment: Gait belt Activity Tolerance: Patient tolerated treatment well Patient left: in chair;with call bell/phone within reach;with chair alarm set   PT Visit Diagnosis: Unsteadiness on feet (R26.81);Muscle weakness (generalized) (M62.81)     Time: 2241-1464 PT Time Calculation (min) (ACUTE ONLY): 18 min  Charges:  $Gait Training: 8-22 mins                         Doreatha Massed, PT Acute Rehabilitation  Office: [phone] Pager: [phone]

## 2019-10-19 NOTE — Brief Op Note (Signed)
10/12/2019 - 10/19/2019  9:36 AM  PATIENT:  Katrina Randolph  86 y.o. female  PRE-OPERATIVE DIAGNOSIS:  Anemia; Heme positive stool  POST-OPERATIVE DIAGNOSIS:  EGD: hiatal hernia, esophgeal/gastric erosions otherwise normal stomach COLON: Diverticulosis, cecal polyp, ascending colon polyp   PROCEDURE:  Procedure(s): COLONOSCOPY WITH PROPOFOL (N/A) ESOPHAGOGASTRODUODENOSCOPY (EGD) WITH PROPOFOL (N/A) POLYPECTOMY  SURGEON:  Surgeon(s) and Role:    * Theotis Gerdeman, MD - Primary  Findings ---------- -EGD showed large hiatal hernia with Cameron's erosions.  No active bleeding. -Colonoscopy showed few small polyps, diverticulosis and hemorrhoids.  No active bleeding  Recommendations -------------------------- -Start Protonix 40 mg once a day -Advance diet -Monitor H&H -Iron supplements -No further inpatient GI work-up planned.  GI will sign off.  Call us back if needed  [name] MD, [place] 10/19/2019, 9:37 AM  Contact #  [phone]

## 2019-10-19 NOTE — Anesthesia Postprocedure Evaluation (Signed)
Anesthesia Post Note  Patient: Katrina Randolph  Procedure(s) Performed: COLONOSCOPY WITH PROPOFOL (N/A ) ESOPHAGOGASTRODUODENOSCOPY (EGD) WITH PROPOFOL (N/A ) POLYPECTOMY     Patient location during evaluation: PACU Anesthesia Type: MAC Level of consciousness: awake and alert Pain management: pain level controlled Vital Signs Assessment: post-procedure vital signs reviewed and stable Respiratory status: spontaneous breathing, nonlabored ventilation, respiratory function stable and patient connected to nasal cannula oxygen Cardiovascular status: stable and blood pressure returned to baseline Postop Assessment: no apparent nausea or vomiting Anesthetic complications: no   No complications documented.  Last Vitals:  Vitals:   10/19/19 0950 10/19/19 1037  BP: 131/64 130/86  Pulse: 87 72  Resp: 12 14  Temp:  36.4 C  SpO2: 93% 93%    Last Pain:  Vitals:   10/19/19 1037  TempSrc: Axillary  PainSc:                  Effie Berkshire

## 2019-10-19 NOTE — Progress Notes (Addendum)
Status post blood administration ( 15  Min check ) no issues noted - - going through a midline - aware of side effects and no pain- - resting well. Will monitor throughout the process

## 2019-10-19 NOTE — Anesthesia Preprocedure Evaluation (Addendum)
Anesthesia Evaluation  Patient identified by MRN, date of birth, ID band Patient awake    Reviewed: Allergy & Precautions, NPO status , Patient's Chart, lab work & pertinent test results  Airway Mallampati: II  TM Distance: >3 FB Neck ROM: Full    Dental  (+) Teeth Intact, Dental Advisory Given   Pulmonary former smoker,    breath sounds clear to auscultation       Cardiovascular + dysrhythmias Atrial Fibrillation  Rhythm:Irregular Rate:Tachycardia     Neuro/Psych negative neurological ROS  negative psych ROS   GI/Hepatic negative GI ROS, Neg liver ROS,   Endo/Other  negative endocrine ROS  Renal/GU negative Renal ROS  negative genitourinary   Musculoskeletal negative musculoskeletal ROS (+)   Abdominal Normal abdominal exam  (+)   Peds  Hematology negative hematology ROS (+)   Anesthesia Other Findings   Reproductive/Obstetrics                            Anesthesia Physical Anesthesia Plan  ASA: III  Anesthesia Plan: MAC   Post-op Pain Management:    Induction: Intravenous  PONV Risk Score and Plan: 0 and Propofol infusion  Airway Management Planned: Natural Airway and Simple Face Mask  Additional Equipment: None  Intra-op Plan:   Post-operative Plan:   Informed Consent: I have reviewed the patients History and Physical, chart, labs and discussed the procedure including the risks, benefits and alternatives for the proposed anesthesia with the patient or authorized representative who has indicated his/her understanding and acceptance.       Plan Discussed with: CRNA  Anesthesia Plan Comments:       Anesthesia Quick Evaluation

## 2019-10-19 NOTE — Op Note (Signed)
[facility] Patient Name: Katrina Randolph Procedure Date: 10/19/2019 MRN: [id number] Attending MD: [name] , MD Date of Birth: [birth year] CSN: [id number] Age: 86 Admit Type: Outpatient Procedure:                Upper GI endoscopy Indications:              Iron deficiency anemia, Heme positive stool Providers:                [name], MD, Clyde Lundborg, RN, Fransico Setters                            [place], Technician Referring MD:              Medicines:                Sedation Administered by an Anesthesia Professional Complications:            No immediate complications. Estimated Blood Loss:     Estimated blood loss was minimal. Procedure:                Pre-Anesthesia Assessment:                           - Prior to the procedure, a History and Physical                            was performed, and patient medications and                            allergies were reviewed. The patient's tolerance of                            previous anesthesia was also reviewed. The risks                            and benefits of the procedure and the sedation                            options and risks were discussed with the patient.                            All questions were answered, and informed consent                            was obtained. Prior Anticoagulants: The patient has                            taken no previous anticoagulant or antiplatelet                            agents. ASA Grade Assessment: III - A patient with                            severe systemic disease. After reviewing the risks  and benefits, the patient was deemed in                            satisfactory condition to undergo the procedure.                           After obtaining informed consent, the endoscope was                            passed under direct vision. Throughout the                            procedure, the patient's blood pressure,  pulse, and                            oxygen saturations were monitored continuously. The                            GIF-[id number] ([id number]) was introduced through the                            mouth, and advanced to the second part of duodenum.                            The upper GI endoscopy was accomplished without                            difficulty. The patient tolerated the procedure                            well. Scope In: Scope Out: Findings:      A non-obstructing Schatzki ring was found at the gastroesophageal       junction at 32 cm.      A large hiatal hernia with a few Cameron erosions were found.      No gross lesions were noted in the stomach.      The cardia and gastric fundus were normal on retroflexion.      The duodenal bulb, first portion of the duodenum and second portion of       the duodenum were normal. Impression:               - Non-obstructing Schatzki ring.                           - Large hiatal hernia with a few Cameron erosions.                           - No gross lesions in the stomach.                           - Normal duodenal bulb, first portion of the                            duodenum and second portion of the duodenum.                           -  No specimens collected. Moderate Sedation:      Moderate (conscious) sedation was personally administered by an       anesthesia professional. The following parameters were monitored: oxygen       saturation, heart rate, blood pressure, and response to care. Recommendation:           - Use Protonix (pantoprazole) 40 mg PO daily.                           - Perform a colonoscopy today. Procedure Code(s):        --- Professional ---                           [id number], Esophagogastroduodenoscopy, flexible,                            transoral; diagnostic, including collection of                            specimen(s) by brushing or washing, when performed                            (separate  procedure) Diagnosis Code(s):        --- Professional ---                           K22.2, Esophageal obstruction                           K44.9, Diaphragmatic hernia without obstruction or                            gangrene                           K25.9, Gastric ulcer, unspecified as acute or                            chronic, without hemorrhage or perforation                           D50.9, Iron deficiency anemia, unspecified                           R19.5, Other fecal abnormalities CPT copyright 2019 [employer] Medical Association. All rights reserved. The codes documented in this report are preliminary and upon coder review may  be revised to meet current compliance requirements. [name], MD [name], MD 10/19/2019 9:25:38 AM Number of Addenda: 0

## 2019-10-20 DIAGNOSIS — J9601 Acute respiratory failure with hypoxia: Secondary | ICD-10-CM

## 2019-10-20 DIAGNOSIS — E039 Hypothyroidism, unspecified: Secondary | ICD-10-CM

## 2019-10-20 DIAGNOSIS — R42 Dizziness and giddiness: Secondary | ICD-10-CM

## 2019-10-20 DIAGNOSIS — K635 Polyp of colon: Secondary | ICD-10-CM

## 2019-10-20 LAB — CBC
HCT: 29.1 % — ABNORMAL LOW (ref 36.0–46.0)
Hemoglobin: 8.4 g/dL — ABNORMAL LOW (ref 12.0–15.0)
MCH: 23 pg — ABNORMAL LOW (ref 26.0–34.0)
MCHC: 28.9 g/dL — ABNORMAL LOW (ref 30.0–36.0)
MCV: 79.7 fL — ABNORMAL LOW (ref 80.0–100.0)
Platelets: 174 10*3/uL (ref 150–400)
RBC: 3.65 MIL/uL — ABNORMAL LOW (ref 3.87–5.11)
RDW: 19.7 % — ABNORMAL HIGH (ref 11.5–15.5)
WBC: 5.1 10*3/uL (ref 4.0–10.5)
nRBC: 0 % (ref 0.0–0.2)

## 2019-10-20 LAB — TYPE AND SCREEN
ABO/RH(D): A POS
Antibody Screen: NEGATIVE
Unit division: 0

## 2019-10-20 LAB — BPAM RBC
Blood Product Expiration Date: 202110032359
ISSUE DATE / TIME: 202109072120
Unit Type and Rh: 6200

## 2019-10-20 LAB — BASIC METABOLIC PANEL
Anion gap: 10 (ref 5–15)
BUN: 10 mg/dL (ref 8–23)
CO2: 22 mmol/L (ref 22–32)
Calcium: 8.7 mg/dL — ABNORMAL LOW (ref 8.9–10.3)
Chloride: 113 mmol/L — ABNORMAL HIGH (ref 98–111)
Creatinine, Ser: 0.84 mg/dL (ref 0.44–1.00)
GFR calc Af Amer: >60 mL/min (ref >60)
GFR calc non Af Amer: >60 mL/min (ref >60)
Glucose, Bld: 103 mg/dL — ABNORMAL HIGH (ref 70–99)
Potassium: 4.3 mmol/L (ref 3.5–5.1)
Sodium: 145 mmol/L (ref 135–145)

## 2019-10-20 LAB — SURGICAL PATHOLOGY

## 2019-10-20 MED ORDER — METOPROLOL SUCCINATE ER 25 MG PO TB24: 25.0000 mg | ORAL_TABLET | Freq: Every day | ORAL | 3 refills | Status: DC

## 2019-10-20 MED ORDER — METOPROLOL SUCCINATE ER 100 MG PO TB24
100.0000 mg | ORAL_TABLET | Freq: Every day | ORAL | 1 refills | Status: DC
Start: 2019-10-21 — End: 2019-10-20

## 2019-10-20 MED ORDER — METOPROLOL SUCCINATE ER 25 MG PO TB24
25.0000 mg | ORAL_TABLET | Freq: Once | ORAL | Status: AC
  Administered 2019-10-20: 25 mg via ORAL
  Filled 2019-10-20: qty 1

## 2019-10-20 MED ORDER — METOPROLOL SUCCINATE ER 100 MG PO TB24: 100.0000 mg | ORAL_TABLET | Freq: Every day | ORAL | Status: DC

## 2019-10-20 MED ORDER — METOPROLOL SUCCINATE ER 50 MG PO TB24
50.0000 mg | ORAL_TABLET | Freq: Every day | ORAL | 1 refills | Status: DC
Start: 2019-10-21 — End: 2020-06-02

## 2019-10-20 NOTE — Progress Notes (Signed)
WEnt over discharge papers.  All questions answered.  VSS.   Midline removed. Pt wheeled out via NT.

## 2019-10-20 NOTE — Progress Notes (Signed)
Physical Therapy Treatment Patient Details Name: Katrina Randolph MRN: [id number] DOB: [birth year] Today's Date: 10/20/2019    History of Present Illness 86 y.o. female now with admit for PNA with a fib RVR, anemia with need for transfusions, HTN, hypothyroidism    PT Comments    HR fluctuated between 132-159 bpm during ambulation on today. O2 >90%, dyspnea 2-3/4 on RA. Pt walked x 2 on today with seated rest break between walks. Pt reported feeling generally tired and weak. Daughter present during session.   Follow Up Recommendations  Home health PT;Supervision - Intermittent [facility] Aide, if possible)     Equipment Recommendations  None recommended by PT    Recommendations for Other Services       Precautions / Restrictions Precautions Precautions: Fall Precaution Comments: monitor HR Restrictions Weight Bearing Restrictions: No    Mobility  Bed Mobility               General bed mobility comments: pt in recliner on arrival  Transfers Overall transfer level: Needs assistance Equipment used: None Transfers: Sit to/from Stand Sit to Stand: Min guard            Ambulation/Gait Ambulation/Gait assistance: Min guard Gait Distance (Feet): 75 Feet (x2) Assistive device: IV Pole Gait Pattern/deviations: Step-through pattern;Decreased stride length     General Gait Details: Very close guarding. Pt used IV pole for some support. She denied dizziness. Mildly unsteady at times. HR fluctuated between 132-159  with ambulation on today. O2 >90%, dyspnea 2-3/4 on RA   Stairs             Wheelchair Mobility    Modified Rankin (Stroke Patients Only)       Balance Overall balance assessment: Mild deficits observed, not formally tested           Standing balance-Leahy Scale: Fair                              Cognition Arousal/Alertness: Awake/alert Behavior During Therapy: WFL for tasks assessed/performed Overall Cognitive  Status: Within Functional Limits for tasks assessed                                        Exercises      General Comments        Pertinent Vitals/Pain Pain Assessment: No/denies pain    Home Living                      Prior Function            PT Goals (current goals can now be found in the care plan section) Progress towards PT goals: Progressing toward goals    Frequency    Min 3X/week      PT Plan Current plan remains appropriate    Co-evaluation              AM-PAC PT "6 Clicks" Mobility   Outcome Measure  Help needed turning from your back to your side while in a flat bed without using bedrails?: A Little Help needed moving from lying on your back to sitting on the side of a flat bed without using bedrails?: A Little Help needed moving to and from a bed to a chair (including a wheelchair)?: A Little Help needed standing up from a chair using your arms (  e.g., wheelchair or bedside chair)?: A Little Help needed to walk in hospital room?: A Little Help needed climbing 3-5 steps with a railing? : A Little 6 Click Score: 18    End of Session Equipment Utilized During Treatment: Gait belt Activity Tolerance: Patient limited by fatigue (limited by dyspnea, elevated HR) Patient left: in chair;with call bell/phone within reach;with chair alarm set;with family/visitor present   PT Visit Diagnosis: Unsteadiness on feet (R26.81);Muscle weakness (generalized) (M62.81)     Time: 4696-2952 PT Time Calculation (min) (ACUTE ONLY): 19 min  Charges:  $Gait Training: 8-22 mins                        Doreatha Massed, PT Acute Rehabilitation  Office: [phone] Pager: [phone]

## 2019-10-20 NOTE — TOC Progression Note (Signed)
Transition of Care [facility]) - Progression Note    Patient Details  Name: KYNNEDY CARRENO MRN: [id number] Date of Birth: 1931-01-30  [facility] of [facility] CM/SW Contact  Armilda Vanderlinden, Juliann Pulse, RN Phone Number: 10/20/2019, 1:48 PM  Clinical Narrative: [place] following for HHPT.       Expected Discharge Plan: [place] Barriers to Discharge: Continued Medical Work up  Expected Discharge Plan and Services Expected Discharge Plan: [place]   Discharge Planning Services: CM Consult   Living arrangements for the past 2 months: Single Family Home Expected Discharge Date: 10/19/19                         HH Arranged: PT [place]: [place] ([place]) Date [place]: 10/20/19 Time [place]: 1347 Representative spoke with at [place]: [place] ([place]) Interventions    Readmission Risk Interventions No flowsheet data found.

## 2019-10-21 ENCOUNTER — Encounter (HOSPITAL_COMMUNITY): Payer: Self-pay | Admitting: Gastroenterology

## 2019-10-28 ENCOUNTER — Encounter: Payer: Self-pay | Admitting: General Practice

## 2019-10-28 ENCOUNTER — Ambulatory Visit: Payer: Medicare Other | Admitting: General Practice

## 2019-10-28 ENCOUNTER — Other Ambulatory Visit: Payer: Self-pay

## 2019-10-28 VITALS — BP 119/71 | HR 81 | Ht 62.0 in | Wt 169.0 lb

## 2019-10-28 DIAGNOSIS — I10 Essential (primary) hypertension: Secondary | ICD-10-CM | POA: Diagnosis not present

## 2019-10-28 DIAGNOSIS — I482 Chronic atrial fibrillation, unspecified: Secondary | ICD-10-CM

## 2019-10-28 DIAGNOSIS — R42 Dizziness and giddiness: Secondary | ICD-10-CM

## 2019-10-28 DIAGNOSIS — Z79899 Other long term (current) drug therapy: Secondary | ICD-10-CM

## 2019-10-28 NOTE — Telephone Encounter (Signed)
Patient contacted regarding discharge from [facility] on 10/20/19.  Patient understands to follow up with provider Coletta Memos on 10/28/19 at 11:15 am  at [facility]. Patient understands discharge instructions? yes Patient understands medications and regiment? yes Patient understands to bring all medications to this visit? yes

## 2019-10-28 NOTE — Progress Notes (Signed)
Cardiology Clinic Note   Patient Name: Katrina Randolph Date of Encounter: 10/28/2019  Primary Care Provider:  [name], PA-C Primary Cardiologist:  Katrina Breeding, MD  Patient Profile    Katrina Randolph 86 year old female presents the clinic today for an evaluation of her atrial fibrillation.  Past Medical History    Past Medical History:  Diagnosis Date  . Atrial fibrillation [facility])    Past Surgical History:  Procedure Laterality Date  . ABDOMINAL HYSTERECTOMY    . BIOPSY  10/19/2019   Procedure: BIOPSY;  Surgeon: Katrina Brace, MD;  Location: [facility];  Service: Gastroenterology;;  . BREAST LUMPECTOMY    . CHOLECYSTECTOMY    . COLONOSCOPY WITH PROPOFOL N/A 10/19/2019   Procedure: COLONOSCOPY WITH PROPOFOL;  Surgeon: Katrina Brace, MD;  Location: [facility];  Service: Gastroenterology;  Laterality: N/A;  . ESOPHAGOGASTRODUODENOSCOPY (EGD) WITH PROPOFOL N/A 10/19/2019   Procedure: ESOPHAGOGASTRODUODENOSCOPY (EGD) WITH PROPOFOL;  Surgeon: Katrina Brace, MD;  Location: [facility];  Service: Gastroenterology;  Laterality: N/A;  . POLYPECTOMY  10/19/2019   Procedure: POLYPECTOMY;  Surgeon: Katrina Brace, MD;  Location: [facility];  Service: Gastroenterology;;    Allergies  No Known Allergies  History of Present Illness    Katrina Randolph has a past medical history of chronic atrial fibrillation for the last 10 years.  She is not on anticoagulation due to anemia that has required multiple transfusions.  There is been no clear source of her bleeding.  She also has hypothyroidism, shortness of breath, chest discomfort, acute respiratory failure, hypothyroidism, and orthostatic dizziness.  She was admitted to the hospital 10/12/2019-10/20/2019 with rapid atrial fibrillation, pneumonia, anemia, acute thrombocytopenia, and orthostatic dizziness.  She had presented to her PCP and was found to have a tachycardic rhythm with a heart rate greater than 160 bpm.   She was referred to the emergency department for further evaluation.  She was noted to have a headache chronic cough which she attributes to lisinopril.  She was admitted to [facility] for further management, cardiology was consulted.  She reported that she had felt dizzy for around 2 weeks.  She was noted to be orthostatic and her cortisol was found to be low.  Her hemoglobin had trended down to 7.7, nursing had reported dark stools.  She was placed on oxygen due to an O2 saturation of 89 on room air and with exertion.  She was able to be weaned off her oxygen on 9 5 although she still reported exertional dyspnea.  Her heart rate became controlled and cardiology signed off.  Colonoscopy was planned.  Her lisinopril was changed to losartan.  She presents to the clinic today for follow-up evaluation and states she feels well.  She underwent endoscopy and colonoscopy and was found to have a hiatal hernia and active bleed which was clipped.  She states that she has been increasing her physical activity.  Her daughter has been monitoring her heart rate blood pressure which have been fairly well controlled.  She has not had a repeat CBC since being discharged from the hospital and does not have any follow-up appointment scheduled with her PCP.  I will continue her current medications obtain a CBC and have her follow-up with Katrina Randolph in 3 months.  Today she denies chest pain, shortness of breath, lower extremity edema, fatigue, palpitations, melena, hematuria, hemoptysis, diaphoresis, weakness, presyncope, syncope, orthopnea, and PND.      Home Medications    Prior to Admission medications   Medication Sig  Start Date End Date Taking? Authorizing Provider  feeding supplement, ENSURE ENLIVE, (ENSURE ENLIVE) LIQD Take 237 mLs by mouth 2 (two) times daily between meals. 10/19/19   Katrina Shi, MD  ferrous sulfate 325 (65 FE) MG tablet Take 1 tablet (325 mg total) by mouth 2 (two) times daily with a  meal. 10/19/19   Katrina Shi, MD  furosemide (LASIX) 20 MG tablet Take 20 mg by mouth daily as needed for fluid.  10/03/19   [provider]  levothyroxine (SYNTHROID) 112 MCG tablet Take 1 tablet (112 mcg total) by mouth every morning. 10/19/19   Katrina Shi, MD  metoprolol succinate (TOPROL XL) 25 MG 24 hr tablet Take 1-2 tablets (25-50 mg total) by mouth daily. 10/20/19 02/17/20  Katrina Shi, MD  metoprolol succinate (TOPROL-XL) 50 MG 24 hr tablet Take 1 tablet (50 mg total) by mouth daily. Take with or immediately following a meal. 10/21/19   Katrina Shi, MD  pantoprazole (PROTONIX) 40 MG tablet Take 1 tablet (40 mg total) by mouth daily. 10/19/19   Katrina Shi, MD  senna-docusate (SENOKOT-S) 8.6-50 MG tablet Take 1 tablet by mouth 2 (two) times daily. 10/19/19   Katrina Shi, MD  sertraline (ZOLOFT) 50 MG tablet Take 50 mg by mouth daily. 08/16/19   [provider]  traMADol (ULTRAM) 50 MG tablet Take 50 mg by mouth every 6 (six) hours as needed for moderate pain.  09/17/19   [provider]  traZODone (DESYREL) 50 MG tablet Take 50 mg by mouth at bedtime as needed for sleep.  09/06/19   [provider]    Family History    History reviewed. No pertinent family history. She indicated that her mother is deceased. She indicated that her father is deceased.  Social History    Social History   Socioeconomic History  . Marital status: Married    Spouse name: Not on file  . Number of children: Not on file  . Years of education: Not on file  . Highest education level: Not on file  Occupational History  . Not on file  Tobacco Use  . Smoking status: Former Research scientist (life sciences)  . Smokeless tobacco: Never Used  Vaping Use  . Vaping Use: Never used  Substance and Sexual Activity  . Alcohol use: Yes  . Drug use: Never  . Sexual activity: Not on file  Other Topics Concern  . Not on file  Social History Narrative  . Not on file   Social  Determinants of Health   Financial Resource Strain:   . Difficulty of Paying Living Expenses: Not on file  Food Insecurity:   . Worried About Charity fundraiser in the Last Year: Not on file  . Ran Out of Food in the Last Year: Not on file  Transportation Needs:   . Lack of Transportation (Medical): Not on file  . Lack of Transportation (Non-Medical): Not on file  Physical Activity:   . Days of Exercise per Week: Not on file  . Minutes of Exercise per Session: Not on file  Stress:   . Feeling of Stress : Not on file  Social Connections:   . Frequency of Communication with Friends and Family: Not on file  . Frequency of Social Gatherings with Friends and Family: Not on file  . Attends Religious Services: Not on file  . Active Member of Clubs or Organizations: Not on file  . Attends Archivist Meetings: Not on file  . Marital Status: Not on  file  Intimate Partner Violence:   . Fear of Current or Ex-Partner: Not on file  . Emotionally Abused: Not on file  . Physically Abused: Not on file  . Sexually Abused: Not on file     Review of Systems    General:  No chills, fever, night sweats or weight changes.  Cardiovascular:  No chest pain, dyspnea on exertion, edema, orthopnea, palpitations, paroxysmal nocturnal dyspnea. Dermatological: No rash, lesions/masses Respiratory: No cough, dyspnea Urologic: No hematuria, dysuria Abdominal:   No nausea, vomiting, diarrhea, bright red blood per rectum, melena, or hematemesis Neurologic:  No visual changes, wkns, changes in mental status. All other systems reviewed and are otherwise negative except as noted above.  Physical Exam    VS:  BP 119/71   Pulse 81   Ht 5\' 2"  (1.575 m)   Wt 169 lb (76.7 kg)   SpO2 97%   BMI 30.91 kg/m  , BMI Body mass index is 30.91 kg/m. GEN: Well nourished, well developed, in no acute distress. HEENT: normal. Neck: Supple, no JVD, carotid bruits, or masses. Cardiac: RRR, no murmurs, rubs, or  gallops. No clubbing, cyanosis, edema.  Radials/DP/PT 2+ and equal bilaterally.  Respiratory:  Respirations regular and unlabored, clear to auscultation bilaterally. GI: Soft, nontender, nondistended, BS + x 4. MS: no deformity or atrophy. Skin: warm and dry, no rash. Neuro:  Strength and sensation are intact. Psych: Normal affect.  Accessory Clinical Findings    Recent Labs: 10/12/2019: B Natriuretic Peptide 344.5 10/13/2019: ALT 13 10/16/2019: Magnesium 1.7 10/18/2019: TSH 6.806 10/20/2019: BUN 10; Creatinine, Ser 0.84; Hemoglobin 8.4; Platelets 174; Potassium 4.3; Sodium 145   Recent Lipid Panel No results found for: CHOL, TRIG, HDL, CHOLHDL, VLDL, LDLCALC, LDLDIRECT  ECG personally reviewed by me today-atrial fibrillation nonspecific T wave abnormality 81 bpm- No acute changes  Echocardiogram 10/13/2019 IMPRESSIONS    1. Left ventricular ejection fraction, by estimation, is 40 to 45%. The  left ventricle has mildly decreased function. The left ventricle  demonstrates global hypokinesis. Left ventricular diastolic function could  not be evaluated.  2. Right ventricular systolic function is normal. The right ventricular  size is normal. There is moderately elevated pulmonary artery systolic  pressure.  3. Left atrial size was severely dilated.  4. Right atrial size was severely dilated.  5. The mitral valve is normal in structure. Mild mitral valve  regurgitation. No evidence of mitral stenosis.  6. The aortic valve is normal in structure. Aortic valve regurgitation is  not visualized. No aortic stenosis is present.  7. The inferior vena cava is dilated in size with <50% respiratory  variability, suggesting right atrial pressure of 15 mmHg.   Assessment & Plan   1.  Atrial fibrillation with RVR-heart rate today 81 bpm.  Not a candidate for anticoagulation due to anemia and multiple transfusions without clear source.  Echocardiogram showed EF of 40-45% with severe biatrial  enlargement.  Received Cardizem GTT in the hospital which was transitioned to metoprolol 100 mg. Continue metoprolol Heart healthy low-sodium diet-salty 6 given Increase physical activity as tolerated  Essential hypertension-BP today 119/71.  Well-controlled.  Chronic cough with lisinopril.  During recent hospitalization she was switched to losartan but held due to orthostatic hypotension. Continue metoprolol, losartan  Heart healthy low-sodium diet-salty 6 given Increase physical activity as tolerated  Orthostatic hypotension-no recurrent episodes of dizziness.  Initially had low cortisol level on 9/5.  Repeat 9/X showed adequate cortisol level at 19 and appropriate response to stim test.  Recommendation to avoid vasodilators. Lower extremity support stockings Increase p.o. hydration Continue to avoid exertion  Obesity-weight today 169.1 pounds. Heart healthy low-sodium diet Increase physical activity as tolerated Weight loss encouraged  Disposition: Follow-up with Katrina Randolph in 3 months.  Jossie Ng. Jameya Pontiff NP-C    10/28/2019, 12:26 PM [address] Office [phone] Fax [phone]  Notice: This dictation was prepared with Dragon dictation along with smaller phrase technology. Any transcriptional errors that result from this process are unintentional and may not be corrected upon review.

## 2019-10-28 NOTE — Patient Instructions (Signed)
Medication Instructions:  The current medical regimen is effective;  continue present plan and medications as directed. Please refer to the Current Medication list given to you today. *If you need a refill on your cardiac medications before your next appointment, please call your pharmacy*  Lab Work: CBC TODAY If you have labs (blood work) drawn today and your tests are completely normal, you will receive your results only by:  [facility] (if you have MyChart) OR A paper copy in the mail.  If you have any lab test that is abnormal or we need to change your treatment, we will call you to review the results. You may go to any [facility] that is convenient for you however, we do have a lab in our office that is able to assist you. You DO NOT need an appointment for our lab. The lab is open 8:00am and closes at 4:00pm. Lunch 12:45 - 1:45pm.  Testing/Procedures: NONE  Special Instructions PLEASE READ AND FOLLOW SALTY 6-ATTACHED  Follow-Up: Your next appointment:  3 month(s) In Person with [name], MD -[place], FNP-C   At [facility], you and your health needs are our priority.  As part of our continuing mission to provide you with exceptional heart care, we have created designated Provider Care Teams.  These Care Teams include your primary Cardiologist (physician) and Advanced Practice Providers (APPs -  Physician Assistants and Nurse Practitioners) who all work together to provide you with the care you need, when you need it.

## 2019-10-29 LAB — CBC
Hematocrit: 36.5 % (ref 34.0–46.6)
Hemoglobin: 10.7 g/dL — ABNORMAL LOW (ref 11.1–15.9)
MCH: 23.3 pg — ABNORMAL LOW (ref 26.6–33.0)
MCHC: 29.3 g/dL — ABNORMAL LOW (ref 31.5–35.7)
MCV: 80 fL (ref 79–97)
Platelets: 267 10*3/uL (ref 150–450)
RBC: 4.59 x10E6/uL (ref 3.77–5.28)
RDW: 21.3 % — ABNORMAL HIGH (ref 11.7–15.4)
WBC: 8 10*3/uL (ref 3.4–10.8)

## 2019-12-13 ENCOUNTER — Encounter: Payer: Self-pay | Admitting: General Practice

## 2020-01-26 DIAGNOSIS — I951 Orthostatic hypotension: Secondary | ICD-10-CM | POA: Insufficient documentation

## 2020-01-26 DIAGNOSIS — I10 Essential (primary) hypertension: Secondary | ICD-10-CM | POA: Insufficient documentation

## 2020-01-26 NOTE — Progress Notes (Signed)
Cardiology Office Note   Date:  01/28/2020   ID:  Raschelle, Wisenbaker 1930-12-17, MRN [id number]  PCP:  [name], PA-C  Cardiologist:   [name], MD   Chief Complaint  Patient presents with  . Dizziness      History of Present Illness: Katrina Randolph is a 86 y.o. female who for follow up of chronic atrial fibrillation for the last 10 years.  She is not on anticoagulation due to anemia that has required multiple transfusions.  There is been no clear source of her bleeding.  She was admitted to the hospital 10/12/2019-10/20/2019 with rapid atrial fibrillation, pneumonia, anemia, acute thrombocytopenia, and orthostatic dizziness.  She had presented to her PCP and was found to have a tachycardic rhythm with a heart rate greater than 160 bpm.  We were involved to help with heart rate control.  She had active bleeding and GI evaluation with colonoscopy and EGD with no clear source identified.  She has a hiatal hernia and Cameron's erosions.  She has some small polyps and diverticulosis as well as hemorrhoids.  She has had orthostatic hypotension.   Of note when she was in the hospital she had significant orthostatic hypotension and Cozaar was stopped.  There was some confusion about this and we did call to verify that she is not getting this from her pharmacy.  Since she went home she says she is doing relatively well. She denies any cardiovascular symptoms. She does get dizzy if she jumps up quickly. She moves slowly. She has not had any presyncope or syncope. She has had no chest pressure, neck or arm discomfort.    Past Medical History:  Diagnosis Date  . Atrial fibrillation [facility])     Past Surgical History:  Procedure Laterality Date  . ABDOMINAL HYSTERECTOMY    . BIOPSY  10/19/2019   Procedure: BIOPSY;  Surgeon: [name], MD;  Location: [facility];  Service: Gastroenterology;;  . BREAST LUMPECTOMY    . CHOLECYSTECTOMY    . COLONOSCOPY WITH  PROPOFOL N/A 10/19/2019   Procedure: COLONOSCOPY WITH PROPOFOL;  Surgeon: [name], MD;  Location: [facility];  Service: Gastroenterology;  Laterality: N/A;  . ESOPHAGOGASTRODUODENOSCOPY (EGD) WITH PROPOFOL N/A 10/19/2019   Procedure: ESOPHAGOGASTRODUODENOSCOPY (EGD) WITH PROPOFOL;  Surgeon: [name], MD;  Location: [facility];  Service: Gastroenterology;  Laterality: N/A;  . POLYPECTOMY  10/19/2019   Procedure: POLYPECTOMY;  Surgeon: [name], MD;  Location: [facility];  Service: Gastroenterology;;     Current Outpatient Medications  Medication Sig Dispense Refill  . feeding supplement, ENSURE ENLIVE, (ENSURE ENLIVE) LIQD Take 237 mLs by mouth 2 (two) times daily between meals. 237 mL 12  . ferrous sulfate 325 (65 FE) MG tablet Take 1 tablet (325 mg total) by mouth 2 (two) times daily with a meal. 30 tablet 3  . furosemide (LASIX) 20 MG tablet Take 20 mg by mouth daily. PRN  1  . furosemide (LASIX) 20 MG tablet Take 20 mg by mouth daily as needed for fluid.     Marland Kitchen levothyroxine (SYNTHROID) 112 MCG tablet Take 1 tablet (112 mcg total) by mouth every morning. 30 tablet 0  . metoprolol succinate (TOPROL XL) 25 MG 24 hr tablet Take 1-2 tablets (25-50 mg total) by mouth daily. 60 tablet 3  . metoprolol succinate (TOPROL-XL) 50 MG 24 hr tablet Take 1 tablet (50 mg total) by mouth daily. Take with or immediately following a meal. 30 tablet 1  . omeprazole (PRILOSEC)  20 MG capsule Take by mouth daily.  3  . pantoprazole (PROTONIX) 40 MG tablet Take 1 tablet (40 mg total) by mouth daily. 30 tablet 2  . senna-docusate (SENOKOT-S) 8.6-50 MG tablet Take 1 tablet by mouth 2 (two) times daily. 60 tablet 1  . sertraline (ZOLOFT) 50 MG tablet Take 50 mg by mouth daily.    . traZODone (DESYREL) 50 MG tablet Take 50 mg by mouth at bedtime as needed for sleep.      No current facility-administered medications for this visit.    Allergies:   Sulfa antibiotics    ROS:  Please see  the history of present illness.   Otherwise, review of systems are positive for none.   All other systems are reviewed and negative.    PHYSICAL EXAM: VS:  BP 130/73   Pulse 82   Temp (!) 97.2 F (36.2 C)   Ht 5\' 2"  (1.575 m)   Wt 165 lb 9.6 oz (75.1 kg)   SpO2 95%   BMI 30.29 kg/m  , BMI Body mass index is 30.29 kg/m. GENERAL:  Well appearing NECK:  No jugular venous distention, waveform within normal limits, carotid upstroke brisk and symmetric, no bruits, no thyromegaly LYMPHATICS:  No cervical, inguinal adenopathy LUNGS:  Clear to auscultation bilaterally CHEST:  Unremarkable HEART:  PMI not displaced or sustained,S1 and S2 within normal limits, no S3, no clicks, no rubs, no murmurs ABD:  Flat, positive bowel sounds normal in frequency in pitch, no bruits, no rebound, no guarding, no midline pulsatile mass, no hepatomegaly, no splenomegaly EXT:  2 plus pulses throughout, no edema, no cyanosis no clubbing SKIN:  No rashes no nodules   EKG:  EKG is not ordered today.    Recent Labs: 10/12/2019: B Natriuretic Peptide 344.5 10/13/2019: ALT 13 10/16/2019: Magnesium 1.7 01/27/2020: BUN 16; Creatinine, Ser 0.93; Hemoglobin 13.4; Platelets 213; Potassium 5.0; Sodium WILL FOLLOW; TSH 2.000    Lipid Panel No results found for: CHOL, TRIG, HDL, CHOLHDL, VLDL, LDLCALC, LDLDIRECT    Wt Readings from Last 3 Encounters:  01/27/20 165 lb 9.6 oz (75.1 kg)  10/28/19 169 lb (76.7 kg)  10/13/19 180 lb 12.4 oz (82 kg)      Other studies Reviewed: Additional studies/ records that were reviewed today include: Hospital records. Review of the above records demonstrates:  Please see elsewhere in the note.     ASSESSMENT AND PLAN:   Atrial fibrillation with RVR:   I am going to send her to see [name].  She does seem to be intolerant of anticoagulation.  I reviewed the hospital records and make that she will not become a candidate and so I would like her to discuss possible  [facility].   She is agreeable to hearing this.  Her husband has had a stroke and she would like very much to avoid this.   I will check a CBC.  Essential hypertension: Her blood pressure is okay today.  I do think she would tolerate med titration so I will leave her on the meds as listed.  Orthostatic hypotension:   No change in therapy as above.  We talked about avoiding sudden change in positions.   Hypothyroidism: I will check a TSH.  Current medicines are reviewed at length with the patient today.  The patient does not have concerns regarding medicines.  The following changes have been made:  no change  Labs/ tests ordered today include:   Orders Placed This Encounter  Procedures  .  TSH  . CBC  . Basic metabolic panel  . Ambulatory referral to Cardiac Electrophysiology     Disposition:   FU with me after seeing Dr. Lysbeth Galas.     Signed, [name], MD  01/28/2020 11:07 AM    [place]  ]

## 2020-01-27 ENCOUNTER — Other Ambulatory Visit: Payer: Self-pay

## 2020-01-27 ENCOUNTER — Encounter: Payer: Self-pay | Admitting: Cardiology

## 2020-01-27 ENCOUNTER — Ambulatory Visit: Payer: Medicare Other | Admitting: Cardiology

## 2020-01-27 VITALS — BP 130/73 | HR 82 | Temp 97.2°F | Ht 62.0 in | Wt 165.6 lb

## 2020-01-27 DIAGNOSIS — I10 Essential (primary) hypertension: Secondary | ICD-10-CM

## 2020-01-27 DIAGNOSIS — I4891 Unspecified atrial fibrillation: Secondary | ICD-10-CM | POA: Diagnosis not present

## 2020-01-27 DIAGNOSIS — I951 Orthostatic hypotension: Secondary | ICD-10-CM | POA: Diagnosis not present

## 2020-01-27 DIAGNOSIS — E039 Hypothyroidism, unspecified: Secondary | ICD-10-CM | POA: Diagnosis not present

## 2020-01-27 NOTE — Patient Instructions (Addendum)
Medication Instructions:   Stop Losartan *If you need a refill on your cardiac medications before your next appointment, please call your pharmacy*  Lab Work: Your physician recommends that you return for lab work today (TSH, CBC, BMP) If you have labs (blood work) drawn today and your tests are completely normal, you will receive your results only by:  [place] (if you have MyChart) OR  A paper copy in the mail If you have any lab test that is abnormal or we need to change your treatment, we will call you to review the results.  Testing/Procedures: None ordered this visit  Follow-Up: At [facility], you and your health needs are our priority.  As part of our continuing mission to provide you with exceptional heart care, we have created designated Provider Care Teams.  These Care Teams include your primary Cardiologist (physician) and Advanced Practice Providers (APPs -  Physician Assistants and Nurse Practitioners) who all work together to provide you with the care you need, when you need it.    Your next appointment:   6 month(s)  You will receive a reminder letter in the mail two months in advance. If you don't receive a letter, please call our office to schedule the follow-up appointment.  The format for your next appointment:   In Person  Provider:   [name], MD  Other Instructions: Referral made to [name]

## 2020-01-28 ENCOUNTER — Encounter: Payer: Self-pay | Admitting: Cardiology

## 2020-01-29 LAB — BASIC METABOLIC PANEL
BUN/Creatinine Ratio: 17 (ref 12–28)
BUN: 16 mg/dL (ref 8–27)
CO2: 22 mmol/L (ref 20–29)
Calcium: 9.5 mg/dL (ref 8.7–10.3)
Chloride: 103 mmol/L (ref 96–106)
Creatinine, Ser: 0.93 mg/dL (ref 0.57–1.00)
GFR calc Af Amer: 64 mL/min/{1.73_m2} (ref >59)
GFR calc non Af Amer: 55 mL/min/{1.73_m2} — ABNORMAL LOW (ref >59)
Glucose: 98 mg/dL (ref 65–99)
Potassium: 5 mmol/L (ref 3.5–5.2)
Sodium: 143 mmol/L (ref 134–144)

## 2020-01-29 LAB — CBC
Hematocrit: 42.5 % (ref 34.0–46.6)
Hemoglobin: 13.4 g/dL (ref 11.1–15.9)
MCH: 27.1 pg (ref 26.6–33.0)
MCHC: 31.5 g/dL (ref 31.5–35.7)
MCV: 86 fL (ref 79–97)
Platelets: 213 10*3/uL (ref 150–450)
RBC: 4.95 x10E6/uL (ref 3.77–5.28)
RDW: 14.9 % (ref 11.7–15.4)
WBC: 7.5 10*3/uL (ref 3.4–10.8)

## 2020-01-29 LAB — TSH: TSH: 2 u[IU]/mL (ref 0.450–4.500)

## 2020-01-31 ENCOUNTER — Telehealth: Payer: Self-pay | Admitting: Cardiology

## 2020-01-31 NOTE — Telephone Encounter (Signed)
Results reviewed with patient

## 2020-01-31 NOTE — Telephone Encounter (Signed)
Patient returning a call to discuss lab results. Please call back

## 2020-02-16 ENCOUNTER — Ambulatory Visit: Payer: Medicare Other | Admitting: Cardiology

## 2020-02-16 ENCOUNTER — Other Ambulatory Visit: Payer: Self-pay

## 2020-02-16 ENCOUNTER — Encounter: Payer: Self-pay | Admitting: Cardiology

## 2020-02-16 VITALS — BP 122/72 | HR 81 | Ht 62.0 in | Wt 168.6 lb

## 2020-02-16 DIAGNOSIS — I480 Paroxysmal atrial fibrillation: Secondary | ICD-10-CM | POA: Diagnosis not present

## 2020-02-16 DIAGNOSIS — D649 Anemia, unspecified: Secondary | ICD-10-CM | POA: Diagnosis not present

## 2020-02-16 NOTE — Progress Notes (Signed)
Watchman Consult Note   Date:  02/16/2020   ID:  Charm, Brother 28-Jun-1930, MRN [id number]  PCP:  [name], PA-C  Primary Electrophysiologist: Lars Mage, MD Referring Physician: [name]   Chief complaint: atrial fibrillation and coagulation management; Watchman implant    History of Present Illness: Katrina Randolph is a 87 y.o. female referred by Dr Percival Spanish for evaluation of atrial fibrillation and stroke prevention. She has permanent atrial fibrillation as well as chronic anemia requiring transfusions.  The patient has been evaluated by their referring physician and is felt to be a poor candidate for long term [place] due to chronic anemia.  She therefore presents today for Watchman evaluation.   Today, she denies symptoms of palpitations, chest pain, shortness of breath, orthopnea, PND, lower extremity edema, claudication, dizziness, presyncope, syncope, bleeding, or neurologic sequela. The patient is tolerating medications without difficulties and is otherwise without complaint today.   She presented to the clinic today with her daughter who is actively involved in her health care.  She tells me that she has been hospitalized multiple times for her symptomatic anemia.  She was admitted most recently in September 2021.  GI was consulted during the hospitalization who performed a colonoscopy and EGD.  There was no clear source of the bleeding.  She had polyps, diverticulosis, a hiatal hernia and Katrina Randolph erosions.  Her course has also been complicated by orthostatic hypotension but thankfully she has not had any syncopal episodes.  From talking to the patient and her daughter during today's visit, there have been a lot of stressors in her life with her husband's passing.  Her husband had a history of a significant stroke.  According to the patient and her daughter, the patient is very active.  Past Medical History:  Diagnosis Date  . Atrial fibrillation  [facility])    Past Surgical History:  Procedure Laterality Date  . ABDOMINAL HYSTERECTOMY    . BIOPSY  10/19/2019   Procedure: BIOPSY;  Surgeon: [name], MD;  Location: [facility];  Service: Gastroenterology;;  . BREAST LUMPECTOMY    . CHOLECYSTECTOMY    . COLONOSCOPY WITH PROPOFOL N/A 10/19/2019   Procedure: COLONOSCOPY WITH PROPOFOL;  Surgeon: [name], MD;  Location: [facility];  Service: Gastroenterology;  Laterality: N/A;  . ESOPHAGOGASTRODUODENOSCOPY (EGD) WITH PROPOFOL N/A 10/19/2019   Procedure: ESOPHAGOGASTRODUODENOSCOPY (EGD) WITH PROPOFOL;  Surgeon: [name], MD;  Location: [facility];  Service: Gastroenterology;  Laterality: N/A;  . POLYPECTOMY  10/19/2019   Procedure: POLYPECTOMY;  Surgeon: [name], MD;  Location: [facility];  Service: Gastroenterology;;     Current Outpatient Medications  Medication Sig Dispense Refill  . esomeprazole (NEXIUM) 20 MG capsule Take 20 mg by mouth as needed.    . furosemide (LASIX) 20 MG tablet Take 20 mg by mouth daily as needed for fluid.     Marland Kitchen levothyroxine (SYNTHROID) 112 MCG tablet Take 1 tablet (112 mcg total) by mouth every morning. 30 tablet 0  . metoprolol succinate (TOPROL-XL) 50 MG 24 hr tablet Take 1 tablet (50 mg total) by mouth daily. Take with or immediately following a meal. 30 tablet 1  . sertraline (ZOLOFT) 50 MG tablet Take 50 mg by mouth daily.    . traZODone (DESYREL) 50 MG tablet Take 50 mg by mouth at bedtime as needed for sleep.      No current facility-administered medications for this visit.    Allergies:   Sulfa antibiotics   Social History:  The patient  reports that she has quit smoking. Her smoking use included cigarettes. She has never used smokeless tobacco. She reports current alcohol use. She reports that she does not use drugs.   Family History:  The patient's  family history is not on file.    ROS:  Please see the history of present illness.   All other systems are  reviewed and negative.    PHYSICAL EXAM: VS:  BP 122/72   Pulse 81   Ht 5\' 2"  (1.575 m)   Wt 168 lb 9.6 oz (76.5 kg)   SpO2 96%   BMI 30.84 kg/m  , BMI Body mass index is 30.84 kg/m.   GEN: Well nourished, well developed, in no acute distress.  Appears younger than stated age 53: normal  Neck: no JVD, carotid bruits, or masses Cardiac: Irregularly irregular; no murmurs, rubs, or gallops,no edema  Respiratory:  clear to auscultation bilaterally, normal work of breathing GI: soft, nontender, nondistended, + BS MS: no deformity or atrophy  Skin: warm and dry  Neuro:  Strength and sensation are intact Psych: euthymic mood, full affect  EKG:  EKG is ordered today. The ekg ordered today shows atrial fibrillation   Recent Labs: 10/12/2019: B Natriuretic Peptide 344.5 10/13/2019: ALT 13 10/16/2019: Magnesium 1.7 01/27/2020: BUN 16; Creatinine, Ser 0.93; Hemoglobin 13.4; Platelets 213; Potassium 5.0; Sodium 143; TSH 2.000    Lipid Panel  No results found for: CHOL, TRIG, HDL, CHOLHDL, VLDL, LDLCALC, LDLDIRECT   Wt Readings from Last 3 Encounters:  02/16/20 168 lb 9.6 oz (76.5 kg)  01/27/20 165 lb 9.6 oz (75.1 kg)  10/28/19 169 lb (76.7 kg)      Other studies Reviewed: Prior hospitalization records  October 13, 2019 echo personally reviewed Left ventricular function mildly decreased, 45% Right ventricular function normal Severe left and right atrial dilation  ASSESSMENT AND PLAN:  1.  Permanent atrial fibrillation I have seen Katrina Randolph in the office today who is being considered for a Watchman left atrial appendage closure device. I believe they will benefit from this procedure given their history of atrial fibrillation, CHA2DS2-VASc score of 3 and unadjusted ischemic stroke rate of 3.2% per year. Unfortunately, the patient is not felt to be a long term anticoagulation candidate secondary to recurrent severe anemia requiring transfusions. The patient's chart  has been reviewed and I feel that they would be a candidate for short term oral anticoagulation after Watchman implant.   It is my belief that after undergoing a LAA closure procedure, Katrina Randolph will not need long term anticoagulation which eliminates anticoagulation side effects and major bleeding risk.   Procedural risks for the Watchman implant have been reviewed with the patient including a 0.5% risk of stroke, <1% risk of perforation and <1% risk of device embolization.   Given the etiology of her anemia is thought to be GI related, my postprocedural anticoagulation strategy would be half dose Eliquis.  I would avoid the aspirin given her history of GI bleeds.  The published clinical data on the safety and effectiveness of WATCHMAN include but are not limited to the following: - Holmes DR, Mechele Claude, Sick P et al. for the PROTECT AF Investigators. Percutaneous closure of the left atrial appendage versus warfarin therapy for prevention of stroke in patients with atrial fibrillation: a randomised non-inferiority trial. Lancet 2009; 374: 534-42. Mechele Claude, Doshi SK, Abelardo Diesel D et al. on behalf of the PROTECT AF Investigators. Percutaneous Left Atrial Appendage Closure for Stroke  Prophylaxis in Patients With Atrial Fibrillation 2.3-Year Follow-up of the PROTECT AF (Watchman Left Atrial Appendage System for Embolic Protection in Patients With Atrial Fibrillation) Trial. Circulation 2013; 127:720-729. - Alli O, Doshi S,  Kar S, Reddy VY, Sievert H et al. Quality of Life Assessment in the Randomized PROTECT AF (Percutaneous Closure of the Left Atrial Appendage Versus Warfarin Therapy for Prevention of Stroke in Patients With Atrial Fibrillation) Trial of Patients at Risk for Stroke With Nonvalvular Atrial Fibrillation. J Am Coll Cardiol 2013; 61:1790-8. Aline August DR, [place]ia [place], Price M, Whisenant B, Sievert H, Doshi S, Huber K, Reddy V. Prospective randomized evaluation of the Watchman left  atrial appendage Device in patients with atrial fibrillation versus long-term warfarin therapy; the PREVAIL trial. Journal of the [employer] of Cardiology, Vol. 4, No. 1, 2014, 1-11. - Kar S, Doshi SK, Sadhu A, Horton R, Osorio J et al. Primary outcome evaluation of a next-generation left atrial appendage closure device: results from the PINNACLE FLX trial. Circulation 2021;143(18)1754-1762.    We discussed the procedure, its risks, expected recovery at length during today's visit.  After our discussion, the patient and her daughter wanted some time to think through things before making a final decision.  I think this is very reasonable.  I gave her a way to reach the clinic if she would like to proceed.  If she elects to proceed with watchman implant, we would need to get a CT scan of the chest for assessment of left atrial appendage anatomy.   Signed, Rossie Muskrat. Lalla Brothers, MD, [facility] Cardiac Electrophysiology  [facility] [address] [place] [address] [phone] (office) [phone] (fax)

## 2020-02-16 NOTE — Patient Instructions (Signed)
Medication Instructions:  Your physician recommends that you continue on your current medications as directed. Please refer to the Current Medication list given to you today.  Labwork: None ordered.  Testing/Procedures: None ordered.  Follow-Up: Your physician wants you to follow-up in: as needed with Dr. Lambert.     Any Other Special Instructions Will Be Listed Below (If Applicable).  If you need a refill on your cardiac medications before your next appointment, please call your pharmacy.    Left Atrial Appendage Closure Device Implantation  Left atrial appendage (LAA) closure device implantation is a procedure that is done to place a small device in the LAA of the heart. The left atrium is one of the heart's two upper chambers, and the LAA is a small sac in the wall of the left atrium. The device closes the LAA. This procedure can help prevent a stroke caused by atrial fibrillation. Atrial fibrillation is a type of irregular or rapid heartbeat (arrhythmia). When the heart does not beat normally and the heartbeat is irregular, there is an increased risk of blood clots and stroke. A blood clot can form in the LAA.   

## 2020-05-30 ENCOUNTER — Other Ambulatory Visit: Payer: Self-pay

## 2020-05-30 ENCOUNTER — Emergency Department (HOSPITAL_COMMUNITY): Payer: Medicare Other

## 2020-05-30 ENCOUNTER — Inpatient Hospital Stay (HOSPITAL_COMMUNITY)
Admission: EM | Admit: 2020-05-30 | Discharge: 2020-06-05 | DRG: 065 | Disposition: A | Discharge status: Rehabilitation Facility | Payer: Medicare Other | Attending: Internal Medicine | Admitting: Internal Medicine

## 2020-05-30 ENCOUNTER — Inpatient Hospital Stay (HOSPITAL_COMMUNITY): Payer: Medicare Other

## 2020-05-30 ENCOUNTER — Encounter (HOSPITAL_COMMUNITY): Payer: Self-pay | Admitting: Emergency Medicine

## 2020-05-30 DIAGNOSIS — Z20822 Contact with and (suspected) exposure to covid-19: Secondary | ICD-10-CM | POA: Diagnosis present

## 2020-05-30 DIAGNOSIS — Z87891 Personal history of nicotine dependence: Secondary | ICD-10-CM

## 2020-05-30 DIAGNOSIS — K219 Gastro-esophageal reflux disease without esophagitis: Secondary | ICD-10-CM | POA: Diagnosis present

## 2020-05-30 DIAGNOSIS — Z6830 Body mass index (BMI) 30.0-30.9, adult: Secondary | ICD-10-CM

## 2020-05-30 DIAGNOSIS — I4821 Permanent atrial fibrillation: Secondary | ICD-10-CM | POA: Diagnosis present

## 2020-05-30 DIAGNOSIS — I6389 Other cerebral infarction: Secondary | ICD-10-CM | POA: Diagnosis not present

## 2020-05-30 DIAGNOSIS — E039 Hypothyroidism, unspecified: Secondary | ICD-10-CM | POA: Diagnosis present

## 2020-05-30 DIAGNOSIS — R29810 Facial weakness: Secondary | ICD-10-CM | POA: Diagnosis present

## 2020-05-30 DIAGNOSIS — E669 Obesity, unspecified: Secondary | ICD-10-CM | POA: Diagnosis present

## 2020-05-30 DIAGNOSIS — I4891 Unspecified atrial fibrillation: Secondary | ICD-10-CM | POA: Diagnosis not present

## 2020-05-30 DIAGNOSIS — E785 Hyperlipidemia, unspecified: Secondary | ICD-10-CM | POA: Diagnosis present

## 2020-05-30 DIAGNOSIS — Z79890 Hormone replacement therapy (postmenopausal): Secondary | ICD-10-CM | POA: Diagnosis not present

## 2020-05-30 DIAGNOSIS — I63512 Cerebral infarction due to unspecified occlusion or stenosis of left middle cerebral artery: Secondary | ICD-10-CM | POA: Diagnosis present

## 2020-05-30 DIAGNOSIS — I10 Essential (primary) hypertension: Secondary | ICD-10-CM | POA: Diagnosis present

## 2020-05-30 DIAGNOSIS — R4702 Dysphasia: Secondary | ICD-10-CM | POA: Diagnosis present

## 2020-05-30 DIAGNOSIS — N179 Acute kidney failure, unspecified: Secondary | ICD-10-CM | POA: Diagnosis present

## 2020-05-30 DIAGNOSIS — I639 Cerebral infarction, unspecified: Secondary | ICD-10-CM | POA: Diagnosis not present

## 2020-05-30 DIAGNOSIS — R4701 Aphasia: Secondary | ICD-10-CM | POA: Diagnosis present

## 2020-05-30 DIAGNOSIS — G459 Transient cerebral ischemic attack, unspecified: Secondary | ICD-10-CM

## 2020-05-30 DIAGNOSIS — Z79899 Other long term (current) drug therapy: Secondary | ICD-10-CM

## 2020-05-30 DIAGNOSIS — M792 Neuralgia and neuritis, unspecified: Secondary | ICD-10-CM | POA: Diagnosis not present

## 2020-05-30 DIAGNOSIS — R208 Other disturbances of skin sensation: Secondary | ICD-10-CM | POA: Diagnosis not present

## 2020-05-30 DIAGNOSIS — G8191 Hemiplegia, unspecified affecting right dominant side: Secondary | ICD-10-CM | POA: Diagnosis present

## 2020-05-30 DIAGNOSIS — I482 Chronic atrial fibrillation, unspecified: Secondary | ICD-10-CM

## 2020-05-30 DIAGNOSIS — Z90710 Acquired absence of both cervix and uterus: Secondary | ICD-10-CM

## 2020-05-30 DIAGNOSIS — D509 Iron deficiency anemia, unspecified: Secondary | ICD-10-CM | POA: Diagnosis present

## 2020-05-30 DIAGNOSIS — I63412 Cerebral infarction due to embolism of left middle cerebral artery: Secondary | ICD-10-CM | POA: Diagnosis not present

## 2020-05-30 DIAGNOSIS — K5909 Other constipation: Secondary | ICD-10-CM | POA: Diagnosis present

## 2020-05-30 DIAGNOSIS — I480 Paroxysmal atrial fibrillation: Secondary | ICD-10-CM | POA: Diagnosis present

## 2020-05-30 DIAGNOSIS — R7303 Prediabetes: Secondary | ICD-10-CM | POA: Diagnosis not present

## 2020-05-30 LAB — COMPREHENSIVE METABOLIC PANEL
ALT: 13 U/L (ref 0–44)
AST: 16 U/L (ref 15–41)
Albumin: 4 g/dL (ref 3.5–5.0)
Alkaline Phosphatase: 60 U/L (ref 38–126)
Anion gap: 10 (ref 5–15)
BUN: 16 mg/dL (ref 8–23)
CO2: 23 mmol/L (ref 22–32)
Calcium: 8.8 mg/dL — ABNORMAL LOW (ref 8.9–10.3)
Chloride: 106 mmol/L (ref 98–111)
Creatinine, Ser: 0.91 mg/dL (ref 0.44–1.00)
GFR, Estimated: >60 mL/min (ref >60)
Glucose, Bld: 118 mg/dL — ABNORMAL HIGH (ref 70–99)
Potassium: 3.4 mmol/L — ABNORMAL LOW (ref 3.5–5.1)
Sodium: 139 mmol/L (ref 135–145)
Total Bilirubin: 1.7 mg/dL — ABNORMAL HIGH (ref 0.3–1.2)
Total Protein: 7.3 g/dL (ref 6.5–8.1)

## 2020-05-30 LAB — CBC WITH DIFFERENTIAL/PLATELET
Abs Immature Granulocytes: 0.02 10*3/uL (ref 0.00–0.07)
Basophils Absolute: 0.1 10*3/uL (ref 0.0–0.1)
Basophils Relative: 1 %
Eosinophils Absolute: 0.2 10*3/uL (ref 0.0–0.5)
Eosinophils Relative: 2 %
HCT: 40.7 % (ref 36.0–46.0)
Hemoglobin: 12.3 g/dL (ref 12.0–15.0)
Immature Granulocytes: 0 %
Lymphocytes Relative: 7 %
Lymphs Abs: 0.5 10*3/uL — ABNORMAL LOW (ref 0.7–4.0)
MCH: 27.2 pg (ref 26.0–34.0)
MCHC: 30.2 g/dL (ref 30.0–36.0)
MCV: 90 fL (ref 80.0–100.0)
Monocytes Absolute: 0.7 10*3/uL (ref 0.1–1.0)
Monocytes Relative: 8 %
Neutro Abs: 6.6 10*3/uL (ref 1.7–7.7)
Neutrophils Relative %: 82 %
Platelets: 196 10*3/uL (ref 150–400)
RBC: 4.52 MIL/uL (ref 3.87–5.11)
RDW: 15.9 % — ABNORMAL HIGH (ref 11.5–15.5)
WBC: 8.1 10*3/uL (ref 4.0–10.5)
nRBC: 0 % (ref 0.0–0.2)

## 2020-05-30 LAB — URINALYSIS, ROUTINE W REFLEX MICROSCOPIC
Bilirubin Urine: NEGATIVE
Glucose, UA: NEGATIVE mg/dL
Ketones, ur: 5 mg/dL — AB
Leukocytes,Ua: NEGATIVE
Nitrite: NEGATIVE
Protein, ur: 30 mg/dL — AB
Specific Gravity, Urine: 1.023 (ref 1.005–1.030)
pH: 5 (ref 5.0–8.0)

## 2020-05-30 LAB — RESP PANEL BY RT-PCR (FLU A&B, COVID) ARPGX2
Influenza A by PCR: NEGATIVE
Influenza B by PCR: NEGATIVE
SARS Coronavirus 2 by RT PCR: NEGATIVE

## 2020-05-30 MED ORDER — LEVOTHYROXINE SODIUM 112 MCG PO TABS
112.0000 ug | ORAL_TABLET | Freq: Every morning | ORAL | Status: DC
  Administered 2020-05-31 – 2020-06-05 (×6): 112 ug via ORAL
  Filled 2020-05-30 (×5): qty 1

## 2020-05-30 MED ORDER — ACETAMINOPHEN 325 MG PO TABS
650.0000 mg | ORAL_TABLET | ORAL | Status: DC | PRN
  Administered 2020-05-31 – 2020-06-04 (×7): 650 mg via ORAL
  Filled 2020-05-30 (×7): qty 2

## 2020-05-30 MED ORDER — ACETAMINOPHEN 650 MG RE SUPP: 650.0000 mg | RECTAL | Status: DC | PRN

## 2020-05-30 MED ORDER — STROKE: EARLY STAGES OF RECOVERY BOOK
Freq: Once | Status: AC
  Filled 2020-05-30 (×2): qty 1

## 2020-05-30 MED ORDER — PANTOPRAZOLE SODIUM 40 MG PO TBEC
40.0000 mg | DELAYED_RELEASE_TABLET | Freq: Every day | ORAL | Status: DC
  Administered 2020-05-31 – 2020-06-05 (×6): 40 mg via ORAL
  Filled 2020-05-30 (×6): qty 1

## 2020-05-30 MED ORDER — SERTRALINE HCL 50 MG PO TABS
50.0000 mg | ORAL_TABLET | Freq: Every day | ORAL | Status: DC
  Administered 2020-05-31 – 2020-06-05 (×6): 50 mg via ORAL
  Filled 2020-05-30 (×6): qty 1

## 2020-05-30 MED ORDER — ASPIRIN EC 81 MG PO TBEC
81.0000 mg | DELAYED_RELEASE_TABLET | Freq: Every day | ORAL | Status: DC
  Administered 2020-05-31 – 2020-06-01 (×2): 81 mg via ORAL
  Filled 2020-05-30 (×2): qty 1

## 2020-05-30 MED ORDER — ACETAMINOPHEN 160 MG/5ML PO SOLN
650.0000 mg | ORAL | Status: DC | PRN
  Administered 2020-06-03: 650 mg

## 2020-05-30 MED ORDER — SENNOSIDES-DOCUSATE SODIUM 8.6-50 MG PO TABS: 1.0000 | ORAL_TABLET | Freq: Every evening | ORAL | Status: DC | PRN

## 2020-05-30 MED ORDER — ENOXAPARIN SODIUM 40 MG/0.4ML ~~LOC~~ SOLN
40.0000 mg | Freq: Every day | SUBCUTANEOUS | Status: DC
  Administered 2020-05-30 – 2020-05-31 (×2): 40 mg via SUBCUTANEOUS
  Filled 2020-05-30 (×2): qty 0.4

## 2020-05-30 NOTE — ED Provider Notes (Signed)
Care assumed from Alyse Low. See her note for full H&P.   Per her note, " The history is provided by the patient. No language interpreter was used.  Altered Mental Status Severity:  Moderate Most recent episode:  Yesterday Duration:  1 day Timing:  Constant Progression:  Worsening Chronicity:  New Context: not alcohol use, not dementia and not recent illness   Associated symptoms: no abdominal pain, no seizures and no vomiting    Pt lives at home alone.Pt's daughter reports pt had difficulty walking yesterday.  Pt started using a walker and has weakness on right side.  Pt's daughter reports pt is having trouble speaking.  "   Physical Exam  BP (!) 147/89   Pulse (!) 105   Temp 98.8 F (37.1 C) (Oral)   Resp 15   SpO2 94%   Physical Exam Vitals and nursing note reviewed.  Constitutional:      General: She is not in acute distress.    Appearance: She is well-developed.  HENT:     Head: Normocephalic and atraumatic.  Eyes:     Conjunctiva/sclera: Conjunctivae normal.  Cardiovascular:     Rate and Rhythm: Normal rate.  Pulmonary:     Effort: Pulmonary effort is normal.  Musculoskeletal:        General: Normal range of motion.     Cervical back: Neck supple.  Skin:    General: Skin is warm and dry.  Neurological:     Mental Status: She is alert.     Comments: aphasic      ED Course/Procedures     Procedures  Results for orders placed or performed during the hospital encounter of 05/30/20  Resp Panel by RT-PCR (Flu A&B, Covid) Nasopharyngeal Swab   Specimen: Nasopharyngeal Swab; Nasopharyngeal(NP) swabs in vial transport medium  Result Value Ref Range   SARS Coronavirus 2 by RT PCR NEGATIVE NEGATIVE   Influenza A by PCR NEGATIVE NEGATIVE   Influenza B by PCR NEGATIVE NEGATIVE  Urinalysis, Routine w reflex microscopic Urine, Clean Catch  Result Value Ref Range   Color, Urine AMBER (A) YELLOW   APPearance CLEAR CLEAR   Specific Gravity, Urine 1.023 1.005 -  1.030   pH 5.0 5.0 - 8.0   Glucose, UA NEGATIVE NEGATIVE mg/dL   Hgb urine dipstick SMALL (A) NEGATIVE   Bilirubin Urine NEGATIVE NEGATIVE   Ketones, ur 5 (A) NEGATIVE mg/dL   Protein, ur 30 (A) NEGATIVE mg/dL   Nitrite NEGATIVE NEGATIVE   Leukocytes,Ua NEGATIVE NEGATIVE   RBC / HPF 11-20 0 - 5 RBC/hpf   WBC, UA 0-5 0 - 5 WBC/hpf   Bacteria, UA FEW (A) NONE SEEN   Squamous Epithelial / LPF 0-5 0 - 5   Mucus PRESENT    Hyaline Casts, UA PRESENT   CBC with Differential/Platelet  Result Value Ref Range   WBC 8.1 4.0 - 10.5 K/uL   RBC 4.52 3.87 - 5.11 MIL/uL   Hemoglobin 12.3 12.0 - 15.0 g/dL   HCT 40.7 36.0 - 46.0 %   MCV 90.0 80.0 - 100.0 fL   MCH 27.2 26.0 - 34.0 pg   MCHC 30.2 30.0 - 36.0 g/dL   RDW 15.9 (H) 11.5 - 15.5 %   Platelets 196 150 - 400 K/uL   nRBC 0.0 0.0 - 0.2 %   Neutrophils Relative % 82 %   Neutro Abs 6.6 1.7 - 7.7 K/uL   Lymphocytes Relative 7 %   Lymphs Abs 0.5 (L) 0.7 -  4.0 K/uL   Monocytes Relative 8 %   Monocytes Absolute 0.7 0.1 - 1.0 K/uL   Eosinophils Relative 2 %   Eosinophils Absolute 0.2 0.0 - 0.5 K/uL   Basophils Relative 1 %   Basophils Absolute 0.1 0.0 - 0.1 K/uL   Immature Granulocytes 0 %   Abs Immature Granulocytes 0.02 0.00 - 0.07 K/uL  Comprehensive metabolic panel  Result Value Ref Range   Sodium 139 135 - 145 mmol/L   Potassium 3.4 (L) 3.5 - 5.1 mmol/L   Chloride 106 98 - 111 mmol/L   CO2 23 22 - 32 mmol/L   Glucose, Bld 118 (H) 70 - 99 mg/dL   BUN 16 8 - 23 mg/dL   Creatinine, Ser 0.91 0.44 - 1.00 mg/dL   Calcium 8.8 (L) 8.9 - 10.3 mg/dL   Total Protein 7.3 6.5 - 8.1 g/dL   Albumin 4.0 3.5 - 5.0 g/dL   AST 16 15 - 41 U/L   ALT 13 0 - 44 U/L   Alkaline Phosphatase 60 38 - 126 U/L   Total Bilirubin 1.7 (H) 0.3 - 1.2 mg/dL   GFR, Estimated >60 >60 mL/min   Anion gap 10 5 - 15   CT Head Wo Contrast  Result Date: 05/30/2020 CLINICAL DATA:  Altered mental status, last known normal 12 hours ago EXAM: CT HEAD WITHOUT CONTRAST  TECHNIQUE: Contiguous axial images were obtained from the base of the skull through the vertex without intravenous contrast. COMPARISON:  None. FINDINGS: Brain: No evidence of acute large vascular territory infarction, hemorrhage, hydrocephalus, extra-axial collection or mass lesion/mass effect. Age related global parenchymal volume loss with ex vacuo dilatation of ventricular system and prominent extra-axial spaces. There are patchy subcortical and periventricular white matter hypodensities which are nonspecific but favored represent sequela of chronic ischemic small vessel white matter disease. Vascular: No hyperdense vessel. Atherosclerotic calcifications of the intracranial portions of the internal carotid and vertebral arteries. Skull: Hyperostosis frontalis. Negative for fracture or focal lesion. Sinuses/Orbits: The paranasal sinuses and mastoid air cells are predominantly clear. Pneumatized petrous apices. Orbits are grossly unremarkable. Other: None IMPRESSION: 1. No acute intracranial findings. 2. Age related global parenchymal volume loss and chronic ischemic small vessel white matter disease. Electronically Signed   By: Dahlia Bailiff MD   On: 05/30/2020 14:34   MR ANGIO HEAD WO CONTRAST  Result Date: 05/30/2020 CLINICAL DATA:  Neuro deficit, acute stroke suspected. EXAM: MRI HEAD WITHOUT CONTRAST MRA HEAD WITHOUT CONTRAST TECHNIQUE: Multiplanar, multiecho pulse sequences of the brain and surrounding structures were obtained without intravenous contrast. Angiographic images of the head were obtained using MRA technique without contrast. COMPARISON:  None. FINDINGS: MRI HEAD FINDINGS Brain: Multiple acute infarcts in the left MCA territory, including acute infarct of the posterior left insula, left periventricular frontal lobe and left frontoparietal cortex superiorly. There is T2/FLAIR hyperintense mild edema associated with these infarcts. No substantial mass effect. No midline shift. Basal cisterns  are patent. No evidence of acute hemorrhage. No extra-axial fluid collections. Additional moderate T2/FLAIR hyperintensities within the white matter and pons, nonspecific but most likely related to chronic microvascular ischemic disease. Frontal predominant cerebral volume loss with associated prominence of the overlying extra-axial spaces. Vascular: Major arterial flow voids are maintained at the skull base. Skull and upper cervical spine: Normal marrow signal. Sinuses/Orbits: Clear sinuses.  Unremarkable orbits. Other: No sizable mastoid effusions. MRA HEAD FINDINGS Anterior circulation: Bilateral ICAs are patent through the carotid terminus. Bilateral M1 MCAs are patent. Left proximal  M2 MCA inferior division is poorly visualized, but within an area of artifact. There is occlusion of two other proximal left M2 MCA branches in the left sylvian fissure (see series 24, images 41 and 45). Additionally, there is suspected occlusion of a more distal left M2/M3 MCA branch (series 24, image 30). Smaller left A1 ACA, likely congenital. Otherwise, patent ACAs. Possible small (approximately 2 mm) outpouching in the region of the right ICA terminus. Posterior circulation: Patent intradural vertebral arteries and basilar artery. Right fetal type PCA. Patent proximal posterior cerebral arteries with suspected mild stenosis of the right P2 PCA. IMPRESSION: 1. Left posterior MCA territory acute infarcts with occlusion of multiple left M2 and M2/M3 MCA branches, as described above. Associated edema without substantial mass effect. 2. Moderate chronic microvascular ischemic disease. 3. Frontal predominant cerebral volume loss. 4. Possible small (approximately 2 mm) aneurysm in the region of the right ICA terminus. A CTA could further characterize if clinically indicated Critical/urgent findings discussed with Dr. Rory Percy at 6:14 p.m via telephone. Electronically Signed   By: Margaretha Sheffield MD   On: 05/30/2020 18:43   MR BRAIN  WO CONTRAST  Result Date: 05/30/2020 CLINICAL DATA:  Neuro deficit, acute stroke suspected. EXAM: MRI HEAD WITHOUT CONTRAST MRA HEAD WITHOUT CONTRAST TECHNIQUE: Multiplanar, multiecho pulse sequences of the brain and surrounding structures were obtained without intravenous contrast. Angiographic images of the head were obtained using MRA technique without contrast. COMPARISON:  None. FINDINGS: MRI HEAD FINDINGS Brain: Multiple acute infarcts in the left MCA territory, including acute infarct of the posterior left insula, left periventricular frontal lobe and left frontoparietal cortex superiorly. There is T2/FLAIR hyperintense mild edema associated with these infarcts. No substantial mass effect. No midline shift. Basal cisterns are patent. No evidence of acute hemorrhage. No extra-axial fluid collections. Additional moderate T2/FLAIR hyperintensities within the white matter and pons, nonspecific but most likely related to chronic microvascular ischemic disease. Frontal predominant cerebral volume loss with associated prominence of the overlying extra-axial spaces. Vascular: Major arterial flow voids are maintained at the skull base. Skull and upper cervical spine: Normal marrow signal. Sinuses/Orbits: Clear sinuses.  Unremarkable orbits. Other: No sizable mastoid effusions. MRA HEAD FINDINGS Anterior circulation: Bilateral ICAs are patent through the carotid terminus. Bilateral M1 MCAs are patent. Left proximal M2 MCA inferior division is poorly visualized, but within an area of artifact. There is occlusion of two other proximal left M2 MCA branches in the left sylvian fissure (see series 24, images 41 and 45). Additionally, there is suspected occlusion of a more distal left M2/M3 MCA branch (series 24, image 30). Smaller left A1 ACA, likely congenital. Otherwise, patent ACAs. Possible small (approximately 2 mm) outpouching in the region of the right ICA terminus. Posterior circulation: Patent intradural  vertebral arteries and basilar artery. Right fetal type PCA. Patent proximal posterior cerebral arteries with suspected mild stenosis of the right P2 PCA. IMPRESSION: 1. Left posterior MCA territory acute infarcts with occlusion of multiple left M2 and M2/M3 MCA branches, as described above. Associated edema without substantial mass effect. 2. Moderate chronic microvascular ischemic disease. 3. Frontal predominant cerebral volume loss. 4. Possible small (approximately 2 mm) aneurysm in the region of the right ICA terminus. A CTA could further characterize if clinically indicated Critical/urgent findings discussed with Dr. Rory Percy at 6:14 p.m via telephone. Electronically Signed   By: Margaretha Sheffield MD   On: 05/30/2020 18:43   CRITICAL CARE Performed by: Rodney Booze   Total critical care time: 36 minutes  Critical care time was exclusive of separately billable procedures and treating other patients.  Critical care was necessary to treat or prevent imminent or life-threatening deterioration.  Critical care was time spent personally by me on the following activities: development of treatment plan with patient and/or surrogate as well as nursing, discussions with consultants, evaluation of patient's response to treatment, examination of patient, obtaining history from patient or surrogate, ordering and performing treatments and interventions, ordering and review of laboratory studies, ordering and review of radiographic studies, pulse oximetry and re-evaluation of patient's condition.   MDM   87 y/o f presenting for eval of stroke sxs. Out of stroke window. At shift change. Pending mri  Mri/mra demonstrates acute cva - will admit  Neuro contacted me regarding results of mri and recommend admission to [facility]  6:53 CONSULT with Dr. Ara Kussmaul who accepts patient for admission      Bishop Dublin 05/30/20 1910    Milton Ferguson, MD 05/30/20 [id number]

## 2020-05-30 NOTE — ED Notes (Signed)
Per ED PA-C, no code stroke to be called at this time.

## 2020-05-30 NOTE — ED Notes (Addendum)
Report given to IP RN at [facility].  Pt on stretcher and otw to ambulance bay currently via care link, [facility] medic reports BP of 146/106 at bedside prior to departing ER.  This blood pressure seems to have been pt's baseline for majority of the day.  Charge nurse Jenel Lucks made aware and stated the staff at [facility] will have to assist with BP if needed because pt is stable for transport and admitting doctor did not wish to order anything for BP today.  Nurse at [facility] stating the patient with this blood pressure is not stable for transport, however pt is stable for transport per ER and hospitalist physicians.  Pt has had no signs or symptoms of distress with her blood pressure today.  [facility] emergency physicians have been aware of the patient's BP and all vital signs today and have not found to their medical opinion to give antihypertensive medication.  ER and admitting doctor did not want to give medication to lower the blood pressure today due to patient having CVA.

## 2020-05-30 NOTE — ED Notes (Signed)
ED PA-C at the bedside. 20g IV established in the left AC. Blood work obtained and sent to lab. Pt attached to cardiac monitor x3.

## 2020-05-30 NOTE — ED Notes (Signed)
EKG obtained at this time.

## 2020-05-30 NOTE — ED Notes (Signed)
Pt to MRI at this time.

## 2020-05-30 NOTE — ED Notes (Signed)
Pt back from MRI 

## 2020-05-30 NOTE — ED Notes (Signed)
[facility] picking up patient and taking patient to [place].

## 2020-05-30 NOTE — ED Triage Notes (Addendum)
Patient here from home reporting altered mental status, Last seen normal 12 hours ago. Confusion and unable to communicate effectively. Lives alone, normally walks without assistant, now reports difficulty ambulating and following commands.

## 2020-05-30 NOTE — ED Provider Notes (Signed)
[facility] DEPT Provider Note   CSN: [id number] Arrival date & time: 05/30/20  1221     History Chief Complaint  Patient presents with  . Altered Mental Status    Katrina Randolph is a 87 y.o. female.  The history is provided by the patient. No language interpreter was used.  Altered Mental Status Severity:  Moderate Most recent episode:  Yesterday Duration:  1 day Timing:  Constant Progression:  Worsening Chronicity:  New Context: not alcohol use, not dementia and not recent illness   Associated symptoms: no abdominal pain, no seizures and no vomiting    Pt lives at home alone.Pt's daughter reports pt had difficulty walking yesterday.  Pt started using a walker and has weakness on right side.  Pt's daughter reports pt is having trouble speaking.      Past Medical History:  Diagnosis Date  . Atrial fibrillation [facility])     Patient Active Problem List   Diagnosis Date Noted  . Orthostatic hypotension 01/26/2020  . Essential hypertension 01/26/2020  . Acute respiratory failure ([place]) 10/19/2019  . Colon polyp 10/19/2019  . Orthostatic dizziness 10/19/2019  . Hypothyroidism 10/19/2019  . Rapid atrial fibrillation ([place]) 10/12/2019  . Pneumonia 10/12/2019  . Anemia requiring transfusions 10/12/2019  . DNR (do not resuscitate) 10/12/2019  . Acquired thrombophilia ([place]) 10/12/2019  . Iron deficiency anemia 11/24/2017  . Paroxysmal atrial fibrillation ([place]) 11/24/2017  . Chronic depression 11/24/2017  . GERD (gastroesophageal reflux disease) 11/24/2017  . Breast cancer ([place]) 11/24/2017  . Hypothyroid 11/24/2017  . Dyslipidemia 11/24/2017    Past Surgical History:  Procedure Laterality Date  . ABDOMINAL HYSTERECTOMY    . BIOPSY  10/19/2019   Procedure: BIOPSY;  Surgeon: [name], MD;  Location: [facility];  Service: Gastroenterology;;  . BREAST LUMPECTOMY    . CHOLECYSTECTOMY    . COLONOSCOPY WITH PROPOFOL N/A 10/19/2019    Procedure: COLONOSCOPY WITH PROPOFOL;  Surgeon: [name], MD;  Location: [facility];  Service: Gastroenterology;  Laterality: N/A;  . ESOPHAGOGASTRODUODENOSCOPY (EGD) WITH PROPOFOL N/A 10/19/2019   Procedure: ESOPHAGOGASTRODUODENOSCOPY (EGD) WITH PROPOFOL;  Surgeon: [name], MD;  Location: [facility];  Service: Gastroenterology;  Laterality: N/A;  . POLYPECTOMY  10/19/2019   Procedure: POLYPECTOMY;  Surgeon: [name], MD;  Location: [facility];  Service: Gastroenterology;;     OB History   No obstetric history on file.     No family history on file.  Social History   Tobacco Use  . Smoking status: Former Smoker    Types: Cigarettes  . Smokeless tobacco: Never Used  Vaping Use  . Vaping Use: Never used  Substance Use Topics  . Alcohol use: Yes  . Drug use: Never    Home Medications Prior to Admission medications   Medication Sig Start Date End Date Taking? Authorizing Provider  esomeprazole (NEXIUM) 20 MG capsule Take 20 mg by mouth daily.   Yes [provider]  furosemide (LASIX) 20 MG tablet Take 20 mg by mouth daily as needed for fluid.  10/03/19  Yes [provider]  levothyroxine (SYNTHROID) 112 MCG tablet Take 1 tablet (112 mcg total) by mouth every morning. 10/19/19  Yes Guilford Shi, MD  metoprolol succinate (TOPROL-XL) 50 MG 24 hr tablet Take 1 tablet (50 mg total) by mouth daily. Take with or immediately following a meal. 10/21/19  Yes Guilford Shi, MD  sertraline (ZOLOFT) 50 MG tablet Take 50 mg by mouth daily. 08/16/19  Yes [provider]  traZODone (DESYREL) 50 MG tablet Take 50 mg by mouth at bedtime as needed for sleep.  09/06/19  Yes [provider]    Allergies    Sulfa antibiotics  Review of Systems   Review of Systems  Gastrointestinal: Negative for abdominal pain and vomiting.  Neurological: Negative for seizures.  All other systems reviewed and are negative.   Physical  Exam Updated Vital Signs BP (!) 149/66   Pulse 82   Temp 98.8 F (37.1 C)   Resp 13   SpO2 97%   Physical Exam Vitals and nursing note reviewed.  Constitutional:      Appearance: She is well-developed.  HENT:     Head: Normocephalic.     Nose: Nose normal.     Mouth/Throat:     Mouth: Mucous membranes are moist.  Cardiovascular:     Rate and Rhythm: Normal rate and regular rhythm.  Pulmonary:     Effort: Pulmonary effort is normal.  Abdominal:     General: There is no distension.  Musculoskeletal:        General: Normal range of motion.     Cervical back: Normal range of motion.  Skin:    General: Skin is warm.  Neurological:     Mental Status: She is alert.     Comments: Difficulty forming sentences,  Weakness right arm and right leg,  Pt able to lift both but slower with right arm and right leg   Psychiatric:        Mood and Affect: Mood normal.     ED Results / Procedures / Treatments   Labs (all labs ordered are listed, but only abnormal results are displayed) Labs Reviewed  URINALYSIS, ROUTINE W REFLEX MICROSCOPIC - Abnormal; Notable for the following components:      Result Value   Color, Urine AMBER (*)    Hgb urine dipstick SMALL (*)    Ketones, ur 5 (*)    Protein, ur 30 (*)    Bacteria, UA FEW (*)    All other components within normal limits  CBC WITH DIFFERENTIAL/PLATELET - Abnormal; Notable for the following components:   RDW 15.9 (*)    Lymphs Abs 0.5 (*)    All other components within normal limits  COMPREHENSIVE METABOLIC PANEL - Abnormal; Notable for the following components:   Potassium 3.4 (*)    Glucose, Bld 118 (*)    Calcium 8.8 (*)    Total Bilirubin 1.7 (*)    All other components within normal limits    EKG None  Radiology CT Head Wo Contrast  Result Date: 05/30/2020 CLINICAL DATA:  Altered mental status, last known normal 12 hours ago EXAM: CT HEAD WITHOUT CONTRAST TECHNIQUE: Contiguous axial images were obtained from the  base of the skull through the vertex without intravenous contrast. COMPARISON:  None. FINDINGS: Brain: No evidence of acute large vascular territory infarction, hemorrhage, hydrocephalus, extra-axial collection or mass lesion/mass effect. Age related global parenchymal volume loss with ex vacuo dilatation of ventricular system and prominent extra-axial spaces. There are patchy subcortical and periventricular white matter hypodensities which are nonspecific but favored represent sequela of chronic ischemic small vessel white matter disease. Vascular: No hyperdense vessel. Atherosclerotic calcifications of the intracranial portions of the internal carotid and vertebral arteries. Skull: Hyperostosis frontalis. Negative for fracture or focal lesion. Sinuses/Orbits: The paranasal sinuses and mastoid air cells are predominantly clear. Pneumatized petrous apices. Orbits are grossly unremarkable. Other: None IMPRESSION: 1. No acute intracranial findings. 2. Age related  global parenchymal volume loss and chronic ischemic small vessel white matter disease. Electronically Signed   By: Dahlia Bailiff MD   On: 05/30/2020 14:34    Procedures Procedures   Medications Ordered in ED Medications - No data to display  ED Course  I have reviewed the triage vital signs and the nursing notes.  Pertinent labs & imaging results that were available during my care of the patient were reviewed by me and considered in my medical decision making (see chart for details).    MDM Rules/Calculators/A&P                          MDM: Ct scan no acute,  Neurology consult Dr, Rory Percy advised MRI brain  If positive for stroke pt will need to go to [facility], if negative can be admitted at [place] Impression(s) / ED Diagnoses Final diagnoses:  Cerebrovascular accident (CVA), unspecified mechanism [facility])    Rx / DC Orders ED Discharge Orders    None       Sidney Ace 06/03/20 1203    Arnaldo Natal,  MD 06/03/20 1520

## 2020-05-30 NOTE — H&P (Signed)
History and Physical   [facility] [place] Admission History and Physical [employer], D.O.    Patient Name: Katrina Randolph MR#: [id number] Date of Birth: Feb 23, 1930 Date of Admission: 05/30/2020  Referring MD/NP/PA: Coral Ceo Primary Care Physician: [name], PA-C  Chief Complaint:  Chief Complaint  Patient presents with  . Altered Mental Status    HPI: [name] is a 87 y.o. female with a known history of atrial fibrillation not on anticoagulation, hypothyroidism presents to the emergency department for evaluation of altered mental status.  Patient was in a usual state of health until 18 hours prior to arrival she was last seen normal.  She states that she lost her balance and fell.  She subsequently reported right upper extremity and right lower extremity weakness associated with difficulty with word finding, mild confusion..  Events were unwitnessed.  Patient called her daughter.  She lives alone, normally functionally independent, ambulates without assistance  At present patient reports difficulty with word finding, right sided weakness and decreased sensation on the right.  Patient denies fevers/chills,, dizziness, chest pain, shortness of breath, N/V/C/D, abdominal pain, dysuria/frequency.    Otherwise there has been no change in status. Patient has been taking medication as prescribed and there has been no recent change in medication or diet.  No recent antibiotics.  There has been no recent illness, hospitalizations, travel or sick contacts.    EMS/ED Course: Patient received aspirin. Medical admission has been requested for further management of left MCA CVA.  Review of Systems:  CONSTITUTIONAL: Positive weakness as per HPI no fever/chills, fatigue, weight gain/loss, headache. EYES: No blurry or double vision. ENT: No tinnitus, postnasal drip, redness or soreness of the oropharynx. RESPIRATORY: No cough, dyspnea,  wheeze.  No hemoptysis.  CARDIOVASCULAR: No chest pain, palpitations, syncope, orthopnea. No lower extremity edema.  GASTROINTESTINAL: No nausea, vomiting, abdominal pain, diarrhea, constipation.  No hematemesis, melena or hematochezia. GENITOURINARY: No dysuria, frequency, hematuria. ENDOCRINE: No polyuria or nocturia. No heat or cold intolerance. HEMATOLOGY: No anemia, bruising, bleeding. INTEGUMENTARY: No rashes, ulcers, lesions. MUSCULOSKELETAL: No arthritis, gout, dyspnea. NEUROLOGIC: Per HPI  PSYCHIATRIC: No anxiety, depression, insomnia.   Past Medical History:  Diagnosis Date  . Atrial fibrillation [facility])     Past Surgical History:  Procedure Laterality Date  . ABDOMINAL HYSTERECTOMY    . BIOPSY  10/19/2019   Procedure: BIOPSY;  Surgeon: [name], MD;  Location: [facility];  Service: Gastroenterology;;  . BREAST LUMPECTOMY    . CHOLECYSTECTOMY    . COLONOSCOPY WITH PROPOFOL N/A 10/19/2019   Procedure: COLONOSCOPY WITH PROPOFOL;  Surgeon: [name], MD;  Location: [facility];  Service: Gastroenterology;  Laterality: N/A;  . ESOPHAGOGASTRODUODENOSCOPY (EGD) WITH PROPOFOL N/A 10/19/2019   Procedure: ESOPHAGOGASTRODUODENOSCOPY (EGD) WITH PROPOFOL;  Surgeon: [name], MD;  Location: [facility];  Service: Gastroenterology;  Laterality: N/A;  . POLYPECTOMY  10/19/2019   Procedure: POLYPECTOMY;  Surgeon: [name], MD;  Location: [facility];  Service: Gastroenterology;;     reports that she has quit smoking. Her smoking use included cigarettes. She has never used smokeless tobacco. She reports current alcohol use. She reports that she does not use drugs.  Allergies  Allergen Reactions  . Sulfa Antibiotics     "feels bad"    No family history on file.  Prior to Admission medications   Medication Sig Start Date End Date Taking? Authorizing Provider  esomeprazole (NEXIUM) 20 MG capsule Take 20 mg by mouth daily.   Yes  [provider]   furosemide (LASIX) 20 MG tablet Take 20 mg by mouth daily as needed for fluid.  10/03/19  Yes [provider]  levothyroxine (SYNTHROID) 112 MCG tablet Take 1 tablet (112 mcg total) by mouth every morning. 10/19/19  Yes Guilford Shi, MD  metoprolol succinate (TOPROL-XL) 50 MG 24 hr tablet Take 1 tablet (50 mg total) by mouth daily. Take with or immediately following a meal. 10/21/19  Yes Guilford Shi, MD  sertraline (ZOLOFT) 50 MG tablet Take 50 mg by mouth daily. 08/16/19  Yes [provider]  traZODone (DESYREL) 50 MG tablet Take 50 mg by mouth at bedtime as needed for sleep.  09/06/19  Yes [provider]    Physical Exam: Vitals:   05/30/20 1815 05/30/20 1830 05/30/20 1845 05/30/20 1915  BP: (!) 142/120 139/86 (!) 147/89 (!) 152/82  Pulse: 84 96 (!) 105 89  Resp: 20 19 15 16   Temp:      TempSrc:      SpO2: 96% 92% 94% 97%    GENERAL: 87 y.o.-year-old white female patient, well-developed, well-nourished lying in the bed in no acute distress.  Pleasant and cooperative.   HEENT: Head atraumatic, normocephalic. Pupils equal. Mucus membranes moist. NECK: Supple. No JVD. CHEST: Normal breath sounds bilaterally. No wheezing, rales, rhonchi or crackles. No use of accessory muscles of respiration.  No reproducible chest wall tenderness.  CARDIOVASCULAR: Regularly irregular S1, S2 normal. No murmurs, rubs, or gallops. Cap refill <2 seconds. Pulses intact distally.  ABDOMEN: Soft, nondistended, nontender. No rebound, guarding, rigidity. Normoactive bowel sounds present in all four quadrants.  EXTREMITIES: No pedal edema, cyanosis, or clubbing. No calf tenderness or Homan's sign.  NEUROLOGIC: The patient is alert and oriented x 3 but has difficulty recalling her last name and location.  She does know date and the president.  Right upper extremity weakness, decrease sensation on the right side of the face arm and leg.  Difficulty with word finding. PSYCHIATRIC:  Normal  affect, mood, thought content. SKIN: Warm, dry, and intact without obvious rash, lesion, or ulcer.    Labs on Admission:  CBC: Recent Labs  Lab 05/30/20 1338  WBC 8.1  NEUTROABS 6.6  HGB 12.3  HCT 40.7  MCV 90.0  PLT 433   Basic Metabolic Panel: Recent Labs  Lab 05/30/20 1338  NA 139  K 3.4*  CL 106  CO2 23  GLUCOSE 118*  BUN 16  CREATININE 0.91  CALCIUM 8.8*   GFR: CrCl cannot be calculated (Unknown ideal weight.). Liver Function Tests: Recent Labs  Lab 05/30/20 1338  AST 16  ALT 13  ALKPHOS 60  BILITOT 1.7*  PROT 7.3  ALBUMIN 4.0   No results for input(s): LIPASE, AMYLASE in the last 168 hours. No results for input(s): AMMONIA in the last 168 hours. Coagulation Profile: No results for input(s): INR, PROTIME in the last 168 hours. Cardiac Enzymes: No results for input(s): CKTOTAL, CKMB, CKMBINDEX, TROPONINI in the last 168 hours. BNP (last 3 results) No results for input(s): PROBNP in the last 8760 hours. HbA1C: No results for input(s): HGBA1C in the last 72 hours. CBG: No results for input(s): GLUCAP in the last 168 hours. Lipid Profile: No results for input(s): CHOL, HDL, LDLCALC, TRIG, CHOLHDL, LDLDIRECT in the last 72 hours. Thyroid Function Tests: No results for input(s): TSH, T4TOTAL, FREET4, T3FREE, THYROIDAB in the last 72 hours. Anemia Panel: No results for input(s): VITAMINB12, FOLATE, FERRITIN, TIBC, IRON, RETICCTPCT in the last 72 hours.  Urine analysis:    Component Value Date/Time   COLORURINE AMBER (A) 05/30/2020 1332   APPEARANCEUR CLEAR 05/30/2020 1332   LABSPEC 1.023 05/30/2020 1332   PHURINE 5.0 05/30/2020 1332   GLUCOSEU NEGATIVE 05/30/2020 1332   HGBUR SMALL (A) 05/30/2020 1332   BILIRUBINUR NEGATIVE 05/30/2020 1332   KETONESUR 5 (A) 05/30/2020 1332   PROTEINUR 30 (A) 05/30/2020 1332   NITRITE NEGATIVE 05/30/2020 1332   LEUKOCYTESUR NEGATIVE 05/30/2020 1332   Sepsis  Labs: @LABRCNTIP (procalcitonin:4,lacticidven:4) ) Recent Results (from the past 240 hour(s))  Resp Panel by RT-PCR (Flu A&B, Covid) Nasopharyngeal Swab     Status: None   Collection Time: 05/30/20  3:29 PM   Specimen: Nasopharyngeal Swab; Nasopharyngeal(NP) swabs in vial transport medium  Result Value Ref Range Status   SARS Coronavirus 2 by RT PCR NEGATIVE NEGATIVE Final    Comment: (NOTE) SARS-CoV-2 target nucleic acids are NOT DETECTED.  The SARS-CoV-2 RNA is generally detectable in upper respiratory specimens during the acute phase of infection. The lowest concentration of SARS-CoV-2 viral copies this assay can detect is 138 copies/mL. A negative result does not preclude SARS-Cov-2 infection and should not be used as the sole basis for treatment or other patient management decisions. A negative result may occur with  improper specimen collection/handling, submission of specimen other than nasopharyngeal swab, presence of viral mutation(s) within the areas targeted by this assay, and inadequate number of viral copies(<138 copies/mL). A negative result must be combined with clinical observations, patient history, and epidemiological information. The expected result is Negative.  Fact Sheet for Patients:  EntrepreneurPulse.com.au  Fact Sheet for Healthcare Providers:  IncredibleEmployment.be  This test is no t yet approved or cleared by the [place] FDA and  has been authorized for detection and/or diagnosis of SARS-CoV-2 by FDA under an Emergency Use Authorization (EUA). This EUA will remain  in effect (meaning this test can be used) for the duration of the COVID-19 declaration under Section 564(b)(1) of the Act, 21 U.S.C.section 360bbb-3(b)(1), unless the authorization is terminated  or revoked sooner.       Influenza A by PCR NEGATIVE NEGATIVE Final   Influenza B by PCR NEGATIVE NEGATIVE Final    Comment: (NOTE) The Xpert Xpress  SARS-CoV-2/FLU/RSV plus assay is intended as an aid in the diagnosis of influenza from Nasopharyngeal swab specimens and should not be used as a sole basis for treatment. Nasal washings and aspirates are unacceptable for Xpert Xpress SARS-CoV-2/FLU/RSV testing.  Fact Sheet for Patients: EntrepreneurPulse.com.au  Fact Sheet for Healthcare Providers: IncredibleEmployment.be  This test is not yet approved or cleared by the [place] FDA and has been authorized for detection and/or diagnosis of SARS-CoV-2 by FDA under an Emergency Use Authorization (EUA). This EUA will remain in effect (meaning this test can be used) for the duration of the COVID-19 declaration under Section 564(b)(1) of the Act, 21 U.S.C. section 360bbb-3(b)(1), unless the authorization is terminated or revoked.  Performed at [facility], [place] [address], [place], [place] [address]      Radiological Exams on Admission: CT Head Wo Contrast  Result Date: 05/30/2020 CLINICAL DATA:  Altered mental status, last known normal 12 hours ago EXAM: CT HEAD WITHOUT CONTRAST TECHNIQUE: Contiguous axial images were obtained from the base of the skull through the vertex without intravenous contrast. COMPARISON:  None. FINDINGS: Brain: No evidence of acute large vascular territory infarction, hemorrhage, hydrocephalus, extra-axial collection or mass lesion/mass effect. Age related global parenchymal volume loss with ex vacuo dilatation  of ventricular system and prominent extra-axial spaces. There are patchy subcortical and periventricular white matter hypodensities which are nonspecific but favored represent sequela of chronic ischemic small vessel white matter disease. Vascular: No hyperdense vessel. Atherosclerotic calcifications of the intracranial portions of the internal carotid and vertebral arteries. Skull: Hyperostosis frontalis. Negative for fracture or focal lesion.  Sinuses/Orbits: The paranasal sinuses and mastoid air cells are predominantly clear. Pneumatized petrous apices. Orbits are grossly unremarkable. Other: None IMPRESSION: 1. No acute intracranial findings. 2. Age related global parenchymal volume loss and chronic ischemic small vessel white matter disease. Electronically Signed   By: Dahlia Bailiff MD   On: 05/30/2020 14:34   MR ANGIO HEAD WO CONTRAST  Result Date: 05/30/2020 CLINICAL DATA:  Neuro deficit, acute stroke suspected. EXAM: MRI HEAD WITHOUT CONTRAST MRA HEAD WITHOUT CONTRAST TECHNIQUE: Multiplanar, multiecho pulse sequences of the brain and surrounding structures were obtained without intravenous contrast. Angiographic images of the head were obtained using MRA technique without contrast. COMPARISON:  None. FINDINGS: MRI HEAD FINDINGS Brain: Multiple acute infarcts in the left MCA territory, including acute infarct of the posterior left insula, left periventricular frontal lobe and left frontoparietal cortex superiorly. There is T2/FLAIR hyperintense mild edema associated with these infarcts. No substantial mass effect. No midline shift. Basal cisterns are patent. No evidence of acute hemorrhage. No extra-axial fluid collections. Additional moderate T2/FLAIR hyperintensities within the white matter and pons, nonspecific but most likely related to chronic microvascular ischemic disease. Frontal predominant cerebral volume loss with associated prominence of the overlying extra-axial spaces. Vascular: Major arterial flow voids are maintained at the skull base. Skull and upper cervical spine: Normal marrow signal. Sinuses/Orbits: Clear sinuses.  Unremarkable orbits. Other: No sizable mastoid effusions. MRA HEAD FINDINGS Anterior circulation: Bilateral ICAs are patent through the carotid terminus. Bilateral M1 MCAs are patent. Left proximal M2 MCA inferior division is poorly visualized, but within an area of artifact. There is occlusion of two other  proximal left M2 MCA branches in the left sylvian fissure (see series 24, images 41 and 45). Additionally, there is suspected occlusion of a more distal left M2/M3 MCA branch (series 24, image 30). Smaller left A1 ACA, likely congenital. Otherwise, patent ACAs. Possible small (approximately 2 mm) outpouching in the region of the right ICA terminus. Posterior circulation: Patent intradural vertebral arteries and basilar artery. Right fetal type PCA. Patent proximal posterior cerebral arteries with suspected mild stenosis of the right P2 PCA. IMPRESSION: 1. Left posterior MCA territory acute infarcts with occlusion of multiple left M2 and M2/M3 MCA branches, as described above. Associated edema without substantial mass effect. 2. Moderate chronic microvascular ischemic disease. 3. Frontal predominant cerebral volume loss. 4. Possible small (approximately 2 mm) aneurysm in the region of the right ICA terminus. A CTA could further characterize if clinically indicated Critical/urgent findings discussed with Dr. Rory Percy at 6:14 p.m via telephone. Electronically Signed   By: Margaretha Sheffield MD   On: 05/30/2020 18:43   MR BRAIN WO CONTRAST  Result Date: 05/30/2020 CLINICAL DATA:  Neuro deficit, acute stroke suspected. EXAM: MRI HEAD WITHOUT CONTRAST MRA HEAD WITHOUT CONTRAST TECHNIQUE: Multiplanar, multiecho pulse sequences of the brain and surrounding structures were obtained without intravenous contrast. Angiographic images of the head were obtained using MRA technique without contrast. COMPARISON:  None. FINDINGS: MRI HEAD FINDINGS Brain: Multiple acute infarcts in the left MCA territory, including acute infarct of the posterior left insula, left periventricular frontal lobe and left frontoparietal cortex superiorly. There is T2/FLAIR hyperintense mild edema  associated with these infarcts. No substantial mass effect. No midline shift. Basal cisterns are patent. No evidence of acute hemorrhage. No extra-axial fluid  collections. Additional moderate T2/FLAIR hyperintensities within the white matter and pons, nonspecific but most likely related to chronic microvascular ischemic disease. Frontal predominant cerebral volume loss with associated prominence of the overlying extra-axial spaces. Vascular: Major arterial flow voids are maintained at the skull base. Skull and upper cervical spine: Normal marrow signal. Sinuses/Orbits: Clear sinuses.  Unremarkable orbits. Other: No sizable mastoid effusions. MRA HEAD FINDINGS Anterior circulation: Bilateral ICAs are patent through the carotid terminus. Bilateral M1 MCAs are patent. Left proximal M2 MCA inferior division is poorly visualized, but within an area of artifact. There is occlusion of two other proximal left M2 MCA branches in the left sylvian fissure (see series 24, images 41 and 45). Additionally, there is suspected occlusion of a more distal left M2/M3 MCA branch (series 24, image 30). Smaller left A1 ACA, likely congenital. Otherwise, patent ACAs. Possible small (approximately 2 mm) outpouching in the region of the right ICA terminus. Posterior circulation: Patent intradural vertebral arteries and basilar artery. Right fetal type PCA. Patent proximal posterior cerebral arteries with suspected mild stenosis of the right P2 PCA. IMPRESSION: 1. Left posterior MCA territory acute infarcts with occlusion of multiple left M2 and M2/M3 MCA branches, as described above. Associated edema without substantial mass effect. 2. Moderate chronic microvascular ischemic disease. 3. Frontal predominant cerebral volume loss. 4. Possible small (approximately 2 mm) aneurysm in the region of the right ICA terminus. A CTA could further characterize if clinically indicated Critical/urgent findings discussed with Dr. Rory Percy at 6:14 p.m via telephone. Electronically Signed   By: Margaretha Sheffield MD   On: 05/30/2020 18:43    EKG: Atrial fibrillation at 94 bpm  Assessment/Plan  This is a 87  y.o. female with a history of atrial fibrillation not on anticoagulation, hypothyroidism now being admitted with:  #.  Left posterior MCA CVA -  - Admit telemetry at [facility] for neuro workup including: - Studies: Echo, Carotids - Labs: CBC, BMP, Lipids, TFTs, A1C - Nursing: Neurochecks, O2, dysphagia screen, permissive hypertension.  - Consults: Neurology, PT/OT, S/S consults.  - Meds: Daily aspirin 81mg .   - Fluids: IVNS@75cc /hr.   - Routine DVT Px: with Lovenox, SCDs, early ambulation  #.  History of atrial fibrillation - Hold metoprolol and Lasix reports hypertension  #.  Hypothyroidism - Continue levothyroxine   Admission status: Inpatient, telemetry IV Fluids: Normal saline Diet/Nutrition: N.p.o. pending swallow eval Consults called: Neurology was called by emergency department physician DVT Px: Lovenox, SCDs and early ambulation. Code Status: Full Code  Disposition Plan: To be determined.  All the records are reviewed and case discussed with ED provider. Management plans discussed with the patient and/or family who express understanding and agree with plan of care.  Shakila Mak D.O. on 05/30/2020 at 7:43 PM CC: Primary care physician; [name], PA-C   05/30/2020, 7:43 PM

## 2020-05-30 NOTE — ED Notes (Signed)
Pt's daughter at the bedside. Per pt's daughter, pt's last known normal around 12 hrs ago at 1500 on 05/29/20. On assessment patient answers person and time. Unable to correctly state location. Pt reports decreased sensation on the right versus the left. There is expressive aphasia. ED PA-C Caryl Ada notified and aware.

## 2020-05-30 NOTE — ED Notes (Signed)
[facility] at bedside to transport pt to [facility].

## 2020-05-30 NOTE — ED Notes (Signed)
Called and talk to receiving nurse at [facility] cone that admitting md said patient is not going to have any blood pressure medication due to her cva. Md wants patients blood pressure to be on the high side.

## 2020-05-30 NOTE — ED Notes (Signed)
Attempted to call report to IP RN at [facility], told by staff the RN is busy and would have to call me back.

## 2020-05-30 NOTE — ED Notes (Signed)
Pt back from CT

## 2020-05-31 ENCOUNTER — Telehealth: Payer: Self-pay | Admitting: Cardiology

## 2020-05-31 ENCOUNTER — Inpatient Hospital Stay (HOSPITAL_COMMUNITY): Payer: Medicare Other

## 2020-05-31 DIAGNOSIS — I639 Cerebral infarction, unspecified: Secondary | ICD-10-CM

## 2020-05-31 DIAGNOSIS — I6389 Other cerebral infarction: Secondary | ICD-10-CM

## 2020-05-31 LAB — CBC
HCT: 39.5 % (ref 36.0–46.0)
Hemoglobin: 12.2 g/dL (ref 12.0–15.0)
MCH: 27.3 pg (ref 26.0–34.0)
MCHC: 30.9 g/dL (ref 30.0–36.0)
MCV: 88.4 fL (ref 80.0–100.0)
Platelets: 137 10*3/uL — ABNORMAL LOW (ref 150–400)
RBC: 4.47 MIL/uL (ref 3.87–5.11)
RDW: 15.9 % — ABNORMAL HIGH (ref 11.5–15.5)
WBC: 8.6 10*3/uL (ref 4.0–10.5)
nRBC: 0 % (ref 0.0–0.2)

## 2020-05-31 LAB — LIPID PANEL
Cholesterol: 171 mg/dL (ref 0–200)
HDL: 54 mg/dL (ref >40)
LDL Cholesterol: 103 mg/dL — ABNORMAL HIGH (ref 0–99)
Total CHOL/HDL Ratio: 3.2 RATIO
Triglycerides: 69 mg/dL (ref <150)
VLDL: 14 mg/dL (ref 0–40)

## 2020-05-31 LAB — CREATININE, SERUM
Creatinine, Ser: 0.82 mg/dL (ref 0.44–1.00)
GFR, Estimated: >60 mL/min (ref >60)

## 2020-05-31 LAB — ECHOCARDIOGRAM COMPLETE
Area-P 1/2: 3.27 cm2
Height: 62 in
S' Lateral: 3.3 cm
Weight: 2709.01 oz

## 2020-05-31 LAB — HEMOGLOBIN A1C
Hgb A1c MFr Bld: 6.2 % — ABNORMAL HIGH (ref 4.8–5.6)
Mean Plasma Glucose: 131.24 mg/dL

## 2020-05-31 MED ORDER — ATORVASTATIN CALCIUM 10 MG PO TABS
20.0000 mg | ORAL_TABLET | Freq: Every day | ORAL | Status: DC
  Administered 2020-05-31 – 2020-06-05 (×6): 20 mg via ORAL
  Filled 2020-05-31 (×6): qty 2

## 2020-05-31 NOTE — Evaluation (Signed)
Physical Therapy Evaluation Patient Details Name: Katrina Randolph MRN: [id number] DOB: 15-Oct-1930 Today's Date: 05/31/2020   History of Present Illness  This 87 y.o. female admitted with AMS, decreased balance, and speech difficulties.  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  PMH includes: A-fib, HTN, and anemia  Clinical Impression  Pt admitted secondary to problem above with deficits below. Presenting with weakness, decreased sensation and proprioception in RLE. Also presented with expressive difficulty and requiring increased time for word finding. Pt with 2 major LOB to the R requiring mod to max A +2 for steadying. Unsteady and requiring min to mod A otherwise. Pt was previously very independent with mobility tasks. Recommending CIR level therapies to maximize functional mobility independence and safety. Will continue to follow acutely.     Follow Up Recommendations CIR    Equipment Recommendations  Other (comment) (TBD pending progression)    Recommendations for Other Services Rehab consult     Precautions / Restrictions Precautions Precautions: Fall Restrictions Weight Bearing Restrictions: No      Mobility  Bed Mobility Overal bed mobility: Needs Assistance Bed Mobility: Supine to Sit     Supine to sit: Supervision     General bed mobility comments: Supervision for safety. With dynamic sitting balance, required min A    Transfers Overall transfer level: Needs assistance Equipment used: 2 person hand held assist Transfers: Sit to/from Stand Sit to Stand: Min assist;+2 physical assistance;+2 safety/equipment         General transfer comment: Min A for steadying to stand.  Ambulation/Gait Ambulation/Gait assistance: Min assist;Mod assist;+2 physical assistance;Max assist Gait Distance (Feet): 20 Feet Assistive device: 1 person hand held assist;None Gait  Pattern/deviations: Step-through pattern;Decreased stride length;Staggering right Gait velocity: Decreased   General Gait Details: Pt tended to drag RLE at times and with decreased awareness of where RLE was in space. 2 major LOB to the R noted, one specifically when turning. Requiring mod to max A +2 for stability during LOB. Otherwise requiring min to mod A for steadying.  Stairs            Wheelchair Mobility    Modified Rankin (Stroke Patients Only) Modified Rankin (Stroke Patients Only) Pre-Morbid Rankin Score: No symptoms Modified Rankin: Moderately severe disability     Balance Overall balance assessment: Needs assistance Sitting-balance support: No upper extremity supported;Feet supported Sitting balance-Leahy Scale: Fair Sitting balance - Comments: Required min A for dynamic balance.   Standing balance support: No upper extremity supported;During functional activity Standing balance-Leahy Scale: Poor Standing balance comment: LOB X2; requiring external support                             Pertinent Vitals/Pain Pain Assessment: Faces Faces Pain Scale: Hurts a little bit Pain Location: Rt side Pain Descriptors / Indicators: Grimacing;Guarding Pain Intervention(s): Limited activity within patient's tolerance;Monitored during session;Repositioned    Home Living Family/patient expects to be discharged to:: Private residence Living Arrangements: Alone Available Help at Discharge: Family;Available PRN/intermittently Type of Home: House Home Access: Stairs to enter Entrance Stairs-Rails: None Entrance Stairs-Number of Steps: 1 Home Layout: One level Home Equipment: Walker - 4 wheels;Cane - single point;Shower seat - built in Additional Comments: daughter lives 2 minutes away from patient.     Prior Function Level of Independence: Independent with assistive device(s)         Comments: pt stands in  shower, has grab bars and a bench but doesn't use  it. She is independent with homemaking/ADL's and does not alway use rollator to mobilize. Drives, grocery shops     Hand Dominance   Dominant Hand: Right    Extremity/Trunk Assessment   Upper Extremity Assessment Upper Extremity Assessment: Defer to OT evaluation RUE Deficits / Details: mild weakness and mild sensory deficits Rt UE RUE Sensation: decreased proprioception RUE Coordination: decreased fine motor    Lower Extremity Assessment Lower Extremity Assessment: RLE deficits/detail RLE Deficits / Details: Mild weakness noted, however, could not tell where PT was touching when performing sensory tests. Decreased proprioception and had difficulty telling where RLE was in space during mobility RLE Sensation: decreased proprioception;decreased light touch RLE Coordination: decreased gross motor;decreased fine motor    Cervical / Trunk Assessment Cervical / Trunk Assessment: Normal  Communication   Communication: Expressive difficulties  Cognition Arousal/Alertness: Awake/alert Behavior During Therapy: WFL for tasks assessed/performed Overall Cognitive Status: Difficult to assess                                 General Comments: Pt follows commands well      General Comments General comments (skin integrity, edema, etc.): Pt's daughter present during session    Exercises     Assessment/Plan    PT Assessment Patient needs continued PT services  PT Problem List Decreased strength;Decreased activity tolerance;Decreased balance;Decreased mobility;Decreased knowledge of use of DME;Decreased knowledge of precautions;Impaired sensation       PT Treatment Interventions DME instruction;Gait training;Functional mobility training;Therapeutic exercise;Therapeutic activities;Stair training;Balance training;Patient/family education;Neuromuscular re-education    PT Goals (Current goals can be found in the Care Plan section)  Acute Rehab PT Goals Patient Stated Goal:  to get stronger prior to return home PT Goal Formulation: With patient/family Time For Goal Achievement: 06/14/20 Potential to Achieve Goals: Good    Frequency Min 4X/week   Barriers to discharge        Co-evaluation PT/OT/SLP Co-Evaluation/Treatment: Yes Reason for Co-Treatment: To address functional/ADL transfers;For patient/therapist safety PT goals addressed during session: Mobility/safety with mobility;Balance OT goals addressed during session: ADL's and self-care       AM-PAC PT "6 Clicks" Mobility  Outcome Measure Help needed turning from your back to your side while in a flat bed without using bedrails?: A Little Help needed moving from lying on your back to sitting on the side of a flat bed without using bedrails?: A Little Help needed moving to and from a bed to a chair (including a wheelchair)?: A Little Help needed standing up from a chair using your arms (e.g., wheelchair or bedside chair)?: A Little Help needed to walk in hospital room?: A Lot Help needed climbing 3-5 steps with a railing? : Total 6 Click Score: 15    End of Session Equipment Utilized During Treatment: Gait belt Activity Tolerance: Patient tolerated treatment well Patient left: in chair;with call bell/phone within reach;with chair alarm set;with family/visitor present Nurse Communication: Mobility status PT Visit Diagnosis: Unsteadiness on feet (R26.81);Muscle weakness (generalized) (M62.81);Difficulty in walking, not elsewhere classified (R26.2);Other symptoms and signs involving the nervous system (H47.654)    Time: 1324-1350 PT Time Calculation (min) (ACUTE ONLY): 26 min   Charges:   PT Evaluation $PT Eval Moderate Complexity: 1 Mod          Reuel Derby, PT, DPT  Acute Rehabilitation Services  Pager: [phone] Office: [phone]   Anette Guarneri  Luria Rosario 05/31/2020, 3:55 PM

## 2020-05-31 NOTE — Evaluation (Signed)
Clinical/Bedside Swallow Evaluation Patient Details  Name: Katrina Randolph MRN: [id number] Date of Birth: [birth year]  Today's Date: 05/31/2020 Time: SLP Start Time (ACUTE ONLY): 1358 SLP Stop Time (ACUTE ONLY): 1420 SLP Time Calculation (min) (ACUTE ONLY): 22 min  Past Medical History:  Past Medical History:  Diagnosis Date  . Atrial fibrillation [facility])    Past Surgical History:  Past Surgical History:  Procedure Laterality Date  . ABDOMINAL HYSTERECTOMY    . BIOPSY  10/19/2019   Procedure: BIOPSY;  Surgeon: [name], MD;  Location: [facility];  Service: Gastroenterology;;  . BREAST LUMPECTOMY    . CHOLECYSTECTOMY    . COLONOSCOPY WITH PROPOFOL N/A 10/19/2019   Procedure: COLONOSCOPY WITH PROPOFOL;  Surgeon: [name], MD;  Location: [facility];  Service: Gastroenterology;  Laterality: N/A;  . ESOPHAGOGASTRODUODENOSCOPY (EGD) WITH PROPOFOL N/A 10/19/2019   Procedure: ESOPHAGOGASTRODUODENOSCOPY (EGD) WITH PROPOFOL;  Surgeon: [name], MD;  Location: [facility];  Service: Gastroenterology;  Laterality: N/A;  . POLYPECTOMY  10/19/2019   Procedure: POLYPECTOMY;  Surgeon: [name], MD;  Location: [facility];  Service: Gastroenterology;;   HPI:  Pt is an 87 y.o. female with a PMHx of permanent atrial fibrillation with RVT not on AC, HTN, HLD not on statin, orthostatic hypotension, symptomatic recurrent anemia, GERD, depression, breast cancer, hypothyroidism,  who presented to the [facility] ED with a chief complaint of inability to communicate effectively and decreased sensation on her right side. Daughter also noted some difficulty walking and right sided weakness. She was out of the time window for tPA at the time of arrival to the ED. MRI brain revealed left posterior MCA territory acute infarctions with occlusion of multiple left M2 and M2/M3 branches.  Chest CT (05/30/20) revealed "Diffuse pulmonary vascular congestion with hazy and streaky opacities in the  mid to lower lungs and more patchy, coalescent opacity in the retrocardiac space. Findings may reflect some developing edema and/or atelectasis. Infection less favored though not fully excluded in the appropriate clinical context. Per NP note (05/31/20), "passed nursing bedside swallow but coughing during my visit today with liquids". BSE requested.   Assessment / Plan / Recommendation Clinical Impression  Pt seen for bedside swallow evaluation with family member present. Oral mechanism examination WFL and pt able to elicit strong cough. It should be noted that prior to any PO trials, pt spontaneously coughed and stated that she has hx of GERD, which often causes her to cough. Family member confirmed this as well. Thin liquids via small cup and straw sips resulted in no overt s/sx of aspiration, however consecutive straw sips during 3oz water test resulted in immediate coughing. Pureed and complex regular textured solids consumed without clincial indications of oropharygeal dysphagia. Spoke to RN who reports that pt has been swallowing pills whole (several at once) with thin liquids without difficulty. Given inconsistency of clincial s/sx with thin liquid POs, recommend pt remain on regular, thin liquid diet with staff to provide intermittent supervision to cue pt for small straw/cup sips of thin liquids and consume POs at slow rate. SLP to f/u for tolerance.   SLP Visit Diagnosis: Dysphagia, unspecified (R13.10)    Aspiration Risk  Mild aspiration risk    Diet Recommendation Regular;Thin liquid   Liquid Administration via: Straw;Cup Medication Administration: Whole meds with liquid Supervision: Intermittent supervision to cue for compensatory strategies;Patient able to self feed Compensations: Slow rate;Small sips/bites Postural Changes: Seated upright at 90 degrees    Other  Recommendations Oral Care Recommendations: Oral care BID  Follow up Recommendations  (TBD)      Frequency and  Duration min 2x/week  2 weeks       Prognosis Prognosis for Safe Diet Advancement: Good      Swallow Study   General Date of Onset: 05/30/20 HPI: Pt is an 87 y.o. female with a PMHx of permanent atrial fibrillation with RVT not on AC, HTN, HLD not on statin, orthostatic hypotension, symptomatic recurrent anemia, GERD, depression, breast cancer, hypothyroidism,  who presented to the [facility] ED with a chief complaint of inability to communicate effectively and decreased sensation on her right side. Daughter also noted some difficulty walking and right sided weakness. She was out of the time window for tPA at the time of arrival to the ED. MRI brain revealed left posterior MCA territory acute infarctions with occlusion of multiple left M2 and M2/M3 branches.  Chest CT (05/30/20) revealed "Diffuse pulmonary vascular congestion with hazy and streaky opacities in the mid to lower lungs and more patchy, coalescent opacity in the retrocardiac space. Findings may reflect some developing edema and/or atelectasis. Infection less favored though not fully excluded in the appropriate clinical context. Per NP note (05/31/20), "passed nursing bedside swallow but coughing during my visit today with liquids". BSE requested. Type of Study: Bedside Swallow Evaluation Previous Swallow Assessment: none Diet Prior to this Study: Regular;Thin liquids Temperature Spikes Noted: No Respiratory Status: Room air History of Recent Intubation: No Behavior/Cognition: Alert;Cooperative;Pleasant mood;Requires cueing Oral Cavity Assessment: Within Functional Limits Oral Care Completed by SLP: No Oral Cavity - Dentition: Adequate natural dentition Vision: Functional for self-feeding Self-Feeding Abilities: Able to feed self;Needs set up Patient Positioning: Upright in bed;Postural control adequate for testing Baseline Vocal Quality: Normal Volitional Cough: Strong Volitional Swallow: Able to elicit    Oral/Motor/Sensory Function  Overall Oral Motor/Sensory Function: Within functional limits   Ice Chips Ice chips: Not tested   Thin Liquid Thin Liquid: Impaired Presentation: Cup;Straw;Self Fed Pharyngeal  Phase Impairments: Cough - Immediate    Nectar Thick Nectar Thick Liquid: Not tested   Honey Thick Honey Thick Liquid: Not tested   Puree Puree: Within functional limits   Solid     Solid: Within functional limits     Ellwood Dense, [place], [place] Office Number: [phone]  Acie Fredrickson 05/31/2020,3:26 PM

## 2020-05-31 NOTE — Progress Notes (Signed)
Pt. States headache pain on scale of 0-10 it is a 7.  See MAR.

## 2020-05-31 NOTE — Evaluation (Addendum)
Speech Language Pathology Evaluation Patient Details Name: Katrina Randolph MRN: [id number] DOB: October 08, 1930 Today's Date: 05/31/2020 Time: 02-1438 SLP Time Calculation (min) (ACUTE ONLY): 19 min  Problem List:  Patient Active Problem List   Diagnosis Date Noted  . CVA (cerebral vascular accident) ([place]) 05/30/2020  . Orthostatic hypotension 01/26/2020  . Essential hypertension 01/26/2020  . Acute respiratory failure ([place]) 10/19/2019  . Colon polyp 10/19/2019  . Orthostatic dizziness 10/19/2019  . Hypothyroidism 10/19/2019  . Rapid atrial fibrillation ([place]) 10/12/2019  . Pneumonia 10/12/2019  . Anemia requiring transfusions 10/12/2019  . DNR (do not resuscitate) 10/12/2019  . Acquired thrombophilia ([place]) 10/12/2019  . Iron deficiency anemia 11/24/2017  . Paroxysmal atrial fibrillation ([place]) 11/24/2017  . Chronic depression 11/24/2017  . GERD (gastroesophageal reflux disease) 11/24/2017  . Breast cancer ([place]) 11/24/2017  . Hypothyroid 11/24/2017  . Dyslipidemia 11/24/2017   Past Medical History:  Past Medical History:  Diagnosis Date  . Atrial fibrillation [facility])    Past Surgical History:  Past Surgical History:  Procedure Laterality Date  . ABDOMINAL HYSTERECTOMY    . BIOPSY  10/19/2019   Procedure: BIOPSY;  Surgeon: [name], MD;  Location: [facility];  Service: Gastroenterology;;  . BREAST LUMPECTOMY    . CHOLECYSTECTOMY    . COLONOSCOPY WITH PROPOFOL N/A 10/19/2019   Procedure: COLONOSCOPY WITH PROPOFOL;  Surgeon: [name], MD;  Location: [facility];  Service: Gastroenterology;  Laterality: N/A;  . ESOPHAGOGASTRODUODENOSCOPY (EGD) WITH PROPOFOL N/A 10/19/2019   Procedure: ESOPHAGOGASTRODUODENOSCOPY (EGD) WITH PROPOFOL;  Surgeon: [name], MD;  Location: [facility];  Service: Gastroenterology;  Laterality: N/A;  . POLYPECTOMY  10/19/2019   Procedure: POLYPECTOMY;  Surgeon: [name], MD;  [facility]: [facility];  Service:  Gastroenterology;;   HPI:  Pt is an 87 y.o. female with a PMHx of permanent atrial fibrillation with RVT not on AC, HTN, HLD not on statin, orthostatic hypotension, symptomatic recurrent anemia, GERD, depression, breast cancer, hypothyroidism,  who presented to the [facility] ED with a chief complaint of inability to communicate effectively and decreased sensation on her right side. Daughter also noted some difficulty walking and right sided weakness. She was out of the time window for tPA at the time of arrival to the ED. MRI brain revealed left posterior MCA territory acute infarctions with occlusion of multiple left M2 and M2/M3 branches.  Chest CT (05/30/20) revealed "Diffuse pulmonary vascular congestion with hazy and streaky opacities in the mid to lower lungs and more patchy, coalescent opacity in the retrocardiac space. Findings may reflect some developing edema and/or atelectasis. Infection less favored though not fully excluded in the appropriate clinical context. Per NP note (05/31/20), "passed nursing bedside swallow but coughing during my visit today with liquids". BSE requested in addition to SLE.   Assessment / Plan / Recommendation Clinical Impression  Pt presents with receptive and expressive aphasia with functional motor speech and cognitive communication skills for tasks assessed this date. Pt able to follow basic 1-2 step commands, answer basic- moderate yes/no questions and follow simple conversation. Her difficulty arises when more complex commands, questions , etc are presented to her and she benefits from breaking down of multistep commands and repetition/simplification of complex yes/no questions to increase comprehension. Pt participated in converation using simple, but halting phrases c/b word finding difficulty and some phonemic paraphasias. Confrontational naming mildly impaired (9/10). She benefits from phonemic and sentence completion cues during word finding difficulty. SLP services  warranted to f/u to address receptive and expressive language in order  to increase pt ability to express basic wants/needs/ideas.    SLP Assessment  SLP Recommendation/Assessment: Patient needs continued Speech Lanaguage Pathology Services SLP Visit Diagnosis: Aphasia (R47.01)    Follow Up Recommendations   (TBD)    Frequency and Duration min 2x/week  2 weeks      SLP Evaluation Cognition  Overall Cognitive Status: Difficult to assess Arousal/Alertness: Awake/alert Orientation Level: Oriented X4 Attention: Sustained;Selective Sustained Attention: Appears intact Selective Attention: Appears intact Memory: Appears intact Safety/Judgment: Appears intact       Comprehension  Auditory Comprehension Overall Auditory Comprehension: Impaired Yes/No Questions: Impaired Complex Questions: 75-100% accurate Paragraph Comprehension (via yes/no questions): 76-100% accurate Commands: Impaired Two Step Basic Commands: 50-74% accurate Conversation: Simple EffectiveTechniques: Extra processing time;Repetition Visual Recognition/Discrimination Discrimination: Not tested Reading Comprehension Reading Status: Not tested    Expression Expression Primary Mode of Expression: Verbal Verbal Expression Overall Verbal Expression: Impaired Initiation: Impaired Automatic Speech: Name;Social Response;Counting;Month of year Level of Generative/Spontaneous Verbalization: Phrase Repetition: Impaired Level of Impairment: Phrase level Naming: Impairment Responsive: Not tested Confrontation: Impaired Convergent: 75-100% accurate Divergent: 50-74% accurate Other Naming Comments:  (Object description/function: 100%) Verbal Errors: Phonemic paraphasias;Aware of errors Pragmatics: No impairment Effective Techniques: Sentence completion;Phonemic cues Written Expression Dominant Hand: Right Written Expression: Not tested   Oral / Motor  Oral Motor/Sensory Function Overall Oral Motor/Sensory  Function: Within functional limits Motor Speech Overall Motor Speech: Appears within functional limits for tasks assessed Respiration: Within functional limits Phonation: Normal Resonance: Within functional limits Articulation: Within functional limitis Intelligibility: Intelligible Motor Planning: Witnin functional limits   GO                   Ellwood Dense, [place], [place] Office Number: [phone]  Acie Fredrickson 05/31/2020, 3:58 PM

## 2020-05-31 NOTE — Progress Notes (Signed)
PROGRESS NOTE    Katrina Randolph  YHC:[id number] DOB: May 18, 1930 DOA: 05/30/2020 PCP: [name], PA-C   Brief Narrative:  Katrina Randolph is a 87 y.o. female with a known history of atrial fibrillation not on anticoagulation, hypothyroidism presents to the emergency department for evaluation of altered mental status.  Patient was in a usual state of health until 18 hours prior to arrival she was last seen normal.  She states that she lost her balance and fell.  She subsequently reported right upper extremity and right lower extremity weakness associated with difficulty with word finding, mild confusion. Events were unwitnessed.  Patient called her daughter.  She lives alone, normally functionally independent, ambulates without assistance. At present patient reports difficulty with word finding, right sided weakness and decreased sensation on the right. Otherwise there has been no change in status. Patient has been taking medication as prescribed and there has been no recent change in medication or diet.  No recent antibiotics. There has been no recent illness, hospitalizations, travel or sick contacts.  Assessment & Plan:   Active Problems:   CVA (cerebral vascular accident) ([place])   Acute left posterior MCA CVA -POA -Neurology following, appreciate insight and recommendations -Carotid - <40% -Echo pending -Lipid panel unremarkable, A1c 6.2 -Continue aspirin 81, Lipitor 20  History of atrial fibrillation - Metoprolol and Lasix on hold for permissive hypertension  Hypothyroidism - Continue levothyroxine  DVT prophylaxis: Lovenox Code Status: Full Family Communication: None present  Status is: Inpatient  Dispo: The patient is from: Home              Anticipated d/c is to: To be determined              Anticipated d/c date is: 24 to 48 hours              Patient currently not medically stable for discharge  Consultants:   Neurology  Procedures:    None  Antimicrobials:  None  Subjective: No acute issues or events overnight, feels quite well, continues ongoing work-up with neurology.  States her deficits have essentially resolved and feels back to baseline.  Objective: Vitals:   05/31/20 0055 05/31/20 0229 05/31/20 0428 05/31/20 0627  BP:  116/75 131/75 (!) 142/86  Pulse:  99 99 88  Resp:   (!) 24   Temp: (!) 96.9 F (36.1 C) 98.2 F (36.8 C) 99.2 F (37.3 C) 97.8 F (36.6 C)  TempSrc: Oral Oral Oral Oral  SpO2:  95% 96% 98%  Weight:      Height:       No intake or output data in the 24 hours ending 05/31/20 0717 Filed Weights   05/30/20 2227  Weight: 76.8 kg    Examination:  General:  Pleasantly resting in bed, No acute distress. HEENT:  Normocephalic atraumatic.  Sclerae nonicteric, noninjected.  Extraocular movements intact bilaterally. Neck:  Without mass or deformity.  Trachea is midline. Lungs:  Clear to auscultate bilaterally without rhonchi, wheeze, or rales. Heart:  Regular rate and rhythm.  Without murmurs, rubs, or gallops. Abdomen:  Soft, nontender, nondistended.  Without guarding or rebound. Extremities: Without cyanosis, clubbing, edema, or obvious deformity. Vascular:  Dorsalis pedis and posterior tibial pulses palpable bilaterally. Skin:  Warm and dry, no erythema, no ulcerations.   Data Reviewed: I have personally reviewed following labs and imaging studies  CBC: Recent Labs  Lab 05/30/20 1338 05/31/20 0022  WBC 8.1 8.6  NEUTROABS 6.6  --   HGB 12.3  12.2  HCT 40.7 39.5  MCV 90.0 88.4  PLT 196 833*   Basic Metabolic Panel: Recent Labs  Lab 05/30/20 1338 05/31/20 0022  NA 139  --   K 3.4*  --   CL 106  --   CO2 23  --   GLUCOSE 118*  --   BUN 16  --   CREATININE 0.91 0.82  CALCIUM 8.8*  --    GFR: Estimated Creatinine Clearance: 46.4 mL/min (by C-G formula based on SCr of 0.82 mg/dL). Liver Function Tests: Recent Labs  Lab 05/30/20 1338  AST 16  ALT 13  ALKPHOS 60   BILITOT 1.7*  PROT 7.3  ALBUMIN 4.0   No results for input(s): LIPASE, AMYLASE in the last 168 hours. No results for input(s): AMMONIA in the last 168 hours. Coagulation Profile: No results for input(s): INR, PROTIME in the last 168 hours. Cardiac Enzymes: No results for input(s): CKTOTAL, CKMB, CKMBINDEX, TROPONINI in the last 168 hours. BNP (last 3 results) No results for input(s): PROBNP in the last 8760 hours. HbA1C: Recent Labs    05/31/20 0022  HGBA1C 6.2*   CBG: No results for input(s): GLUCAP in the last 168 hours. Lipid Profile: Recent Labs    05/31/20 0022  CHOL 171  HDL 54  LDLCALC 103*  TRIG 69  CHOLHDL 3.2   Thyroid Function Tests: No results for input(s): TSH, T4TOTAL, FREET4, T3FREE, THYROIDAB in the last 72 hours. Anemia Panel: No results for input(s): VITAMINB12, FOLATE, FERRITIN, TIBC, IRON, RETICCTPCT in the last 72 hours. Sepsis Labs: No results for input(s): PROCALCITON, LATICACIDVEN in the last 168 hours.  Recent Results (from the past 240 hour(s))  Resp Panel by RT-PCR (Flu A&B, Covid) Nasopharyngeal Swab     Status: None   Collection Time: 05/30/20  3:29 PM   Specimen: Nasopharyngeal Swab; Nasopharyngeal(NP) swabs in vial transport medium  Result Value Ref Range Status   SARS Coronavirus 2 by RT PCR NEGATIVE NEGATIVE Final    Comment: (NOTE) SARS-CoV-2 target nucleic acids are NOT DETECTED.  The SARS-CoV-2 RNA is generally detectable in upper respiratory specimens during the acute phase of infection. The lowest concentration of SARS-CoV-2 viral copies this assay can detect is 138 copies/mL. A negative result does not preclude SARS-Cov-2 infection and should not be used as the sole basis for treatment or other patient management decisions. A negative result may occur with  improper specimen collection/handling, submission of specimen other than nasopharyngeal swab, presence of viral mutation(s) within the areas targeted by this assay, and  inadequate number of viral copies(<138 copies/mL). A negative result must be combined with clinical observations, patient history, and epidemiological information. The expected result is Negative.  Fact Sheet for Patients:  EntrepreneurPulse.com.au  Fact Sheet for Healthcare Providers:  IncredibleEmployment.be  This test is no t yet approved or cleared by the [place] FDA and  has been authorized for detection and/or diagnosis of SARS-CoV-2 by FDA under an Emergency Use Authorization (EUA). This EUA will remain  in effect (meaning this test can be used) for the duration of the COVID-19 declaration under Section 564(b)(1) of the Act, 21 U.S.C.section 360bbb-3(b)(1), unless the authorization is terminated  or revoked sooner.       Influenza A by PCR NEGATIVE NEGATIVE Final   Influenza B by PCR NEGATIVE NEGATIVE Final    Comment: (NOTE) The Xpert Xpress SARS-CoV-2/FLU/RSV plus assay is intended as an aid in the diagnosis of influenza from Nasopharyngeal swab specimens and should not be used  as a sole basis for treatment. Nasal washings and aspirates are unacceptable for Xpert Xpress SARS-CoV-2/FLU/RSV testing.  Fact Sheet for Patients: EntrepreneurPulse.com.au  Fact Sheet for Healthcare Providers: IncredibleEmployment.be  This test is not yet approved or cleared by the [place] FDA and has been authorized for detection and/or diagnosis of SARS-CoV-2 by FDA under an Emergency Use Authorization (EUA). This EUA will remain in effect (meaning this test can be used) for the duration of the COVID-19 declaration under Section 564(b)(1) of the Act, 21 U.S.C. section 360bbb-3(b)(1), unless the authorization is terminated or revoked.  Performed at [facility], [place] [address], [place], [place] [address]     Radiology Studies: DG Chest 2 View  Result Date: 05/31/2020 CLINICAL DATA:   TIA EXAM: CHEST - 2 VIEW COMPARISON:  Radiograph 10/12/2018 FINDINGS: Diffuse pulmonary vascular congestion with hazy and streaky opacities in the mid to lower lungs and more patchy, coalescent opacity in the retrocardiac space. No pneumothorax or visible effusion. Biapical pleuroparenchymal scarring. Stable cardiomegaly with a calcified, tortuous aorta. The osseous structures appear diffusely demineralized which may limit detection of small or nondisplaced fractures. No acute osseous or soft tissue abnormality. Degenerative changes are present in the imaged spine and shoulders. Telemetry leads overlie the chest. IMPRESSION: Diffuse pulmonary vascular congestion with hazy and streaky opacities in the mid to lower lungs and more patchy, coalescent opacity in the retrocardiac space. Findings may reflect some developing edema and/or atelectasis. Infection less favored though not fully excluded in the appropriate clinical context. Electronically Signed   By: Lovena Le M.D.   On: 05/31/2020 00:11   CT Head Wo Contrast  Result Date: 05/30/2020 CLINICAL DATA:  Altered mental status, last known normal 12 hours ago EXAM: CT HEAD WITHOUT CONTRAST TECHNIQUE: Contiguous axial images were obtained from the base of the skull through the vertex without intravenous contrast. COMPARISON:  None. FINDINGS: Brain: No evidence of acute large vascular territory infarction, hemorrhage, hydrocephalus, extra-axial collection or mass lesion/mass effect. Age related global parenchymal volume loss with ex vacuo dilatation of ventricular system and prominent extra-axial spaces. There are patchy subcortical and periventricular white matter hypodensities which are nonspecific but favored represent sequela of chronic ischemic small vessel white matter disease. Vascular: No hyperdense vessel. Atherosclerotic calcifications of the intracranial portions of the internal carotid and vertebral arteries. Skull: Hyperostosis frontalis. Negative for  fracture or focal lesion. Sinuses/Orbits: The paranasal sinuses and mastoid air cells are predominantly clear. Pneumatized petrous apices. Orbits are grossly unremarkable. Other: None IMPRESSION: 1. No acute intracranial findings. 2. Age related global parenchymal volume loss and chronic ischemic small vessel white matter disease. Electronically Signed   By: Dahlia Bailiff MD   On: 05/30/2020 14:34   MR ANGIO HEAD WO CONTRAST  Result Date: 05/30/2020 CLINICAL DATA:  Neuro deficit, acute stroke suspected. EXAM: MRI HEAD WITHOUT CONTRAST MRA HEAD WITHOUT CONTRAST TECHNIQUE: Multiplanar, multiecho pulse sequences of the brain and surrounding structures were obtained without intravenous contrast. Angiographic images of the head were obtained using MRA technique without contrast. COMPARISON:  None. FINDINGS: MRI HEAD FINDINGS Brain: Multiple acute infarcts in the left MCA territory, including acute infarct of the posterior left insula, left periventricular frontal lobe and left frontoparietal cortex superiorly. There is T2/FLAIR hyperintense mild edema associated with these infarcts. No substantial mass effect. No midline shift. Basal cisterns are patent. No evidence of acute hemorrhage. No extra-axial fluid collections. Additional moderate T2/FLAIR hyperintensities within the white matter and pons, nonspecific but most likely related to  chronic microvascular ischemic disease. Frontal predominant cerebral volume loss with associated prominence of the overlying extra-axial spaces. Vascular: Major arterial flow voids are maintained at the skull base. Skull and upper cervical spine: Normal marrow signal. Sinuses/Orbits: Clear sinuses.  Unremarkable orbits. Other: No sizable mastoid effusions. MRA HEAD FINDINGS Anterior circulation: Bilateral ICAs are patent through the carotid terminus. Bilateral M1 MCAs are patent. Left proximal M2 MCA inferior division is poorly visualized, but within an area of artifact. There is  occlusion of two other proximal left M2 MCA branches in the left sylvian fissure (see series 24, images 41 and 45). Additionally, there is suspected occlusion of a more distal left M2/M3 MCA branch (series 24, image 30). Smaller left A1 ACA, likely congenital. Otherwise, patent ACAs. Possible small (approximately 2 mm) outpouching in the region of the right ICA terminus. Posterior circulation: Patent intradural vertebral arteries and basilar artery. Right fetal type PCA. Patent proximal posterior cerebral arteries with suspected mild stenosis of the right P2 PCA. IMPRESSION: 1. Left posterior MCA territory acute infarcts with occlusion of multiple left M2 and M2/M3 MCA branches, as described above. Associated edema without substantial mass effect. 2. Moderate chronic microvascular ischemic disease. 3. Frontal predominant cerebral volume loss. 4. Possible small (approximately 2 mm) aneurysm in the region of the right ICA terminus. A CTA could further characterize if clinically indicated Critical/urgent findings discussed with Dr. Rory Percy at 6:14 p.m via telephone. Electronically Signed   By: Margaretha Sheffield MD   On: 05/30/2020 18:43   MR BRAIN WO CONTRAST  Result Date: 05/30/2020 CLINICAL DATA:  Neuro deficit, acute stroke suspected. EXAM: MRI HEAD WITHOUT CONTRAST MRA HEAD WITHOUT CONTRAST TECHNIQUE: Multiplanar, multiecho pulse sequences of the brain and surrounding structures were obtained without intravenous contrast. Angiographic images of the head were obtained using MRA technique without contrast. COMPARISON:  None. FINDINGS: MRI HEAD FINDINGS Brain: Multiple acute infarcts in the left MCA territory, including acute infarct of the posterior left insula, left periventricular frontal lobe and left frontoparietal cortex superiorly. There is T2/FLAIR hyperintense mild edema associated with these infarcts. No substantial mass effect. No midline shift. Basal cisterns are patent. No evidence of acute hemorrhage.  No extra-axial fluid collections. Additional moderate T2/FLAIR hyperintensities within the white matter and pons, nonspecific but most likely related to chronic microvascular ischemic disease. Frontal predominant cerebral volume loss with associated prominence of the overlying extra-axial spaces. Vascular: Major arterial flow voids are maintained at the skull base. Skull and upper cervical spine: Normal marrow signal. Sinuses/Orbits: Clear sinuses.  Unremarkable orbits. Other: No sizable mastoid effusions. MRA HEAD FINDINGS Anterior circulation: Bilateral ICAs are patent through the carotid terminus. Bilateral M1 MCAs are patent. Left proximal M2 MCA inferior division is poorly visualized, but within an area of artifact. There is occlusion of two other proximal left M2 MCA branches in the left sylvian fissure (see series 24, images 41 and 45). Additionally, there is suspected occlusion of a more distal left M2/M3 MCA branch (series 24, image 30). Smaller left A1 ACA, likely congenital. Otherwise, patent ACAs. Possible small (approximately 2 mm) outpouching in the region of the right ICA terminus. Posterior circulation: Patent intradural vertebral arteries and basilar artery. Right fetal type PCA. Patent proximal posterior cerebral arteries with suspected mild stenosis of the right P2 PCA. IMPRESSION: 1. Left posterior MCA territory acute infarcts with occlusion of multiple left M2 and M2/M3 MCA branches, as described above. Associated edema without substantial mass effect. 2. Moderate chronic microvascular ischemic disease. 3. Frontal predominant cerebral  volume loss. 4. Possible small (approximately 2 mm) aneurysm in the region of the right ICA terminus. A CTA could further characterize if clinically indicated Critical/urgent findings discussed with Dr. Rory Percy at 6:14 p.m via telephone. Electronically Signed   By: Margaretha Sheffield MD   On: 05/30/2020 18:43   Scheduled Meds: .  stroke: mapping our early stages of  recovery book   Does not apply Once  . aspirin EC  81 mg Oral Daily  . enoxaparin (LOVENOX) injection  40 mg Subcutaneous QHS  . levothyroxine  112 mcg Oral q morning  . pantoprazole  40 mg Oral Daily  . sertraline  50 mg Oral Daily     LOS: 1 day   Time spent: 60min  Kaniesha Barile C Marchello Rothgeb, DO [facility]  If 7PM-7AM, please contact night-coverage [email or web address]  05/31/2020, 7:17 AM

## 2020-05-31 NOTE — Progress Notes (Incomplete)
  Echocardiogram 2D Echocardiogram has been performed.  Katrina Randolph 05/31/2020, 3:39 PM

## 2020-05-31 NOTE — Progress Notes (Addendum)
STROKE TEAM PROGRESS NOTE   INTERVAL HISTORY No acute events overnight  She is sitting up in bed eating breakfast with some mild intermittent coughing with daughter at bedside. Expressive aphasia persists. Reception appears mostly intact. She follows all commands briskly. She is frustrated with obvious word finding difficulty. Daughter assists with filling in history and events. We discussed recent visit in January to [name] for watchman consideration which they declined. Together they indicated that she declined Watchman because husband had a bad outcome after having complications after a procedure that was supposed to be simple. They are both willing to reconsider in light of the current stroke.   She otherwise reports she is about the same as yesterday. She is still noticing diminished sensation RUE and RLE.   We discussed stroke diagnosis, ongoing work up and plan of care.  Vitals:   05/31/20 0627 05/31/20 0703 05/31/20 0757 05/31/20 1012  BP: (!) 142/86 (!) 140/98 129/77 (!) 153/62  Pulse: 88 75 91 91  Resp:  18 20 20   Temp: 97.8 F (36.6 C)     TempSrc: Oral     SpO2: 98% 96% 95% 98%  Weight:      Height:       CBC:  Recent Labs  Lab 05/30/20 1338 05/31/20 0022  WBC 8.1 8.6  NEUTROABS 6.6  --   HGB 12.3 12.2  HCT 40.7 39.5  MCV 90.0 88.4  PLT 196 509*   Basic Metabolic Panel:  Recent Labs  Lab 05/30/20 1338 05/31/20 0022  NA 139  --   K 3.4*  --   CL 106  --   CO2 23  --   GLUCOSE 118*  --   BUN 16  --   CREATININE 0.91 0.82  CALCIUM 8.8*  --    Lipid Panel:  Recent Labs  Lab 05/31/20 0022  CHOL 171  TRIG 69  HDL 54  CHOLHDL 3.2  VLDL 14  LDLCALC 103*   HgbA1c:  Recent Labs  Lab 05/31/20 0022  HGBA1C 6.2*   PHYSICAL EXAM HEENT: Katrina Randolph Lungs: Respirations unlabored Ext: No edema  Neurologic Examination: Mental Status: Alert and oriented x4. Good attention and comprehension. Follows commands briskly.  Speech remains fragmentary and  non-fluent. Names 1/2. Significant errors with 3 word repetition.  Cranial Nerves: II:  Temporal visual fields intact bilaterally. PERRL.  III,IV, VI: No ptosis. EOMI. No nystagmus.  V,VII: Smile symmetric, facial temp sensation decreased on the right VIII: Hearing intact to voice IX,X: No hypophonia XI: Symmetric XII: Midline tongue extension  Motor: RUE and RLE 5/5 LUE and LLE 5/5 No pronator drift.  Sensory: Decreased  FT sensation on the right.  Cerebellar: RAMs and arm rolling intact BUE. Got a cramp in RLE during HTS attempt and declined further testing of LE.  Gait: Deferred  ASSESSMENT/PLAN 87 y.o. female with PMH significant for permanent atrial fibrillation with RVT not on AC, HTN, HLD not on statin, orthostatic hypotension, symptomatic recurrent anemia, GERD, depression, breast cancer, hypothyroidism,  presenting with aphasia and right sided sensory loss.  Stroke - Left MCA territory stroke likely  due to known chronic atrial fibrillation off anticoagulation due to recurrent symptomatic anemia.   2D Echo is pending   Carotid Doppler  pending  Swallow status: passed nursing bedside swallow but coughing during my visit today with liquids. SLP eval ordered and pending  Stroke deficits: Aphasia and right sided diminished sensation, some coughing with fluids persist. Therapy recommendations:  PT/OT/ST cog/comm TBD, speech  for swallow added.  Continue tele   No antithrombotics PTA, currently on aspirin 81.  Will discuss with [name] regarding anticoagulation options.  Disposition:  TBD  VTE prophylaxis - Lovenox PPX  Permanent atrial fibrillation  Not on AC/AP prior to admission d/t poor candidacy related to recurrent symptomatic anemia believed to be from an unknown GI source.   [name] saw her Jan/22 for Watchman consideration. Patient decided not to pursue.   Left message for Dr. Mardene Speak office requesting advice for short term anticoagulation plan. His  response is pending  HTN . Permissive hypertension (OK if < 220/120) but gradually normalize in 5-7 days . Long-term BP goal normotensive . Hx of orthostatic hypotension and HTN  Hyperlipidemia  Home meds: None  LDL 103, not at goal < 70  High intensity statin: Lipitor 20mg  pending swallow status  Continue statin at discharge  History of symptomatic recurrent anemia of unknown etiology but contributed to GI source -hgb 12, stable  Other Stroke Risk Factors  Advanced Age >/= 68   Former Cigarette smoker  Current ETOH use, advised to drink no more than 1 drink per day  Obesity, Body mass index is 30.97 kg/m., BMI >/= 30 associated with increased stroke risk, recommend weight loss, diet and exercise as appropriate   Hospital day # 1 This plan of care was directed by Dr. Erlinda Hong.  Katrina Blend, NP-C, MSN   ATTENDING NOTE: I reviewed above note and agree with the assessment and plan. Pt was seen and examined.   87 year old female with history of A. fib not on [facility] admitted for aphasia, right sided numbness and weakness and difficulty walking.  MRI showed left MCA infarct.  MRA showed left M2 occlusion.  Carotid Doppler unremarkable.  EF 55 to 60%.  LDL 103, A1c 6.2.  Creatinine 0.82.  Patient has chronic A. fib, has been following with cardiology.  [name] consider patient not a candidate for anticoagulation given history of iron deficiency anemia presumed from GI source.  Patient was referred to Dr. Cleaster Corin for watchman device consideration in 02/2020, and patient has not decided to proceed yet.  Patient had admission in 10/2019 for A. fib RVR but also had recurrent microcytic anemia.  Underwent upper and lower GI work-up with no source of bleeding identified.  But her guaiac was positive with dark brown stools.  Protonix started.  No blood transfusion mentioned.  Prior to that, patient was following with oncology/hematology for iron deficiency anemia, received IV iron  infusion.  On exam, no family at bedside, patient awake alert, orientated, however still has expressive aphasia with frequent word finding difficulty and paraphasic errors.  Follow simple commands, able to name but was difficulty reputation.  No gaze palsy, no visual field deficit, facial symmetrical.  Patient did feel strange feeling of right arm but no weakness right arm or leg.  Sensation symmetrical, finger-to-nose intact.  Etiology for patient stroke likely due to A. fib not on AC.  Given her current stroke, and no overt GI bleeding in the past And stable hemoglobin, we are leaning towards to start anticoagulation 5 days post stroke to minimize risk of hemorrhagic conversion.  Discussed with [name] cardiology and Dr. Avon Gully attending physician, they have no objection to this plan, but recommend close monitoring.  We will further discussed with patient and her family in a.m. regarding potential anticoagulation initiation.  Currently, continue aspirin in the meantime.  Put on Lipitor 20 for HLD.  PT/OT recommend CIR.  Will follow   Katrina Hawking, MD PhD Stroke Neurology 05/31/2020 11:21 PM  We discussed with [name] and Dr Avon Gully. I spent  35 minutes in total face-to-face time with the patient, more than 50% of which was spent in counseling and coordination of care, reviewing test results, images and medication, and discussing the diagnosis, treatment plan and potential prognosis. This patient's care requiresreview of multiple databases, neurological assessment, discussion other specialists and medical decision making of high complexity.        To contact Stroke Continuity provider, please refer to [email or web address] After hours, contact General Neurology

## 2020-05-31 NOTE — Progress Notes (Signed)
  Echocardiogram 2D Echocardiogram has been attempted patient receiving therapy. Will reattempt at later time.  Randa Lynn Sarissa Dern 05/31/2020, 2:37 PM

## 2020-05-31 NOTE — Progress Notes (Signed)
PT Cancellation Note  Patient Details Name: Jessa Stinson MRN: [id number] DOB: 1930-11-06   Cancelled Treatment:    Reason Eval/Treat Not Completed: Active bedrest order Will reattempt when activity level allows.    Arby Barrette, PT Pager [phone]   Rexanne Mano 05/31/2020, 8:31 AM

## 2020-05-31 NOTE — Telephone Encounter (Signed)
Called and spoke with Delilah, NP on the stroke team.  Patient is currently admitted with ischemic stroke.  Defer decision on restarting anticoagulation to the neurology team and Dr Erlinda Hong given acute ischemic stroke and possibility of secondary hemorrhagic conversion.  If anticoagulation felt to be safe and decision is made to restart, would recommend early follow-up with blood work to confirm she is not having worsening anemia.    I will send a message to my clinic to have her scheduled for a follow-up appointment with me in approximately 3 months to revisit her anticoagulation and secondary stroke prophylaxis.  Lysbeth Galas T. Quentin Ore, MD, [facility], [facility] Cardiac Electrophysiology

## 2020-05-31 NOTE — Plan of Care (Signed)
I again called Dr. Mardene Speak office regarding the advice for anticoagulation plan. Another message was left with his staff.

## 2020-05-31 NOTE — Consult Note (Signed)
Referring Physician: Dr. Avon Gully    Chief Complaint: Aphasia  HPI: Katrina Randolph is an 87 y.o. female with a PMHx of atrial fibrillation (not on anticoagulation or antiplatelet medication as outpatient) who presented to the [facility] ED on Tuesday with a chief complaint of inability to communicate effectively and decreased sensation on her right side. Daughter also noted some difficulty walking and right sided weakness. The patient's LKN was at 1500 on Monday. She was out of the time window for tPA at the time of arrival to the ED. MRI brain revealed left posterior MCA territory acute infarctions with occlusion of multiple left M2 and M2/M3 branches.   MRI brain/MRA head: 1. Left posterior MCA territory acute infarcts with occlusion of multiple left M2 and M2/M3 MCA branches, as described above. Associated edema without substantial mass effect. 2. Moderate chronic microvascular ischemic disease. 3. Frontal predominant cerebral volume loss. 4. Possible small (approximately 2 mm) aneurysm in the region of the right ICA terminus. A CTA could further characterize if clinically indicated  LSN: 1500 Monday tPA Given: No: Out of the time window.   Past Medical History:  Diagnosis Date  . Atrial fibrillation ([place])   Hypothyroidism  Past Surgical History:  Procedure Laterality Date  . ABDOMINAL HYSTERECTOMY    . BIOPSY  10/19/2019   Procedure: BIOPSY;  Surgeon: [name], MD;  Location: [facility];  Service: Gastroenterology;;  . BREAST LUMPECTOMY    . CHOLECYSTECTOMY    . COLONOSCOPY WITH PROPOFOL N/A 10/19/2019   Procedure: COLONOSCOPY WITH PROPOFOL;  Surgeon: [name], MD;  Location: [facility];  Service: Gastroenterology;  Laterality: N/A;  . ESOPHAGOGASTRODUODENOSCOPY (EGD) WITH PROPOFOL N/A 10/19/2019   Procedure: ESOPHAGOGASTRODUODENOSCOPY (EGD) WITH PROPOFOL;  Surgeon: [name], MD;  Location: [facility];  Service: Gastroenterology;  Laterality: N/A;  .  POLYPECTOMY  10/19/2019   Procedure: POLYPECTOMY;  Surgeon: [name], MD;  Location: [facility];  Service: Gastroenterology;;    No family history on file. Social History:  reports that she has quit smoking. Her smoking use included cigarettes. She has never used smokeless tobacco. She reports current alcohol use. She reports that she does not use drugs.  Allergies:  Allergies  Allergen Reactions  . Sulfa Antibiotics     "feels bad"    Medications:  Prior to Admission:  Medications Prior to Admission  Medication Sig Dispense Refill Last Dose  . esomeprazole (NEXIUM) 20 MG capsule Take 20 mg by mouth daily.   05/29/2020 at Unknown time  . furosemide (LASIX) 20 MG tablet Take 20 mg by mouth daily as needed for fluid.    05/29/2020 at Unknown time  . levothyroxine (SYNTHROID) 112 MCG tablet Take 1 tablet (112 mcg total) by mouth every morning. 30 tablet 0 05/29/2020 at Unknown time  . metoprolol succinate (TOPROL-XL) 50 MG 24 hr tablet Take 1 tablet (50 mg total) by mouth daily. Take with or immediately following a meal. 30 tablet 1 05/29/2020 at 1200  . sertraline (ZOLOFT) 50 MG tablet Take 50 mg by mouth daily.   05/29/2020 at Unknown time  . traZODone (DESYREL) 50 MG tablet Take 50 mg by mouth at bedtime as needed for sleep.    Past Month at Unknown time   Scheduled: .  stroke: mapping our early stages of recovery book   Does not apply Once  . aspirin EC  81 mg Oral Daily  . enoxaparin (LOVENOX) injection  40 mg Subcutaneous QHS  . levothyroxine  112 mcg Oral q morning  .  pantoprazole  40 mg Oral Daily  . sertraline  50 mg Oral Daily    ROS: As per HPI. Unable to obtain detailed ROS due to expressive and receptive dysphasia.   Physical Examination: Blood pressure (!) 142/86, pulse 88, temperature 97.8 F (36.6 C), temperature source Oral, resp. rate (!) 24, height 5\' 2"  (1.575 m), weight 76.8 kg, SpO2 98 %.  HEENT: [place]/AT Lungs: Respirations unlabored Ext: No  edema  Neurologic Examination: Mental Status: Awake and alert with expressive and receptive dysphasia. Good attention. Phonemic paraphasias noted. Speech is fragmentary and non-fluent. Has naming difficulty. Mild difficulty with repetition. Has difficulty with comprehension of several commands. Could understand several questions but with others she appeared confused. No dysarthria. Oriented to self, circumstance, location and year. Could not remember how to say April and could not recall the day of the week.  Cranial Nerves: II:  Temporal visual fields intact bilaterally. PERRL.  III,IV, VI: No ptosis. EOMI. No nystagmus.  V,VII: Smile symmetric, facial temp sensation decreased on the right VIII: Hearing intact to voice IX,X: No hypophonia XI: Symmetric XII: Midline tongue extension  Motor: RUE and RLE 5/5 LUE and LLE 5/5 No pronator drift.  Sensory: Decreased temp and FT sensation on the right.  Deep Tendon Reflexes:  Unremarkable.  Cerebellar: No ataxia with FNF bilaterally  Gait: Deferred  Results for orders placed or performed during the hospital encounter of 05/30/20 (from the past 48 hour(s))  Urinalysis, Routine w reflex microscopic Urine, Clean Catch     Status: Abnormal   Collection Time: 05/30/20  1:32 PM  Result Value Ref Range   Color, Urine AMBER (A) YELLOW    Comment: BIOCHEMICALS MAY BE AFFECTED BY COLOR   APPearance CLEAR CLEAR   Specific Gravity, Urine 1.023 1.005 - 1.030   pH 5.0 5.0 - 8.0   Glucose, UA NEGATIVE NEGATIVE mg/dL   Hgb urine dipstick SMALL (A) NEGATIVE   Bilirubin Urine NEGATIVE NEGATIVE   Ketones, ur 5 (A) NEGATIVE mg/dL   Protein, ur 30 (A) NEGATIVE mg/dL   Nitrite NEGATIVE NEGATIVE   Leukocytes,Ua NEGATIVE NEGATIVE   RBC / HPF 11-20 0 - 5 RBC/hpf   WBC, UA 0-5 0 - 5 WBC/hpf   Bacteria, UA FEW (A) NONE SEEN   Squamous Epithelial / LPF 0-5 0 - 5   Mucus PRESENT    Hyaline Casts, UA PRESENT     Comment: Performed at [facility], [place] [address] [place], [place] [address]  CBC with Differential/Platelet     Status: Abnormal   Collection Time: 05/30/20  1:38 PM  Result Value Ref Range   WBC 8.1 4.0 - 10.5 K/uL   RBC 4.52 3.87 - 5.11 MIL/uL   Hemoglobin 12.3 12.0 - 15.0 g/dL   HCT 40.7 36.0 - 46.0 %   MCV 90.0 80.0 - 100.0 fL   MCH 27.2 26.0 - 34.0 pg   MCHC 30.2 30.0 - 36.0 g/dL   RDW 15.9 (H) 11.5 - 15.5 %   Platelets 196 150 - 400 K/uL   nRBC 0.0 0.0 - 0.2 %   Neutrophils Relative % 82 %   Neutro Abs 6.6 1.7 - 7.7 K/uL   Lymphocytes Relative 7 %   Lymphs Abs 0.5 (L) 0.7 - 4.0 K/uL   Monocytes Relative 8 %   Monocytes Absolute 0.7 0.1 - 1.0 K/uL   Eosinophils Relative 2 %   Eosinophils Absolute 0.2 0.0 - 0.5 K/uL   Basophils Relative 1 %  Basophils Absolute 0.1 0.0 - 0.1 K/uL   Immature Granulocytes 0 %   Abs Immature Granulocytes 0.02 0.00 - 0.07 K/uL    Comment: Performed at [facility], [place] [address], [place], [place] [address]  Comprehensive metabolic panel     Status: Abnormal   Collection Time: 05/30/20  1:38 PM  Result Value Ref Range   Sodium 139 135 - 145 mmol/L   Potassium 3.4 (L) 3.5 - 5.1 mmol/L   Chloride 106 98 - 111 mmol/L   CO2 23 22 - 32 mmol/L   Glucose, Bld 118 (H) 70 - 99 mg/dL    Comment: Glucose reference range applies only to samples taken after fasting for at least 8 hours.   BUN 16 8 - 23 mg/dL   Creatinine, Ser 0.91 0.44 - 1.00 mg/dL   Calcium 8.8 (L) 8.9 - 10.3 mg/dL   Total Protein 7.3 6.5 - 8.1 g/dL   Albumin 4.0 3.5 - 5.0 g/dL   AST 16 15 - 41 U/L   ALT 13 0 - 44 U/L   Alkaline Phosphatase 60 38 - 126 U/L   Total Bilirubin 1.7 (H) 0.3 - 1.2 mg/dL   GFR, Estimated >60 >60 mL/min    Comment: (NOTE) Calculated using the CKD-EPI Creatinine Equation (2021)    Anion gap 10 5 - 15    Comment: Performed at [facility], [place] [address], [place], [place] [address]  Resp Panel by RT-PCR (Flu A&B, Covid) Nasopharyngeal  Swab     Status: None   Collection Time: 05/30/20  3:29 PM   Specimen: Nasopharyngeal Swab; Nasopharyngeal(NP) swabs in vial transport medium  Result Value Ref Range   SARS Coronavirus 2 by RT PCR NEGATIVE NEGATIVE    Comment: (NOTE) SARS-CoV-2 target nucleic acids are NOT DETECTED.  The SARS-CoV-2 RNA is generally detectable in upper respiratory specimens during the acute phase of infection. The lowest concentration of SARS-CoV-2 viral copies this assay can detect is 138 copies/mL. A negative result does not preclude SARS-Cov-2 infection and should not be used as the sole basis for treatment or other patient management decisions. A negative result may occur with  improper specimen collection/handling, submission of specimen other than nasopharyngeal swab, presence of viral mutation(s) within the areas targeted by this assay, and inadequate number of viral copies(<138 copies/mL). A negative result must be combined with clinical observations, patient history, and epidemiological information. The expected result is Negative.  Fact Sheet for Patients:  EntrepreneurPulse.com.au  Fact Sheet for Healthcare Providers:  IncredibleEmployment.be  This test is no t yet approved or cleared by the [place] FDA and  has been authorized for detection and/or diagnosis of SARS-CoV-2 by FDA under an Emergency Use Authorization (EUA). This EUA will remain  in effect (meaning this test can be used) for the duration of the COVID-19 declaration under Section 564(b)(1) of the Act, 21 U.S.C.section 360bbb-3(b)(1), unless the authorization is terminated  or revoked sooner.       Influenza A by PCR NEGATIVE NEGATIVE   Influenza B by PCR NEGATIVE NEGATIVE    Comment: (NOTE) The Xpert Xpress SARS-CoV-2/FLU/RSV plus assay is intended as an aid in the diagnosis of influenza from Nasopharyngeal swab specimens and should not be used as a sole basis for treatment.  Nasal washings and aspirates are unacceptable for Xpert Xpress SARS-CoV-2/FLU/RSV testing.  Fact Sheet for Patients: EntrepreneurPulse.com.au  Fact Sheet for Healthcare Providers: IncredibleEmployment.be  This test is not yet approved or cleared by the [place]  States FDA and has been authorized for detection and/or diagnosis of SARS-CoV-2 by FDA under an Emergency Use Authorization (EUA). This EUA will remain in effect (meaning this test can be used) for the duration of the COVID-19 declaration under Section 564(b)(1) of the Act, 21 U.S.C. section 360bbb-3(b)(1), unless the authorization is terminated or revoked.  Performed at [facility], [place] [address] [place], [place] [address]   Hemoglobin A1c     Status: Abnormal   Collection Time: 05/31/20 12:22 AM  Result Value Ref Range   Hgb A1c MFr Bld 6.2 (H) 4.8 - 5.6 %    Comment: (NOTE) Pre diabetes:          5.7%-6.4%  Diabetes:              >6.4%  Glycemic control for   <7.0% adults with diabetes    Mean Plasma Glucose 131.24 mg/dL    Comment: Performed at [place] [address] [place], [place] [address]  Lipid panel     Status: Abnormal   Collection Time: 05/31/20 12:22 AM  Result Value Ref Range   Cholesterol 171 0 - 200 mg/dL   Triglycerides 69 <150 mg/dL   HDL 54 >40 mg/dL   Total CHOL/HDL Ratio 3.2 RATIO   VLDL 14 0 - 40 mg/dL   LDL Cholesterol 103 (H) 0 - 99 mg/dL    Comment:        Total Cholesterol/HDL:CHD Risk Coronary Heart Disease Risk Table                     Men   Women  1/2 Average Risk   3.4   3.3  Average Risk       5.0   4.4  2 X Average Risk   9.6   7.1  3 X Average Risk  23.4   11.0        Use the calculated Patient Ratio above and the CHD Risk Table to determine the patient's CHD Risk.        ATP III CLASSIFICATION (LDL):  <100     mg/dL   Optimal  100-129  mg/dL   Near or Above                    Optimal  130-159   mg/dL   Borderline  160-189  mg/dL   High  >190     mg/dL   Very High Performed at [place] [address] [place], [place] [address]   CBC     Status: Abnormal   Collection Time: 05/31/20 12:22 AM  Result Value Ref Range   WBC 8.6 4.0 - 10.5 K/uL   RBC 4.47 3.87 - 5.11 MIL/uL   Hemoglobin 12.2 12.0 - 15.0 g/dL   HCT 39.5 36.0 - 46.0 %   MCV 88.4 80.0 - 100.0 fL   MCH 27.3 26.0 - 34.0 pg   MCHC 30.9 30.0 - 36.0 g/dL   RDW 15.9 (H) 11.5 - 15.5 %   Platelets 137 (L) 150 - 400 K/uL   nRBC 0.0 0.0 - 0.2 %    Comment: Performed at [facility], [place] [address], [place],  [address]  Creatinine, serum     Status: None   Collection Time: 05/31/20 12:22 AM  Result Value Ref Range   Creatinine, Ser 0.82 0.44 - 1.00 mg/dL   GFR, Estimated >60 >60 mL/min    Comment: (NOTE)  Calculated using the CKD-EPI Creatinine Equation (2021) Performed at [facility], [place] [address], [place], [place] [address]    DG Chest 2 View  Result Date: 05/31/2020 CLINICAL DATA:  TIA EXAM: CHEST - 2 VIEW COMPARISON:  Radiograph 10/12/2018 FINDINGS: Diffuse pulmonary vascular congestion with hazy and streaky opacities in the mid to lower lungs and more patchy, coalescent opacity in the retrocardiac space. No pneumothorax or visible effusion. Biapical pleuroparenchymal scarring. Stable cardiomegaly with a calcified, tortuous aorta. The osseous structures appear diffusely demineralized which may limit detection of small or nondisplaced fractures. No acute osseous or soft tissue abnormality. Degenerative changes are present in the imaged spine and shoulders. Telemetry leads overlie the chest. IMPRESSION: Diffuse pulmonary vascular congestion with hazy and streaky opacities in the mid to lower lungs and more patchy, coalescent opacity in the retrocardiac space. Findings may reflect some developing edema and/or atelectasis. Infection less favored though not fully excluded in the appropriate  clinical context. Electronically Signed   By: Lovena Le M.D.   On: 05/31/2020 00:11   CT Head Wo Contrast  Result Date: 05/30/2020 CLINICAL DATA:  Altered mental status, last known normal 12 hours ago EXAM: CT HEAD WITHOUT CONTRAST TECHNIQUE: Contiguous axial images were obtained from the base of the skull through the vertex without intravenous contrast. COMPARISON:  None. FINDINGS: Brain: No evidence of acute large vascular territory infarction, hemorrhage, hydrocephalus, extra-axial collection or mass lesion/mass effect. Age related global parenchymal volume loss with ex vacuo dilatation of ventricular system and prominent extra-axial spaces. There are patchy subcortical and periventricular white matter hypodensities which are nonspecific but favored represent sequela of chronic ischemic small vessel white matter disease. Vascular: No hyperdense vessel. Atherosclerotic calcifications of the intracranial portions of the internal carotid and vertebral arteries. Skull: Hyperostosis frontalis. Negative for fracture or focal lesion. Sinuses/Orbits: The paranasal sinuses and mastoid air cells are predominantly clear. Pneumatized petrous apices. Orbits are grossly unremarkable. Other: None IMPRESSION: 1. No acute intracranial findings. 2. Age related global parenchymal volume loss and chronic ischemic small vessel white matter disease. Electronically Signed   By: Dahlia Bailiff MD   On: 05/30/2020 14:34   MR ANGIO HEAD WO CONTRAST  Result Date: 05/30/2020 CLINICAL DATA:  Neuro deficit, acute stroke suspected. EXAM: MRI HEAD WITHOUT CONTRAST MRA HEAD WITHOUT CONTRAST TECHNIQUE: Multiplanar, multiecho pulse sequences of the brain and surrounding structures were obtained without intravenous contrast. Angiographic images of the head were obtained using MRA technique without contrast. COMPARISON:  None. FINDINGS: MRI HEAD FINDINGS Brain: Multiple acute infarcts in the left MCA territory, including acute infarct of  the posterior left insula, left periventricular frontal lobe and left frontoparietal cortex superiorly. There is T2/FLAIR hyperintense mild edema associated with these infarcts. No substantial mass effect. No midline shift. Basal cisterns are patent. No evidence of acute hemorrhage. No extra-axial fluid collections. Additional moderate T2/FLAIR hyperintensities within the white matter and pons, nonspecific but most likely related to chronic microvascular ischemic disease. Frontal predominant cerebral volume loss with associated prominence of the overlying extra-axial spaces. Vascular: Major arterial flow voids are maintained at the skull base. Skull and upper cervical spine: Normal marrow signal. Sinuses/Orbits: Clear sinuses.  Unremarkable orbits. Other: No sizable mastoid effusions. MRA HEAD FINDINGS Anterior circulation: Bilateral ICAs are patent through the carotid terminus. Bilateral M1 MCAs are patent. Left proximal M2 MCA inferior division is poorly visualized, but within an area of artifact. There is occlusion of two other proximal left M2 MCA branches in the left sylvian  fissure (see series 24, images 41 and 45). Additionally, there is suspected occlusion of a more distal left M2/M3 MCA branch (series 24, image 30). Smaller left A1 ACA, likely congenital. Otherwise, patent ACAs. Possible small (approximately 2 mm) outpouching in the region of the right ICA terminus. Posterior circulation: Patent intradural vertebral arteries and basilar artery. Right fetal type PCA. Patent proximal posterior cerebral arteries with suspected mild stenosis of the right P2 PCA. IMPRESSION: 1. Left posterior MCA territory acute infarcts with occlusion of multiple left M2 and M2/M3 MCA branches, as described above. Associated edema without substantial mass effect. 2. Moderate chronic microvascular ischemic disease. 3. Frontal predominant cerebral volume loss. 4. Possible small (approximately 2 mm) aneurysm in the region of the  right ICA terminus. A CTA could further characterize if clinically indicated Critical/urgent findings discussed with Dr. Rory Percy at 6:14 p.m via telephone. Electronically Signed   By: Margaretha Sheffield MD   On: 05/30/2020 18:43   MR BRAIN WO CONTRAST  Result Date: 05/30/2020 CLINICAL DATA:  Neuro deficit, acute stroke suspected. EXAM: MRI HEAD WITHOUT CONTRAST MRA HEAD WITHOUT CONTRAST TECHNIQUE: Multiplanar, multiecho pulse sequences of the brain and surrounding structures were obtained without intravenous contrast. Angiographic images of the head were obtained using MRA technique without contrast. COMPARISON:  None. FINDINGS: MRI HEAD FINDINGS Brain: Multiple acute infarcts in the left MCA territory, including acute infarct of the posterior left insula, left periventricular frontal lobe and left frontoparietal cortex superiorly. There is T2/FLAIR hyperintense mild edema associated with these infarcts. No substantial mass effect. No midline shift. Basal cisterns are patent. No evidence of acute hemorrhage. No extra-axial fluid collections. Additional moderate T2/FLAIR hyperintensities within the white matter and pons, nonspecific but most likely related to chronic microvascular ischemic disease. Frontal predominant cerebral volume loss with associated prominence of the overlying extra-axial spaces. Vascular: Major arterial flow voids are maintained at the skull base. Skull and upper cervical spine: Normal marrow signal. Sinuses/Orbits: Clear sinuses.  Unremarkable orbits. Other: No sizable mastoid effusions. MRA HEAD FINDINGS Anterior circulation: Bilateral ICAs are patent through the carotid terminus. Bilateral M1 MCAs are patent. Left proximal M2 MCA inferior division is poorly visualized, but within an area of artifact. There is occlusion of two other proximal left M2 MCA branches in the left sylvian fissure (see series 24, images 41 and 45). Additionally, there is suspected occlusion of a more distal left  M2/M3 MCA branch (series 24, image 30). Smaller left A1 ACA, likely congenital. Otherwise, patent ACAs. Possible small (approximately 2 mm) outpouching in the region of the right ICA terminus. Posterior circulation: Patent intradural vertebral arteries and basilar artery. Right fetal type PCA. Patent proximal posterior cerebral arteries with suspected mild stenosis of the right P2 PCA. IMPRESSION: 1. Left posterior MCA territory acute infarcts with occlusion of multiple left M2 and M2/M3 MCA branches, as described above. Associated edema without substantial mass effect. 2. Moderate chronic microvascular ischemic disease. 3. Frontal predominant cerebral volume loss. 4. Possible small (approximately 2 mm) aneurysm in the region of the right ICA terminus. A CTA could further characterize if clinically indicated Critical/urgent findings discussed with Dr. Rory Percy at 6:14 p.m via telephone. Electronically Signed   By: Margaretha Sheffield MD   On: 05/30/2020 18:43    Assessment: 87 y.o. female presenting with aphasia and right sided sensory loss 1. Exam reveals a mixed receptive and expressive dysphasia, as well as right sided sensory loss.  2. MRI brain / MRA head reveals left posterior MCA territory acute infarcts  with occlusion of multiple left M2 and M2/M3 MCA branches. Moderate chronic microvascular ischemic disease and frontal predominant cerebral volume loss. Noted is a possible small (approximately 2 mm) aneurysm in the region of the right ICA terminus.  3. Stroke Risk Factors - Atrial fibrillation  Recommendations: 1. HgbA1c, fasting lipid panel 2. CTA of head and neck 3. PT consult, OT consult, Speech consult 4. Echocardiogram 5. BP management 6. Prophylactic therapy- Would start her on an oral anticoagulant for secondary stroke prevention in the context of her atrial fibrillation, unless she is a falls risk or has another contraindicaton. Continue ASA for now.  7. Risk factor modification 8.  Telemetry monitoring 9. Frequent neuro checks  @Electronically  signed: Dr. Kerney Elbe  05/31/2020, 7:05 AM

## 2020-05-31 NOTE — Progress Notes (Signed)
Rehab Admissions Coordinator Note:  Patient was screened by Cleatrice Burke for appropriateness for an Inpatient Acute Rehab Consult per therapy recs.   At this time, we are recommending Inpatient Rehab consult. I will place order per protocol.  Cleatrice Burke RN MSN 05/31/2020, 5:01 PM  I can be reached at [phone].

## 2020-05-31 NOTE — Telephone Encounter (Signed)
Nurse Practioner  is calling to discuss this patient anti coag plan. Would like to reconsider the [facility].

## 2020-05-31 NOTE — Telephone Encounter (Signed)
Hospital calling to check on status Would like response ASAP

## 2020-05-31 NOTE — Evaluation (Signed)
Occupational Therapy Evaluation Patient Details Name: Katrina Randolph MRN: [id number] DOB: [birth year] Today's Date: 05/31/2020    History of Present Illness This 87 y.o. female admitted with AMS, decreased balance, and speech difficulties.  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  PMH includes: A-fib, HTN, and anemia   Clinical Impression   Pt admitted with above. She demonstrates the below listed deficits and will benefit from continued OT to maximize safety and independence with BADLs.  Pt presents to OT with generalized weakness, mild Rt hemiparesis, impaired balance, impaired communication.  She currently requires set up - mod A for ADLs, and up to mod A +2 for functional mobility.  She lives alone and was fully independent prior admission including driving.  Her daughter lives close by and is available to assist PRN.  Will follow acutely.       Follow Up Recommendations  CIR;Supervision/Assistance - 24 hour    Equipment Recommendations  None recommended by OT    Recommendations for Other Services Rehab consult     Precautions / Restrictions Precautions Precautions: Fall Restrictions Weight Bearing Restrictions: No      Mobility Bed Mobility Overal bed mobility: Needs Assistance Bed Mobility: Supine to Sit     Supine to sit: Supervision     General bed mobility comments: Supervision for safety. With dynamic sitting balance, required min A    Transfers Overall transfer level: Needs assistance Equipment used: 2 person hand held assist Transfers: Sit to/from Stand Sit to Stand: Min assist;+2 physical assistance;+2 safety/equipment         General transfer comment: Min A for steadying to stand.    Balance Overall balance assessment: Needs assistance Sitting-balance support: No upper extremity supported;Feet supported Sitting balance-Leahy Scale: Fair Sitting balance -  Comments: Required min A for dynamic balance.   Standing balance support: No upper extremity supported;During functional activity Standing balance-Leahy Scale: Poor Standing balance comment: LOB X2; requiring external support                           ADL either performed or assessed with clinical judgement   ADL Overall ADL's : Needs assistance/impaired Eating/Feeding: Set up;Sitting   Grooming: Wash/dry hands;Wash/dry face;Oral care;Brushing hair;Set up;Sitting   Upper Body Bathing: Set up;Supervision/ safety;Sitting   Lower Body Bathing: Moderate assistance;Sit to/from stand   Upper Body Dressing : Minimal assistance;Sitting   Lower Body Dressing: Moderate assistance;Sit to/from stand   Toilet Transfer: Moderate assistance;+2 for safety/equipment;Ambulation;Comfort height toilet;Grab bars;RW   Toileting- Clothing Manipulation and Hygiene: Moderate assistance;Sit to/from stand       Functional mobility during ADLs: Moderate assistance;+2 for safety/equipment General ADL Comments: Pt with balance deficits requiring up to mod A to correct     Vision Baseline Vision/History: No visual deficits Additional Comments: Will benefit from further testing     Perception Perception Perception Tested?: Yes   Praxis      Pertinent Vitals/Pain Pain Assessment: Faces Faces Pain Scale: Hurts a little bit Pain Location: Rt side Pain Descriptors / Indicators: Grimacing;Guarding Pain Intervention(s): Limited activity within patient's tolerance;Monitored during session;Repositioned     Hand Dominance Right   Extremity/Trunk Assessment Upper Extremity Assessment Upper Extremity Assessment: Defer to OT evaluation RUE Deficits / Details: mild weakness and mild sensory deficits Rt UE RUE Sensation: decreased proprioception RUE Coordination: decreased fine motor   Lower Extremity Assessment Lower Extremity Assessment: RLE deficits/detail RLE Deficits /  Details: Mild  weakness noted, however, could not tell where PT was touching when performing sensory tests. Decreased proprioception and had difficulty telling where RLE was in space during mobility RLE Sensation: decreased proprioception;decreased light touch RLE Coordination: decreased gross motor;decreased fine motor   Cervical / Trunk Assessment Cervical / Trunk Assessment: Normal   Communication Communication Communication: Expressive difficulties   Cognition Arousal/Alertness: Awake/alert Behavior During Therapy: WFL for tasks assessed/performed Overall Cognitive Status: Difficult to assess                                 General Comments: Pt follows commands well   General Comments  Pt's daughter present during session    Exercises     Shoulder Instructions      Home Living Family/patient expects to be discharged to:: Private residence Living Arrangements: Alone Available Help at Discharge: Family;Available PRN/intermittently Type of Home: House Home Access: Stairs to enter Entrance Stairs-Number of Steps: 1 Entrance Stairs-Rails: None Home Layout: One level     Bathroom Shower/Tub: Occupational psychologist: Handicapped height     Home Equipment: Environmental consultant - 4 wheels;Cane - single point;Shower seat - built in   Additional Comments: daughter lives 2 minutes away from patient.       Prior Functioning/Environment Level of Independence: Independent with assistive device(s)        Comments: pt stands in shower, has grab bars and a bench but doesn't use it. She is independent with homemaking/ADL's and does not alway use rollator to mobilize. Drives, grocery shops        OT Problem List: Decreased strength;Decreased activity tolerance;Impaired balance (sitting and/or standing);Decreased coordination;Decreased cognition;Decreased safety awareness;Decreased knowledge of use of DME or AE;Impaired sensation;Impaired UE functional use      OT  Treatment/Interventions: Self-care/ADL training;Neuromuscular education;DME and/or AE instruction;Manual therapy;Therapeutic activities;Cognitive remediation/compensation;Visual/perceptual remediation/compensation;Patient/family education;Balance training    OT Goals(Current goals can be found in the care plan section) Acute Rehab OT Goals Patient Stated Goal: to get stronger prior to return home OT Goal Formulation: With patient Time For Goal Achievement: 06/14/20 Potential to Achieve Goals: Good ADL Goals Pt Will Perform Grooming: with min guard assist;standing Pt Will Perform Upper Body Bathing: with set-up;with supervision;sitting Pt Will Perform Lower Body Bathing: with min guard assist;sit to/from stand Pt Will Perform Upper Body Dressing: with set-up;sitting Pt Will Perform Lower Body Dressing: with min guard assist;sit to/from stand Pt Will Transfer to Toilet: with min guard assist;ambulating;regular height toilet;bedside commode;grab bars Pt Will Perform Toileting - Clothing Manipulation and hygiene: with min guard assist;sit to/from stand  OT Frequency: Min 2X/week   Barriers to D/C: Decreased caregiver support          Co-evaluation PT/OT/SLP Co-Evaluation/Treatment: Yes Reason for Co-Treatment: To address functional/ADL transfers;For patient/therapist safety PT goals addressed during session: Mobility/safety with mobility;Balance OT goals addressed during session: ADL's and self-care      AM-PAC OT "6 Clicks" Daily Activity     Outcome Measure Help from another person eating meals?: A Little Help from another person taking care of personal grooming?: A Little Help from another person toileting, which includes using toliet, bedpan, or urinal?: A Lot Help from another person bathing (including washing, rinsing, drying)?: A Lot Help from another person to put on and taking off regular upper body clothing?: A Little Help from another person to put on and taking off regular  lower body clothing?: A Lot 6 Click Score:  15   End of Session Equipment Utilized During Treatment: Gait belt Nurse Communication: Mobility status  Activity Tolerance: Patient tolerated treatment well Patient left: in chair;with call bell/phone within reach;with chair alarm set;with family/visitor present  OT Visit Diagnosis: Unsteadiness on feet (R26.81);Cognitive communication deficit (R41.841) Symptoms and signs involving cognitive functions: Cerebral infarction                Time: 1324-1350 OT Time Calculation (min): 26 min Charges:  OT General Charges $OT Visit: 1 Visit OT Evaluation $OT Eval Moderate Complexity: 1 Mod  Nilsa Nutting., OTR/L Acute Rehabilitation Services Pager [phone] Office [place], [place] 05/31/2020, 4:46 PM

## 2020-05-31 NOTE — Progress Notes (Signed)
Carotid duplex has been completed.   Preliminary results in CV Proc.   Abram Sander 05/31/2020 10:24 AM

## 2020-06-01 ENCOUNTER — Inpatient Hospital Stay (HOSPITAL_COMMUNITY): Payer: Medicare Other

## 2020-06-01 DIAGNOSIS — I63412 Cerebral infarction due to embolism of left middle cerebral artery: Secondary | ICD-10-CM

## 2020-06-01 LAB — BASIC METABOLIC PANEL
Anion gap: 8 (ref 5–15)
BUN: 11 mg/dL (ref 8–23)
CO2: 23 mmol/L (ref 22–32)
Calcium: 8.7 mg/dL — ABNORMAL LOW (ref 8.9–10.3)
Chloride: 109 mmol/L (ref 98–111)
Creatinine, Ser: 1.02 mg/dL — ABNORMAL HIGH (ref 0.44–1.00)
GFR, Estimated: 53 mL/min — ABNORMAL LOW (ref >60)
Glucose, Bld: 115 mg/dL — ABNORMAL HIGH (ref 70–99)
Potassium: 3.8 mmol/L (ref 3.5–5.1)
Sodium: 140 mmol/L (ref 135–145)

## 2020-06-01 LAB — CBC
HCT: 36.7 % (ref 36.0–46.0)
Hemoglobin: 11.5 g/dL — ABNORMAL LOW (ref 12.0–15.0)
MCH: 27.4 pg (ref 26.0–34.0)
MCHC: 31.3 g/dL (ref 30.0–36.0)
MCV: 87.6 fL (ref 80.0–100.0)
Platelets: 175 10*3/uL (ref 150–400)
RBC: 4.19 MIL/uL (ref 3.87–5.11)
RDW: 15.9 % — ABNORMAL HIGH (ref 11.5–15.5)
WBC: 6.9 10*3/uL (ref 4.0–10.5)
nRBC: 0 % (ref 0.0–0.2)

## 2020-06-01 MED ORDER — APIXABAN 5 MG PO TABS
5.0000 mg | ORAL_TABLET | Freq: Two times a day (BID) | ORAL | Status: DC
  Administered 2020-06-01 – 2020-06-05 (×8): 5 mg via ORAL
  Filled 2020-06-01 (×8): qty 1

## 2020-06-01 MED ORDER — APIXABAN 5 MG PO TABS: 5.0000 mg | ORAL_TABLET | Freq: Two times a day (BID) | ORAL | Status: DC

## 2020-06-01 NOTE — Progress Notes (Signed)
Modified Barium Swallow Progress Note  Patient Details  Name: Katrina Randolph MRN: [id number] Date of Birth: Nov 27, 1930  Today's Date: 06/01/2020  Modified Barium Swallow completed.  Full report located under Chart Review in the Imaging Section.  Brief recommendations include the following:  Clinical Impression  Pt presents with normal oropharyngeal swallow. With all solid and liquid POs, pt demonstrated adequate oral manipulation, cohesion and timely swallow trigger with strong pharyngeal peristalsis and laryngeal closure which was evident with absence of penetration/aspiration and normal amounts of residuals in vallecula and pyriform sinuses. It should be noted, however, that pt exhibited initial difficulty obtaining anterior- posterior oral transit of whole pill with thin liquids and eventually lodged in vallecular space briefly before fully propelled into cervical esophagus with help of puree wash. Cough x1 noted after swallowing whole pill, but no evidence of airway compromise with fluoro turned on. Recommend pt continue on regular, thin liquid diet with meds whole (puree wash as needed). SLP to f/u for tolerance and education.   Swallow Evaluation Recommendations       SLP Diet Recommendations: Regular solids;Thin liquid   Liquid Administration via: Straw;Cup   Medication Administration: Whole meds with liquid (puree wash as needed)   Supervision: Patient able to self feed   Compensations: Slow rate;Small sips/bites   Postural Changes: Seated upright at 90 degrees   Oral Care Recommendations: Oral care BID       Ellwood Dense, [place], [place] Office Number: [place] 06/01/2020,11:38 AM

## 2020-06-01 NOTE — Consult Note (Signed)
Physical Medicine and Rehabilitation Consult Reason for Consult: Altered mental status with decreased balance and speech difficulties Referring Physician: Dr. Avon Gully   HPI: Katrina Randolph is a 87 y.o. right-handed female with history of atrial fibrillation not on anticoagulation as patient did see [name] in the past for possible Watchman consideration of which patient declined, hypothyroidism, tobacco use.  Per chart review patient lives alone.  1 level home one-step to entry.  Independent with assistive device.  She is independent with homemaking/ADLs and still drives.  Presented 05/30/2020 with aphasia and decrease in balance as well as decreased sensation on her right side of acute onset.  CT/MRI as well as MRA head and neck showed left posterior MCA territory infarct with occlusion of multiple left M2 and M2/M3 MCA branches.  Moderate chronic microvascular ischemic disease.  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  Patient did not receive tPA.  Admission chemistries unremarkable except potassium 3.4 glucose 118.  Echocardiogram with ejection fraction of 55 to 60% no wall motion abnormalities.  Carotid Dopplers with no ICA stenosis.  Currently on aspirin for CVA prophylaxis.  Subcutaneous Lovenox for DVT prophylaxis.  Tolerating a regular diet.  Due to patient decreased functional ability and aphasia physical medicine rehab consult requested. Daughter at bedside.   Review of Systems  Constitutional: Negative for chills and fever.  HENT: Negative for hearing loss.   Eyes: Negative for blurred vision and double vision.  Respiratory: Negative for cough and shortness of breath.   Cardiovascular: Positive for palpitations. Negative for chest pain and leg swelling.  Gastrointestinal: Positive for constipation. Negative for heartburn, nausea and vomiting.  Genitourinary: Negative for dysuria and flank pain.  Musculoskeletal: Positive for joint pain and  myalgias.  Skin: Negative for rash.  Neurological: Positive for speech change and weakness.  All other systems reviewed and are negative.  Past Medical History:  Diagnosis Date  . Atrial fibrillation [facility])    Past Surgical History:  Procedure Laterality Date  . ABDOMINAL HYSTERECTOMY    . BIOPSY  10/19/2019   Procedure: BIOPSY;  Surgeon: [name], MD;  Location: [facility];  Service: Gastroenterology;;  . BREAST LUMPECTOMY    . CHOLECYSTECTOMY    . COLONOSCOPY WITH PROPOFOL N/A 10/19/2019   Procedure: COLONOSCOPY WITH PROPOFOL;  Surgeon: [name], MD;  Location: [facility];  Service: Gastroenterology;  Laterality: N/A;  . ESOPHAGOGASTRODUODENOSCOPY (EGD) WITH PROPOFOL N/A 10/19/2019   Procedure: ESOPHAGOGASTRODUODENOSCOPY (EGD) WITH PROPOFOL;  Surgeon: [name], MD;  Location: [facility];  Service: Gastroenterology;  Laterality: N/A;  . POLYPECTOMY  10/19/2019   Procedure: POLYPECTOMY;  Surgeon: [name], MD;  Location: [facility];  Service: Gastroenterology;;   No family history on file. Social History:  reports that she has quit smoking. Her smoking use included cigarettes. She has never used smokeless tobacco. She reports current alcohol use. She reports that she does not use drugs. Allergies:  Allergies  Allergen Reactions  . Sulfa Antibiotics     "feels bad"   Medications Prior to Admission  Medication Sig Dispense Refill  . esomeprazole (NEXIUM) 20 MG capsule Take 20 mg by mouth daily.    . furosemide (LASIX) 20 MG tablet Take 20 mg by mouth daily as needed for fluid.     Marland Kitchen levothyroxine (SYNTHROID) 112 MCG tablet Take 1 tablet (112 mcg total) by mouth every morning. 30 tablet 0  . metoprolol succinate (TOPROL-XL) 50 MG 24 hr tablet Take  1 tablet (50 mg total) by mouth daily. Take with or immediately following a meal. 30 tablet 1  . sertraline (ZOLOFT) 50 MG tablet Take 50 mg by mouth daily.    . traZODone (DESYREL) 50 MG tablet Take 50  mg by mouth at bedtime as needed for sleep.       Home: Home Living Family/patient expects to be discharged to:: Private residence Living Arrangements: Alone Available Help at Discharge: Family,Available PRN/intermittently Type of Home: House Home Access: Stairs to enter [employer] of Steps: 1 Entrance Stairs-Rails: None Home Layout: One level Bathroom Shower/Tub: Multimedia programmer: Handicapped height [place]: Environmental consultant - 4 wheels,Cane - single point,Shower seat - built in Additional Comments: daughter lives 2 minutes away from patient.   Functional History: Prior Function Level of Independence: Independent with assistive device(s) Comments: pt stands in shower, has grab bars and a bench but doesn't use it. She is independent with homemaking/ADL's and does not alway use rollator to mobilize. Drives, grocery shops Functional Status:  Mobility: Bed Mobility Overal bed mobility: Needs Assistance Bed Mobility: Supine to Sit Supine to sit: Supervision General bed mobility comments: Supervision for safety. With dynamic sitting balance, required min A Transfers Overall transfer level: Needs assistance Equipment used: 2 person hand held assist Transfers: Sit to/from Stand Sit to Stand: Min assist,+2 physical assistance,+2 safety/equipment General transfer comment: Min A for steadying to stand. Ambulation/Gait Ambulation/Gait assistance: Min assist,Mod assist,+2 physical assistance,Max assist Gait Distance (Feet): 20 Feet Assistive device: 1 person hand held assist,None Gait Pattern/deviations: Step-through pattern,Decreased stride length,Staggering right General Gait Details: Pt tended to drag RLE at times and with decreased awareness of where RLE was in space. 2 major LOB to the R noted, one specifically when turning. Requiring mod to max A +2 for stability during LOB. Otherwise requiring min to mod A for steadying. Gait velocity: Decreased     ADL: ADL Overall ADL's : Needs assistance/impaired Eating/Feeding: Set up,Sitting Grooming: Wash/dry hands,Wash/dry face,Oral care,Brushing hair,Set up,Sitting Upper Body Bathing: Set up,Supervision/ safety,Sitting Lower Body Bathing: Moderate assistance,Sit to/from stand Upper Body Dressing : Minimal assistance,Sitting Lower Body Dressing: Moderate assistance,Sit to/from stand Toilet Transfer: Moderate assistance,+2 for safety/equipment,Ambulation,Comfort height toilet,Grab bars,RW Toileting- Clothing Manipulation and Hygiene: Moderate assistance,Sit to/from stand Functional mobility during ADLs: Moderate assistance,+2 for safety/equipment General ADL Comments: Pt with balance deficits requiring up to mod A to correct  Cognition: Cognition Overall Cognitive Status: Difficult to assess Arousal/Alertness: Awake/alert Orientation Level: Oriented X4 Attention: Sustained,Selective Sustained Attention: Appears intact Selective Attention: Appears intact Memory: Appears intact Safety/Judgment: Appears intact Cognition Arousal/Alertness: Awake/alert Behavior During Therapy: WFL for tasks assessed/performed Overall Cognitive Status: Difficult to assess General Comments: Pt follows commands well Difficult to assess due to: Impaired communication  Blood pressure (!) 143/88, pulse 86, temperature 98.6 F (37 C), temperature source Oral, resp. rate 16, height 5\' 2"  (1.575 m), weight 76.8 kg, SpO2 94 %. Gen: no distress, normal appearing HEENT: oral mucosa pink and moist, NCAT Cardio: Irregularly irregular Chest: normal effort, normal rate of breathing Abd: soft, non-distended Ext: no edema Psych: pleasant, normal affect Skin: intact Neurological:     Comments: Patient is alert no acute distress.  Makes eye contact with examiner.  Appropriate for simple 1 and 2 word sentences for person and age.  She did have some difficulty with phrases.  Follows simple commands. Expressive aphasia  with word finding difficulties. 5/5 strength throughout.    Results for orders placed or performed during the hospital encounter of 05/30/20 (from  the past 24 hour(s))  CBC     Status: Abnormal   Collection Time: 06/01/20  1:12 AM  Result Value Ref Range   WBC 6.9 4.0 - 10.5 K/uL   RBC 4.19 3.87 - 5.11 MIL/uL   Hemoglobin 11.5 (L) 12.0 - 15.0 g/dL   HCT 36.7 36.0 - 46.0 %   MCV 87.6 80.0 - 100.0 fL   MCH 27.4 26.0 - 34.0 pg   MCHC 31.3 30.0 - 36.0 g/dL   RDW 15.9 (H) 11.5 - 15.5 %   Platelets 175 150 - 400 K/uL   nRBC 0.0 0.0 - 0.2 %  Basic metabolic panel     Status: Abnormal   Collection Time: 06/01/20  1:12 AM  Result Value Ref Range   Sodium 140 135 - 145 mmol/L   Potassium 3.8 3.5 - 5.1 mmol/L   Chloride 109 98 - 111 mmol/L   CO2 23 22 - 32 mmol/L   Glucose, Bld 115 (H) 70 - 99 mg/dL   BUN 11 8 - 23 mg/dL   Creatinine, Ser 1.02 (H) 0.44 - 1.00 mg/dL   Calcium 8.7 (L) 8.9 - 10.3 mg/dL   GFR, Estimated 53 (L) >60 mL/min   Anion gap 8 5 - 15   DG Chest 2 View  Result Date: 05/31/2020 CLINICAL DATA:  TIA EXAM: CHEST - 2 VIEW COMPARISON:  Radiograph 10/12/2018 FINDINGS: Diffuse pulmonary vascular congestion with hazy and streaky opacities in the mid to lower lungs and more patchy, coalescent opacity in the retrocardiac space. No pneumothorax or visible effusion. Biapical pleuroparenchymal scarring. Stable cardiomegaly with a calcified, tortuous aorta. The osseous structures appear diffusely demineralized which may limit detection of small or nondisplaced fractures. No acute osseous or soft tissue abnormality. Degenerative changes are present in the imaged spine and shoulders. Telemetry leads overlie the chest. IMPRESSION: Diffuse pulmonary vascular congestion with hazy and streaky opacities in the mid to lower lungs and more patchy, coalescent opacity in the retrocardiac space. Findings may reflect some developing edema and/or atelectasis. Infection less favored though not fully  excluded in the appropriate clinical context. Electronically Signed   By: Lovena Le M.D.   On: 05/31/2020 00:11   CT Head Wo Contrast  Result Date: 05/30/2020 CLINICAL DATA:  Altered mental status, last known normal 12 hours ago EXAM: CT HEAD WITHOUT CONTRAST TECHNIQUE: Contiguous axial images were obtained from the base of the skull through the vertex without intravenous contrast. COMPARISON:  None. FINDINGS: Brain: No evidence of acute large vascular territory infarction, hemorrhage, hydrocephalus, extra-axial collection or mass lesion/mass effect. Age related global parenchymal volume loss with ex vacuo dilatation of ventricular system and prominent extra-axial spaces. There are patchy subcortical and periventricular white matter hypodensities which are nonspecific but favored represent sequela of chronic ischemic small vessel white matter disease. Vascular: No hyperdense vessel. Atherosclerotic calcifications of the intracranial portions of the internal carotid and vertebral arteries. Skull: Hyperostosis frontalis. Negative for fracture or focal lesion. Sinuses/Orbits: The paranasal sinuses and mastoid air cells are predominantly clear. Pneumatized petrous apices. Orbits are grossly unremarkable. Other: None IMPRESSION: 1. No acute intracranial findings. 2. Age related global parenchymal volume loss and chronic ischemic small vessel white matter disease. Electronically Signed   By: Dahlia Bailiff MD   On: 05/30/2020 14:34   MR ANGIO HEAD WO CONTRAST  Result Date: 05/30/2020 CLINICAL DATA:  Neuro deficit, acute stroke suspected. EXAM: MRI HEAD WITHOUT CONTRAST MRA HEAD WITHOUT CONTRAST TECHNIQUE: Multiplanar, multiecho pulse sequences of the brain and  surrounding structures were obtained without intravenous contrast. Angiographic images of the head were obtained using MRA technique without contrast. COMPARISON:  None. FINDINGS: MRI HEAD FINDINGS Brain: Multiple acute infarcts in the left MCA territory,  including acute infarct of the posterior left insula, left periventricular frontal lobe and left frontoparietal cortex superiorly. There is T2/FLAIR hyperintense mild edema associated with these infarcts. No substantial mass effect. No midline shift. Basal cisterns are patent. No evidence of acute hemorrhage. No extra-axial fluid collections. Additional moderate T2/FLAIR hyperintensities within the white matter and pons, nonspecific but most likely related to chronic microvascular ischemic disease. Frontal predominant cerebral volume loss with associated prominence of the overlying extra-axial spaces. Vascular: Major arterial flow voids are maintained at the skull base. Skull and upper cervical spine: Normal marrow signal. Sinuses/Orbits: Clear sinuses.  Unremarkable orbits. Other: No sizable mastoid effusions. MRA HEAD FINDINGS Anterior circulation: Bilateral ICAs are patent through the carotid terminus. Bilateral M1 MCAs are patent. Left proximal M2 MCA inferior division is poorly visualized, but within an area of artifact. There is occlusion of two other proximal left M2 MCA branches in the left sylvian fissure (see series 24, images 41 and 45). Additionally, there is suspected occlusion of a more distal left M2/M3 MCA branch (series 24, image 30). Smaller left A1 ACA, likely congenital. Otherwise, patent ACAs. Possible small (approximately 2 mm) outpouching in the region of the right ICA terminus. Posterior circulation: Patent intradural vertebral arteries and basilar artery. Right fetal type PCA. Patent proximal posterior cerebral arteries with suspected mild stenosis of the right P2 PCA. IMPRESSION: 1. Left posterior MCA territory acute infarcts with occlusion of multiple left M2 and M2/M3 MCA branches, as described above. Associated edema without substantial mass effect. 2. Moderate chronic microvascular ischemic disease. 3. Frontal predominant cerebral volume loss. 4. Possible small (approximately 2 mm)  aneurysm in the region of the right ICA terminus. A CTA could further characterize if clinically indicated Critical/urgent findings discussed with Dr. Rory Percy at 6:14 p.m via telephone. Electronically Signed   By: Margaretha Sheffield MD   On: 05/30/2020 18:43   MR BRAIN WO CONTRAST  Result Date: 05/30/2020 CLINICAL DATA:  Neuro deficit, acute stroke suspected. EXAM: MRI HEAD WITHOUT CONTRAST MRA HEAD WITHOUT CONTRAST TECHNIQUE: Multiplanar, multiecho pulse sequences of the brain and surrounding structures were obtained without intravenous contrast. Angiographic images of the head were obtained using MRA technique without contrast. COMPARISON:  None. FINDINGS: MRI HEAD FINDINGS Brain: Multiple acute infarcts in the left MCA territory, including acute infarct of the posterior left insula, left periventricular frontal lobe and left frontoparietal cortex superiorly. There is T2/FLAIR hyperintense mild edema associated with these infarcts. No substantial mass effect. No midline shift. Basal cisterns are patent. No evidence of acute hemorrhage. No extra-axial fluid collections. Additional moderate T2/FLAIR hyperintensities within the white matter and pons, nonspecific but most likely related to chronic microvascular ischemic disease. Frontal predominant cerebral volume loss with associated prominence of the overlying extra-axial spaces. Vascular: Major arterial flow voids are maintained at the skull base. Skull and upper cervical spine: Normal marrow signal. Sinuses/Orbits: Clear sinuses.  Unremarkable orbits. Other: No sizable mastoid effusions. MRA HEAD FINDINGS Anterior circulation: Bilateral ICAs are patent through the carotid terminus. Bilateral M1 MCAs are patent. Left proximal M2 MCA inferior division is poorly visualized, but within an area of artifact. There is occlusion of two other proximal left M2 MCA branches in the left sylvian fissure (see series 24, images 41 and 45). Additionally, there is suspected  occlusion  of a more distal left M2/M3 MCA branch (series 24, image 30). Smaller left A1 ACA, likely congenital. Otherwise, patent ACAs. Possible small (approximately 2 mm) outpouching in the region of the right ICA terminus. Posterior circulation: Patent intradural vertebral arteries and basilar artery. Right fetal type PCA. Patent proximal posterior cerebral arteries with suspected mild stenosis of the right P2 PCA. IMPRESSION: 1. Left posterior MCA territory acute infarcts with occlusion of multiple left M2 and M2/M3 MCA branches, as described above. Associated edema without substantial mass effect. 2. Moderate chronic microvascular ischemic disease. 3. Frontal predominant cerebral volume loss. 4. Possible small (approximately 2 mm) aneurysm in the region of the right ICA terminus. A CTA could further characterize if clinically indicated Critical/urgent findings discussed with Dr. Rory Percy at 6:14 p.m via telephone. Electronically Signed   By: Margaretha Sheffield MD   On: 05/30/2020 18:43   ECHOCARDIOGRAM COMPLETE  Result Date: 05/31/2020    ECHOCARDIOGRAM REPORT   Patient Name:   Katrina Randolph Date of Exam: 05/31/2020 Medical Rec #:  [id number]             Height:       62.0 in Accession #:    [id number]            Weight:       169.3 lb Date of Birth:  Jan 20, 1931             BSA:          1.781 m Patient Age:    89 years              BP:           129/77 mmHg Patient Gender: F                     HR:           103 bpm. Exam Location:  Inpatient Procedure: 2D Echo, Cardiac Doppler and Color Doppler Indications:    Stroke I63.9  History:        Patient has prior history of Echocardiogram examinations, most                 recent 10/13/2019. Arrythmias:Atrial Fibrillation.  Sonographer:    Tiffany Dance Referring Phys: [id number] [place]  1. Left ventricular ejection fraction, by estimation, is 55 to 60%. The left ventricle has normal function. The left ventricle has no regional wall motion  abnormalities. Left ventricular diastolic function could not be evaluated.  2. Right ventricular systolic function is moderately reduced. The right ventricular size is mildly enlarged. There is severely elevated pulmonary artery systolic pressure. The estimated right ventricular systolic pressure is 20.9 mmHg.  3. Left atrial size was severely dilated.  4. Right atrial size was severely dilated.  5. The pericardial effusion is posterior to the left ventricle.  6. The mitral valve is normal in structure. Mild mitral valve regurgitation. No evidence of mitral stenosis.  7. Tricuspid valve regurgitation is mild to moderate.  8. The aortic valve is normal in structure. Aortic valve regurgitation is not visualized. No aortic stenosis is present.  9. The inferior vena cava is dilated in size with >50% respiratory variability, suggesting right atrial pressure of 8 mmHg. FINDINGS  Left Ventricle: Left ventricular ejection fraction, by estimation, is 55 to 60%. The left ventricle has normal function. The left ventricle has no regional wall motion abnormalities. The left ventricular internal cavity size was normal in size. There is  no  left ventricular hypertrophy. Left ventricular diastolic function could not be evaluated due to atrial fibrillation. Left ventricular diastolic function could not be evaluated. Right Ventricle: The right ventricular size is mildly enlarged. No increase in right ventricular wall thickness. Right ventricular systolic function is moderately reduced. There is severely elevated pulmonary artery systolic pressure. The tricuspid regurgitant velocity is 3.69 m/s, and with an assumed right atrial pressure of 8 mmHg, the estimated right ventricular systolic pressure is 38.1 mmHg. Left Atrium: Left atrial size was severely dilated. Right Atrium: Right atrial size was severely dilated. Pericardium: Trivial pericardial effusion is present. The pericardial effusion is posterior to the left ventricle. Mitral  Valve: The mitral valve is normal in structure. Mild mitral valve regurgitation. No evidence of mitral valve stenosis. Tricuspid Valve: The tricuspid valve is normal in structure. Tricuspid valve regurgitation is mild to moderate. No evidence of tricuspid stenosis. Aortic Valve: The aortic valve is normal in structure. Aortic valve regurgitation is not visualized. No aortic stenosis is present. Pulmonic Valve: The pulmonic valve was normal in structure. Pulmonic valve regurgitation is trivial. No evidence of pulmonic stenosis. Aorta: The aortic root is normal in size and structure. Venous: The inferior vena cava is dilated in size with greater than 50% respiratory variability, suggesting right atrial pressure of 8 mmHg. IAS/Shunts: The interatrial septum appears to be lipomatous. No atrial level shunt detected by color flow Doppler.  LEFT VENTRICLE PLAX 2D LVIDd:         4.20 cm LVIDs:         3.30 cm LV PW:         1.10 cm LV IVS:        1.10 cm LVOT diam:     1.80 cm LV SV:         33 LV SV Index:   18 LVOT Area:     2.54 cm  RIGHT VENTRICLE          IVC RV Basal diam:  3.60 cm  IVC diam: 2.60 cm RV Mid diam:    2.60 cm TAPSE (M-mode): 1.3 cm LEFT ATRIUM             Index       RIGHT ATRIUM           Index LA diam:        5.70 cm 3.20 cm/m  RA Area:     30.60 cm LA Vol (A2C):   93.1 ml 52.27 ml/m RA Volume:   110.00 ml 61.76 ml/m LA Vol (A4C):   81.0 ml 45.48 ml/m LA Biplane Vol: 87.7 ml 49.24 ml/m  AORTIC VALVE LVOT Vmax:   69.45 cm/s LVOT Vmean:  45.100 cm/s LVOT VTI:    0.129 m  AORTA Ao Root diam: 3.00 cm Ao Asc diam:  3.00 cm MITRAL VALVE                TRICUSPID VALVE MV Area (PHT): 3.27 cm     TR Peak grad:   54.5 mmHg MV Decel Time: 232 msec     TR Vmax:        369.00 cm/s MV E velocity: 112.00 cm/s MV A velocity: 41.20 cm/s   SHUNTS MV E/A ratio:  2.72         Systemic VTI:  0.13 m                             Systemic Diam: 1.80 cm  Fransico Him MD Electronically signed by Fransico Him MD Signature  Date/Time: 05/31/2020/3:43:30 PM    Final    VAS US CAROTID  Result Date: 05/31/2020 Carotid Arterial Duplex Study Indications: CVA. Performing Technologist: Abram Sander RVS  Examination Guidelines: A complete evaluation includes B-mode imaging, spectral Doppler, color Doppler, and power Doppler as needed of all accessible portions of each vessel. Bilateral testing is considered an integral part of a complete examination. Limited examinations for reoccurring indications may be performed as noted.  Right Carotid Findings: +----------+--------+--------+--------+------------------+--------+           PSV cm/sEDV cm/sStenosisPlaque DescriptionComments +----------+--------+--------+--------+------------------+--------+ CCA Prox  41      9               heterogenous               +----------+--------+--------+--------+------------------+--------+ CCA Distal41      10              heterogenous               +----------+--------+--------+--------+------------------+--------+ ICA Prox  73      18      1-39%   heterogenous               +----------+--------+--------+--------+------------------+--------+ ICA Distal93      24                                         +----------+--------+--------+--------+------------------+--------+ ECA       90                                                 +----------+--------+--------+--------+------------------+--------+ +----------+--------+-------+--------+-------------------+           PSV cm/sEDV cmsDescribeArm Pressure (mmHG) +----------+--------+-------+--------+-------------------+ [id number]                                         +----------+--------+-------+--------+-------------------+ +---------+--------+--+--------+--+---------+ VertebralPSV cm/s66EDV cm/s10Antegrade +---------+--------+--+--------+--+---------+  Left Carotid Findings: +----------+--------+--------+--------+------------------+--------+            PSV cm/sEDV cm/sStenosisPlaque DescriptionComments +----------+--------+--------+--------+------------------+--------+ CCA Prox  48      7               heterogenous               +----------+--------+--------+--------+------------------+--------+ CCA Distal33      5               heterogenous               +----------+--------+--------+--------+------------------+--------+ ICA Prox  102     22      1-39%   heterogenous               +----------+--------+--------+--------+------------------+--------+ ICA Distal62      19                                         +----------+--------+--------+--------+------------------+--------+ ECA       71      8                                          +----------+--------+--------+--------+------------------+--------+ +----------+--------+--------+--------+-------------------+  PSV cm/sEDV cm/sDescribeArm Pressure (mmHG) +----------+--------+--------+--------+-------------------+ [id number]                                          +----------+--------+--------+--------+-------------------+ +---------+--------+--+--------+--+---------+ VertebralPSV cm/s39EDV cm/s13Antegrade +---------+--------+--+--------+--+---------+   Summary: Right Carotid: Velocities in the right ICA are consistent with a 1-39% stenosis. Left Carotid: Velocities in the left ICA are consistent with a 1-39% stenosis. Vertebrals: Bilateral vertebral arteries demonstrate antegrade flow. *See table(s) above for measurements and observations.  Electronically signed by Jamelle Haring on 05/31/2020 at 4:30:40 PM.    Final      Assessment/Plan: Diagnosis: Left MCA CVA 1. Does the need for close, 24 hr/day medical supervision in concert with the patient's rehab needs make it unreasonable for this patient to be served in a less intensive setting? Yes 2. Co-Morbidities requiring supervision/potential complications:  1. Atrial fibrillation:  monitor HR TID 2. Recurrent symptomatic anemia: monitor Hgb 3. Hyperlipidemia 4. Iron deficiency anemia. 5. Orthostatic hypotension: monitor BP TID 3. Due to bladder management, bowel management, safety, skin/wound care, disease management, medication administration, pain management and patient education, does the patient require 24 hr/day rehab nursing? Yes 4. Does the patient require coordinated care of a physician, rehab nurse, therapy disciplines of PT, OT, SLP to address physical and functional deficits in the context of the above medical diagnosis(es)? Yes Addressing deficits in the following areas: balance, endurance, locomotion, strength, transferring, bowel/bladder control, bathing, dressing, feeding, grooming, toileting, speech and psychosocial support, language 5. Can the patient actively participate in an intensive therapy program of at least 3 hrs of therapy per day at least 5 days per week? Yes 6. The potential for patient to make measurable gains while on inpatient rehab is excellent 7. Anticipated functional outcomes upon discharge from inpatient rehab are supervision  with PT, supervision with OT, supervision with SLP. 8. Estimated rehab length of stay to reach the above functional goals is: 10-14 days 9. Anticipated discharge destination: Home 10. Overall Rehab/Functional Prognosis: excellent  RECOMMENDATIONS: This patient's condition is appropriate for continued rehabilitative care in the following setting: CIR Patient has agreed to participate in recommended program. Yes Note that insurance prior authorization may be required for reimbursement for recommended care.  Comment: Thank you for this consult. Admission coordinator to follow.   I have personally performed a face to face diagnostic evaluation, including, but not limited to relevant history and physical exam findings, of this patient and developed relevant assessment and plan.  Additionally, I have reviewed and concur  with the physician assistant's documentation above.  Leeroy Cha, MD  Lavon Paganini [place], PA-C 06/01/2020

## 2020-06-01 NOTE — PMR Pre-admission (Addendum)
PMR Admission Coordinator Pre-Admission Assessment  Patient: Katrina Randolph is an 87 y.o., female MRN: [id number] DOB: 1930-02-26 Height: '5\' 2"'  (157.5 cm) (pt stated) Weight: 76.8 kg              Insurance Information HMO:      PPO: Yes     PCP:       IPA:       80/20:       OTHER:  Group [id number] PRIMARY: [employer] Medicare      Policy#: [id number]      Subscriber: patient CM Name: n/a      Phone#: [phone]     Fax#: [phone] [place] with [employer] called 06/02/20  Gave approval for 7  days,  4/22-4/28  fax clinicals to [phone]  Pre-Cert#: [id number]      Employer: Retired Benefits:  Phone #: [phone]     Name: online [email or web address] Eff. Date: 02/12/20     Deduct: $0      Out of Pocket Max: $500 (met $70)      Life Max: N/A  CIR: $25 admission copay      SNF: 100% coverage for maximum 100 days Outpatient: 100%     Co-Pay: none Home Health: 100%      Co-Pay: none DME: 80%     Co-Pay: 20% Providers: in network  SECONDARY:       Policy#:       Phone#:   Development worker, community:       Phone#:   The Engineer, [employer]" for patients in Inpatient Rehabilitation Facilities with attached "Privacy Act [place] Records" was provided and verbally reviewed with: Patient and Family  Emergency Contact Information Contact Information    Name Relation Home Work [employer] Daughter [phone]  [phone]     Current Medical History  Patient Admitting Diagnosis: CVA  History of Present Illness: : Katrina Randolph is an 87 year old right-handed female with history of atrial fibrillation not on anticoagulation as patient did see [name] in the past for possible watchman's consideration of which patient declined, hypothyroidism, tobacco use.  Per chart review lives alone 1 level home one-step to entry.  Independent with assistive device.  She is independent with homemaking ADLs and still drives.  Presented 05/30/2020 with aphasia  and decrease in balance as well as decreased sensation on her right side of acute onset.  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  Moderate chronic microvascular ischemic disease.  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  Patient did not receive tPA.  Admission chemistries unremarkable except potassium 3.4 glucose 118.  Echocardiogram with ejection fraction of 55 to 60% no wall motion abnormalities.  Carotid Dopplers with no ICA stenosis.  Initially maintained on aspirin for CVA prophylaxis and changed to Eliquis in light of history of atrial fibrillation.    Tolerating a regular consistency diet.  Due to patient's decreased functional ability aphasia was admitted for a comprehensive rehab program.   Complete NIHSS TOTAL: 3 Glasgow Coma Scale Score: 15  Past Medical History  Past Medical History:  Diagnosis Date  . Atrial fibrillation ([place])     Family History  family history is not on file.  Prior Rehab/Hospitalizations:  Has the patient had prior rehab or hospitalizations prior to admission? Yes  Has the patient had major surgery during 100 days  prior to admission? No  Current Medications   Current Facility-Administered Medications:  .  acetaminophen (TYLENOL) tablet 650 mg, 650 mg, Oral, Q4H PRN, 650 mg at 06/01/20 0813 **OR** acetaminophen (TYLENOL) 160 MG/5ML solution 650 mg, 650 mg, Per Tube, Q4H PRN **OR** acetaminophen (TYLENOL) suppository 650 mg, 650 mg, Rectal, Q4H PRN, Hugelmeyer, Alexis, DO .  aspirin EC tablet 81 mg, 81 mg, Oral, Daily, Hugelmeyer, Alexis, DO, 81 mg at 06/01/20 0813 .  atorvastatin (LIPITOR) tablet 20 mg, 20 mg, Oral, Daily, Rosalin Hawking, MD, 20 mg at 06/01/20 0813 .  enoxaparin (LOVENOX) injection 40 mg, 40 mg, Subcutaneous, QHS, Hugelmeyer, Alexis, DO, 40 mg at 05/31/20 2100 .  levothyroxine (SYNTHROID) tablet 112 mcg, 112  mcg, Oral, q morning, Hugelmeyer, Alexis, DO, 112 mcg at 06/01/20 0503 .  pantoprazole (PROTONIX) EC tablet 40 mg, 40 mg, Oral, Daily, Hugelmeyer, Alexis, DO, 40 mg at 06/01/20 0813 .  senna-docusate (Senokot-S) tablet 1 tablet, 1 tablet, Oral, QHS PRN, Hugelmeyer, Alexis, DO .  sertraline (ZOLOFT) tablet 50 mg, 50 mg, Oral, Daily, Hugelmeyer, Alexis, DO, 50 mg at 06/01/20 0813  Patients Current Diet:  Diet Order            Diet Heart Room service appropriate? Yes; Fluid consistency: Thin  Diet effective now                 Precautions / Restrictions Precautions Precautions: Fall Restrictions Weight Bearing Restrictions: No   Has the patient had 2 or more falls or a fall with injury in the past year?Yes  Prior Activity Level Limited Community (1-2x/wk): Went out 2-3 times a week  Prior Functional Level Prior Function Level of Independence: Independent with assistive device(s) Comments: pt stands in shower, has grab bars and a bench but doesn't use it. She is independent with homemaking/ADL's and does not alway use rollator to mobilize. Drives, grocery shops  Self Care: Did the patient need help bathing, dressing, using the toilet or eating?  Independent  Indoor Mobility: Did the patient need assistance with walking from room to room (with or without device)? Independent  Stairs: Did the patient need assistance with internal or external stairs (with or without device)? Independent  Functional Cognition: Did the patient need help planning regular tasks such as shopping or remembering to take medications? [place] / [place] Devices/Equipment: Cane (specify quad or straight),Walker (specify type),Built-in shower seat (single point cane, 4 wheeled walker, walk-in shower) Home Equipment: Walker - 4 wheels,Cane - single point,Shower seat - built in  Prior Device Use: Indicate devices/aids used by the patient prior to current illness,  exacerbation or injury? Walker and [employer]  Current Functional Level Cognition  Arousal/Alertness: Awake/alert Overall Cognitive Status: Difficult to assess Difficult to assess due to: Impaired communication (expressive difficulties) Orientation Level: Oriented X4 General Comments: follows all commands appropriately, requires cues for safety intermittently and presents with some R inattention Attention: Sustained,Selective Sustained Attention: Appears intact Selective Attention: Appears intact Memory: Appears intact Safety/Judgment: Appears intact    Extremity Assessment (includes Sensation/Coordination)  Upper Extremity Assessment: Defer to OT evaluation RUE Deficits / Details: mild weakness and mild sensory deficits Rt UE RUE Sensation: decreased proprioception RUE Coordination: decreased fine motor  Lower Extremity Assessment: RLE deficits/detail RLE Deficits / Details: Mild weakness noted, however, could not tell where PT was touching when performing sensory tests. Decreased proprioception and had difficulty telling where RLE was in space during mobility RLE Sensation: decreased proprioception,decreased light touch RLE Coordination: decreased  gross motor,decreased fine motor    ADLs  Overall ADL's : Needs assistance/impaired Eating/Feeding: Set up,Sitting Grooming: Wash/dry hands,Wash/dry face,Oral care,Brushing hair,Set up,Sitting Upper Body Bathing: Set up,Supervision/ safety,Sitting Lower Body Bathing: Moderate assistance,Sit to/from stand Upper Body Dressing : Minimal assistance,Sitting Lower Body Dressing: Moderate assistance,Sit to/from stand Toilet Transfer: Moderate assistance,+2 for safety/equipment,Ambulation,Comfort height toilet,Grab bars,RW Toileting- Clothing Manipulation and Hygiene: Moderate assistance,Sit to/from stand Functional mobility during ADLs: Moderate assistance,+2 for safety/equipment General ADL Comments: Pt with balance deficits requiring up to mod A  to correct    Mobility  Overal bed mobility: Needs Assistance Bed Mobility: Supine to Sit Supine to sit: Supervision General bed mobility comments: Supervision for safety. With dynamic sitting balance, required min A    Transfers  Overall transfer level: Needs assistance Equipment used: 2 person hand held assist Transfers: Sit to/from Stand Sit to Stand: Min assist,+2 physical assistance,+2 safety/equipment General transfer comment: Min A for steadying to stand.    Ambulation / Gait / Stairs / Wheelchair Mobility  Ambulation/Gait Ambulation/Gait assistance: Min assist,Mod assist,+2 physical assistance,Max assist Gait Distance (Feet): 20 Feet Assistive device: 1 person hand held assist,None Gait Pattern/deviations: Step-through pattern,Decreased stride length,Staggering right General Gait Details: Pt tended to drag RLE at times and with decreased awareness of where RLE was in space. 2 major LOB to the R noted, one specifically when turning. Requiring mod to max A +2 for stability during LOB. Otherwise requiring min to mod A for steadying. Gait velocity: Decreased    Posture / Balance Dynamic Sitting Balance Sitting balance - Comments: Required min A for dynamic balance. Balance Overall balance assessment: Needs assistance Sitting-balance support: No upper extremity supported,Feet supported Sitting balance-Leahy Scale: Fair Sitting balance - Comments: Required min A for dynamic balance. Standing balance support: No upper extremity supported,During functional activity Standing balance-Leahy Scale: Poor Standing balance comment: LOB X2; requiring external support    Special needs/care consideration  None     Previous Home Environment (from acute therapy documentation) Living Arrangements: Alone Available Help at Discharge: Family,Available PRN/intermittently Type of Home: House Home Layout: One level Home Access: Stairs to enter Entrance Stairs-Rails: None Entrance  Stairs-Number of Steps: 1 Bathroom Shower/Tub: Multimedia programmer: Handicapped height Home Care Services: No Additional Comments: daughter lives 2 minutes away from patient.   Discharge Living Setting Plans for Discharge Living Setting: House,Alone ([place]) Type of Home at Discharge: Other (Comment) ([place]) Discharge Home Layout: One level Discharge Home Access: Stairs to enter Entrance Stairs-Rails: None Entrance Stairs-Number of Steps: 1 from garage Discharge Bathroom Shower/Tub: Walk-in shower,Door Discharge Bathroom Toilet: Handicapped height Discharge Bathroom Accessibility: Yes How Accessible: Accessible via wheelchair,Accessible via walker Does the patient have any problems obtaining your medications?: No  Social/Family/Support Systems Patient Roles: Parent (Has 2 daughters) Contact Information: Aleiyah Halpin Anticipated Caregiver: Daughters Ability/Limitations of Caregiver: One daughter lives 2-3 blocks away; another daughter here from [place] for extended time to help Caregiver Availability: 24/7 Discharge Plan Discussed with Primary Caregiver: Yes Is Caregiver In Agreement with Plan?: Yes Does Caregiver/Family have Issues with Lodging/Transportation while Pt is in Rehab?: No  Goals Patient/Family Goal for Rehab: PT/OT supervision to mod I goals Expected length of stay: 7-10 days Cultural Considerations: None Pt/Family Agrees to Admission and willing to participate: Yes Program Orientation Provided & Reviewed with Pt/Caregiver Including Roles  & Responsibilities: Yes  Decrease burden of Care through IP rehab admission: N/A  Possible need for SNF placement upon discharge: Not anticipated   Patient Condition: This patient's medical and  functional status has changed since the consult dated: 05/31/20 in which the Rehabilitation Physician determined and documented that the patient's condition is appropriate for intensive rehabilitative care in an  inpatient rehabilitation facility. See "History of Present Illness" (above) for medical update. Functional changes are: Pt.ambulating with min-mod A, transferring at min A level.. Patient's medical and functional status update has been discussed with the Rehabilitation physician and patient remains appropriate for inpatient rehabilitation. Will admit to inpatient rehab tomorrow, 06/03/2020.  Preadmission Screen Completed By:  Retta Diones, RN, 06/01/2020 3:00 PM ______________________________________________________________________   Discussed status with Dr. Naaman Plummer on 06/02/2020  at 1500  and received approval for admission tomorrow 06/03/20.  Admission Coordinator:  Retta Diones, with updated by Clemens Catholic  Time 1620 Sudie Grumbling 06/02/2020

## 2020-06-01 NOTE — Progress Notes (Addendum)
PROGRESS NOTE    Katrina Randolph  PPJ:[id number] DOB: 1930/08/19 DOA: 05/30/2020 PCP: [name], PA-C   Brief Narrative:  Katrina Randolph is a 87 y.o. female with a known history of atrial fibrillation not on anticoagulation, hypothyroidism presents to the emergency department for evaluation of altered mental status.  Patient was in a usual state of health until 18 hours prior to arrival she was last seen normal.  She states that she lost her balance and fell.  She subsequently reported right upper extremity and right lower extremity weakness associated with difficulty with word finding, mild confusion. Events were unwitnessed.  Patient called her daughter.  She lives alone, normally functionally independent, ambulates without assistance. At present patient reports difficulty with word finding, right sided weakness and decreased sensation on the right. Otherwise there has been no change in status. Patient has been taking medication as prescribed and there has been no recent change in medication or diet. No recent antibiotics. There has been no recent illness, hospitalizations, travel or sick contacts.  Assessment & Plan:   Active Problems:   CVA (cerebral vascular accident) ([place])  Acute left posterior MCA CVA - POA - Neurology following, appreciate insight and recommendations - Speech eval ongoing - MBS planned later today - Likely need to resume full dose anticoagulation - defer to neuro on timing - Possibly planning for watchman device implantation in outpatient setting - Carotid - <40% - Echo pending - Lipid panel unremarkable, A1c 6.2 - Continue aspirin 81, atorvastatin 20  History of atrial fibrillation - Metoprolol on hold - BP well controlled  Hypothyroidism - Continue levothyroxine  DVT prophylaxis: Lovenox Code Status: Full Family Communication: None present  Status is: Inpatient  Dispo: The patient is from: Home              Anticipated d/c is to: CIR               Anticipated d/c date is: 24 to 48 hours              Patient currently not medically stable for discharge  Consultants:   Neurology, Cardiology  Procedures:   None  Antimicrobials:  None  Subjective: No acute issues or events overnight, feels quite well, continues ongoing work-up with neurology.  States her deficits have essentially resolved and feels back to baseline.  Objective: Vitals:   05/31/20 1638 05/31/20 2054 06/01/20 0004 06/01/20 0420  BP: (!) 147/82 (!) 146/71 (!) 148/70 (!) 143/88  Pulse: (!) 101 95 100 86  Resp: 20 16 18 16   Temp: 98.5 F (36.9 C) 97.8 F (36.6 C) 98.6 F (37 C)   TempSrc:  Oral Oral   SpO2: 94% 98% 99% 94%  Weight:      Height:        Intake/Output Summary (Last 24 hours) at 06/01/2020 0724 Last data filed at 05/31/2020 1700 Gross per 24 hour  Intake --  Output 450 ml  Net -450 ml   Filed Weights   05/30/20 2227  Weight: 76.8 kg    Examination:  General: Pleasantly resting in bed, No acute distress. HEENT: Normocephalic atraumatic. Sclerae nonicteric, noninjected. Extraocular movements intact bilaterally. Neck: Without mass or deformity. Trachea is midline. Lungs: Clear to auscultate bilaterally without rhonchi, wheeze, or rales. Heart: Regular rate and rhythm. Without murmurs, rubs, or gallops. Abdomen: Soft, nontender, nondistended. Without guarding or rebound. Extremities: Without cyanosis, clubbing, edema, or obvious deformity. Vascular: Dorsalis pedis and posterior tibial pulses palpable bilaterally. Skin: Warm and  dry, no erythema, no ulcerations.  Data Reviewed: I have personally reviewed following labs and imaging studies  CBC: Recent Labs  Lab 05/30/20 1338 05/31/20 0022 06/01/20 0112  WBC 8.1 8.6 6.9  NEUTROABS 6.6  --   --   HGB 12.3 12.2 11.5*  HCT 40.7 39.5 36.7  MCV 90.0 88.4 87.6  PLT 196 137* 517   Basic Metabolic Panel: Recent Labs  Lab 05/30/20 1338 05/31/20 0022 06/01/20 0112  NA  139  --  140  K 3.4*  --  3.8  CL 106  --  109  CO2 23  --  23  GLUCOSE 118*  --  115*  BUN 16  --  11  CREATININE 0.91 0.82 1.02*  CALCIUM 8.8*  --  8.7*   GFR: Estimated Creatinine Clearance: 37.3 mL/min (A) (by C-G formula based on SCr of 1.02 mg/dL (H)). Liver Function Tests: Recent Labs  Lab 05/30/20 1338  AST 16  ALT 13  ALKPHOS 60  BILITOT 1.7*  PROT 7.3  ALBUMIN 4.0   No results for input(s): LIPASE, AMYLASE in the last 168 hours. No results for input(s): AMMONIA in the last 168 hours. Coagulation Profile: No results for input(s): INR, PROTIME in the last 168 hours. Cardiac Enzymes: No results for input(s): CKTOTAL, CKMB, CKMBINDEX, TROPONINI in the last 168 hours. BNP (last 3 results) No results for input(s): PROBNP in the last 8760 hours. HbA1C: Recent Labs    05/31/20 0022  HGBA1C 6.2*   CBG: No results for input(s): GLUCAP in the last 168 hours. Lipid Profile: Recent Labs    05/31/20 0022  CHOL 171  HDL 54  LDLCALC 103*  TRIG 69  CHOLHDL 3.2   Thyroid Function Tests: No results for input(s): TSH, T4TOTAL, FREET4, T3FREE, THYROIDAB in the last 72 hours. Anemia Panel: No results for input(s): VITAMINB12, FOLATE, FERRITIN, TIBC, IRON, RETICCTPCT in the last 72 hours. Sepsis Labs: No results for input(s): PROCALCITON, LATICACIDVEN in the last 168 hours.  Recent Results (from the past 240 hour(s))  Resp Panel by RT-PCR (Flu A&B, Covid) Nasopharyngeal Swab     Status: None   Collection Time: 05/30/20  3:29 PM   Specimen: Nasopharyngeal Swab; Nasopharyngeal(NP) swabs in vial transport medium  Result Value Ref Range Status   SARS Coronavirus 2 by RT PCR NEGATIVE NEGATIVE Final    Comment: (NOTE) SARS-CoV-2 target nucleic acids are NOT DETECTED.  The SARS-CoV-2 RNA is generally detectable in upper respiratory specimens during the acute phase of infection. The lowest concentration of SARS-CoV-2 viral copies this assay can detect is 138 copies/mL. A  negative result does not preclude SARS-Cov-2 infection and should not be used as the sole basis for treatment or other patient management decisions. A negative result may occur with  improper specimen collection/handling, submission of specimen other than nasopharyngeal swab, presence of viral mutation(s) within the areas targeted by this assay, and inadequate number of viral copies(<138 copies/mL). A negative result must be combined with clinical observations, patient history, and epidemiological information. The expected result is Negative.  Fact Sheet for Patients:  EntrepreneurPulse.com.au  Fact Sheet for Healthcare Providers:  IncredibleEmployment.be  This test is no t yet approved or cleared by the [place] FDA and  has been authorized for detection and/or diagnosis of SARS-CoV-2 by FDA under an Emergency Use Authorization (EUA). This EUA will remain  in effect (meaning this test can be used) for the duration of the COVID-19 declaration under Section 564(b)(1) of the Act, 21 U.S.C.section  360bbb-3(b)(1), unless the authorization is terminated  or revoked sooner.       Influenza A by PCR NEGATIVE NEGATIVE Final   Influenza B by PCR NEGATIVE NEGATIVE Final    Comment: (NOTE) The Xpert Xpress SARS-CoV-2/FLU/RSV plus assay is intended as an aid in the diagnosis of influenza from Nasopharyngeal swab specimens and should not be used as a sole basis for treatment. Nasal washings and aspirates are unacceptable for Xpert Xpress SARS-CoV-2/FLU/RSV testing.  Fact Sheet for Patients: EntrepreneurPulse.com.au  Fact Sheet for Healthcare Providers: IncredibleEmployment.be  This test is not yet approved or cleared by the [place] FDA and has been authorized for detection and/or diagnosis of SARS-CoV-2 by FDA under an Emergency Use Authorization (EUA). This EUA will remain in effect (meaning this test can  be used) for the duration of the COVID-19 declaration under Section 564(b)(1) of the Act, 21 U.S.C. section 360bbb-3(b)(1), unless the authorization is terminated or revoked.  Performed at [facility], [place] [address], [place], [place] [address]     Radiology Studies: DG Chest 2 View  Result Date: 05/31/2020 CLINICAL DATA:  TIA EXAM: CHEST - 2 VIEW COMPARISON:  Radiograph 10/12/2018 FINDINGS: Diffuse pulmonary vascular congestion with hazy and streaky opacities in the mid to lower lungs and more patchy, coalescent opacity in the retrocardiac space. No pneumothorax or visible effusion. Biapical pleuroparenchymal scarring. Stable cardiomegaly with a calcified, tortuous aorta. The osseous structures appear diffusely demineralized which may limit detection of small or nondisplaced fractures. No acute osseous or soft tissue abnormality. Degenerative changes are present in the imaged spine and shoulders. Telemetry leads overlie the chest. IMPRESSION: Diffuse pulmonary vascular congestion with hazy and streaky opacities in the mid to lower lungs and more patchy, coalescent opacity in the retrocardiac space. Findings may reflect some developing edema and/or atelectasis. Infection less favored though not fully excluded in the appropriate clinical context. Electronically Signed   By: Lovena Le M.D.   On: 05/31/2020 00:11   CT Head Wo Contrast  Result Date: 05/30/2020 CLINICAL DATA:  Altered mental status, last known normal 12 hours ago EXAM: CT HEAD WITHOUT CONTRAST TECHNIQUE: Contiguous axial images were obtained from the base of the skull through the vertex without intravenous contrast. COMPARISON:  None. FINDINGS: Brain: No evidence of acute large vascular territory infarction, hemorrhage, hydrocephalus, extra-axial collection or mass lesion/mass effect. Age related global parenchymal volume loss with ex vacuo dilatation of ventricular system and prominent extra-axial spaces. There are  patchy subcortical and periventricular white matter hypodensities which are nonspecific but favored represent sequela of chronic ischemic small vessel white matter disease. Vascular: No hyperdense vessel. Atherosclerotic calcifications of the intracranial portions of the internal carotid and vertebral arteries. Skull: Hyperostosis frontalis. Negative for fracture or focal lesion. Sinuses/Orbits: The paranasal sinuses and mastoid air cells are predominantly clear. Pneumatized petrous apices. Orbits are grossly unremarkable. Other: None IMPRESSION: 1. No acute intracranial findings. 2. Age related global parenchymal volume loss and chronic ischemic small vessel white matter disease. Electronically Signed   By: Dahlia Bailiff MD   On: 05/30/2020 14:34   MR ANGIO HEAD WO CONTRAST  Result Date: 05/30/2020 CLINICAL DATA:  Neuro deficit, acute stroke suspected. EXAM: MRI HEAD WITHOUT CONTRAST MRA HEAD WITHOUT CONTRAST TECHNIQUE: Multiplanar, multiecho pulse sequences of the brain and surrounding structures were obtained without intravenous contrast. Angiographic images of the head were obtained using MRA technique without contrast. COMPARISON:  None. FINDINGS: MRI HEAD FINDINGS Brain: Multiple acute infarcts in the left MCA territory, including  acute infarct of the posterior left insula, left periventricular frontal lobe and left frontoparietal cortex superiorly. There is T2/FLAIR hyperintense mild edema associated with these infarcts. No substantial mass effect. No midline shift. Basal cisterns are patent. No evidence of acute hemorrhage. No extra-axial fluid collections. Additional moderate T2/FLAIR hyperintensities within the white matter and pons, nonspecific but most likely related to chronic microvascular ischemic disease. Frontal predominant cerebral volume loss with associated prominence of the overlying extra-axial spaces. Vascular: Major arterial flow voids are maintained at the skull base. Skull and upper  cervical spine: Normal marrow signal. Sinuses/Orbits: Clear sinuses.  Unremarkable orbits. Other: No sizable mastoid effusions. MRA HEAD FINDINGS Anterior circulation: Bilateral ICAs are patent through the carotid terminus. Bilateral M1 MCAs are patent. Left proximal M2 MCA inferior division is poorly visualized, but within an area of artifact. There is occlusion of two other proximal left M2 MCA branches in the left sylvian fissure (see series 24, images 41 and 45). Additionally, there is suspected occlusion of a more distal left M2/M3 MCA branch (series 24, image 30). Smaller left A1 ACA, likely congenital. Otherwise, patent ACAs. Possible small (approximately 2 mm) outpouching in the region of the right ICA terminus. Posterior circulation: Patent intradural vertebral arteries and basilar artery. Right fetal type PCA. Patent proximal posterior cerebral arteries with suspected mild stenosis of the right P2 PCA. IMPRESSION: 1. Left posterior MCA territory acute infarcts with occlusion of multiple left M2 and M2/M3 MCA branches, as described above. Associated edema without substantial mass effect. 2. Moderate chronic microvascular ischemic disease. 3. Frontal predominant cerebral volume loss. 4. Possible small (approximately 2 mm) aneurysm in the region of the right ICA terminus. A CTA could further characterize if clinically indicated Critical/urgent findings discussed with Dr. Rory Percy at 6:14 p.m via telephone. Electronically Signed   By: Margaretha Sheffield MD   On: 05/30/2020 18:43   MR BRAIN WO CONTRAST  Result Date: 05/30/2020 CLINICAL DATA:  Neuro deficit, acute stroke suspected. EXAM: MRI HEAD WITHOUT CONTRAST MRA HEAD WITHOUT CONTRAST TECHNIQUE: Multiplanar, multiecho pulse sequences of the brain and surrounding structures were obtained without intravenous contrast. Angiographic images of the head were obtained using MRA technique without contrast. COMPARISON:  None. FINDINGS: MRI HEAD FINDINGS Brain:  Multiple acute infarcts in the left MCA territory, including acute infarct of the posterior left insula, left periventricular frontal lobe and left frontoparietal cortex superiorly. There is T2/FLAIR hyperintense mild edema associated with these infarcts. No substantial mass effect. No midline shift. Basal cisterns are patent. No evidence of acute hemorrhage. No extra-axial fluid collections. Additional moderate T2/FLAIR hyperintensities within the white matter and pons, nonspecific but most likely related to chronic microvascular ischemic disease. Frontal predominant cerebral volume loss with associated prominence of the overlying extra-axial spaces. Vascular: Major arterial flow voids are maintained at the skull base. Skull and upper cervical spine: Normal marrow signal. Sinuses/Orbits: Clear sinuses.  Unremarkable orbits. Other: No sizable mastoid effusions. MRA HEAD FINDINGS Anterior circulation: Bilateral ICAs are patent through the carotid terminus. Bilateral M1 MCAs are patent. Left proximal M2 MCA inferior division is poorly visualized, but within an area of artifact. There is occlusion of two other proximal left M2 MCA branches in the left sylvian fissure (see series 24, images 41 and 45). Additionally, there is suspected occlusion of a more distal left M2/M3 MCA branch (series 24, image 30). Smaller left A1 ACA, likely congenital. Otherwise, patent ACAs. Possible small (approximately 2 mm) outpouching in the region of the right ICA terminus. Posterior circulation:  Patent intradural vertebral arteries and basilar artery. Right fetal type PCA. Patent proximal posterior cerebral arteries with suspected mild stenosis of the right P2 PCA. IMPRESSION: 1. Left posterior MCA territory acute infarcts with occlusion of multiple left M2 and M2/M3 MCA branches, as described above. Associated edema without substantial mass effect. 2. Moderate chronic microvascular ischemic disease. 3. Frontal predominant cerebral  volume loss. 4. Possible small (approximately 2 mm) aneurysm in the region of the right ICA terminus. A CTA could further characterize if clinically indicated Critical/urgent findings discussed with Dr. Rory Percy at 6:14 p.m via telephone. Electronically Signed   By: Margaretha Sheffield MD   On: 05/30/2020 18:43   ECHOCARDIOGRAM COMPLETE  Result Date: 05/31/2020    ECHOCARDIOGRAM REPORT   Patient Name:   DENIS KOPPEL Date of Exam: 05/31/2020 Medical Rec #:  [id number]             Height:       62.0 in Accession #:    [id number]            Weight:       169.3 lb Date of Birth:  1930-06-24             BSA:          1.781 m Patient Age:    18 years              BP:           129/77 mmHg Patient Gender: F                     HR:           103 bpm. Exam Location:  Inpatient Procedure: 2D Echo, Cardiac Doppler and Color Doppler Indications:    Stroke I63.9  History:        Patient has prior history of Echocardiogram examinations, most                 recent 10/13/2019. Arrythmias:Atrial Fibrillation.  Sonographer:    Tiffany Dance Referring Phys: [id number] [place]  1. Left ventricular ejection fraction, by estimation, is 55 to 60%. The left ventricle has normal function. The left ventricle has no regional wall motion abnormalities. Left ventricular diastolic function could not be evaluated.  2. Right ventricular systolic function is moderately reduced. The right ventricular size is mildly enlarged. There is severely elevated pulmonary artery systolic pressure. The estimated right ventricular systolic pressure is 53.9 mmHg.  3. Left atrial size was severely dilated.  4. Right atrial size was severely dilated.  5. The pericardial effusion is posterior to the left ventricle.  6. The mitral valve is normal in structure. Mild mitral valve regurgitation. No evidence of mitral stenosis.  7. Tricuspid valve regurgitation is mild to moderate.  8. The aortic valve is normal in structure. Aortic valve  regurgitation is not visualized. No aortic stenosis is present.  9. The inferior vena cava is dilated in size with >50% respiratory variability, suggesting right atrial pressure of 8 mmHg. FINDINGS  Left Ventricle: Left ventricular ejection fraction, by estimation, is 55 to 60%. The left ventricle has normal function. The left ventricle has no regional wall motion abnormalities. The left ventricular internal cavity size was normal in size. There is  no left ventricular hypertrophy. Left ventricular diastolic function could not be evaluated due to atrial fibrillation. Left ventricular diastolic function could not be evaluated. Right Ventricle: The right ventricular size is mildly enlarged. No increase in right  ventricular wall thickness. Right ventricular systolic function is moderately reduced. There is severely elevated pulmonary artery systolic pressure. The tricuspid regurgitant velocity is 3.69 m/s, and with an assumed right atrial pressure of 8 mmHg, the estimated right ventricular systolic pressure is 02.4 mmHg. Left Atrium: Left atrial size was severely dilated. Right Atrium: Right atrial size was severely dilated. Pericardium: Trivial pericardial effusion is present. The pericardial effusion is posterior to the left ventricle. Mitral Valve: The mitral valve is normal in structure. Mild mitral valve regurgitation. No evidence of mitral valve stenosis. Tricuspid Valve: The tricuspid valve is normal in structure. Tricuspid valve regurgitation is mild to moderate. No evidence of tricuspid stenosis. Aortic Valve: The aortic valve is normal in structure. Aortic valve regurgitation is not visualized. No aortic stenosis is present. Pulmonic Valve: The pulmonic valve was normal in structure. Pulmonic valve regurgitation is trivial. No evidence of pulmonic stenosis. Aorta: The aortic root is normal in size and structure. Venous: The inferior vena cava is dilated in size with greater than 50% respiratory variability,  suggesting right atrial pressure of 8 mmHg. IAS/Shunts: The interatrial septum appears to be lipomatous. No atrial level shunt detected by color flow Doppler.  LEFT VENTRICLE PLAX 2D LVIDd:         4.20 cm LVIDs:         3.30 cm LV PW:         1.10 cm LV IVS:        1.10 cm LVOT diam:     1.80 cm LV SV:         33 LV SV Index:   18 LVOT Area:     2.54 cm  RIGHT VENTRICLE          IVC RV Basal diam:  3.60 cm  IVC diam: 2.60 cm RV Mid diam:    2.60 cm TAPSE (M-mode): 1.3 cm LEFT ATRIUM             Index       RIGHT ATRIUM           Index LA diam:        5.70 cm 3.20 cm/m  RA Area:     30.60 cm LA Vol (A2C):   93.1 ml 52.27 ml/m RA Volume:   110.00 ml 61.76 ml/m LA Vol (A4C):   81.0 ml 45.48 ml/m LA Biplane Vol: 87.7 ml 49.24 ml/m  AORTIC VALVE LVOT Vmax:   69.45 cm/s LVOT Vmean:  45.100 cm/s LVOT VTI:    0.129 m  AORTA Ao Root diam: 3.00 cm Ao Asc diam:  3.00 cm MITRAL VALVE                TRICUSPID VALVE MV Area (PHT): 3.27 cm     TR Peak grad:   54.5 mmHg MV Decel Time: 232 msec     TR Vmax:        369.00 cm/s MV E velocity: 112.00 cm/s MV A velocity: 41.20 cm/s   SHUNTS MV E/A ratio:  2.72         Systemic VTI:  0.13 m                             Systemic Diam: 1.80 cm Fransico Him MD Electronically signed by Fransico Him MD Signature Date/Time: 05/31/2020/3:43:30 PM    Final    VAS US CAROTID  Result Date: 05/31/2020 Carotid Arterial Duplex Study Indications: CVA. Performing Technologist: Jinny Blossom  [facility] RV[facility]  Examination Guidelines: A complete evaluation includes B-mode imaging, spectral Doppler, color Doppler, and power Doppler as needed of all accessible portions of each vessel. Bilateral testing is considered an integral part of a complete examination. Limited examinations for reoccurring indications may be performed as noted.  Right Carotid Findings: +----------+--------+--------+--------+------------------+--------+           PSV cm/sEDV cm/sStenosisPlaque DescriptionComments  +----------+--------+--------+--------+------------------+--------+ CCA Prox  41      9               heterogenous               +----------+--------+--------+--------+------------------+--------+ CCA Distal41      10              heterogenous               +----------+--------+--------+--------+------------------+--------+ ICA Prox  73      18      1-39%   heterogenous               +----------+--------+--------+--------+------------------+--------+ ICA Distal93      24                                         +----------+--------+--------+--------+------------------+--------+ ECA       90                                                 +----------+--------+--------+--------+------------------+--------+ +----------+--------+-------+--------+-------------------+           PSV cm/sEDV cmsDescribeArm Pressure (mmHG) +----------+--------+-------+--------+-------------------+ [id number]                                         +----------+--------+-------+--------+-------------------+ +---------+--------+--+--------+--+---------+ VertebralPSV cm/s66EDV cm/s10Antegrade +---------+--------+--+--------+--+---------+  Left Carotid Findings: +----------+--------+--------+--------+------------------+--------+           PSV cm/sEDV cm/sStenosisPlaque DescriptionComments +----------+--------+--------+--------+------------------+--------+ CCA Prox  48      7               heterogenous               +----------+--------+--------+--------+------------------+--------+ CCA Distal33      5               heterogenous               +----------+--------+--------+--------+------------------+--------+ ICA Prox  102     22      1-39%   heterogenous               +----------+--------+--------+--------+------------------+--------+ ICA Distal62      19                                          +----------+--------+--------+--------+------------------+--------+ ECA       71      8                                          +----------+--------+--------+--------+------------------+--------+ +----------+--------+--------+--------+-------------------+           PSV cm/sEDV  cm/sDescribeArm Pressure (mmHG) +----------+--------+--------+--------+-------------------+ [id number]                                          +----------+--------+--------+--------+-------------------+ +---------+--------+--+--------+--+---------+ VertebralPSV cm/s39EDV cm/s13Antegrade +---------+--------+--+--------+--+---------+   Summary: Right Carotid: Velocities in the right ICA are consistent with a 1-39% stenosis. Left Carotid: Velocities in the left ICA are consistent with a 1-39% stenosis. Vertebrals: Bilateral vertebral arteries demonstrate antegrade flow. *See table(s) above for measurements and observations.  Electronically signed by Jamelle Haring on 05/31/2020 at 4:30:40 PM.    Final    Scheduled Meds: . aspirin EC  81 mg Oral Daily  . atorvastatin  20 mg Oral Daily  . enoxaparin (LOVENOX) injection  40 mg Subcutaneous QHS  . levothyroxine  112 mcg Oral q morning  . pantoprazole  40 mg Oral Daily  . sertraline  50 mg Oral Daily     LOS: 2 days   Time spent: 45min  Brynlea Spindler C Jaymar Loeber, DO [facility]  If 7PM-7AM, please contact night-coverage [email or web address]  06/01/2020, 7:24 AM

## 2020-06-01 NOTE — Progress Notes (Addendum)
STROKE TEAM PROGRESS NOTE   INTERVAL HISTORY Daughter is at the bedside. Pt still has expressive aphasia with intermittent word finding difficulty and paraphasic errors. However, able to double check with pt and her daughter that pt had iron deficiency anemia before and received several iron infusion when she lived in [place] 3-4 years ago. She never had blood transfusion before. Last year she had upper and lower GI work up negative and her anemia now improved and stable. Discussed about DOAC for stroke prevention, they are in agreement. Will also close monitor CBC after stating DOAC.  Vitals:   06/01/20 0420 06/01/20 0752 06/01/20 0755 06/01/20 0905  BP: (!) 143/88 (!) 145/103 (!) 146/115 (!) 119/59  Pulse: 86 78 97 97  Resp: 16   20  Temp:  98.6 F (37 C)    TempSrc:  Oral    SpO2: 94% 96% 95% 96%  Weight:      Height:       CBC:  Recent Labs  Lab 05/30/20 1338 05/31/20 0022 06/01/20 0112  WBC 8.1 8.6 6.9  NEUTROABS 6.6  --   --   HGB 12.3 12.2 11.5*  HCT 40.7 39.5 36.7  MCV 90.0 88.4 87.6  PLT 196 137* 956   Basic Metabolic Panel:  Recent Labs  Lab 05/30/20 1338 05/31/20 0022 06/01/20 0112  NA 139  --  140  K 3.4*  --  3.8  CL 106  --  109  CO2 23  --  23  GLUCOSE 118*  --  115*  BUN 16  --  11  CREATININE 0.91 0.82 1.02*  CALCIUM 8.8*  --  8.7*   Lipid Panel:  Recent Labs  Lab 05/31/20 0022  CHOL 171  TRIG 69  HDL 54  CHOLHDL 3.2  VLDL 14  LDLCALC 103*   HgbA1c:  Recent Labs  Lab 05/31/20 0022  HGBA1C 6.2*   PHYSICAL EXAM  Temp:  [97.8 F (36.6 C)-98.6 F (37 C)] 98.6 F (37 C) (04/21 0752) Pulse Rate:  [78-101] 97 (04/21 0905) Resp:  [16-20] 20 (04/21 0905) BP: (119-148)/(59-115) 119/59 (04/21 0905) SpO2:  [94 %-99 %] 96 % (04/21 0905)  General - Well nourished, well developed, in no apparent distress.  Ophthalmologic - fundi not visualized due to noncooperation.  Cardiovascular - irregularly irregular heart rate and rhythm.  Neuro -  patient awake alert, orientated, however still has expressive aphasia with frequent word finding difficulty and paraphasic errors.  Follow simple commands, able to name but was difficulty reputation.  No gaze palsy, no visual field deficit, facial symmetrical.  Patient did feel strange feeling of right arm but no weakness right arm or leg.  Sensation symmetrical, finger-to-nose intact.   ASSESSMENT/PLAN 87 y.o. female with PMH significant for permanent atrial fibrillation with RVT not on AC, HTN, HLD not on statin, orthostatic hypotension, symptomatic recurrent anemia, GERD, depression, breast cancer, hypothyroidism,  presenting with aphasia and right sided sensory loss.  Stroke - Left MCA territory stroke likely  due to known chronic atrial fibrillation off anticoagulation due to recurrent symptomatic anemia.  MRI showed left MCA infarct.    MRA showed left M2 occlusion.    Carotid Doppler unremarkable.    EF 55 to 60%.    LDL 103  A1c 6.2.    No antithrombotics PTA, currently on aspirin 81.  Will switch to eliquis after discussion with pt and daughter  VTE prophylaxis - Lovenox PPX -> eliquis  Disposition:  CIR  Permanent atrial fibrillation  Not on AC/AP prior to admission d/t poor candidacy related to recurrent symptomatic anemia believed to be from an unknown GI source.   [name] saw her Jan/22 for [facility] consideration. Patient has not decided yet. (pt husband suffered significant side effect from a small limb procedure, so pt is hesitant for any procedure)  Contacted with [name], he has no objection to [place] but recommend close monitoring for bleeding and Hb.    Will start eliquis today  History of symptomatic recurrent anemia of unknown etiology but contributed to GI source  Had several iron infusion in the past but no blood transfusion  10/2019 admitted for anemia but upper and lower GI work up all negative. Presumed GI source but work up neg  Currently  stable Hb 12.2->11.5  Will close monitor CBC after starting eliquis  HTN Hx of orthostatic hypotension . Stable BP . Long-term BP goal normotensive  Hyperlipidemia  Home meds: None  LDL 103, not at goal < 70  On Lipitor 20mg  given advanced age and relatively low LDL  Continue statin at discharge  Other Stroke Risk Factors  Advanced Age >/= 2   Former Cigarette smoker  Current ETOH use, advised to drink no more than 1 drink per day  Obesity, Body mass index is 30.97 kg/m., BMI >/= 30 associated with increased stroke risk, recommend weight loss, diet and exercise as appropriate   Hospital day # 2  Neurology will sign off. Please call with questions. Pt will follow up with stroke clinic NP at [facility] in about 4 weeks. Thanks for the consult.  Rosalin Hawking, MD PhD Stroke Neurology 06/01/2020 4:06 PM  I had long discussion with daughter and patient at bedside, updated pt current condition, treatment plan and potential prognosis, and answered all the questions. They expressed understanding and appreciation. I also discussed with Dr. Avon Gully. I spent  35 minutes in total face-to-face time with the patient, more than 50% of which was spent in counseling and coordination of care, reviewing test results, images and medication, and discussing the diagnosis, treatment plan and potential prognosis. This patient's care requiresreview of multiple databases, neurological assessment, discussion with family, other specialists and medical decision making of high complexity.     To contact Stroke Continuity provider, please refer to [email or web address] After hours, contact General Neurology

## 2020-06-01 NOTE — Progress Notes (Signed)
Physical Therapy Treatment Patient Details Name: Katrina Randolph MRN: [id number] DOB: 1930-11-12 Today's Date: 06/01/2020    History of Present Illness This 87 y.o. female admitted with AMS, decreased balance, and speech difficulties.  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  PMH includes: A-fib, HTN, and anemia    PT Comments    Pt motivated to work with PT today. Pt ambulatory for x2 short hallway distances with RW, requiring moderate assistance for balance, use of RW, and rest breaks as needed. Pt presents with RLE incoordination and impaired sensation, evident in poor muscle grading during gait. Pt in afib with extreme tachycardia up to 140s-150s with associated DOE and mild dizziness, RN notified and seated rest break taken.  Pt with worsening balance, coordination, and expressive aphasia with fatigue. Pt remains a great CIR candidate, is motivated to return to PLOF.   Follow Up Recommendations  CIR     Equipment Recommendations  Other (comment) (TBD pending progression)    Recommendations for Other Services Rehab consult     Precautions / Restrictions Precautions Precautions: Fall Restrictions Weight Bearing Restrictions: No    Mobility  Bed Mobility Overal bed mobility: Needs Assistance Bed Mobility: Supine to Sit     Supine to sit: Min guard;HOB elevated     General bed mobility comments: for safety, vc for sequencing task and increased time to scoot to EOB    Transfers Overall transfer level: Needs assistance Equipment used: 1 person hand held assist Transfers: Sit to/from Stand Sit to Stand: Min assist;From elevated surface         General transfer comment: min assist for rise and steady, increased time to perform. STS x2, from EOB and toilet  Ambulation/Gait Ambulation/Gait assistance: Mod assist Gait Distance (Feet): 50 Feet (x2) Assistive device: 1 person  hand held assist;Rolling walker (2 wheeled) Gait Pattern/deviations: Step-through pattern;Decreased stride length;Staggering right Gait velocity: decr   General Gait Details: mod assist to steady, correct heavy R bias, and correct LOB x1. seated rest break x1 to recover fatigue and tachycardia to 140s bpm. VC for upright posture, placement inside RW.   Stairs             Wheelchair Mobility    Modified Rankin (Stroke Patients Only) Modified Rankin (Stroke Patients Only) Pre-Morbid Rankin Score: No symptoms Modified Rankin: Moderately severe disability     Balance Overall balance assessment: Needs assistance Sitting-balance support: No upper extremity supported;Feet supported Sitting balance-Leahy Scale: Fair Sitting balance - Comments: Required min A for dynamic balance.   Standing balance support: No upper extremity supported;During functional activity Standing balance-Leahy Scale: Poor Standing balance comment: reliant on external support                            Cognition Arousal/Alertness: Awake/alert Behavior During Therapy: WFL for tasks assessed/performed Overall Cognitive Status: Difficult to assess                                 General Comments: follows all commands appropriately, requires cues for safety intermittently and presents with some R inattention      Exercises General Exercises - Lower Extremity Long Arc Quad: AAROM;Right;10 reps;Seated (with cues for halfway range intermittently for motor grade attempt)    General Comments        Pertinent Vitals/Pain Pain Assessment: Faces  Faces Pain Scale: Hurts a little bit Pain Descriptors / Indicators: Discomfort;Other (Comment) (altered sensation, "doesn't feel right") Pain Intervention(s): Limited activity within patient's tolerance;Monitored during session;Repositioned    Home Living                      Prior Function            PT Goals (current goals  can now be found in the care plan section) Acute Rehab PT Goals Patient Stated Goal: to get stronger prior to return home PT Goal Formulation: With patient/family Time For Goal Achievement: 06/14/20 Potential to Achieve Goals: Good Progress towards PT goals: Progressing toward goals    Frequency    Min 4X/week      PT Plan Current plan remains appropriate    Co-evaluation              AM-PAC PT "6 Clicks" Mobility   Outcome Measure  Help needed turning from your back to your side while in a flat bed without using bedrails?: A Little Help needed moving from lying on your back to sitting on the side of a flat bed without using bedrails?: A Little Help needed moving to and from a bed to a chair (including a wheelchair)?: A Little Help needed standing up from a chair using your arms (e.g., wheelchair or bedside chair)?: A Little Help needed to walk in hospital room?: A Lot Help needed climbing 3-5 steps with a railing? : Total 6 Click Score: 15    End of Session Equipment Utilized During Treatment: Gait belt Activity Tolerance: Patient tolerated treatment well Patient left: in chair;with call bell/phone within reach;with chair alarm set;with family/visitor present Nurse Communication: Mobility status PT Visit Diagnosis: Unsteadiness on feet (R26.81);Muscle weakness (generalized) (M62.81);Difficulty in walking, not elsewhere classified (R26.2);Other symptoms and signs involving the nervous system (R29.898)     Time: 2446-2863 PT Time Calculation (min) (ACUTE ONLY): 38 min  Charges:  $Gait Training: 8-22 mins $Therapeutic Activity: 8-22 mins                    Katrina Randolph, PT DPT Acute Rehabilitation Services Pager [phone]  Office [phone]   Louis Matte 06/01/2020, 4:57 PM

## 2020-06-01 NOTE — Progress Notes (Signed)
ANTICOAGULATION CONSULT NOTE - Initial Consult  Pharmacy Consult for Apixaban Indication: atrial fibrillation  Allergies  Allergen Reactions  . Sulfa Antibiotics     "feels bad"    Patient Measurements: Height: 5\' 2"  (157.5 cm) (pt stated) Weight: 76.8 kg (169 lb 5 oz) IBW/kg (Calculated) : 50.1   Vital Signs: Temp: 98.6 F (37 C) (04/21 0752) Temp Source: Oral (04/21 0752) BP: 119/59 (04/21 0905) Pulse Rate: 97 (04/21 0905)  Labs: Recent Labs    05/30/20 1338 05/31/20 0022 06/01/20 0112  HGB 12.3 12.2 11.5*  HCT 40.7 39.5 36.7  PLT 196 137* 175  CREATININE 0.91 0.82 1.02*    Estimated Creatinine Clearance: 37.3 mL/min (A) (by C-G formula based on SCr of 1.02 mg/dL (H)).   Medical History: Past Medical History:  Diagnosis Date  . Atrial fibrillation ([place])     Assessment: 87 y.o.femalewith a known history of atrial fibrillation not on anticoagulation, hypothyroidismpresents to the emergency department for evaluation of altered mental status.  Found to have Acute left posterior MCACVA. Pharmacy consulted to start apixaban therapy.  Patient is 87, but weighs > 65 kg and Screat is not >1.5 - so he does not meet requirements for reduced dose apixaban.     Plan:  Start Apixaban 5 mg po bid (first dose tonight) D/C enoxaparin Monitor for signs and symptoms of bleeding  Alanda Slim, PharmD, [facility] Clinical Pharmacist Please see AMION for all Pharmacists' Contact Phone Numbers 06/01/2020, 4:15 PM

## 2020-06-01 NOTE — Progress Notes (Signed)
  Speech Language Pathology Treatment: Dysphagia  Patient Details Name: Katrina Randolph MRN: [id number] DOB: August 09, 1930 Today's Date: 06/01/2020 Time: 0852-0909 SLP Time Calculation (min) (ACUTE ONLY): 17 min  Assessment / Plan / Recommendation Clinical Impression  Pt seen for PO trials and to determine if instrumental testing is warranted. RN student reports pt coughing with meds whole with liquids this am and stated that she requires cueing to take small sips rather than large gulps as recommended. Pt upright and alert in bed, able to self feed straw sips of thin liquids with this clinician. No overt s/sx of aspiration noted with individual small sips and consecutive sips via straw, however given inconsistency with performance, questionable pulmonary status and recent admission for acute CVA, recommend instrumental swallow examination to assess current swallow function and aspiration risk. Will complete this am.    HPI HPI: Pt is an 87 y.o. female with a PMHx of permanent atrial fibrillation with RVT not on AC, HTN, HLD not on statin, orthostatic hypotension, symptomatic recurrent anemia, GERD, depression, breast cancer, hypothyroidism,  who presented to the [facility] ED with a chief complaint of inability to communicate effectively and decreased sensation on her right side. Daughter also noted some difficulty walking and right sided weakness. She was out of the time window for tPA at the time of arrival to the ED. MRI brain revealed left posterior MCA territory acute infarctions with occlusion of multiple left M2 and M2/M3 branches.  Chest CT (05/30/20) revealed "Diffuse pulmonary vascular congestion with hazy and streaky opacities in the mid to lower lungs and more patchy, coalescent opacity in the retrocardiac space. Findings may reflect some developing edema and/or atelectasis. Infection less favored though not fully excluded in the appropriate clinical context. Per NP note (05/31/20), "passed nursing  bedside swallow but coughing during my visit today with liquids". BSE requested.      SLP Plan  MBS       Recommendations  Diet recommendations: Regular;Thin liquid Liquids provided via: Straw;Cup Medication Administration: Whole meds with liquid Supervision: Patient able to self feed Compensations: Slow rate;Small sips/bites Postural Changes and/or Swallow Maneuvers: Seated upright 90 degrees                Oral Care Recommendations: Oral care BID Follow up Recommendations: Inpatient Rehab SLP Visit Diagnosis: Dysphagia, unspecified (R13.10) Plan: MBS       GO               Ellwood Dense, [place], [place] Office Number: [phone]  Acie Fredrickson 06/01/2020, 9:25 AM

## 2020-06-01 NOTE — Progress Notes (Signed)
IP rehab admissions - I met with patient and her daughter at the bedside.  They would like inpatient rehab.  An MD rehab consult is pending.  I have called the insurance carrier to request inpatient rehab admission.  Will await rehab consult and will await call back from insurance case manager.  I will have my partner follow up tomorrow.  Call for questions.  [phone]

## 2020-06-02 LAB — CBC
HCT: 38.4 % (ref 36.0–46.0)
Hemoglobin: 11.8 g/dL — ABNORMAL LOW (ref 12.0–15.0)
MCH: 27.1 pg (ref 26.0–34.0)
MCHC: 30.7 g/dL (ref 30.0–36.0)
MCV: 88.3 fL (ref 80.0–100.0)
Platelets: 174 10*3/uL (ref 150–400)
RBC: 4.35 MIL/uL (ref 3.87–5.11)
RDW: 15.8 % — ABNORMAL HIGH (ref 11.5–15.5)
WBC: 6.8 10*3/uL (ref 4.0–10.5)
nRBC: 0 % (ref 0.0–0.2)

## 2020-06-02 MED ORDER — METOPROLOL SUCCINATE ER 25 MG PO TB24: 25.0000 mg | ORAL_TABLET | Freq: Every day | ORAL | 0 refills | Status: DC

## 2020-06-02 MED ORDER — ATORVASTATIN CALCIUM 10 MG PO TABS: 20.0000 mg | ORAL_TABLET | Freq: Every day | ORAL | 0 refills | Status: DC

## 2020-06-02 MED ORDER — APIXABAN 5 MG PO TABS: 5.0000 mg | ORAL_TABLET | Freq: Two times a day (BID) | ORAL | 0 refills | Status: DC

## 2020-06-02 NOTE — Progress Notes (Signed)
Inpatient Rehab Admissions Coordinator:   I have a bed for this patient on CIR and will plan to admit her to CIR on Saturday. RN may call report to [phone] after 12pm Saturday.  Clemens Catholic, [place], [place] Admissions Coordinator  [phone] ([place]) [phone] (office)

## 2020-06-02 NOTE — Progress Notes (Signed)
Occupational Therapy Treatment Patient Details Name: Katrina Randolph MRN: [id number] DOB: 1930/06/29 Today's Date: 06/02/2020    History of present illness This 87 y.o. female admitted with AMS, decreased balance, and speech difficulties.  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  PMH includes: A-fib, HTN, and anemia   OT comments  Pt making progress with functional goals. Pt in bed upon arrival and agreeable to OOB activity. Session focused on ADL functional mobility to bathroom with RW, toilet transfers, toileting, standing at sink for grooming/hygiene. Pt seated in recliner for UB ADLs. Pt very pleasant and cooperative. OT will continue to follow acutely to maximize level of function and safety  Follow Up Recommendations  CIR;Supervision/Assistance - 24 hour    Equipment Recommendations  Other (comment) (TBD at CIR)    Recommendations for Other Services      Precautions / Restrictions Precautions Precautions: Fall Restrictions Weight Bearing Restrictions: No       Mobility Bed Mobility Overal bed mobility: Needs Assistance Bed Mobility: Supine to Sit     Supine to sit: Min guard;HOB elevated     General bed mobility comments: for safety, vc for sequencing task and increased time to scoot to EOB    Transfers Overall transfer level: Needs assistance Equipment used: 1 person hand held assist;Rolling walker (2 wheeled) Transfers: Sit to/from Stand Sit to Stand: Min assist              Balance Overall balance assessment: Needs assistance Sitting-balance support: No upper extremity supported;Feet supported Sitting balance-Leahy Scale: Fair     Standing balance support: No upper extremity supported;During functional activity Standing balance-Leahy Scale: Poor                             ADL either performed or assessed with clinical judgement   ADL Overall  ADL's : Needs assistance/impaired     Grooming: Wash/dry hands;Wash/dry face;Oral care;Min guard;Standing;Cueing for safety;Cueing for sequencing           Upper Body Dressing : Min guard;Sitting;Cueing for sequencing;Cueing for safety   Lower Body Dressing: Minimal assistance;Sitting/lateral leans Lower Body Dressing Details (indicate cue type and reason): min A to manage socks Toilet Transfer: Ambulation;Comfort height toilet;Grab bars;RW;Minimal assistance;Cueing for safety   Toileting- Clothing Manipulation and Hygiene: Minimal assistance;Sit to/from stand       Functional mobility during ADLs: Minimal assistance;Cueing for safety;Cueing for sequencing;Rolling walker       Vision Patient Visual Report: No change from baseline     Perception     Praxis      Cognition Arousal/Alertness: Awake/alert Behavior During Therapy: WFL for tasks assessed/performed Overall Cognitive Status: Impaired/Different from baseline Area of Impairment: Attention;Safety/judgement;Following commands                         Safety/Judgement: Decreased awareness of safety;Decreased awareness of deficits     General Comments: R inattention        Exercises     Shoulder Instructions       General Comments      Pertinent Vitals/ Pain       Pain Assessment: No/denies pain Pain Score: 0-No pain Pain Intervention(s): Monitored during session;Repositioned  Home Living  Prior Functioning/Environment              Frequency  Min 2X/week        Progress Toward Goals  OT Goals(current goals can now be found in the care plan section)  Progress towards OT goals: Progressing toward goals     Plan Discharge plan remains appropriate    Co-evaluation                 AM-PAC OT "6 Clicks" Daily Activity     Outcome Measure   Help from another person eating meals?: A Little Help from another  person taking care of personal grooming?: A Little Help from another person toileting, which includes using toliet, bedpan, or urinal?: A Little Help from another person bathing (including washing, rinsing, drying)?: A Lot Help from another person to put on and taking off regular upper body clothing?: A Little Help from another person to put on and taking off regular lower body clothing?: A Lot 6 Click Score: 16    End of Session Equipment Utilized During Treatment: Gait belt;Rolling walker  OT Visit Diagnosis: Unsteadiness on feet (R26.81);Cognitive communication deficit (R41.841) Symptoms and signs involving cognitive functions: Cerebral infarction   Activity Tolerance Patient tolerated treatment well   Patient Left in chair;with call bell/phone within reach;with chair alarm set   Nurse Communication          Time: 8144-8185 OT Time Calculation (min): 27 min  Charges: OT General Charges $OT Visit: 1 Visit OT Treatments $Self Care/Home Management : 8-22 mins $Therapeutic Activity: 8-22 mins   Britt Bottom 06/02/2020, 12:50 PM

## 2020-06-02 NOTE — Progress Notes (Signed)
Physical Therapy Treatment Patient Details Name: Katrina Randolph MRN: [id number] DOB: 1930-08-07 Today's Date: 06/02/2020    History of Present Illness This 87 y.o. female admitted with AMS, decreased balance, and speech difficulties.  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  PMH includes: A-fib, HTN, and anemia.    PT Comments    Pt received up in recliner resting, wakes easily and is agreeable to mobility. Pt continuing to present with R inattention with altered sensation to RUE/LE, pt reporting intermittent pain along R side since CVA as well. Pt ambulatory for x2 short hallway distances, seated rest break required given mild dyspnea on exertion and HR in afib to 150s. Pt demonstrating significant imbalance during gait and standing exercise during session especially when RW is removed, improved with PT cuing and physical assist. Pt with worsening expressive aphasia with fatigue, improved with rest. PT to continue to recommend CIR level of therapies post-acutely, will continue to follow.    Follow Up Recommendations  CIR     Equipment Recommendations  Other (comment) (TBD pending progression)    Recommendations for Other Services Rehab consult     Precautions / Restrictions Precautions Precautions: Fall Restrictions Weight Bearing Restrictions: No    Mobility  Bed Mobility Overal bed mobility: Needs Assistance Bed Mobility: Supine to Sit     Supine to sit: Min guard;HOB elevated     General bed mobility comments: pt up in chair upon PT arrival to room, remains in chair at PT exit    Transfers Overall transfer level: Needs assistance Equipment used: Rolling walker (2 wheeled) Transfers: Sit to/from Stand Sit to Stand: Min assist         General transfer comment: min assist to steady upon standing, STS x3 (from recliner x2 and chair in hallway x1). Verbal cuing for hand  placement when rising/lowering, followed inconsistently due to increased processing time.  Ambulation/Gait Ambulation/Gait assistance: Min assist;Mod assist Gait Distance (Feet): 50 Feet (x2 - seated rest break to recover tachycardia, fatigue) Assistive device: Rolling walker (2 wheeled);1 person hand held assist Gait Pattern/deviations: Step-through pattern;Decreased stride length;Staggering right;Narrow base of support Gait velocity: decr   General Gait Details: min assist with RW for steadying, guiding pt and RW trajectory, cuing for upright posture and placement within RW. Seated rest due to HR to150s bpm in afib rhythm during gait. 2nd bout of gait with R HHA and mod assist to steady, correct staggering towards R. verbal cuing for widening BOS, demonstrating improved heel-toe gait without RW but increased unsteadiness.   Stairs             Wheelchair Mobility    Modified Rankin (Stroke Patients Only) Modified Rankin (Stroke Patients Only) Pre-Morbid Rankin Score: No symptoms Modified Rankin: Moderately severe disability     Balance Overall balance assessment: Needs assistance Sitting-balance support: No upper extremity supported;Feet supported Sitting balance-Leahy Scale: Fair     Standing balance support: No upper extremity supported;During functional activity Standing balance-Leahy Scale: Poor                              Cognition Arousal/Alertness: Awake/alert Behavior During Therapy: WFL for tasks assessed/performed Overall Cognitive Status: Impaired/Different from baseline Area of Impairment: Safety/judgement;Following commands;Problem solving                       Following Commands: Follows one step  commands with increased time Safety/Judgement: Decreased awareness of safety;Decreased awareness of deficits   Problem Solving: Requires verbal cues;Requires tactile cues General Comments: R inattention, difficulty following multistep  mobility commands at times or follows in a delayed manner.      Exercises Other Exercises Other Exercises: Standing box taps, x10 bilaterally, with cuing for motor grading. heavy R leaning in SLS with RLE requiring mod PT assist to correct    General Comments        Pertinent Vitals/Pain Pain Assessment: Faces Pain Score: 0-No pain Faces Pain Scale: Hurts little more Pain Location: RUE/LE Pain Descriptors / Indicators: Discomfort;Other (Comment) Pain Intervention(s): Limited activity within patient's tolerance;Monitored during session;Repositioned    Home Living                      Prior Function            PT Goals (current goals can now be found in the care plan section) Acute Rehab PT Goals Patient Stated Goal: to get stronger prior to return home PT Goal Formulation: With patient/family Time For Goal Achievement: 06/14/20 Potential to Achieve Goals: Good Progress towards PT goals: Progressing toward goals    Frequency    Min 4X/week      PT Plan Current plan remains appropriate    Co-evaluation              AM-PAC PT "6 Clicks" Mobility   Outcome Measure  Help needed turning from your back to your side while in a flat bed without using bedrails?: A Little Help needed moving from lying on your back to sitting on the side of a flat bed without using bedrails?: A Little Help needed moving to and from a bed to a chair (including a wheelchair)?: A Little Help needed standing up from a chair using your arms (e.g., wheelchair or bedside chair)?: A Little Help needed to walk in hospital room?: A Lot Help needed climbing 3-5 steps with a railing? : A Lot 6 Click Score: 16    End of Session Equipment Utilized During Treatment: Gait belt Activity Tolerance: Patient tolerated treatment well Patient left: in chair;with call bell/phone within reach;with chair alarm set;with family/visitor present Nurse Communication: Mobility status PT Visit  Diagnosis: Unsteadiness on feet (R26.81);Muscle weakness (generalized) (M62.81);Difficulty in walking, not elsewhere classified (R26.2);Other symptoms and signs involving the nervous system (R29.898)     Time: 1610-9604 PT Time Calculation (min) (ACUTE ONLY): 26 min  Charges:  $Gait Training: 8-22 mins $Neuromuscular Re-education: 8-22 mins                    Stacie Glaze, PT DPT Acute Rehabilitation Services Pager [phone]  Office [phone]   [place] 06/02/2020, 2:42 PM

## 2020-06-02 NOTE — H&P (Addendum)
Physical Medicine and Rehabilitation Admission H&P    Chief Complaint  Patient presents with  . Altered Mental Status  : HPI: Nuvia Uhr is an 87 year old right-handed female with history of atrial fibrillation not on anticoagulation as patient did see [name] in the past for possible watchman's consideration of which patient declined, hypothyroidism, tobacco use.  Per chart review lives alone 1 level home one-step to entry.  Independent with assistive device.  She is independent with homemaking ADLs and still drives.  Presented 05/30/2020 with aphasia and decrease in balance as well as decreased sensation on her right side of acute onset.  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  Moderate chronic microvascular ischemic disease.  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  Patient did not receive tPA.  Admission chemistries unremarkable except potassium 3.4 glucose 118.  Echocardiogram with ejection fraction of 55 to 60% no wall motion abnormalities.  Carotid Dopplers with no ICA stenosis.  Initially maintained on aspirin for CVA prophylaxis and changed to Eliquis in light of history of atrial fibrillation.  Patient was to be admitted for 23 2022 but held due to developing RVR and was held to be monitored on telemetry no changes were made except the addition of Toprol and patient remained on Eliquis.   Tolerating a regular consistency diet.  Due to patient's decreased functional ability aphasia was admitted for a comprehensive rehab program. No complaints this morning.   Review of Systems  Constitutional: Negative for chills and fever.  HENT: Negative for hearing loss.   Eyes: Negative for blurred vision and double vision.  Respiratory: Negative for cough and shortness of breath.   Cardiovascular: Positive for palpitations. Negative for chest pain and  leg swelling.  Gastrointestinal: Positive for constipation. Negative for heartburn, nausea and vomiting.  Genitourinary: Negative for dysuria, flank pain and hematuria.  Musculoskeletal: Positive for joint pain and myalgias.  Skin: Negative for rash.  Neurological: Positive for speech change and weakness.  Psychiatric/Behavioral: Positive for depression. The patient has insomnia.   All other systems reviewed and are negative.  Past Medical History:  Diagnosis Date  . Atrial fibrillation [facility])    Past Surgical History:  Procedure Laterality Date  . ABDOMINAL HYSTERECTOMY    . BIOPSY  10/19/2019   Procedure: BIOPSY;  Surgeon: [name], MD;  Location: [facility];  Service: Gastroenterology;;  . BREAST LUMPECTOMY    . CHOLECYSTECTOMY    . COLONOSCOPY WITH PROPOFOL N/A 10/19/2019   Procedure: COLONOSCOPY WITH PROPOFOL;  Surgeon: [name], MD;  Location: [facility];  Service: Gastroenterology;  Laterality: N/A;  . ESOPHAGOGASTRODUODENOSCOPY (EGD) WITH PROPOFOL N/A 10/19/2019   Procedure: ESOPHAGOGASTRODUODENOSCOPY (EGD) WITH PROPOFOL;  Surgeon: [name], MD;  Location: [facility];  Service: Gastroenterology;  Laterality: N/A;  . POLYPECTOMY  10/19/2019   Procedure: POLYPECTOMY;  Surgeon: [name], MD;  Location: [facility];  Service: Gastroenterology;;   No family history on file. Social History:  reports that she has quit smoking. Her smoking use included cigarettes. She has never used smokeless tobacco. She reports current alcohol use. She reports that she does not use drugs. Allergies:  Allergies  Allergen Reactions  . Sulfa Antibiotics     "feels bad"   Medications Prior to Admission  Medication Sig Dispense Refill  . esomeprazole (NEXIUM) 20 MG capsule Take 20 mg by  mouth daily.    . furosemide (LASIX) 20 MG tablet Take 20 mg by mouth daily as needed for fluid.     Marland Kitchen levothyroxine (SYNTHROID) 112 MCG tablet Take 1 tablet (112 mcg total) by  mouth every morning. 30 tablet 0  . metoprolol succinate (TOPROL-XL) 50 MG 24 hr tablet Take 1 tablet (50 mg total) by mouth daily. Take with or immediately following a meal. 30 tablet 1  . sertraline (ZOLOFT) 50 MG tablet Take 50 mg by mouth daily.    . traZODone (DESYREL) 50 MG tablet Take 50 mg by mouth at bedtime as needed for sleep.       Drug Regimen Review Drug regimen was reviewed and remains appropriate with no significant issues identified  Home: Home Living Family/patient expects to be discharged to:: Private residence Living Arrangements: Alone Available Help at Discharge: Family,Available PRN/intermittently Type of Home: House Home Access: Stairs to enter [employer] of Steps: 1 Entrance Stairs-Rails: None Home Layout: One level Bathroom Shower/Tub: Multimedia programmer: Handicapped height Home Equipment: Environmental consultant - 4 wheels,Cane - single point,Shower seat - built in Additional Comments: daughter lives 2 minutes away from patient.    Functional History: Prior Function Level of Independence: Independent with assistive device(s) Comments: pt stands in shower, has grab bars and a bench but doesn't use it. She is independent with homemaking/ADL's and does not alway use rollator to mobilize. Drives, grocery shops  Functional Status:  Mobility: Bed Mobility Overal bed mobility: Needs Assistance Bed Mobility: Supine to Sit Supine to sit: Min guard,HOB elevated General bed mobility comments: for safety, vc for sequencing task and increased time to scoot to EOB Transfers Overall transfer level: Needs assistance Equipment used: 1 person hand held assist Transfers: Sit to/from Stand Sit to Stand: Min assist,From elevated surface General transfer comment: min assist for rise and steady, increased time to perform. STS x2, from EOB and toilet Ambulation/Gait Ambulation/Gait assistance: Mod assist Gait Distance (Feet): 50 Feet (x2) Assistive device: 1  person hand held assist,Rolling walker (2 wheeled) Gait Pattern/deviations: Step-through pattern,Decreased stride length,Staggering right General Gait Details: mod assist to steady, correct heavy R bias, and correct LOB x1. seated rest break x1 to recover fatigue and tachycardia to 140s bpm. VC for upright posture, placement inside RW. Gait velocity: decr    ADL: ADL Overall ADL's : Needs assistance/impaired Eating/Feeding: Set up,Sitting Grooming: Wash/dry hands,Wash/dry face,Oral care,Brushing hair,Set up,Sitting Upper Body Bathing: Set up,Supervision/ safety,Sitting Lower Body Bathing: Moderate assistance,Sit to/from stand Upper Body Dressing : Minimal assistance,Sitting Lower Body Dressing: Moderate assistance,Sit to/from stand Toilet Transfer: Moderate assistance,+2 for safety/equipment,Ambulation,Comfort height toilet,Grab bars,RW Toileting- Clothing Manipulation and Hygiene: Moderate assistance,Sit to/from stand Functional mobility during ADLs: Moderate assistance,+2 for safety/equipment General ADL Comments: Pt with balance deficits requiring up to mod A to correct  Cognition: Cognition Overall Cognitive Status: Difficult to assess Arousal/Alertness: Awake/alert Orientation Level: Oriented X4 Attention: Sustained,Selective Sustained Attention: Appears intact Selective Attention: Appears intact Memory: Appears intact Safety/Judgment: Appears intact Cognition Arousal/Alertness: Awake/alert Behavior During Therapy: WFL for tasks assessed/performed Overall Cognitive Status: Difficult to assess General Comments: follows all commands appropriately, requires cues for safety intermittently and presents with some R inattention Difficult to assess due to: Impaired communication (expressive difficulties)  Physical Exam: Blood pressure (!) 157/68, pulse 97, temperature 98.3 F (36.8 C), temperature source Oral, resp. rate 17, height 5\' 2"  (1.575 m), weight 76.8 kg, SpO2 98 %. Gen:  no distress, normal appearing HEENT: oral mucosa pink and moist, NCAT Cardio: Reg rate  Pulmonary:     Effort: Pulmonary effort is normal. No respiratory distress.     Breath sounds: Normal breath sounds. No wheezing or rales.  Abdominal:     General: Bowel sounds are normal. There is no distension.     Tenderness: There is no abdominal tenderness.  Musculoskeletal:        General: No swelling or tenderness.     Cervical back: Normal range of motion.  Skin:    General: Skin is warm and dry.     Findings: Bruising present.  Neurological:     Mental Status: She is alert.     Comments: Pt alert and oriented with extra time. Reasonable insight and awarenesWord finding deficits, verbal and motor apraxia. Mild right facial weakness. RUE 4/5. LUE 4+/5. RLE 4+/5. LLE 5/5. Has difficulty synchronizing movements. Sl decreased light touch RLE>RUE. Normal tone.   Psychiatric:        Mood and Affect: Mood normal.        Behavior: Behavior normal.    Results for orders placed or performed during the hospital encounter of 05/30/20 (from the past 48 hour(s))  CBC     Status: Abnormal   Collection Time: 06/01/20  1:12 AM  Result Value Ref Range   WBC 6.9 4.0 - 10.5 K/uL   RBC 4.19 3.87 - 5.11 MIL/uL   Hemoglobin 11.5 (L) 12.0 - 15.0 g/dL   HCT 36.7 36.0 - 46.0 %   MCV 87.6 80.0 - 100.0 fL   MCH 27.4 26.0 - 34.0 pg   MCHC 31.3 30.0 - 36.0 g/dL   RDW 15.9 (H) 11.5 - 15.5 %   Platelets 175 150 - 400 K/uL   nRBC 0.0 0.0 - 0.2 %    Comment: Performed at [facility], [place] [address], [place], [place] [id number]  Basic metabolic panel     Status: Abnormal   Collection Time: 06/01/20  1:12 AM  Result Value Ref Range   Sodium 140 135 - 145 mmol/L   Potassium 3.8 3.5 - 5.1 mmol/L   Chloride 109 98 - 111 mmol/L   CO2 23 22 - 32 mmol/L   Glucose, Bld 115 (H) 70 - 99 mg/dL    Comment: Glucose reference range applies only to samples taken after fasting for at least 8 hours.   BUN 11 8 - 23 mg/dL    Creatinine, Ser 1.02 (H) 0.44 - 1.00 mg/dL   Calcium 8.7 (L) 8.9 - 10.3 mg/dL   GFR, Estimated 53 (L) >60 mL/min    Comment: (NOTE) Calculated using the CKD-EPI Creatinine Equation (2021)    Anion gap 8 5 - 15    Comment: Performed at [facility] [address] [place], [place] [address]  CBC     Status: Abnormal   Collection Time: 06/02/20 12:59 AM  Result Value Ref Range   WBC 6.8 4.0 - 10.5 K/uL   RBC 4.35 3.87 - 5.11 MIL/uL   Hemoglobin 11.8 (L) 12.0 - 15.0 g/dL   HCT 38.4 36.0 - 46.0 %   MCV 88.3 80.0 - 100.0 fL   MCH 27.1 26.0 - 34.0 pg   MCHC 30.7 30.0 - 36.0 g/dL   RDW 15.8 (H) 11.5 - 15.5 %   Platelets 174 150 - 400 K/uL   nRBC 0.0 0.0 - 0.2 %    Comment: Performed at [facility], [place] [address] [place], [place] [address]   DG Swallowing Func-Speech Pathology  Result Date:  06/01/2020 Objective Swallowing Evaluation: Type of Study: MBS-Modified Barium Swallow Study  Patient Details Name: Gal Chmiel MRN: [id number] Date of Birth: [birth year] Today's Date: 06/01/2020 Time: SLP Start Time (ACUTE ONLY): 1003 -SLP Stop Time (ACUTE ONLY): 1017 SLP Time Calculation (min) (ACUTE ONLY): 14 min Past Medical History: Past Medical History: Diagnosis Date . Atrial fibrillation [facility])  Past Surgical History: Past Surgical History: Procedure Laterality Date . ABDOMINAL HYSTERECTOMY   . BIOPSY  10/19/2019  Procedure: BIOPSY;  Surgeon: [name], MD;  [facility];  Service: Gastroenterology;; . BREAST LUMPECTOMY   . CHOLECYSTECTOMY   . COLONOSCOPY WITH PROPOFOL N/A 10/19/2019  Procedure: COLONOSCOPY WITH PROPOFOL;  Surgeon: [name], MD;  [facility];  Service: Gastroenterology;  Laterality: N/A; . ESOPHAGOGASTRODUODENOSCOPY (EGD) WITH PROPOFOL N/A 10/19/2019  Procedure: ESOPHAGOGASTRODUODENOSCOPY (EGD) WITH PROPOFOL;  Surgeon: [name], MD;  [facility];  Service: Gastroenterology;  Laterality: N/A; . POLYPECTOMY   10/19/2019  Procedure: POLYPECTOMY;  Surgeon: [name], MD;  [facility];  Service: Gastroenterology;; HPI: Pt is an 87 y.o. female with a PMHx of permanent atrial fibrillation with RVT not on AC, HTN, HLD not on statin, orthostatic hypotension, symptomatic recurrent anemia, GERD, depression, breast cancer, hypothyroidism,  who presented to the [facility] ED with a chief complaint of inability to communicate effectively and decreased sensation on her right side. Daughter also noted some difficulty walking and right sided weakness. She was out of the time window for tPA at the time of arrival to the ED. MRI brain revealed left posterior MCA territory acute infarctions with occlusion of multiple left M2 and M2/M3 branches.  Chest CT (05/30/20) revealed "Diffuse pulmonary vascular congestion with hazy and streaky opacities in the mid to lower lungs and more patchy, coalescent opacity in the retrocardiac space. Findings may reflect some developing edema and/or atelectasis. Infection less favored though not fully excluded in the appropriate clinical context. Per NP note (05/31/20), "passed nursing bedside swallow but coughing during my visit today with liquids". BSE requested.  No data recorded Assessment / Plan / Recommendation CHL IP CLINICAL IMPRESSIONS 06/01/2020 Clinical Impression Pt presents with normal oropharyngeal swallow. With all solid and liquid pt demonstrated adequate oral manipulation, cohesion and timely swallow trigger with strong pharyngeal peristalsis and laryngeal closure which was evident with absence of penetration/aspiration and normal amounts of residuals in vallecula and pyriform sinuses. It should be noted, however, that pt exhibited initial difficulty obtaining anterior- posterior oral transit of whole pill with thin liquids and eventually lodged in vallecular space briefly before fully propelled into cervical esophagus with help of puree wash. Cough x1 noted after swallowing whole  pill, but no evidence of airway compromise with fluoro turned on. Recommend pt continue on regular, thin liquid diet with meds whole (puree wash as needed). SLP to f/u for tolerance and education. SLP Visit Diagnosis Dysphagia, unspecified (R13.10) Attention and concentration deficit following -- Frontal lobe and executive function deficit following -- Impact on safety and function No limitations   CHL IP TREATMENT RECOMMENDATION 06/01/2020 Treatment Recommendations Therapy as outlined in treatment plan below   Prognosis 06/01/2020 Prognosis for Safe Diet Advancement Good Barriers to Reach Goals Language deficits Barriers/Prognosis Comment -- CHL IP DIET RECOMMENDATION 06/01/2020 SLP Diet Recommendations Regular solids;Thin liquid Liquid Administration via Straw;Cup Medication Administration Whole meds with liquid Compensations Slow rate;Small sips/bites Postural Changes Seated upright at 90 degrees   CHL IP OTHER RECOMMENDATIONS 06/01/2020 Recommended Consults -- Oral Care Recommendations Oral care BID Other Recommendations --  CHL IP FOLLOW UP RECOMMENDATIONS 06/01/2020 Follow up Recommendations Inpatient Rehab   CHL IP FREQUENCY AND DURATION 06/01/2020 Speech Therapy Frequency (ACUTE ONLY) min 2x/week Treatment Duration 2 weeks      CHL IP ORAL PHASE 06/01/2020 Oral Phase WFL Oral - Pudding Teaspoon -- Oral - Pudding Cup -- Oral - Honey Teaspoon -- Oral - Honey Cup -- Oral - Nectar Teaspoon -- Oral - Nectar Cup -- Oral - Nectar Straw -- Oral - Thin Teaspoon -- Oral - Thin Cup -- Oral - Thin Straw -- Oral - Puree -- Oral - Mech Soft -- Oral - Regular -- Oral - Multi-Consistency -- Oral - Pill -- Oral Phase - Comment --  CHL IP PHARYNGEAL PHASE 06/01/2020 Pharyngeal Phase WFL Pharyngeal- Pudding Teaspoon -- Pharyngeal -- Pharyngeal- Pudding Cup -- Pharyngeal -- Pharyngeal- Honey Teaspoon -- Pharyngeal -- Pharyngeal- Honey Cup -- Pharyngeal -- Pharyngeal- Nectar Teaspoon -- Pharyngeal -- Pharyngeal- Nectar Cup --  Pharyngeal -- Pharyngeal- Nectar Straw -- Pharyngeal -- Pharyngeal- Thin Teaspoon -- Pharyngeal -- Pharyngeal- Thin Cup -- Pharyngeal -- Pharyngeal- Thin Straw -- Pharyngeal -- Pharyngeal- Puree -- Pharyngeal -- Pharyngeal- Mechanical Soft -- Pharyngeal -- Pharyngeal- Regular -- Pharyngeal -- Pharyngeal- Multi-consistency -- Pharyngeal -- Pharyngeal- Pill -- Pharyngeal -- Pharyngeal Comment --  CHL IP CERVICAL ESOPHAGEAL PHASE 06/01/2020 Cervical Esophageal Phase WFL Pudding Teaspoon -- Pudding Cup -- Honey Teaspoon -- Honey Cup -- Nectar Teaspoon -- Nectar Cup -- Nectar Straw -- Thin Teaspoon -- Thin Cup -- Thin Straw -- Puree -- Mechanical Soft -- Regular -- Multi-consistency -- Pill -- Cervical Esophageal Comment -- Avie Echevaria, MA, CCC-SLP Acute Rehabilitation Services Office Number: [phone] Paulette Blanch 06/01/2020, 11:39 AM              ECHOCARDIOGRAM COMPLETE  Result Date: 05/31/2020    ECHOCARDIOGRAM REPORT   Patient Name:   JOYIA SALTS Date of Exam: 05/31/2020 Medical Rec #:  [id number]             Height:       62.0 in Accession #:    [id number]            Weight:       169.3 lb Date of Birth:  1930-04-23             BSA:          1.781 m Patient Age:    89 years              BP:           129/77 mmHg Patient Gender: F                     HR:           103 bpm. Exam Location:  Inpatient Procedure: 2D Echo, Cardiac Doppler and Color Doppler Indications:    Stroke I63.9  History:        Patient has prior history of Echocardiogram examinations, most                 recent 10/13/2019. Arrythmias:Atrial Fibrillation.  Sonographer:    Tiffany Dance Referring Phys: [id number] ALEXIS HUGELMEYER IMPRESSIONS  1. Left ventricular ejection fraction, by estimation, is 55 to 60%. The left ventricle has normal function. The left ventricle has no regional wall motion abnormalities. Left ventricular diastolic function could not be evaluated.  2. Right ventricular systolic function is moderately reduced.  The right ventricular size is mildly enlarged. There is  severely elevated pulmonary artery systolic pressure. The estimated right ventricular systolic pressure is 123XX123 mmHg.  3. Left atrial size was severely dilated.  4. Right atrial size was severely dilated.  5. The pericardial effusion is posterior to the left ventricle.  6. The mitral valve is normal in structure. Mild mitral valve regurgitation. No evidence of mitral stenosis.  7. Tricuspid valve regurgitation is mild to moderate.  8. The aortic valve is normal in structure. Aortic valve regurgitation is not visualized. No aortic stenosis is present.  9. The inferior vena cava is dilated in size with >50% respiratory variability, suggesting right atrial pressure of 8 mmHg. FINDINGS  Left Ventricle: Left ventricular ejection fraction, by estimation, is 55 to 60%. The left ventricle has normal function. The left ventricle has no regional wall motion abnormalities. The left ventricular internal cavity size was normal in size. There is  no left ventricular hypertrophy. Left ventricular diastolic function could not be evaluated due to atrial fibrillation. Left ventricular diastolic function could not be evaluated. Right Ventricle: The right ventricular size is mildly enlarged. No increase in right ventricular wall thickness. Right ventricular systolic function is moderately reduced. There is severely elevated pulmonary artery systolic pressure. The tricuspid regurgitant velocity is 3.69 m/s, and with an assumed right atrial pressure of 8 mmHg, the estimated right ventricular systolic pressure is 123XX123 mmHg. Left Atrium: Left atrial size was severely dilated. Right Atrium: Right atrial size was severely dilated. Pericardium: Trivial pericardial effusion is present. The pericardial effusion is posterior to the left ventricle. Mitral Valve: The mitral valve is normal in structure. Mild mitral valve regurgitation. No evidence of mitral valve stenosis. Tricuspid Valve:  The tricuspid valve is normal in structure. Tricuspid valve regurgitation is mild to moderate. No evidence of tricuspid stenosis. Aortic Valve: The aortic valve is normal in structure. Aortic valve regurgitation is not visualized. No aortic stenosis is present. Pulmonic Valve: The pulmonic valve was normal in structure. Pulmonic valve regurgitation is trivial. No evidence of pulmonic stenosis. Aorta: The aortic root is normal in size and structure. Venous: The inferior vena cava is dilated in size with greater than 50% respiratory variability, suggesting right atrial pressure of 8 mmHg. IAS/Shunts: The interatrial septum appears to be lipomatous. No atrial level shunt detected by color flow Doppler.  LEFT VENTRICLE PLAX 2D LVIDd:         4.20 cm LVIDs:         3.30 cm LV PW:         1.10 cm LV IVS:        1.10 cm LVOT diam:     1.80 cm LV SV:         33 LV SV Index:   18 LVOT Area:     2.54 cm  RIGHT VENTRICLE          IVC RV Basal diam:  3.60 cm  IVC diam: 2.60 cm RV Mid diam:    2.60 cm TAPSE (M-mode): 1.3 cm LEFT ATRIUM             Index       RIGHT ATRIUM           Index LA diam:        5.70 cm 3.20 cm/m  RA Area:     30.60 cm LA Vol (A2C):   93.1 ml 52.27 ml/m RA Volume:   110.00 ml 61.76 ml/m LA Vol (A4C):   81.0 ml 45.48 ml/m LA Biplane Vol: 87.7 ml 49.24  ml/m  AORTIC VALVE LVOT Vmax:   69.45 cm/s LVOT Vmean:  45.100 cm/s LVOT VTI:    0.129 m  AORTA Ao Root diam: 3.00 cm Ao Asc diam:  3.00 cm MITRAL VALVE                TRICUSPID VALVE MV Area (PHT): 3.27 cm     TR Peak grad:   54.5 mmHg MV Decel Time: 232 msec     TR Vmax:        369.00 cm/s MV E velocity: 112.00 cm/s MV A velocity: 41.20 cm/s   SHUNTS MV E/A ratio:  2.72         Systemic VTI:  0.13 m                             Systemic Diam: 1.80 cm Armanda Magic MD Electronically signed by Armanda Magic MD Signature Date/Time: 05/31/2020/3:43:30 PM    Final    VAS US CAROTID  Result Date: 05/31/2020 Carotid Arterial Duplex Study Indications: CVA.  Performing Technologist: Blanch Media RVS  Examination Guidelines: A complete evaluation includes B-mode imaging, spectral Doppler, color Doppler, and power Doppler as needed of all accessible portions of each vessel. Bilateral testing is considered an integral part of a complete examination. Limited examinations for reoccurring indications may be performed as noted.  Right Carotid Findings: +----------+--------+--------+--------+------------------+--------+           PSV cm/sEDV cm/sStenosisPlaque DescriptionComments +----------+--------+--------+--------+------------------+--------+ CCA Prox  41      9               heterogenous               +----------+--------+--------+--------+------------------+--------+ CCA Distal41      10              heterogenous               +----------+--------+--------+--------+------------------+--------+ ICA Prox  73      18      1-39%   heterogenous               +----------+--------+--------+--------+------------------+--------+ ICA Distal93      24                                         +----------+--------+--------+--------+------------------+--------+ ECA       90                                                 +----------+--------+--------+--------+------------------+--------+ +----------+--------+-------+--------+-------------------+           PSV cm/sEDV cmsDescribeArm Pressure (mmHG) +----------+--------+-------+--------+-------------------+ [id number]                                         +----------+--------+-------+--------+-------------------+ +---------+--------+--+--------+--+---------+ VertebralPSV cm/s66EDV cm/s10Antegrade +---------+--------+--+--------+--+---------+  Left Carotid Findings: +----------+--------+--------+--------+------------------+--------+           PSV cm/sEDV cm/sStenosisPlaque DescriptionComments +----------+--------+--------+--------+------------------+--------+ CCA Prox   48      7               heterogenous               +----------+--------+--------+--------+------------------+--------+  CCA Distal33      5               heterogenous               +----------+--------+--------+--------+------------------+--------+ ICA Prox  102     22      1-39%   heterogenous               +----------+--------+--------+--------+------------------+--------+ ICA Distal62      19                                         +----------+--------+--------+--------+------------------+--------+ ECA       71      8                                          +----------+--------+--------+--------+------------------+--------+ +----------+--------+--------+--------+-------------------+           PSV cm/sEDV cm/sDescribeArm Pressure (mmHG) +----------+--------+--------+--------+-------------------+ [id number]                                          +----------+--------+--------+--------+-------------------+ +---------+--------+--+--------+--+---------+ VertebralPSV cm/s39EDV cm/s13Antegrade +---------+--------+--+--------+--+---------+   Summary: Right Carotid: Velocities in the right ICA are consistent with a 1-39% stenosis. Left Carotid: Velocities in the left ICA are consistent with a 1-39% stenosis. Vertebrals: Bilateral vertebral arteries demonstrate antegrade flow. *See table(s) above for measurements and observations.  Electronically signed by Jamelle Haring on 05/31/2020 at 4:30:40 PM.    Final        Medical Problem List and Plan: 1.  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  Antithrombotics: -DVT/anticoagulation: Eliquis  -antiplatelet therapy: N/A 3. Pain Management: Tylenol as needed 4. Mood: Zoloft 50 mg daily  -antipsychotic agents: N/A 5. Neuropsych: This patient is capable of making decisions  on her own behalf. 6. Skin/Wound Care: Routine skin checks 7. Fluids/Electrolytes/Nutrition: Routine in and outs with follow-up chemistries 8.  Atrial fibrillation.  Cardiac rate controlled.  Continue Toprol 25 mg daily.  Continue Eliquis  -monitor tolerance for increased physical activity 9.  Hypothyroidism.  Synthroid 10.  Hyperlipidemia.  Lipitor 11. Obesity: BMI 30.97: provide counseling  12. Anemia: Hgb 11.9- monitor weekly 13. AKI: Cr 1.02, repeat tomorrow, encourage hydration  I have personally performed a face to face diagnostic evaluation, including, but not limited to relevant history and physical exam findings, of this patient and developed relevant assessment and plan.  Additionally, I have reviewed and concur with the physician assistant's documentation above.  Leeroy Cha, MD  Lavon Paganini Burley, PA-C 06/02/2020

## 2020-06-02 NOTE — Discharge Summary (Signed)
Physician Discharge Summary  Katrina Randolph WUJ:[id number] DOB: 1930/08/15 DOA: 05/30/2020  PCP: Jarrett Soho, PA-C  Admit date: 05/30/2020 Discharge date: 06/02/2020  Admitted From: Home Disposition: CIR  Recommendations for Outpatient Follow-up:  1. Follow up with PCP in 1-2 weeks 2. Please obtain BMP/CBC in one week 3. Please follow up neurology as scheduled:  Discharge Condition: Stable CODE STATUS: Full Diet recommendation: As tolerated  Brief/Interim Summary: BettyEarnhardtis a 87 y.o.femalewith a known history of atrial fibrillation not on anticoagulation, hypothyroidismpresents to the emergency department for evaluation of altered mental status. Patient was in a usual state of health until 18 hours prior to arrival she was last seen normal. She states that she lost her balance and fell. She subsequently reported right upper extremity and right lower extremity weakness associated with difficulty with word finding, mild confusion. Events were unwitnessed. Patient called her daughter.She lives alone, normally functionally independent, ambulates without assistance. At present patient reports difficulty with word finding, right sided weakness and decreased sensation on the right. Otherwise there has been no change in status. Patient has been taking medication as prescribed and there has been no recent change in medication or diet. No recent antibiotics. There has been no recent illness, hospitalizations, travel or sick contacts.  Patient admitted as above, found to have acute left posterior MCA stroke, neurology was consulted at intake, lengthy discussion with family about need for full dose anticoagulation in the setting of A. fib, there was questionable history of bleeding however on further exam with family this appears to have been a transient event post procedure and she has not had any personal history of profound GI bleeding or hospitalization for bleeding.  Patient  otherwise stable and agreeable for discharge to CIR, close follow-up with cardiology, neurology and PCP in the next few weeks as scheduled.   Discharge Diagnoses:  Active Problems:   CVA (cerebral vascular accident) [facility])    Discharge Instructions  Discharge Instructions    Diet - low sodium heart healthy   Complete by: As directed    Increase activity slowly   Complete by: As directed      Allergies as of 06/02/2020      Reactions   Sulfa Antibiotics    "feels bad"      Medication List    STOP taking these medications   traZODone 50 MG tablet Commonly known as: DESYREL     TAKE these medications   apixaban 5 MG Tabs tablet Commonly known as: ELIQUIS Take 1 tablet (5 mg total) by mouth 2 (two) times daily.   atorvastatin 10 MG tablet Commonly known as: LIPITOR Take 2 tablets (20 mg total) by mouth daily. Start taking on: June 03, 2020   esomeprazole 20 MG capsule Commonly known as: NEXIUM Take 20 mg by mouth daily.   furosemide 20 MG tablet Commonly known as: LASIX Take 20 mg by mouth daily as needed for fluid.   levothyroxine 112 MCG tablet Commonly known as: SYNTHROID Take 1 tablet (112 mcg total) by mouth every morning.   metoprolol succinate 25 MG 24 hr tablet Commonly known as: TOPROL-XL Take 1 tablet (25 mg total) by mouth daily. Take with or immediately following a meal. What changed:   medication strength  how much to take   sertraline 50 MG tablet Commonly known as: ZOLOFT Take 50 mg by mouth daily.       Allergies  Allergen Reactions  . Sulfa Antibiotics     "feels bad"    Consultations:  Neuro, cardiology  Procedures/Studies: DG Chest 2 View  Result Date: 05/31/2020 CLINICAL DATA:  TIA EXAM: CHEST - 2 VIEW COMPARISON:  Radiograph 10/12/2018 FINDINGS: Diffuse pulmonary vascular congestion with hazy and streaky opacities in the mid to lower lungs and more patchy, coalescent opacity in the retrocardiac space. No pneumothorax or  visible effusion. Biapical pleuroparenchymal scarring. Stable cardiomegaly with a calcified, tortuous aorta. The osseous structures appear diffusely demineralized which may limit detection of small or nondisplaced fractures. No acute osseous or soft tissue abnormality. Degenerative changes are present in the imaged spine and shoulders. Telemetry leads overlie the chest. IMPRESSION: Diffuse pulmonary vascular congestion with hazy and streaky opacities in the mid to lower lungs and more patchy, coalescent opacity in the retrocardiac space. Findings may reflect some developing edema and/or atelectasis. Infection less favored though not fully excluded in the appropriate clinical context. Electronically Signed   By: Lovena Le M.D.   On: 05/31/2020 00:11   CT Head Wo Contrast  Result Date: 05/30/2020 CLINICAL DATA:  Altered mental status, last known normal 12 hours ago EXAM: CT HEAD WITHOUT CONTRAST TECHNIQUE: Contiguous axial images were obtained from the base of the skull through the vertex without intravenous contrast. COMPARISON:  None. FINDINGS: Brain: No evidence of acute large vascular territory infarction, hemorrhage, hydrocephalus, extra-axial collection or mass lesion/mass effect. Age related global parenchymal volume loss with ex vacuo dilatation of ventricular system and prominent extra-axial spaces. There are patchy subcortical and periventricular white matter hypodensities which are nonspecific but favored represent sequela of chronic ischemic small vessel white matter disease. Vascular: No hyperdense vessel. Atherosclerotic calcifications of the intracranial portions of the internal carotid and vertebral arteries. Skull: Hyperostosis frontalis. Negative for fracture or focal lesion. Sinuses/Orbits: The paranasal sinuses and mastoid air cells are predominantly clear. Pneumatized petrous apices. Orbits are grossly unremarkable. Other: None IMPRESSION: 1. No acute intracranial findings. 2. Age related  global parenchymal volume loss and chronic ischemic small vessel white matter disease. Electronically Signed   By: Dahlia Bailiff MD   On: 05/30/2020 14:34   MR ANGIO HEAD WO CONTRAST  Result Date: 05/30/2020 CLINICAL DATA:  Neuro deficit, acute stroke suspected. EXAM: MRI HEAD WITHOUT CONTRAST MRA HEAD WITHOUT CONTRAST TECHNIQUE: Multiplanar, multiecho pulse sequences of the brain and surrounding structures were obtained without intravenous contrast. Angiographic images of the head were obtained using MRA technique without contrast. COMPARISON:  None. FINDINGS: MRI HEAD FINDINGS Brain: Multiple acute infarcts in the left MCA territory, including acute infarct of the posterior left insula, left periventricular frontal lobe and left frontoparietal cortex superiorly. There is T2/FLAIR hyperintense mild edema associated with these infarcts. No substantial mass effect. No midline shift. Basal cisterns are patent. No evidence of acute hemorrhage. No extra-axial fluid collections. Additional moderate T2/FLAIR hyperintensities within the white matter and pons, nonspecific but most likely related to chronic microvascular ischemic disease. Frontal predominant cerebral volume loss with associated prominence of the overlying extra-axial spaces. Vascular: Major arterial flow voids are maintained at the skull base. Skull and upper cervical spine: Normal marrow signal. Sinuses/Orbits: Clear sinuses.  Unremarkable orbits. Other: No sizable mastoid effusions. MRA HEAD FINDINGS Anterior circulation: Bilateral ICAs are patent through the carotid terminus. Bilateral M1 MCAs are patent. Left proximal M2 MCA inferior division is poorly visualized, but within an area of artifact. There is occlusion of two other proximal left M2 MCA branches in the left sylvian fissure (see series 24, images 41 and 45). Additionally, there is suspected occlusion of a more distal left M2/M3  MCA branch (series 24, image 30). Smaller left A1 ACA, likely  congenital. Otherwise, patent ACAs. Possible small (approximately 2 mm) outpouching in the region of the right ICA terminus. Posterior circulation: Patent intradural vertebral arteries and basilar artery. Right fetal type PCA. Patent proximal posterior cerebral arteries with suspected mild stenosis of the right P2 PCA. IMPRESSION: 1. Left posterior MCA territory acute infarcts with occlusion of multiple left M2 and M2/M3 MCA branches, as described above. Associated edema without substantial mass effect. 2. Moderate chronic microvascular ischemic disease. 3. Frontal predominant cerebral volume loss. 4. Possible small (approximately 2 mm) aneurysm in the region of the right ICA terminus. A CTA could further characterize if clinically indicated Critical/urgent findings discussed with Dr. Rory Percy at 6:14 p.m via telephone. Electronically Signed   By: Margaretha Sheffield MD   On: 05/30/2020 18:43   MR BRAIN WO CONTRAST  Result Date: 05/30/2020 CLINICAL DATA:  Neuro deficit, acute stroke suspected. EXAM: MRI HEAD WITHOUT CONTRAST MRA HEAD WITHOUT CONTRAST TECHNIQUE: Multiplanar, multiecho pulse sequences of the brain and surrounding structures were obtained without intravenous contrast. Angiographic images of the head were obtained using MRA technique without contrast. COMPARISON:  None. FINDINGS: MRI HEAD FINDINGS Brain: Multiple acute infarcts in the left MCA territory, including acute infarct of the posterior left insula, left periventricular frontal lobe and left frontoparietal cortex superiorly. There is T2/FLAIR hyperintense mild edema associated with these infarcts. No substantial mass effect. No midline shift. Basal cisterns are patent. No evidence of acute hemorrhage. No extra-axial fluid collections. Additional moderate T2/FLAIR hyperintensities within the white matter and pons, nonspecific but most likely related to chronic microvascular ischemic disease. Frontal predominant cerebral volume loss with associated  prominence of the overlying extra-axial spaces. Vascular: Major arterial flow voids are maintained at the skull base. Skull and upper cervical spine: Normal marrow signal. Sinuses/Orbits: Clear sinuses.  Unremarkable orbits. Other: No sizable mastoid effusions. MRA HEAD FINDINGS Anterior circulation: Bilateral ICAs are patent through the carotid terminus. Bilateral M1 MCAs are patent. Left proximal M2 MCA inferior division is poorly visualized, but within an area of artifact. There is occlusion of two other proximal left M2 MCA branches in the left sylvian fissure (see series 24, images 41 and 45). Additionally, there is suspected occlusion of a more distal left M2/M3 MCA branch (series 24, image 30). Smaller left A1 ACA, likely congenital. Otherwise, patent ACAs. Possible small (approximately 2 mm) outpouching in the region of the right ICA terminus. Posterior circulation: Patent intradural vertebral arteries and basilar artery. Right fetal type PCA. Patent proximal posterior cerebral arteries with suspected mild stenosis of the right P2 PCA. IMPRESSION: 1. Left posterior MCA territory acute infarcts with occlusion of multiple left M2 and M2/M3 MCA branches, as described above. Associated edema without substantial mass effect. 2. Moderate chronic microvascular ischemic disease. 3. Frontal predominant cerebral volume loss. 4. Possible small (approximately 2 mm) aneurysm in the region of the right ICA terminus. A CTA could further characterize if clinically indicated Critical/urgent findings discussed with Dr. Rory Percy at 6:14 p.m via telephone. Electronically Signed   By: Margaretha Sheffield MD   On: 05/30/2020 18:43   DG Swallowing Func-Speech Pathology  Result Date: 06/01/2020 Objective Swallowing Evaluation: Type of Study: MBS-Modified Barium Swallow Study  Patient Details Name: Mckayla Luetkemeyer MRN: [id number] Date of Birth: 03-11-1930 Today's Date: 06/01/2020 Time: SLP Start Time (ACUTE ONLY): 1003 -SLP Stop  Time (ACUTE ONLY): 1017 SLP Time Calculation (min) (ACUTE ONLY): 14 min Past Medical History: Past Medical History:  Diagnosis Date . Atrial fibrillation [facility])  Past Surgical History: Past Surgical History: Procedure Laterality Date . ABDOMINAL HYSTERECTOMY   . BIOPSY  10/19/2019  Procedure: BIOPSY;  Surgeon: [name], MD;  Location: [facility];  Service: Gastroenterology;; . BREAST LUMPECTOMY   . CHOLECYSTECTOMY   . COLONOSCOPY WITH PROPOFOL N/A 10/19/2019  Procedure: COLONOSCOPY WITH PROPOFOL;  Surgeon: [name], MD;  Location: [facility];  Service: Gastroenterology;  Laterality: N/A; . ESOPHAGOGASTRODUODENOSCOPY (EGD) WITH PROPOFOL N/A 10/19/2019  Procedure: ESOPHAGOGASTRODUODENOSCOPY (EGD) WITH PROPOFOL;  Surgeon: [name], MD;  Location: [facility];  Service: Gastroenterology;  Laterality: N/A; . POLYPECTOMY  10/19/2019  Procedure: POLYPECTOMY;  Surgeon: [name], MD;  Location: [facility];  Service: Gastroenterology;; HPI: Pt is an 87 y.o. female with a PMHx of permanent atrial fibrillation with RVT not on AC, HTN, HLD not on statin, orthostatic hypotension, symptomatic recurrent anemia, GERD, depression, breast cancer, hypothyroidism,  who presented to the [facility] ED with a chief complaint of inability to communicate effectively and decreased sensation on her right side. Daughter also noted some difficulty walking and right sided weakness. She was out of the time window for tPA at the time of arrival to the ED. MRI brain revealed left posterior MCA territory acute infarctions with occlusion of multiple left M2 and M2/M3 branches.  Chest CT (05/30/20) revealed "Diffuse pulmonary vascular congestion with hazy and streaky opacities in the mid to lower lungs and more patchy, coalescent opacity in the retrocardiac space. Findings may reflect some developing edema and/or atelectasis. Infection less favored though not fully excluded in the appropriate clinical context. Per NP note  (05/31/20), "passed nursing bedside swallow but coughing during my visit today with liquids". BSE requested.  No data recorded Assessment / Plan / Recommendation CHL IP CLINICAL IMPRESSIONS 06/01/2020 Clinical Impression Pt presents with normal oropharyngeal swallow. With all solid and liquid pt demonstrated adequate oral manipulation, cohesion and timely swallow trigger with strong pharyngeal peristalsis and laryngeal closure which was evident with absence of penetration/aspiration and normal amounts of residuals in vallecula and pyriform sinuses. It should be noted, however, that pt exhibited initial difficulty obtaining anterior- posterior oral transit of whole pill with thin liquids and eventually lodged in vallecular space briefly before fully propelled into cervical esophagus with help of puree wash. Cough x1 noted after swallowing whole pill, but no evidence of airway compromise with fluoro turned on. Recommend pt continue on regular, thin liquid diet with meds whole (puree wash as needed). SLP to f/u for tolerance and education. SLP Visit Diagnosis Dysphagia, unspecified (R13.10) Attention and concentration deficit following -- Frontal lobe and executive function deficit following -- Impact on safety and function No limitations   CHL IP TREATMENT RECOMMENDATION 06/01/2020 Treatment Recommendations Therapy as outlined in treatment plan below   Prognosis 06/01/2020 Prognosis for Safe Diet Advancement Good Barriers to Reach Goals Language deficits Barriers/Prognosis Comment -- CHL IP DIET RECOMMENDATION 06/01/2020 SLP Diet Recommendations Regular solids;Thin liquid Liquid Administration via Straw;Cup Medication Administration Whole meds with liquid Compensations Slow rate;Small sips/bites Postural Changes Seated upright at 90 degrees   CHL IP OTHER RECOMMENDATIONS 06/01/2020 Recommended Consults -- Oral Care Recommendations Oral care BID Other Recommendations --   CHL IP FOLLOW UP RECOMMENDATIONS 06/01/2020 Follow up  Recommendations Inpatient Rehab   CHL IP FREQUENCY AND DURATION 06/01/2020 Speech Therapy Frequency (ACUTE ONLY) min 2x/week Treatment Duration 2 weeks      CHL IP ORAL PHASE 06/01/2020 Oral Phase WFL Oral - Pudding Teaspoon -- Oral - Pudding Cup --  Oral - Honey Teaspoon -- Oral - Honey Cup -- Oral - Nectar Teaspoon -- Oral - Nectar Cup -- Oral - Nectar Straw -- Oral - Thin Teaspoon -- Oral - Thin Cup -- Oral - Thin Straw -- Oral - Puree -- Oral - Mech Soft -- Oral - Regular -- Oral - Multi-Consistency -- Oral - Pill -- Oral Phase - Comment --  CHL IP PHARYNGEAL PHASE 06/01/2020 Pharyngeal Phase WFL Pharyngeal- Pudding Teaspoon -- Pharyngeal -- Pharyngeal- Pudding Cup -- Pharyngeal -- Pharyngeal- Honey Teaspoon -- Pharyngeal -- Pharyngeal- Honey Cup -- Pharyngeal -- Pharyngeal- Nectar Teaspoon -- Pharyngeal -- Pharyngeal- Nectar Cup -- Pharyngeal -- Pharyngeal- Nectar Straw -- Pharyngeal -- Pharyngeal- Thin Teaspoon -- Pharyngeal -- Pharyngeal- Thin Cup -- Pharyngeal -- Pharyngeal- Thin Straw -- Pharyngeal -- Pharyngeal- Puree -- Pharyngeal -- Pharyngeal- Mechanical Soft -- Pharyngeal -- Pharyngeal- Regular -- Pharyngeal -- Pharyngeal- Multi-consistency -- Pharyngeal -- Pharyngeal- Pill -- Pharyngeal -- Pharyngeal Comment --  CHL IP CERVICAL ESOPHAGEAL PHASE 06/01/2020 Cervical Esophageal Phase WFL Pudding Teaspoon -- Pudding Cup -- Honey Teaspoon -- Honey Cup -- Nectar Teaspoon -- Nectar Cup -- Nectar Straw -- Thin Teaspoon -- Thin Cup -- Thin Straw -- Puree -- Mechanical Soft -- Regular -- Multi-consistency -- Pill -- Cervical Esophageal Comment -- Ellwood Dense, MA, CCC-SLP Acute Rehabilitation Services Office Number: [phone] Acie Fredrickson 06/01/2020, 11:39 AM              ECHOCARDIOGRAM COMPLETE  Result Date: 05/31/2020    ECHOCARDIOGRAM REPORT   Patient Name:   CAYCI WENGLER Date of Exam: 05/31/2020 Medical Rec #:  [id number]             Height:       62.0 in Accession #:    [id number]             Weight:       169.3 lb Date of Birth:  1930-02-15             BSA:          1.781 m Patient Age:    17 years              BP:           129/77 mmHg Patient Gender: F                     HR:           103 bpm. Exam Location:  Inpatient Procedure: 2D Echo, Cardiac Doppler and Color Doppler Indications:    Stroke I63.9  History:        Patient has prior history of Echocardiogram examinations, most                 recent 10/13/2019. Arrythmias:Atrial Fibrillation.  Sonographer:    Tiffany Dance Referring Phys: [id number] [place]  1. Left ventricular ejection fraction, by estimation, is 55 to 60%. The left ventricle has normal function. The left ventricle has no regional wall motion abnormalities. Left ventricular diastolic function could not be evaluated.  2. Right ventricular systolic function is moderately reduced. The right ventricular size is mildly enlarged. There is severely elevated pulmonary artery systolic pressure. The estimated right ventricular systolic pressure is 123XX123 mmHg.  3. Left atrial size was severely dilated.  4. Right atrial size was severely dilated.  5. The pericardial effusion is posterior to the left ventricle.  6. The mitral valve is normal in structure. Mild mitral valve  regurgitation. No evidence of mitral stenosis.  7. Tricuspid valve regurgitation is mild to moderate.  8. The aortic valve is normal in structure. Aortic valve regurgitation is not visualized. No aortic stenosis is present.  9. The inferior vena cava is dilated in size with >50% respiratory variability, suggesting right atrial pressure of 8 mmHg. FINDINGS  Left Ventricle: Left ventricular ejection fraction, by estimation, is 55 to 60%. The left ventricle has normal function. The left ventricle has no regional wall motion abnormalities. The left ventricular internal cavity size was normal in size. There is  no left ventricular hypertrophy. Left ventricular diastolic function could not be evaluated due to  atrial fibrillation. Left ventricular diastolic function could not be evaluated. Right Ventricle: The right ventricular size is mildly enlarged. No increase in right ventricular wall thickness. Right ventricular systolic function is moderately reduced. There is severely elevated pulmonary artery systolic pressure. The tricuspid regurgitant velocity is 3.69 m/s, and with an assumed right atrial pressure of 8 mmHg, the estimated right ventricular systolic pressure is 123XX123 mmHg. Left Atrium: Left atrial size was severely dilated. Right Atrium: Right atrial size was severely dilated. Pericardium: Trivial pericardial effusion is present. The pericardial effusion is posterior to the left ventricle. Mitral Valve: The mitral valve is normal in structure. Mild mitral valve regurgitation. No evidence of mitral valve stenosis. Tricuspid Valve: The tricuspid valve is normal in structure. Tricuspid valve regurgitation is mild to moderate. No evidence of tricuspid stenosis. Aortic Valve: The aortic valve is normal in structure. Aortic valve regurgitation is not visualized. No aortic stenosis is present. Pulmonic Valve: The pulmonic valve was normal in structure. Pulmonic valve regurgitation is trivial. No evidence of pulmonic stenosis. Aorta: The aortic root is normal in size and structure. Venous: The inferior vena cava is dilated in size with greater than 50% respiratory variability, suggesting right atrial pressure of 8 mmHg. IAS/Shunts: The interatrial septum appears to be lipomatous. No atrial level shunt detected by color flow Doppler.  LEFT VENTRICLE PLAX 2D LVIDd:         4.20 cm LVIDs:         3.30 cm LV PW:         1.10 cm LV IVS:        1.10 cm LVOT diam:     1.80 cm LV SV:         33 LV SV Index:   18 LVOT Area:     2.54 cm  RIGHT VENTRICLE          IVC RV Basal diam:  3.60 cm  IVC diam: 2.60 cm RV Mid diam:    2.60 cm TAPSE (M-mode): 1.3 cm LEFT ATRIUM             Index       RIGHT ATRIUM           Index LA diam:         5.70 cm 3.20 cm/m  RA Area:     30.60 cm LA Vol (A2C):   93.1 ml 52.27 ml/m RA Volume:   110.00 ml 61.76 ml/m LA Vol (A4C):   81.0 ml 45.48 ml/m LA Biplane Vol: 87.7 ml 49.24 ml/m  AORTIC VALVE LVOT Vmax:   69.45 cm/s LVOT Vmean:  45.100 cm/s LVOT VTI:    0.129 m  AORTA Ao Root diam: 3.00 cm Ao Asc diam:  3.00 cm MITRAL VALVE                TRICUSPID  VALVE MV Area (PHT): 3.27 cm     TR Peak grad:   54.5 mmHg MV Decel Time: 232 msec     TR Vmax:        369.00 cm/s MV E velocity: 112.00 cm/s MV A velocity: 41.20 cm/s   SHUNTS MV E/A ratio:  2.72         Systemic VTI:  0.13 m                             Systemic Diam: 1.80 cm Fransico Him MD Electronically signed by Fransico Him MD Signature Date/Time: 05/31/2020/3:43:30 PM    Final    VAS US CAROTID  Result Date: 05/31/2020 Carotid Arterial Duplex Study Indications: CVA. Performing Technologist: Abram Sander RVS  Examination Guidelines: A complete evaluation includes B-mode imaging, spectral Doppler, color Doppler, and power Doppler as needed of all accessible portions of each vessel. Bilateral testing is considered an integral part of a complete examination. Limited examinations for reoccurring indications may be performed as noted.  Right Carotid Findings: +----------+--------+--------+--------+------------------+--------+           PSV cm/sEDV cm/sStenosisPlaque DescriptionComments +----------+--------+--------+--------+------------------+--------+ CCA Prox  41      9               heterogenous               +----------+--------+--------+--------+------------------+--------+ CCA Distal41      10              heterogenous               +----------+--------+--------+--------+------------------+--------+ ICA Prox  73      18      1-39%   heterogenous               +----------+--------+--------+--------+------------------+--------+ ICA Distal93      24                                          +----------+--------+--------+--------+------------------+--------+ ECA       90                                                 +----------+--------+--------+--------+------------------+--------+ +----------+--------+-------+--------+-------------------+           PSV cm/sEDV cmsDescribeArm Pressure (mmHG) +----------+--------+-------+--------+-------------------+ [id number]                                         +----------+--------+-------+--------+-------------------+ +---------+--------+--+--------+--+---------+ VertebralPSV cm/s66EDV cm/s10Antegrade +---------+--------+--+--------+--+---------+  Left Carotid Findings: +----------+--------+--------+--------+------------------+--------+           PSV cm/sEDV cm/sStenosisPlaque DescriptionComments +----------+--------+--------+--------+------------------+--------+ CCA Prox  48      7               heterogenous               +----------+--------+--------+--------+------------------+--------+ CCA Distal33      5               heterogenous               +----------+--------+--------+--------+------------------+--------+ ICA Prox  102     22      1-39%  heterogenous               +----------+--------+--------+--------+------------------+--------+ ICA Distal62      19                                         +----------+--------+--------+--------+------------------+--------+ ECA       71      8                                          +----------+--------+--------+--------+------------------+--------+ +----------+--------+--------+--------+-------------------+           PSV cm/sEDV cm/sDescribeArm Pressure (mmHG) +----------+--------+--------+--------+-------------------+ [id number]                                          +----------+--------+--------+--------+-------------------+ +---------+--------+--+--------+--+---------+ VertebralPSV cm/s39EDV cm/s13Antegrade  +---------+--------+--+--------+--+---------+   Summary: Right Carotid: Velocities in the right ICA are consistent with a 1-39% stenosis. Left Carotid: Velocities in the left ICA are consistent with a 1-39% stenosis. Vertebrals: Bilateral vertebral arteries demonstrate antegrade flow. *See table(s) above for measurements and observations.  Electronically signed by Jamelle Haring on 05/31/2020 at 4:30:40 PM.    Final      Subjective: No acute issues or events overnight   Discharge Exam: Vitals:   06/02/20 0740 06/02/20 0743  BP: (!) 160/76 (!) 123/106  Pulse: 81 87  Resp: 20 20  Temp:  98.6 F (37 C)  SpO2: 99% 96%   Vitals:   06/01/20 0905 06/01/20 1955 06/02/20 0740 06/02/20 0743  BP: (!) 119/59 (!) 157/68 (!) 160/76 (!) 123/106  Pulse: 97 97 81 87  Resp: 20 17 20 20   Temp:  98.3 F (36.8 C)  98.6 F (37 C)  TempSrc:  Oral  Oral  SpO2: 96% 98% 99% 96%  Weight:      Height:        General: Pt is alert, awake, not in acute distress Cardiovascular: RRR, S1/S2 +, no rubs, no gallops Respiratory: CTA bilaterally, no wheezing, no rhonchi Abdominal: Soft, NT, ND, bowel sounds + Extremities: no edema, no cyanosis    The results of significant diagnostics from this hospitalization (including imaging, microbiology, ancillary and laboratory) are listed below for reference.     Microbiology: Recent Results (from the past 240 hour(s))  Resp Panel by RT-PCR (Flu A&B, Covid) Nasopharyngeal Swab     Status: None   Collection Time: 05/30/20  3:29 PM   Specimen: Nasopharyngeal Swab; Nasopharyngeal(NP) swabs in vial transport medium  Result Value Ref Range Status   SARS Coronavirus 2 by RT PCR NEGATIVE NEGATIVE Final    Comment: (NOTE) SARS-CoV-2 target nucleic acids are NOT DETECTED.  The SARS-CoV-2 RNA is generally detectable in upper respiratory specimens during the acute phase of infection. The lowest concentration of SARS-CoV-2 viral copies this assay can detect is 138  copies/mL. A negative result does not preclude SARS-Cov-2 infection and should not be used as the sole basis for treatment or other patient management decisions. A negative result may occur with  improper specimen collection/handling, submission of specimen other than nasopharyngeal swab, presence of viral mutation(s) within the areas targeted by this assay, and inadequate number of viral copies(<138 copies/mL). A negative result must be combined with clinical  observations, patient history, and epidemiological information. The expected result is Negative.  Fact Sheet for Patients:  EntrepreneurPulse.com.au  Fact Sheet for Healthcare Providers:  IncredibleEmployment.be  This test is no t yet approved or cleared by the [place] FDA and  has been authorized for detection and/or diagnosis of SARS-CoV-2 by FDA under an Emergency Use Authorization (EUA). This EUA will remain  in effect (meaning this test can be used) for the duration of the COVID-19 declaration under Section 564(b)(1) of the Act, 21 U.S.C.section 360bbb-3(b)(1), unless the authorization is terminated  or revoked sooner.       Influenza A by PCR NEGATIVE NEGATIVE Final   Influenza B by PCR NEGATIVE NEGATIVE Final    Comment: (NOTE) The Xpert Xpress SARS-CoV-2/FLU/RSV plus assay is intended as an aid in the diagnosis of influenza from Nasopharyngeal swab specimens and should not be used as a sole basis for treatment. Nasal washings and aspirates are unacceptable for Xpert Xpress SARS-CoV-2/FLU/RSV testing.  Fact Sheet for Patients: EntrepreneurPulse.com.au  Fact Sheet for Healthcare Providers: IncredibleEmployment.be  This test is not yet approved or cleared by the [place] FDA and has been authorized for detection and/or diagnosis of SARS-CoV-2 by FDA under an Emergency Use Authorization (EUA). This EUA will remain in effect (meaning  this test can be used) for the duration of the COVID-19 declaration under Section 564(b)(1) of the Act, 21 U.S.C. section 360bbb-3(b)(1), unless the authorization is terminated or revoked.  Performed at [facility], [place] [address], [place], [place] [address]      Labs: BNP (last 3 results) Recent Labs    10/12/19 1427  BNP 643.3*   Basic Metabolic Panel: Recent Labs  Lab 05/30/20 1338 05/31/20 0022 06/01/20 0112  NA 139  --  140  K 3.4*  --  3.8  CL 106  --  109  CO2 23  --  23  GLUCOSE 118*  --  115*  BUN 16  --  11  CREATININE 0.91 0.82 1.02*  CALCIUM 8.8*  --  8.7*   Liver Function Tests: Recent Labs  Lab 05/30/20 1338  AST 16  ALT 13  ALKPHOS 60  BILITOT 1.7*  PROT 7.3  ALBUMIN 4.0   No results for input(s): LIPASE, AMYLASE in the last 168 hours. No results for input(s): AMMONIA in the last 168 hours. CBC: Recent Labs  Lab 05/30/20 1338 05/31/20 0022 06/01/20 0112 06/02/20 0059  WBC 8.1 8.6 6.9 6.8  NEUTROABS 6.6  --   --   --   HGB 12.3 12.2 11.5* 11.8*  HCT 40.7 39.5 36.7 38.4  MCV 90.0 88.4 87.6 88.3  PLT 196 137* 175 174   Cardiac Enzymes: No results for input(s): CKTOTAL, CKMB, CKMBINDEX, TROPONINI in the last 168 hours. BNP: Invalid input(s): POCBNP CBG: No results for input(s): GLUCAP in the last 168 hours. D-Dimer No results for input(s): DDIMER in the last 72 hours. Hgb A1c Recent Labs    05/31/20 0022  HGBA1C 6.2*   Lipid Profile Recent Labs    05/31/20 0022  CHOL 171  HDL 54  LDLCALC 103*  TRIG 69  CHOLHDL 3.2   Thyroid function studies No results for input(s): TSH, T4TOTAL, T3FREE, THYROIDAB in the last 72 hours.  Invalid input(s): FREET3 Anemia work up No results for input(s): VITAMINB12, FOLATE, FERRITIN, TIBC, IRON, RETICCTPCT in the last 72 hours. Urinalysis    Component Value Date/Time   COLORURINE AMBER (A) 05/30/2020 1332   APPEARANCEUR CLEAR 05/30/2020 1332  LABSPEC 1.023  05/30/2020 1332   PHURINE 5.0 05/30/2020 1332   GLUCOSEU NEGATIVE 05/30/2020 1332   HGBUR SMALL (A) 05/30/2020 1332   BILIRUBINUR NEGATIVE 05/30/2020 1332   KETONESUR 5 (A) 05/30/2020 1332   PROTEINUR 30 (A) 05/30/2020 1332   NITRITE NEGATIVE 05/30/2020 1332   LEUKOCYTESUR NEGATIVE 05/30/2020 1332   Sepsis Labs Invalid input(s): PROCALCITONIN,  WBC,  LACTICIDVEN Microbiology Recent Results (from the past 240 hour(s))  Resp Panel by RT-PCR (Flu A&B, Covid) Nasopharyngeal Swab     Status: None   Collection Time: 05/30/20  3:29 PM   Specimen: Nasopharyngeal Swab; Nasopharyngeal(NP) swabs in vial transport medium  Result Value Ref Range Status   SARS Coronavirus 2 by RT PCR NEGATIVE NEGATIVE Final    Comment: (NOTE) SARS-CoV-2 target nucleic acids are NOT DETECTED.  The SARS-CoV-2 RNA is generally detectable in upper respiratory specimens during the acute phase of infection. The lowest concentration of SARS-CoV-2 viral copies this assay can detect is 138 copies/mL. A negative result does not preclude SARS-Cov-2 infection and should not be used as the sole basis for treatment or other patient management decisions. A negative result may occur with  improper specimen collection/handling, submission of specimen other than nasopharyngeal swab, presence of viral mutation(s) within the areas targeted by this assay, and inadequate number of viral copies(<138 copies/mL). A negative result must be combined with clinical observations, patient history, and epidemiological information. The expected result is Negative.  Fact Sheet for Patients:  EntrepreneurPulse.com.au  Fact Sheet for Healthcare Providers:  IncredibleEmployment.be  This test is no t yet approved or cleared by the [place] FDA and  has been authorized for detection and/or diagnosis of SARS-CoV-2 by FDA under an Emergency Use Authorization (EUA). This EUA will remain  in effect  (meaning this test can be used) for the duration of the COVID-19 declaration under Section 564(b)(1) of the Act, 21 U.S.C.section 360bbb-3(b)(1), unless the authorization is terminated  or revoked sooner.       Influenza A by PCR NEGATIVE NEGATIVE Final   Influenza B by PCR NEGATIVE NEGATIVE Final    Comment: (NOTE) The Xpert Xpress SARS-CoV-2/FLU/RSV plus assay is intended as an aid in the diagnosis of influenza from Nasopharyngeal swab specimens and should not be used as a sole basis for treatment. Nasal washings and aspirates are unacceptable for Xpert Xpress SARS-CoV-2/FLU/RSV testing.  Fact Sheet for Patients: EntrepreneurPulse.com.au  Fact Sheet for Healthcare Providers: IncredibleEmployment.be  This test is not yet approved or cleared by the [place] FDA and has been authorized for detection and/or diagnosis of SARS-CoV-2 by FDA under an Emergency Use Authorization (EUA). This EUA will remain in effect (meaning this test can be used) for the duration of the COVID-19 declaration under Section 564(b)(1) of the Act, 21 U.S.C. section 360bbb-3(b)(1), unless the authorization is terminated or revoked.  Performed at [facility], [place] [address], [place], [place] [address]      Time coordinating discharge: Over 30 minutes  SIGNED:   Little Ishikawa, DO [facility] 06/02/2020, 12:37 PM Pager   If 7PM-7AM, please contact night-coverage [email or web address]

## 2020-06-02 NOTE — Care Management Important Message (Signed)
Important Message  Patient Details  Name: Katrina Randolph MRN: [id number] Date of Birth: May 09, 1930   Medicare Important Message Given:  Yes     Orbie Pyo 06/02/2020, 2:08 PM

## 2020-06-03 LAB — CBC
HCT: 40.8 % (ref 36.0–46.0)
Hemoglobin: 12.4 g/dL (ref 12.0–15.0)
MCH: 26.9 pg (ref 26.0–34.0)
MCHC: 30.4 g/dL (ref 30.0–36.0)
MCV: 88.5 fL (ref 80.0–100.0)
Platelets: 204 10*3/uL (ref 150–400)
RBC: 4.61 MIL/uL (ref 3.87–5.11)
RDW: 16 % — ABNORMAL HIGH (ref 11.5–15.5)
WBC: 8.9 10*3/uL (ref 4.0–10.5)
nRBC: 0 % (ref 0.0–0.2)

## 2020-06-03 NOTE — Plan of Care (Signed)
Problem: Education: Goal: Knowledge of General Education information will improve Description: Including pain rating scale, medication(s)/side effects and non-pharmacologic comfort measures Outcome: Progressing   Problem: Health Behavior/Discharge Planning: Goal: Ability to manage health-related needs will improve Outcome: Progressing   Problem: Clinical Measurements: Goal: Ability to maintain clinical measurements within normal limits will improve Outcome: Progressing Goal: Will remain free from infection Outcome: Progressing Goal: Diagnostic test results will improve Outcome: Progressing Goal: Respiratory complications will improve Outcome: Progressing Goal: Cardiovascular complication will be avoided Outcome: Progressing   Problem: Activity: Goal: Risk for activity intolerance will decrease Outcome: Progressing   Problem: Nutrition: Goal: Adequate nutrition will be maintained Outcome: Progressing   Problem: Coping: Goal: Level of anxiety will decrease Outcome: Progressing   Problem: Elimination: Goal: Will not experience complications related to bowel motility Outcome: Progressing Goal: Will not experience complications related to urinary retention Outcome: Progressing   Problem: Pain Managment: Goal: General experience of comfort will improve Outcome: Progressing   Problem: Safety: Goal: Ability to remain free from injury will improve Outcome: Progressing   Problem: Skin Integrity: Goal: Risk for impaired skin integrity will decrease Outcome: Progressing   Problem: Education: Goal: Knowledge of secondary prevention will improve Outcome: Progressing Goal: Knowledge of patient specific risk factors addressed and post discharge goals established will improve Outcome: Progressing   Problem: Coping: Goal: Will identify appropriate support needs Outcome: Progressing   Problem: Self-Care: Goal: Verbalization of feelings and concerns over difficulty with  self-care will improve Outcome: Progressing   Problem: Nutrition: Goal: Dietary intake will improve Outcome: Progressing   Problem: Education: Goal: Knowledge of General Education information will improve Description: Including pain rating scale, medication(s)/side effects and non-pharmacologic comfort measures 06/03/2020 2309 by Rico Junker, RN Outcome: Progressing 06/03/2020 2308 by Rico Junker, RN Outcome: Progressing   Problem: Health Behavior/Discharge Planning: Goal: Ability to manage health-related needs will improve 06/03/2020 2309 by Rico Junker, RN Outcome: Progressing 06/03/2020 2308 by Rico Junker, RN Outcome: Progressing   Problem: Clinical Measurements: Goal: Ability to maintain clinical measurements within normal limits will improve 06/03/2020 2309 by Rico Junker, RN Outcome: Progressing 06/03/2020 2308 by Rico Junker, RN Outcome: Progressing Goal: Will remain free from infection 06/03/2020 2309 by Rico Junker, RN Outcome: Progressing 06/03/2020 2308 by Rico Junker, RN Outcome: Progressing Goal: Diagnostic test results will improve 06/03/2020 2309 by Rico Junker, RN Outcome: Progressing 06/03/2020 2308 by Rico Junker, RN Outcome: Progressing Goal: Respiratory complications will improve 06/03/2020 2309 by Rico Junker, RN Outcome: Progressing 06/03/2020 2308 by Rico Junker, RN Outcome: Progressing Goal: Cardiovascular complication will be avoided 06/03/2020 2309 by Rico Junker, RN Outcome: Progressing 06/03/2020 2308 by Rico Junker, RN Outcome: Progressing   Problem: Activity: Goal: Risk for activity intolerance will decrease 06/03/2020 2309 by Rico Junker, RN Outcome: Progressing 06/03/2020 2308 by Rico Junker, RN Outcome: Progressing   Problem: Nutrition: Goal: Adequate nutrition will be maintained 06/03/2020 2309 by Rico Junker, RN Outcome: Progressing 06/03/2020 2308 by Rico Junker, RN Outcome: Progressing   Problem:  Coping: Goal: Level of anxiety will decrease 06/03/2020 2309 by Rico Junker, RN Outcome: Progressing 06/03/2020 2308 by Rico Junker, RN Outcome: Progressing   Problem: Elimination: Goal: Will not experience complications related to bowel motility 06/03/2020 2309 by Rico Junker, RN Outcome: Progressing 06/03/2020 2308 by Rico Junker, RN Outcome: Progressing Goal: Will not experience complications related to urinary retention 06/03/2020 2309 by Domenic Schwab  T, RN Outcome: Progressing 06/03/2020 2308 by Rico Junker, RN Outcome: Progressing   Problem: Pain Managment: Goal: General experience of comfort will improve 06/03/2020 2309 by Rico Junker, RN Outcome: Progressing 06/03/2020 2308 by Rico Junker, RN Outcome: Progressing   Problem: Safety: Goal: Ability to remain free from injury will improve 06/03/2020 2309 by Rico Junker, RN Outcome: Progressing 06/03/2020 2308 by Rico Junker, RN Outcome: Progressing   Problem: Skin Integrity: Goal: Risk for impaired skin integrity will decrease 06/03/2020 2309 by Rico Junker, RN Outcome: Progressing 06/03/2020 2308 by Rico Junker, RN Outcome: Progressing   Problem: Education: Goal: Knowledge of secondary prevention will improve 06/03/2020 2309 by Rico Junker, RN Outcome: Progressing 06/03/2020 2308 by Rico Junker, RN Outcome: Progressing Goal: Knowledge of patient specific risk factors addressed and post discharge goals established will improve 06/03/2020 2309 by Rico Junker, RN Outcome: Progressing 06/03/2020 2308 by Rico Junker, RN Outcome: Progressing Goal: Individualized Educational Video(s) Outcome: Progressing   Problem: Coping: Goal: Will identify appropriate support needs 06/03/2020 2309 by Rico Junker, RN Outcome: Progressing 06/03/2020 2308 by Rico Junker, RN Outcome: Progressing   Problem: Self-Care: Goal: Verbalization of feelings and concerns over difficulty with self-care will  improve 06/03/2020 2309 by Rico Junker, RN Outcome: Progressing 06/03/2020 2308 by Rico Junker, RN Outcome: Progressing   Problem: Nutrition: Goal: Dietary intake will improve 06/03/2020 2309 by Rico Junker, RN Outcome: Progressing 06/03/2020 2308 by Rico Junker, RN Outcome: Progressing

## 2020-06-03 NOTE — Progress Notes (Signed)
The patient heart rate spiked when transferring to rehab. Heart rate was 160s Afib rvr. Rehab RN Merrily Pew ) notified. It was decided for patient to remain on the unit to monitor and transfer in the morning. Family and patient educated effectively. Attending provider notified about existing heart rate, no new orders. Patient is currently asymptomatic and stable.

## 2020-06-03 NOTE — Discharge Summary (Signed)
Physician Discharge Summary  Katrina Randolph VWU:[id number] DOB: Jun 06, 1930 DOA: 05/30/2020  PCP: [name], PA-C  Admit date: 05/30/2020 Discharge date: 06/03/2020  Admitted From: Home Disposition: CIR  Recommendations for Outpatient Follow-up:  1. Follow up with PCP in 1-2 weeks 2. Please obtain BMP/CBC in one week 3. Please follow up neurology as scheduled:  Discharge Condition: Stable CODE STATUS: Full Diet recommendation: As tolerated  Brief/Interim Summary: Katrina Randolph a 87 y.o.femalewith a known history of atrial fibrillation not on anticoagulation, hypothyroidismpresents to the emergency department for evaluation of altered mental status. Patient was in a usual state of health until 18 hours prior to arrival she was last seen normal. She states that she lost her balance and fell. She subsequently reported right upper extremity and right lower extremity weakness associated with difficulty with word finding, mild confusion. Events were unwitnessed. Patient called her daughter.She lives alone, normally functionally independent, ambulates without assistance. At present patient reports difficulty with word finding, right sided weakness and decreased sensation on the right. Otherwise there has been no change in status. Patient has been taking medication as prescribed and there has been no recent change in medication or diet. No recent antibiotics. There has been no recent illness, hospitalizations, travel or sick contacts.  Patient admitted as above, found to have acute left posterior MCA stroke, neurology was consulted at intake, lengthy discussion with family about need for full dose anticoagulation in the setting of A. fib, there was questionable history of bleeding however on further exam with family this appears to have been a transient event post procedure and she has not had any personal history of profound GI bleeding or hospitalization for bleeding.  Patient  otherwise stable and agreeable for discharge to CIR, close follow-up with cardiology, neurology and PCP in the next few weeks as scheduled.  Patient was unable to transition to CIR yesterday due to bed availability/insurance approval - how cleared and remains medically stable for discharge.  Discharge Diagnoses:  Active Problems:   CVA (cerebral vascular accident) [facility])    Discharge Instructions  Discharge Instructions    Diet - low sodium heart healthy   Complete by: As directed    Increase activity slowly   Complete by: As directed      Allergies as of 06/03/2020      Reactions   Sulfa Antibiotics    "feels bad"      Medication List    STOP taking these medications   traZODone 50 MG tablet Commonly known as: DESYREL     TAKE these medications   apixaban 5 MG Tabs tablet Commonly known as: ELIQUIS Take 1 tablet (5 mg total) by mouth 2 (two) times daily.   atorvastatin 10 MG tablet Commonly known as: LIPITOR Take 2 tablets (20 mg total) by mouth daily.   esomeprazole 20 MG capsule Commonly known as: NEXIUM Take 20 mg by mouth daily.   furosemide 20 MG tablet Commonly known as: LASIX Take 20 mg by mouth daily as needed for fluid.   levothyroxine 112 MCG tablet Commonly known as: SYNTHROID Take 1 tablet (112 mcg total) by mouth every morning.   metoprolol succinate 25 MG 24 hr tablet Commonly known as: TOPROL-XL Take 1 tablet (25 mg total) by mouth daily. Take with or immediately following a meal. What changed:   medication strength  how much to take   sertraline 50 MG tablet Commonly known as: ZOLOFT Take 50 mg by mouth daily.       Allergies  Allergen Reactions  . Sulfa Antibiotics     "feels bad"    Consultations: Neuro, cardiology  Procedures/Studies: DG Chest 2 View  Result Date: 05/31/2020 CLINICAL DATA:  TIA EXAM: CHEST - 2 VIEW COMPARISON:  Radiograph 10/12/2018 FINDINGS: Diffuse pulmonary vascular congestion with hazy and streaky  opacities in the mid to lower lungs and more patchy, coalescent opacity in the retrocardiac space. No pneumothorax or visible effusion. Biapical pleuroparenchymal scarring. Stable cardiomegaly with a calcified, tortuous aorta. The osseous structures appear diffusely demineralized which may limit detection of small or nondisplaced fractures. No acute osseous or soft tissue abnormality. Degenerative changes are present in the imaged spine and shoulders. Telemetry leads overlie the chest. IMPRESSION: Diffuse pulmonary vascular congestion with hazy and streaky opacities in the mid to lower lungs and more patchy, coalescent opacity in the retrocardiac space. Findings may reflect some developing edema and/or atelectasis. Infection less favored though not fully excluded in the appropriate clinical context. Electronically Signed   By: Lovena Le M.D.   On: 05/31/2020 00:11   CT Head Wo Contrast  Result Date: 05/30/2020 CLINICAL DATA:  Altered mental status, last known normal 12 hours ago EXAM: CT HEAD WITHOUT CONTRAST TECHNIQUE: Contiguous axial images were obtained from the base of the skull through the vertex without intravenous contrast. COMPARISON:  None. FINDINGS: Brain: No evidence of acute large vascular territory infarction, hemorrhage, hydrocephalus, extra-axial collection or mass lesion/mass effect. Age related global parenchymal volume loss with ex vacuo dilatation of ventricular system and prominent extra-axial spaces. There are patchy subcortical and periventricular white matter hypodensities which are nonspecific but favored represent sequela of chronic ischemic small vessel white matter disease. Vascular: No hyperdense vessel. Atherosclerotic calcifications of the intracranial portions of the internal carotid and vertebral arteries. Skull: Hyperostosis frontalis. Negative for fracture or focal lesion. Sinuses/Orbits: The paranasal sinuses and mastoid air cells are predominantly clear. Pneumatized  petrous apices. Orbits are grossly unremarkable. Other: None IMPRESSION: 1. No acute intracranial findings. 2. Age related global parenchymal volume loss and chronic ischemic small vessel white matter disease. Electronically Signed   By: Dahlia Bailiff MD   On: 05/30/2020 14:34   MR ANGIO HEAD WO CONTRAST  Result Date: 05/30/2020 CLINICAL DATA:  Neuro deficit, acute stroke suspected. EXAM: MRI HEAD WITHOUT CONTRAST MRA HEAD WITHOUT CONTRAST TECHNIQUE: Multiplanar, multiecho pulse sequences of the brain and surrounding structures were obtained without intravenous contrast. Angiographic images of the head were obtained using MRA technique without contrast. COMPARISON:  None. FINDINGS: MRI HEAD FINDINGS Brain: Multiple acute infarcts in the left MCA territory, including acute infarct of the posterior left insula, left periventricular frontal lobe and left frontoparietal cortex superiorly. There is T2/FLAIR hyperintense mild edema associated with these infarcts. No substantial mass effect. No midline shift. Basal cisterns are patent. No evidence of acute hemorrhage. No extra-axial fluid collections. Additional moderate T2/FLAIR hyperintensities within the white matter and pons, nonspecific but most likely related to chronic microvascular ischemic disease. Frontal predominant cerebral volume loss with associated prominence of the overlying extra-axial spaces. Vascular: Major arterial flow voids are maintained at the skull base. Skull and upper cervical spine: Normal marrow signal. Sinuses/Orbits: Clear sinuses.  Unremarkable orbits. Other: No sizable mastoid effusions. MRA HEAD FINDINGS Anterior circulation: Bilateral ICAs are patent through the carotid terminus. Bilateral M1 MCAs are patent. Left proximal M2 MCA inferior division is poorly visualized, but within an area of artifact. There is occlusion of two other proximal left M2 MCA branches in the left sylvian fissure (see series  24, images 41 and 45).  Additionally, there is suspected occlusion of a more distal left M2/M3 MCA branch (series 24, image 30). Smaller left A1 ACA, likely congenital. Otherwise, patent ACAs. Possible small (approximately 2 mm) outpouching in the region of the right ICA terminus. Posterior circulation: Patent intradural vertebral arteries and basilar artery. Right fetal type PCA. Patent proximal posterior cerebral arteries with suspected mild stenosis of the right P2 PCA. IMPRESSION: 1. Left posterior MCA territory acute infarcts with occlusion of multiple left M2 and M2/M3 MCA branches, as described above. Associated edema without substantial mass effect. 2. Moderate chronic microvascular ischemic disease. 3. Frontal predominant cerebral volume loss. 4. Possible small (approximately 2 mm) aneurysm in the region of the right ICA terminus. A CTA could further characterize if clinically indicated Critical/urgent findings discussed with Dr. Rory Percy at 6:14 p.m via telephone. Electronically Signed   By: Margaretha Sheffield MD   On: 05/30/2020 18:43   MR BRAIN WO CONTRAST  Result Date: 05/30/2020 CLINICAL DATA:  Neuro deficit, acute stroke suspected. EXAM: MRI HEAD WITHOUT CONTRAST MRA HEAD WITHOUT CONTRAST TECHNIQUE: Multiplanar, multiecho pulse sequences of the brain and surrounding structures were obtained without intravenous contrast. Angiographic images of the head were obtained using MRA technique without contrast. COMPARISON:  None. FINDINGS: MRI HEAD FINDINGS Brain: Multiple acute infarcts in the left MCA territory, including acute infarct of the posterior left insula, left periventricular frontal lobe and left frontoparietal cortex superiorly. There is T2/FLAIR hyperintense mild edema associated with these infarcts. No substantial mass effect. No midline shift. Basal cisterns are patent. No evidence of acute hemorrhage. No extra-axial fluid collections. Additional moderate T2/FLAIR hyperintensities within the white matter and pons,  nonspecific but most likely related to chronic microvascular ischemic disease. Frontal predominant cerebral volume loss with associated prominence of the overlying extra-axial spaces. Vascular: Major arterial flow voids are maintained at the skull base. Skull and upper cervical spine: Normal marrow signal. Sinuses/Orbits: Clear sinuses.  Unremarkable orbits. Other: No sizable mastoid effusions. MRA HEAD FINDINGS Anterior circulation: Bilateral ICAs are patent through the carotid terminus. Bilateral M1 MCAs are patent. Left proximal M2 MCA inferior division is poorly visualized, but within an area of artifact. There is occlusion of two other proximal left M2 MCA branches in the left sylvian fissure (see series 24, images 41 and 45). Additionally, there is suspected occlusion of a more distal left M2/M3 MCA branch (series 24, image 30). Smaller left A1 ACA, likely congenital. Otherwise, patent ACAs. Possible small (approximately 2 mm) outpouching in the region of the right ICA terminus. Posterior circulation: Patent intradural vertebral arteries and basilar artery. Right fetal type PCA. Patent proximal posterior cerebral arteries with suspected mild stenosis of the right P2 PCA. IMPRESSION: 1. Left posterior MCA territory acute infarcts with occlusion of multiple left M2 and M2/M3 MCA branches, as described above. Associated edema without substantial mass effect. 2. Moderate chronic microvascular ischemic disease. 3. Frontal predominant cerebral volume loss. 4. Possible small (approximately 2 mm) aneurysm in the region of the right ICA terminus. A CTA could further characterize if clinically indicated Critical/urgent findings discussed with Dr. Rory Percy at 6:14 p.m via telephone. Electronically Signed   By: Margaretha Sheffield MD   On: 05/30/2020 18:43   DG Swallowing Func-Speech Pathology  Result Date: 06/01/2020 Objective Swallowing Evaluation: Type of Study: MBS-Modified Barium Swallow Study  Patient Details Name:  Manvir Thorson MRN: [id number] Date of Birth: 1930/09/06 Today's Date: 06/01/2020 Time: SLP Start Time (ACUTE ONLY): 1003 -SLP Stop Time (ACUTE  ONLY): 1017 SLP Time Calculation (min) (ACUTE ONLY): 14 min Past Medical History: Past Medical History: Diagnosis Date . Atrial fibrillation [facility])  Past Surgical History: Past Surgical History: Procedure Laterality Date . ABDOMINAL HYSTERECTOMY   . BIOPSY  10/19/2019  Procedure: BIOPSY;  Surgeon: [name], MD;  Location: [facility];  Service: Gastroenterology;; . BREAST LUMPECTOMY   . CHOLECYSTECTOMY   . COLONOSCOPY WITH PROPOFOL N/A 10/19/2019  Procedure: COLONOSCOPY WITH PROPOFOL;  Surgeon: [name], MD;  Location: [facility];  Service: Gastroenterology;  Laterality: N/A; . ESOPHAGOGASTRODUODENOSCOPY (EGD) WITH PROPOFOL N/A 10/19/2019  Procedure: ESOPHAGOGASTRODUODENOSCOPY (EGD) WITH PROPOFOL;  Surgeon: [name], MD;  Location: [facility];  Service: Gastroenterology;  Laterality: N/A; . POLYPECTOMY  10/19/2019  Procedure: POLYPECTOMY;  Surgeon: [name], MD;  Location: [facility];  Service: Gastroenterology;; HPI: Pt is an 87 y.o. female with a PMHx of permanent atrial fibrillation with RVT not on AC, HTN, HLD not on statin, orthostatic hypotension, symptomatic recurrent anemia, GERD, depression, breast cancer, hypothyroidism,  who presented to the [facility] ED with a chief complaint of inability to communicate effectively and decreased sensation on her right side. Daughter also noted some difficulty walking and right sided weakness. She was out of the time window for tPA at the time of arrival to the ED. MRI brain revealed left posterior MCA territory acute infarctions with occlusion of multiple left M2 and M2/M3 branches.  Chest CT (05/30/20) revealed "Diffuse pulmonary vascular congestion with hazy and streaky opacities in the mid to lower lungs and more patchy, coalescent opacity in the retrocardiac space. Findings may reflect some  developing edema and/or atelectasis. Infection less favored though not fully excluded in the appropriate clinical context. Per NP note (05/31/20), "passed nursing bedside swallow but coughing during my visit today with liquids". BSE requested.  No data recorded Assessment / Plan / Recommendation CHL IP CLINICAL IMPRESSIONS 06/01/2020 Clinical Impression Pt presents with normal oropharyngeal swallow. With all solid and liquid pt demonstrated adequate oral manipulation, cohesion and timely swallow trigger with strong pharyngeal peristalsis and laryngeal closure which was evident with absence of penetration/aspiration and normal amounts of residuals in vallecula and pyriform sinuses. It should be noted, however, that pt exhibited initial difficulty obtaining anterior- posterior oral transit of whole pill with thin liquids and eventually lodged in vallecular space briefly before fully propelled into cervical esophagus with help of puree wash. Cough x1 noted after swallowing whole pill, but no evidence of airway compromise with fluoro turned on. Recommend pt continue on regular, thin liquid diet with meds whole (puree wash as needed). SLP to f/u for tolerance and education. SLP Visit Diagnosis Dysphagia, unspecified (R13.10) Attention and concentration deficit following -- Frontal lobe and executive function deficit following -- Impact on safety and function No limitations   CHL IP TREATMENT RECOMMENDATION 06/01/2020 Treatment Recommendations Therapy as outlined in treatment plan below   Prognosis 06/01/2020 Prognosis for Safe Diet Advancement Good Barriers to Reach Goals Language deficits Barriers/Prognosis Comment -- CHL IP DIET RECOMMENDATION 06/01/2020 SLP Diet Recommendations Regular solids;Thin liquid Liquid Administration via Straw;Cup Medication Administration Whole meds with liquid Compensations Slow rate;Small sips/bites Postural Changes Seated upright at 90 degrees   CHL IP OTHER RECOMMENDATIONS 06/01/2020  Recommended Consults -- Oral Care Recommendations Oral care BID Other Recommendations --   CHL IP FOLLOW UP RECOMMENDATIONS 06/01/2020 Follow up Recommendations Inpatient Rehab   CHL IP FREQUENCY AND DURATION 06/01/2020 Speech Therapy Frequency (ACUTE ONLY) min 2x/week Treatment Duration 2 weeks      CHL IP  ORAL PHASE 06/01/2020 Oral Phase WFL Oral - Pudding Teaspoon -- Oral - Pudding Cup -- Oral - Honey Teaspoon -- Oral - Honey Cup -- Oral - Nectar Teaspoon -- Oral - Nectar Cup -- Oral - Nectar Straw -- Oral - Thin Teaspoon -- Oral - Thin Cup -- Oral - Thin Straw -- Oral - Puree -- Oral - Mech Soft -- Oral - Regular -- Oral - Multi-Consistency -- Oral - Pill -- Oral Phase - Comment --  CHL IP PHARYNGEAL PHASE 06/01/2020 Pharyngeal Phase WFL Pharyngeal- Pudding Teaspoon -- Pharyngeal -- Pharyngeal- Pudding Cup -- Pharyngeal -- Pharyngeal- Honey Teaspoon -- Pharyngeal -- Pharyngeal- Honey Cup -- Pharyngeal -- Pharyngeal- Nectar Teaspoon -- Pharyngeal -- Pharyngeal- Nectar Cup -- Pharyngeal -- Pharyngeal- Nectar Straw -- Pharyngeal -- Pharyngeal- Thin Teaspoon -- Pharyngeal -- Pharyngeal- Thin Cup -- Pharyngeal -- Pharyngeal- Thin Straw -- Pharyngeal -- Pharyngeal- Puree -- Pharyngeal -- Pharyngeal- Mechanical Soft -- Pharyngeal -- Pharyngeal- Regular -- Pharyngeal -- Pharyngeal- Multi-consistency -- Pharyngeal -- Pharyngeal- Pill -- Pharyngeal -- Pharyngeal Comment --  CHL IP CERVICAL ESOPHAGEAL PHASE 06/01/2020 Cervical Esophageal Phase WFL Pudding Teaspoon -- Pudding Cup -- Honey Teaspoon -- Honey Cup -- Nectar Teaspoon -- Nectar Cup -- Nectar Straw -- Thin Teaspoon -- Thin Cup -- Thin Straw -- Puree -- Mechanical Soft -- Regular -- Multi-consistency -- Pill -- Cervical Esophageal Comment -- Ellwood Dense, MA, CCC-SLP Acute Rehabilitation Services Office Number: [phone] Acie Fredrickson 06/01/2020, 11:39 AM              ECHOCARDIOGRAM COMPLETE  Result Date: 05/31/2020    ECHOCARDIOGRAM REPORT   Patient Name:    LESETTE BUSEY Date of Exam: 05/31/2020 Medical Rec #:  [id number]             Height:       62.0 in Accession #:    [id number]            Weight:       169.3 lb Date of Birth:  12-Jul-1930             BSA:          1.781 m Patient Age:    87 years              BP:           129/77 mmHg Patient Gender: F                     HR:           103 bpm. Exam Location:  Inpatient Procedure: 2D Echo, Cardiac Doppler and Color Doppler Indications:    Stroke I63.9  History:        Patient has prior history of Echocardiogram examinations, most                 recent 10/13/2019. Arrythmias:Atrial Fibrillation.  Sonographer:    Tiffany Dance Referring Phys: [id number] Welch  1. Left ventricular ejection fraction, by estimation, is 55 to 60%. The left ventricle has normal function. The left ventricle has no regional wall motion abnormalities. Left ventricular diastolic function could not be evaluated.  2. Right ventricular systolic function is moderately reduced. The right ventricular size is mildly enlarged. There is severely elevated pulmonary artery systolic pressure. The estimated right ventricular systolic pressure is 123XX123 mmHg.  3. Left atrial size was severely dilated.  4. Right atrial size was severely dilated.  5. The pericardial effusion is posterior  to the left ventricle.  6. The mitral valve is normal in structure. Mild mitral valve regurgitation. No evidence of mitral stenosis.  7. Tricuspid valve regurgitation is mild to moderate.  8. The aortic valve is normal in structure. Aortic valve regurgitation is not visualized. No aortic stenosis is present.  9. The inferior vena cava is dilated in size with >50% respiratory variability, suggesting right atrial pressure of 8 mmHg. FINDINGS  Left Ventricle: Left ventricular ejection fraction, by estimation, is 55 to 60%. The left ventricle has normal function. The left ventricle has no regional wall motion abnormalities. The left ventricular internal  cavity size was normal in size. There is  no left ventricular hypertrophy. Left ventricular diastolic function could not be evaluated due to atrial fibrillation. Left ventricular diastolic function could not be evaluated. Right Ventricle: The right ventricular size is mildly enlarged. No increase in right ventricular wall thickness. Right ventricular systolic function is moderately reduced. There is severely elevated pulmonary artery systolic pressure. The tricuspid regurgitant velocity is 3.69 m/s, and with an assumed right atrial pressure of 8 mmHg, the estimated right ventricular systolic pressure is 62.5 mmHg. Left Atrium: Left atrial size was severely dilated. Right Atrium: Right atrial size was severely dilated. Pericardium: Trivial pericardial effusion is present. The pericardial effusion is posterior to the left ventricle. Mitral Valve: The mitral valve is normal in structure. Mild mitral valve regurgitation. No evidence of mitral valve stenosis. Tricuspid Valve: The tricuspid valve is normal in structure. Tricuspid valve regurgitation is mild to moderate. No evidence of tricuspid stenosis. Aortic Valve: The aortic valve is normal in structure. Aortic valve regurgitation is not visualized. No aortic stenosis is present. Pulmonic Valve: The pulmonic valve was normal in structure. Pulmonic valve regurgitation is trivial. No evidence of pulmonic stenosis. Aorta: The aortic root is normal in size and structure. Venous: The inferior vena cava is dilated in size with greater than 50% respiratory variability, suggesting right atrial pressure of 8 mmHg. IAS/Shunts: The interatrial septum appears to be lipomatous. No atrial level shunt detected by color flow Doppler.  LEFT VENTRICLE PLAX 2D LVIDd:         4.20 cm LVIDs:         3.30 cm LV PW:         1.10 cm LV IVS:        1.10 cm LVOT diam:     1.80 cm LV SV:         33 LV SV Index:   18 LVOT Area:     2.54 cm  RIGHT VENTRICLE          IVC RV Basal diam:  3.60 cm   IVC diam: 2.60 cm RV Mid diam:    2.60 cm TAPSE (M-mode): 1.3 cm LEFT ATRIUM             Index       RIGHT ATRIUM           Index LA diam:        5.70 cm 3.20 cm/m  RA Area:     30.60 cm LA Vol (A2C):   93.1 ml 52.27 ml/m RA Volume:   110.00 ml 61.76 ml/m LA Vol (A4C):   81.0 ml 45.48 ml/m LA Biplane Vol: 87.7 ml 49.24 ml/m  AORTIC VALVE LVOT Vmax:   69.45 cm/s LVOT Vmean:  45.100 cm/s LVOT VTI:    0.129 m  AORTA Ao Root diam: 3.00 cm Ao Asc diam:  3.00 cm MITRAL VALVE  TRICUSPID VALVE MV Area (PHT): 3.27 cm     TR Peak grad:   54.5 mmHg MV Decel Time: 232 msec     TR Vmax:        369.00 cm/s MV E velocity: 112.00 cm/s MV A velocity: 41.20 cm/s   SHUNTS MV E/A ratio:  2.72         Systemic VTI:  0.13 m                             Systemic Diam: 1.80 cm Fransico Him MD Electronically signed by Fransico Him MD Signature Date/Time: 05/31/2020/3:43:30 PM    Final    VAS US CAROTID  Result Date: 05/31/2020 Carotid Arterial Duplex Study Indications: CVA. Performing Technologist: Abram Sander RVS  Examination Guidelines: A complete evaluation includes B-mode imaging, spectral Doppler, color Doppler, and power Doppler as needed of all accessible portions of each vessel. Bilateral testing is considered an integral part of a complete examination. Limited examinations for reoccurring indications may be performed as noted.  Right Carotid Findings: +----------+--------+--------+--------+------------------+--------+           PSV cm/sEDV cm/sStenosisPlaque DescriptionComments +----------+--------+--------+--------+------------------+--------+ CCA Prox  41      9               heterogenous               +----------+--------+--------+--------+------------------+--------+ CCA Distal41      10              heterogenous               +----------+--------+--------+--------+------------------+--------+ ICA Prox  73      18      1-39%   heterogenous                +----------+--------+--------+--------+------------------+--------+ ICA Distal93      24                                         +----------+--------+--------+--------+------------------+--------+ ECA       90                                                 +----------+--------+--------+--------+------------------+--------+ +----------+--------+-------+--------+-------------------+           PSV cm/sEDV cmsDescribeArm Pressure (mmHG) +----------+--------+-------+--------+-------------------+ [id number]                                         +----------+--------+-------+--------+-------------------+ +---------+--------+--+--------+--+---------+ VertebralPSV cm/s66EDV cm/s10Antegrade +---------+--------+--+--------+--+---------+  Left Carotid Findings: +----------+--------+--------+--------+------------------+--------+           PSV cm/sEDV cm/sStenosisPlaque DescriptionComments +----------+--------+--------+--------+------------------+--------+ CCA Prox  48      7               heterogenous               +----------+--------+--------+--------+------------------+--------+ CCA Distal33      5               heterogenous               +----------+--------+--------+--------+------------------+--------+ ICA Prox  102     22      1-39%  heterogenous               +----------+--------+--------+--------+------------------+--------+ ICA Distal62      19                                         +----------+--------+--------+--------+------------------+--------+ ECA       71      8                                          +----------+--------+--------+--------+------------------+--------+ +----------+--------+--------+--------+-------------------+           PSV cm/sEDV cm/sDescribeArm Pressure (mmHG) +----------+--------+--------+--------+-------------------+ [id number]                                           +----------+--------+--------+--------+-------------------+ +---------+--------+--+--------+--+---------+ VertebralPSV cm/s39EDV cm/s13Antegrade +---------+--------+--+--------+--+---------+   Summary: Right Carotid: Velocities in the right ICA are consistent with a 1-39% stenosis. Left Carotid: Velocities in the left ICA are consistent with a 1-39% stenosis. Vertebrals: Bilateral vertebral arteries demonstrate antegrade flow. *See table(s) above for measurements and observations.  Electronically signed by Jamelle Haring on 05/31/2020 at 4:30:40 PM.    Final      Subjective: No acute issues or events overnight   Discharge Exam: Vitals:   06/02/20 2131 06/03/20 0500  BP: (!) 167/60 135/74  Pulse: 98 100  Resp: 17 17  Temp: 97.8 F (36.6 C) 97.8 F (36.6 C)  SpO2: 97% 99%   Vitals:   06/02/20 0743 06/02/20 1446 06/02/20 2131 06/03/20 0500  BP: (!) 123/106 (!) 152/87 (!) 167/60 135/74  Pulse: 87 96 98 100  Resp: 20 16 17 17   Temp: 98.6 F (37 C) 97.9 F (36.6 C) 97.8 F (36.6 C) 97.8 F (36.6 C)  TempSrc: Oral Oral Oral Oral  SpO2: 96% 96% 97% 99%  Weight:      Height:        General: Pt is alert, awake, not in acute distress Cardiovascular: RRR, S1/S2 +, no rubs, no gallops Respiratory: CTA bilaterally, no wheezing, no rhonchi Abdominal: Soft, NT, ND, bowel sounds + Extremities: no edema, no cyanosis    The results of significant diagnostics from this hospitalization (including imaging, microbiology, ancillary and laboratory) are listed below for reference.     Microbiology: Recent Results (from the past 240 hour(s))  Resp Panel by RT-PCR (Flu A&B, Covid) Nasopharyngeal Swab     Status: None   Collection Time: 05/30/20  3:29 PM   Specimen: Nasopharyngeal Swab; Nasopharyngeal(NP) swabs in vial transport medium  Result Value Ref Range Status   SARS Coronavirus 2 by RT PCR NEGATIVE NEGATIVE Final    Comment: (NOTE) SARS-CoV-2 target nucleic acids are NOT  DETECTED.  The SARS-CoV-2 RNA is generally detectable in upper respiratory specimens during the acute phase of infection. The lowest concentration of SARS-CoV-2 viral copies this assay can detect is 138 copies/mL. A negative result does not preclude SARS-Cov-2 infection and should not be used as the sole basis for treatment or other patient management decisions. A negative result may occur with  improper specimen collection/handling, submission of specimen other than nasopharyngeal swab, presence of viral mutation(s) within the areas targeted by this assay, and inadequate number of viral copies(<138 copies/mL). A  negative result must be combined with clinical observations, patient history, and epidemiological information. The expected result is Negative.  Fact Sheet for Patients:  EntrepreneurPulse.com.au  Fact Sheet for Healthcare Providers:  IncredibleEmployment.be  This test is no t yet approved or cleared by the [place] FDA and  has been authorized for detection and/or diagnosis of SARS-CoV-2 by FDA under an Emergency Use Authorization (EUA). This EUA will remain  in effect (meaning this test can be used) for the duration of the COVID-19 declaration under Section 564(b)(1) of the Act, 21 U.S.C.section 360bbb-3(b)(1), unless the authorization is terminated  or revoked sooner.       Influenza A by PCR NEGATIVE NEGATIVE Final   Influenza B by PCR NEGATIVE NEGATIVE Final    Comment: (NOTE) The Xpert Xpress SARS-CoV-2/FLU/RSV plus assay is intended as an aid in the diagnosis of influenza from Nasopharyngeal swab specimens and should not be used as a sole basis for treatment. Nasal washings and aspirates are unacceptable for Xpert Xpress SARS-CoV-2/FLU/RSV testing.  Fact Sheet for Patients: EntrepreneurPulse.com.au  Fact Sheet for Healthcare Providers: IncredibleEmployment.be  This test is not yet  approved or cleared by the [place] FDA and has been authorized for detection and/or diagnosis of SARS-CoV-2 by FDA under an Emergency Use Authorization (EUA). This EUA will remain in effect (meaning this test can be used) for the duration of the COVID-19 declaration under Section 564(b)(1) of the Act, 21 U.S.C. section 360bbb-3(b)(1), unless the authorization is terminated or revoked.  Performed at [facility], [place] [address] [place], [place] [address]      Labs: BNP (last 3 results) Recent Labs    10/12/19 1427  BNP 0000000*   Basic Metabolic Panel: Recent Labs  Lab 05/30/20 1338 05/31/20 0022 06/01/20 0112  NA 139  --  140  K 3.4*  --  3.8  CL 106  --  109  CO2 23  --  23  GLUCOSE 118*  --  115*  BUN 16  --  11  CREATININE 0.91 0.82 1.02*  CALCIUM 8.8*  --  8.7*   Liver Function Tests: Recent Labs  Lab 05/30/20 1338  AST 16  ALT 13  ALKPHOS 60  BILITOT 1.7*  PROT 7.3  ALBUMIN 4.0   No results for input(s): LIPASE, AMYLASE in the last 168 hours. No results for input(s): AMMONIA in the last 168 hours. CBC: Recent Labs  Lab 05/30/20 1338 05/31/20 0022 06/01/20 0112 06/02/20 0059 06/03/20 0033  WBC 8.1 8.6 6.9 6.8 8.9  NEUTROABS 6.6  --   --   --   --   HGB 12.3 12.2 11.5* 11.8* 12.4  HCT 40.7 39.5 36.7 38.4 40.8  MCV 90.0 88.4 87.6 88.3 88.5  PLT 196 137* 175 174 204   Cardiac Enzymes: No results for input(s): CKTOTAL, CKMB, CKMBINDEX, TROPONINI in the last 168 hours. BNP: Invalid input(s): POCBNP CBG: No results for input(s): GLUCAP in the last 168 hours. D-Dimer No results for input(s): DDIMER in the last 72 hours. Hgb A1c No results for input(s): HGBA1C in the last 72 hours. Lipid Profile No results for input(s): CHOL, HDL, LDLCALC, TRIG, CHOLHDL, LDLDIRECT in the last 72 hours. Thyroid function studies No results for input(s): TSH, T4TOTAL, T3FREE, THYROIDAB in the last 72 hours.  Invalid input(s):  FREET3 Anemia work up No results for input(s): VITAMINB12, FOLATE, FERRITIN, TIBC, IRON, RETICCTPCT in the last 72 hours. Urinalysis    Component Value Date/Time   COLORURINE AMBER (A) 05/30/2020  [place] 05/30/2020 1332   LABSPEC 1.023 05/30/2020 1332   PHURINE 5.0 05/30/2020 1332   GLUCOSEU NEGATIVE 05/30/2020 1332   HGBUR SMALL (A) 05/30/2020 1332   BILIRUBINUR NEGATIVE 05/30/2020 1332   KETONESUR 5 (A) 05/30/2020 1332   PROTEINUR 30 (A) 05/30/2020 1332   NITRITE NEGATIVE 05/30/2020 1332   LEUKOCYTESUR NEGATIVE 05/30/2020 1332   Sepsis Labs Invalid input(s): PROCALCITONIN,  WBC,  LACTICIDVEN Microbiology Recent Results (from the past 240 hour(s))  Resp Panel by RT-PCR (Flu A&B, Covid) Nasopharyngeal Swab     Status: None   Collection Time: 05/30/20  3:29 PM   Specimen: Nasopharyngeal Swab; Nasopharyngeal(NP) swabs in vial transport medium  Result Value Ref Range Status   SARS Coronavirus 2 by RT PCR NEGATIVE NEGATIVE Final    Comment: (NOTE) SARS-CoV-2 target nucleic acids are NOT DETECTED.  The SARS-CoV-2 RNA is generally detectable in upper respiratory specimens during the acute phase of infection. The lowest concentration of SARS-CoV-2 viral copies this assay can detect is 138 copies/mL. A negative result does not preclude SARS-Cov-2 infection and should not be used as the sole basis for treatment or other patient management decisions. A negative result may occur with  improper specimen collection/handling, submission of specimen other than nasopharyngeal swab, presence of viral mutation(s) within the areas targeted by this assay, and inadequate number of viral copies(<138 copies/mL). A negative result must be combined with clinical observations, patient history, and epidemiological information. The expected result is Negative.  Fact Sheet for Patients:  EntrepreneurPulse.com.au  Fact Sheet for Healthcare Providers:   IncredibleEmployment.be  This test is no t yet approved or cleared by the [place] FDA and  has been authorized for detection and/or diagnosis of SARS-CoV-2 by FDA under an Emergency Use Authorization (EUA). This EUA will remain  in effect (meaning this test can be used) for the duration of the COVID-19 declaration under Section 564(b)(1) of the Act, 21 U.S.C.section 360bbb-3(b)(1), unless the authorization is terminated  or revoked sooner.       Influenza A by PCR NEGATIVE NEGATIVE Final   Influenza B by PCR NEGATIVE NEGATIVE Final    Comment: (NOTE) The Xpert Xpress SARS-CoV-2/FLU/RSV plus assay is intended as an aid in the diagnosis of influenza from Nasopharyngeal swab specimens and should not be used as a sole basis for treatment. Nasal washings and aspirates are unacceptable for Xpert Xpress SARS-CoV-2/FLU/RSV testing.  Fact Sheet for Patients: EntrepreneurPulse.com.au  Fact Sheet for Healthcare Providers: IncredibleEmployment.be  This test is not yet approved or cleared by the [place] FDA and has been authorized for detection and/or diagnosis of SARS-CoV-2 by FDA under an Emergency Use Authorization (EUA). This EUA will remain in effect (meaning this test can be used) for the duration of the COVID-19 declaration under Section 564(b)(1) of the Act, 21 U.S.C. section 360bbb-3(b)(1), unless the authorization is terminated or revoked.  Performed at [facility], [place] [address], [place], [place] [address]      Time coordinating discharge: Over 30 minutes  SIGNED:   Little Ishikawa, DO [facility] 06/03/2020, 7:23 AM Pager   If 7PM-7AM, please contact night-coverage [email or web address]

## 2020-06-04 LAB — CBC
HCT: 39.1 % (ref 36.0–46.0)
Hemoglobin: 12.1 g/dL (ref 12.0–15.0)
MCH: 27.2 pg (ref 26.0–34.0)
MCHC: 30.9 g/dL (ref 30.0–36.0)
MCV: 87.9 fL (ref 80.0–100.0)
Platelets: 167 10*3/uL (ref 150–400)
RBC: 4.45 MIL/uL (ref 3.87–5.11)
RDW: 16.2 % — ABNORMAL HIGH (ref 11.5–15.5)
WBC: 7.9 10*3/uL (ref 4.0–10.5)
nRBC: 0 % (ref 0.0–0.2)

## 2020-06-04 MED ORDER — METOPROLOL SUCCINATE ER 25 MG PO TB24
25.0000 mg | ORAL_TABLET | Freq: Every day | ORAL | Status: DC
  Administered 2020-06-04 – 2020-06-05 (×2): 25 mg via ORAL
  Filled 2020-06-04 (×2): qty 1

## 2020-06-04 NOTE — Progress Notes (Signed)
Physical Medicine and Rehabilitation Consult Reason for Consult: Altered mental status with decreased balance and speech difficulties Referring Physician: Dr. Avon Gully   HPI: Katrina Randolph is a 87 y.o. right-handed female with history of atrial fibrillation not on anticoagulation as patient did see [name] in the past for possible Watchman consideration of which patient declined, hypothyroidism, tobacco use.  Per chart review patient lives alone.  1 level home one-step to entry.  Independent with assistive device.  She is independent with homemaking/ADLs and still drives.  Presented 05/30/2020 with aphasia and decrease in balance as well as decreased sensation on her right side of acute onset.  CT/MRI as well as MRA head and neck showed left posterior MCA territory infarct with occlusion of multiple left M2 and M2/M3 MCA branches.  Moderate chronic microvascular ischemic disease.  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  Patient did not receive tPA.  Admission chemistries unremarkable except potassium 3.4 glucose 118.  Echocardiogram with ejection fraction of 55 to 60% no wall motion abnormalities.  Carotid Dopplers with no ICA stenosis.  Currently on aspirin for CVA prophylaxis.  Subcutaneous Lovenox for DVT prophylaxis.  Tolerating a regular diet.  Due to patient decreased functional ability and aphasia physical medicine rehab consult requested. Daughter at bedside.   Review of Systems  Constitutional: Negative for chills and fever.  HENT: Negative for hearing loss.   Eyes: Negative for blurred vision and double vision.  Respiratory: Negative for cough and shortness of breath.   Cardiovascular: Positive for palpitations. Negative for chest pain and leg swelling.  Gastrointestinal: Positive for constipation. Negative for heartburn, nausea and vomiting.  Genitourinary: Negative for dysuria and flank pain.  Musculoskeletal: Positive for joint  pain and myalgias.  Skin: Negative for rash.  Neurological: Positive for speech change and weakness.  All other systems reviewed and are negative.      Past Medical History:  Diagnosis Date  . Atrial fibrillation [facility])         Past Surgical History:  Procedure Laterality Date  . ABDOMINAL HYSTERECTOMY    . BIOPSY  10/19/2019   Procedure: BIOPSY;  Surgeon: [name], MD;  Location: [facility];  Service: Gastroenterology;;  . BREAST LUMPECTOMY    . CHOLECYSTECTOMY    . COLONOSCOPY WITH PROPOFOL N/A 10/19/2019   Procedure: COLONOSCOPY WITH PROPOFOL;  Surgeon: [name], MD;  Location: [facility];  Service: Gastroenterology;  Laterality: N/A;  . ESOPHAGOGASTRODUODENOSCOPY (EGD) WITH PROPOFOL N/A 10/19/2019   Procedure: ESOPHAGOGASTRODUODENOSCOPY (EGD) WITH PROPOFOL;  Surgeon: [name], MD;  Location: [facility];  Service: Gastroenterology;  Laterality: N/A;  . POLYPECTOMY  10/19/2019   Procedure: POLYPECTOMY;  Surgeon: [name], MD;  Location: [facility];  Service: Gastroenterology;;   No family history on file. Social History:  reports that she has quit smoking. Her smoking use included cigarettes. She has never used smokeless tobacco. She reports current alcohol use. She reports that she does not use drugs. Allergies:       Allergies  Allergen Reactions  . Sulfa Antibiotics     "feels bad"         Medications Prior to Admission  Medication Sig Dispense Refill  . esomeprazole (NEXIUM) 20 MG capsule Take 20 mg by mouth daily.    . furosemide (LASIX) 20 MG tablet Take 20 mg by mouth daily as needed for fluid.     Marland Kitchen levothyroxine (SYNTHROID) 112 MCG tablet Take 1 tablet (112  mcg total) by mouth every morning. 30 tablet 0  . metoprolol succinate (TOPROL-XL) 50 MG 24 hr tablet Take 1 tablet (50 mg total) by mouth daily. Take with or immediately following a meal. 30 tablet 1  . sertraline (ZOLOFT) 50 MG tablet Take 50 mg by  mouth daily.    . traZODone (DESYREL) 50 MG tablet Take 50 mg by mouth at bedtime as needed for sleep.       Home: Home Living Family/patient expects to be discharged to:: Private residence Living Arrangements: Alone Available Help at Discharge: Family,Available PRN/intermittently Type of Home: House Home Access: Stairs to enter [employer] of Steps: 1 Entrance Stairs-Rails: None Home Layout: One level Bathroom Shower/Tub: Multimedia programmer: Handicapped height [place]: Environmental consultant - 4 wheels,Cane - single point,Shower seat - built in Additional Comments: daughter lives 2 minutes away from patient.   Functional History: Prior Function Level of Independence: Independent with assistive device(s) Comments: pt stands in shower, has grab bars and a bench but doesn't use it. She is independent with homemaking/ADL's and does not alway use rollator to mobilize. Drives, grocery shops Functional Status:  Mobility: Bed Mobility Overal bed mobility: Needs Assistance Bed Mobility: Supine to Sit Supine to sit: Supervision General bed mobility comments: Supervision for safety. With dynamic sitting balance, required min A Transfers Overall transfer level: Needs assistance Equipment used: 2 person hand held assist Transfers: Sit to/from Stand Sit to Stand: Min assist,+2 physical assistance,+2 safety/equipment General transfer comment: Min A for steadying to stand. Ambulation/Gait Ambulation/Gait assistance: Min assist,Mod assist,+2 physical assistance,Max assist Gait Distance (Feet): 20 Feet Assistive device: 1 person hand held assist,None Gait Pattern/deviations: Step-through pattern,Decreased stride length,Staggering right General Gait Details: Pt tended to drag RLE at times and with decreased awareness of where RLE was in space. 2 major LOB to the R noted, one specifically when turning. Requiring mod to max A +2 for stability during LOB. Otherwise requiring  min to mod A for steadying. Gait velocity: Decreased  ADL: ADL Overall ADL's : Needs assistance/impaired Eating/Feeding: Set up,Sitting Grooming: Wash/dry hands,Wash/dry face,Oral care,Brushing hair,Set up,Sitting Upper Body Bathing: Set up,Supervision/ safety,Sitting Lower Body Bathing: Moderate assistance,Sit to/from stand Upper Body Dressing : Minimal assistance,Sitting Lower Body Dressing: Moderate assistance,Sit to/from stand Toilet Transfer: Moderate assistance,+2 for safety/equipment,Ambulation,Comfort height toilet,Grab bars,RW Toileting- Clothing Manipulation and Hygiene: Moderate assistance,Sit to/from stand Functional mobility during ADLs: Moderate assistance,+2 for safety/equipment General ADL Comments: Pt with balance deficits requiring up to mod A to correct  Cognition: Cognition Overall Cognitive Status: Difficult to assess Arousal/Alertness: Awake/alert Orientation Level: Oriented X4 Attention: Sustained,Selective Sustained Attention: Appears intact Selective Attention: Appears intact Memory: Appears intact Safety/Judgment: Appears intact Cognition Arousal/Alertness: Awake/alert Behavior During Therapy: WFL for tasks assessed/performed Overall Cognitive Status: Difficult to assess General Comments: Pt follows commands well Difficult to assess due to: Impaired communication  Blood pressure (!) 143/88, pulse 86, temperature 98.6 F (37 C), temperature source Oral, resp. rate 16, height 5\' 2"  (1.575 m), weight 76.8 kg, SpO2 94 %. Gen: no distress, normal appearing HEENT: oral mucosa pink and moist, NCAT Cardio: Irregularly irregular Chest: normal effort, normal rate of breathing Abd: soft, non-distended Ext: no edema Psych: pleasant, normal affect Skin: intact Neurological:     Comments: Patient is alert no acute distress.  Makes eye contact with examiner.  Appropriate for simple 1 and 2 word sentences for person and age.  She did have some difficulty with  phrases.  Follows simple commands. Expressive aphasia with word finding difficulties. 5/5  strength throughout.    Lab Results Last 24 Hours       Results for orders placed or performed during the hospital encounter of 05/30/20 (from the past 24 hour(s))  CBC     Status: Abnormal   Collection Time: 06/01/20  1:12 AM  Result Value Ref Range   WBC 6.9 4.0 - 10.5 K/uL   RBC 4.19 3.87 - 5.11 MIL/uL   Hemoglobin 11.5 (L) 12.0 - 15.0 g/dL   HCT 36.7 36.0 - 46.0 %   MCV 87.6 80.0 - 100.0 fL   MCH 27.4 26.0 - 34.0 pg   MCHC 31.3 30.0 - 36.0 g/dL   RDW 15.9 (H) 11.5 - 15.5 %   Platelets 175 150 - 400 K/uL   nRBC 0.0 0.0 - 0.2 %  Basic metabolic panel     Status: Abnormal   Collection Time: 06/01/20  1:12 AM  Result Value Ref Range   Sodium 140 135 - 145 mmol/L   Potassium 3.8 3.5 - 5.1 mmol/L   Chloride 109 98 - 111 mmol/L   CO2 23 22 - 32 mmol/L   Glucose, Bld 115 (H) 70 - 99 mg/dL   BUN 11 8 - 23 mg/dL   Creatinine, Ser 1.02 (H) 0.44 - 1.00 mg/dL   Calcium 8.7 (L) 8.9 - 10.3 mg/dL   GFR, Estimated 53 (L) >60 mL/min   Anion gap 8 5 - 15      Imaging Results (Last 48 hours)  DG Chest 2 View  Result Date: 05/31/2020 CLINICAL DATA:  TIA EXAM: CHEST - 2 VIEW COMPARISON:  Radiograph 10/12/2018 FINDINGS: Diffuse pulmonary vascular congestion with hazy and streaky opacities in the mid to lower lungs and more patchy, coalescent opacity in the retrocardiac space. No pneumothorax or visible effusion. Biapical pleuroparenchymal scarring. Stable cardiomegaly with a calcified, tortuous aorta. The osseous structures appear diffusely demineralized which may limit detection of small or nondisplaced fractures. No acute osseous or soft tissue abnormality. Degenerative changes are present in the imaged spine and shoulders. Telemetry leads overlie the chest. IMPRESSION: Diffuse pulmonary vascular congestion with hazy and streaky opacities in the mid to lower lungs and more patchy,  coalescent opacity in the retrocardiac space. Findings may reflect some developing edema and/or atelectasis. Infection less favored though not fully excluded in the appropriate clinical context. Electronically Signed   By: Lovena Le M.D.   On: 05/31/2020 00:11   CT Head Wo Contrast  Result Date: 05/30/2020 CLINICAL DATA:  Altered mental status, last known normal 12 hours ago EXAM: CT HEAD WITHOUT CONTRAST TECHNIQUE: Contiguous axial images were obtained from the base of the skull through the vertex without intravenous contrast. COMPARISON:  None. FINDINGS: Brain: No evidence of acute large vascular territory infarction, hemorrhage, hydrocephalus, extra-axial collection or mass lesion/mass effect. Age related global parenchymal volume loss with ex vacuo dilatation of ventricular system and prominent extra-axial spaces. There are patchy subcortical and periventricular white matter hypodensities which are nonspecific but favored represent sequela of chronic ischemic small vessel white matter disease. Vascular: No hyperdense vessel. Atherosclerotic calcifications of the intracranial portions of the internal carotid and vertebral arteries. Skull: Hyperostosis frontalis. Negative for fracture or focal lesion. Sinuses/Orbits: The paranasal sinuses and mastoid air cells are predominantly clear. Pneumatized petrous apices. Orbits are grossly unremarkable. Other: None IMPRESSION: 1. No acute intracranial findings. 2. Age related global parenchymal volume loss and chronic ischemic small vessel white matter disease. Electronically Signed   By: Dahlia Bailiff MD   On: 05/30/2020  14:34   MR ANGIO HEAD WO CONTRAST  Result Date: 05/30/2020 CLINICAL DATA:  Neuro deficit, acute stroke suspected. EXAM: MRI HEAD WITHOUT CONTRAST MRA HEAD WITHOUT CONTRAST TECHNIQUE: Multiplanar, multiecho pulse sequences of the brain and surrounding structures were obtained without intravenous contrast. Angiographic images of the head  were obtained using MRA technique without contrast. COMPARISON:  None. FINDINGS: MRI HEAD FINDINGS Brain: Multiple acute infarcts in the left MCA territory, including acute infarct of the posterior left insula, left periventricular frontal lobe and left frontoparietal cortex superiorly. There is T2/FLAIR hyperintense mild edema associated with these infarcts. No substantial mass effect. No midline shift. Basal cisterns are patent. No evidence of acute hemorrhage. No extra-axial fluid collections. Additional moderate T2/FLAIR hyperintensities within the white matter and pons, nonspecific but most likely related to chronic microvascular ischemic disease. Frontal predominant cerebral volume loss with associated prominence of the overlying extra-axial spaces. Vascular: Major arterial flow voids are maintained at the skull base. Skull and upper cervical spine: Normal marrow signal. Sinuses/Orbits: Clear sinuses.  Unremarkable orbits. Other: No sizable mastoid effusions. MRA HEAD FINDINGS Anterior circulation: Bilateral ICAs are patent through the carotid terminus. Bilateral M1 MCAs are patent. Left proximal M2 MCA inferior division is poorly visualized, but within an area of artifact. There is occlusion of two other proximal left M2 MCA branches in the left sylvian fissure (see series 24, images 41 and 45). Additionally, there is suspected occlusion of a more distal left M2/M3 MCA branch (series 24, image 30). Smaller left A1 ACA, likely congenital. Otherwise, patent ACAs. Possible small (approximately 2 mm) outpouching in the region of the right ICA terminus. Posterior circulation: Patent intradural vertebral arteries and basilar artery. Right fetal type PCA. Patent proximal posterior cerebral arteries with suspected mild stenosis of the right P2 PCA. IMPRESSION: 1. Left posterior MCA territory acute infarcts with occlusion of multiple left M2 and M2/M3 MCA branches, as described above. Associated edema without  substantial mass effect. 2. Moderate chronic microvascular ischemic disease. 3. Frontal predominant cerebral volume loss. 4. Possible small (approximately 2 mm) aneurysm in the region of the right ICA terminus. A CTA could further characterize if clinically indicated Critical/urgent findings discussed with Dr. Rory Percy at 6:14 p.m via telephone. Electronically Signed   By: Margaretha Sheffield MD   On: 05/30/2020 18:43   MR BRAIN WO CONTRAST  Result Date: 05/30/2020 CLINICAL DATA:  Neuro deficit, acute stroke suspected. EXAM: MRI HEAD WITHOUT CONTRAST MRA HEAD WITHOUT CONTRAST TECHNIQUE: Multiplanar, multiecho pulse sequences of the brain and surrounding structures were obtained without intravenous contrast. Angiographic images of the head were obtained using MRA technique without contrast. COMPARISON:  None. FINDINGS: MRI HEAD FINDINGS Brain: Multiple acute infarcts in the left MCA territory, including acute infarct of the posterior left insula, left periventricular frontal lobe and left frontoparietal cortex superiorly. There is T2/FLAIR hyperintense mild edema associated with these infarcts. No substantial mass effect. No midline shift. Basal cisterns are patent. No evidence of acute hemorrhage. No extra-axial fluid collections. Additional moderate T2/FLAIR hyperintensities within the white matter and pons, nonspecific but most likely related to chronic microvascular ischemic disease. Frontal predominant cerebral volume loss with associated prominence of the overlying extra-axial spaces. Vascular: Major arterial flow voids are maintained at the skull base. Skull and upper cervical spine: Normal marrow signal. Sinuses/Orbits: Clear sinuses.  Unremarkable orbits. Other: No sizable mastoid effusions. MRA HEAD FINDINGS Anterior circulation: Bilateral ICAs are patent through the carotid terminus. Bilateral M1 MCAs are patent. Left proximal M2 MCA inferior division  chronic microvascular ischemic disease. Frontal predominant cerebral volume loss with associated prominence of the overlying extra-axial spaces. Vascular: Major arterial flow voids are maintained at the skull base. Skull and upper cervical spine: Normal marrow signal. Sinuses/Orbits: Clear sinuses.  Unremarkable orbits. Other: No sizable mastoid effusions. MRA HEAD FINDINGS Anterior circulation: Bilateral ICAs are patent through the carotid terminus. Bilateral M1 MCAs are patent. Left proximal M2 MCA inferior division is poorly visualized, but within an area of artifact.  There is occlusion of two other proximal left M2 MCA branches in the left sylvian fissure (see series 24, images 41 and 45). Additionally, there is suspected occlusion of a more distal left M2/M3 MCA branch (series 24, image 30). Smaller left A1 ACA, likely congenital. Otherwise, patent ACAs. Possible small (approximately 2 mm) outpouching in the region of the right ICA terminus. Posterior circulation: Patent intradural vertebral arteries and basilar artery. Right fetal type PCA. Patent proximal posterior cerebral arteries with suspected mild stenosis of the right P2 PCA. IMPRESSION: 1. Left posterior MCA territory acute infarcts with occlusion of multiple left M2 and M2/M3 MCA branches, as described above. Associated edema without substantial mass effect. 2. Moderate chronic microvascular ischemic disease. 3. Frontal predominant cerebral volume loss. 4. Possible small (approximately 2 mm) aneurysm in the region of the right ICA terminus. A CTA could further characterize if clinically indicated Critical/urgent findings discussed with Dr. Arora at 6:14 p.m via telephone. Electronically Signed   By: Frederick S Jones MD   On: 05/30/2020 18:43    ECHOCARDIOGRAM COMPLETE   Result Date: 05/31/2020    ECHOCARDIOGRAM REPORT   Patient Name:   Lilymarie COLEY Naugle Date of Exam: 05/31/2020 Medical Rec #:  [id number]             Height:       62.0 in Accession #:    [id number]            Weight:       169.3 lb Date of Birth:  05/23/1930             BSA:          1.781 m Patient Age:    89 years              BP:           129/77 mmHg Patient Gender: F                     HR:           103 bpm. Exam Location:  Inpatient Procedure: 2D Echo, Cardiac Doppler and Color Doppler Indications:    Stroke I63.9  History:        Patient has prior history of Echocardiogram examinations, most                 recent 10/13/2019. Arrythmias:Atrial Fibrillation.  Sonographer:    Tiffany Dance Referring Phys: [id number] ALEXIS HUGELMEYER  IMPRESSIONS  1. Left ventricular ejection fraction, by estimation, is 55 to 60%. The left ventricle has normal function. The left ventricle has no regional wall motion abnormalities. Left ventricular diastolic function could not be evaluated.  2. Right ventricular systolic function is moderately reduced. The right ventricular size is mildly enlarged. There is severely elevated pulmonary artery systolic pressure. The estimated right ventricular systolic pressure is 62.5 mmHg.  3. Left atrial size was severely dilated.  4. Right atrial size was severely dilated.  5. The pericardial effusion is posterior to the left ventricle.    ventricular ejection fraction, by estimation, is 55 to 60%. The left ventricle has normal function. The left ventricle has no regional wall motion abnormalities. The left ventricular internal cavity size was normal in size. There is  no left ventricular hypertrophy. Left ventricular diastolic function could not be evaluated due to atrial fibrillation. Left ventricular diastolic function could not be evaluated. Right Ventricle: The right ventricular size is mildly enlarged. No increase in right ventricular wall thickness. Right ventricular systolic function is moderately reduced. There is severely elevated pulmonary artery systolic pressure. The tricuspid regurgitant velocity is 3.69 m/s, and with an assumed right atrial pressure of 8 mmHg, the estimated right ventricular systolic pressure is 123XX123 mmHg. Left Atrium: Left atrial size was severely dilated. Right  Atrium: Right atrial size was severely dilated. Pericardium: Trivial pericardial effusion is present. The pericardial effusion is posterior to the left ventricle. Mitral Valve: The mitral valve is normal in structure. Mild mitral valve regurgitation. No evidence of mitral valve stenosis. Tricuspid Valve: The tricuspid valve is normal in structure. Tricuspid valve regurgitation is mild to moderate. No evidence of tricuspid stenosis. Aortic Valve: The aortic valve is normal in structure. Aortic valve regurgitation is not visualized. No aortic stenosis is present. Pulmonic Valve: The pulmonic valve was normal in structure. Pulmonic valve regurgitation is trivial. No evidence of pulmonic stenosis. Aorta: The aortic root is normal in size and structure. Venous: The inferior vena cava is dilated in size with greater than 50% respiratory variability, suggesting right atrial pressure of 8 mmHg. IAS/Shunts: The interatrial septum appears to be lipomatous. No atrial level shunt detected by color flow Doppler.  LEFT VENTRICLE PLAX 2D LVIDd:         4.20 cm LVIDs:         3.30 cm LV PW:         1.10 cm LV IVS:        1.10 cm LVOT diam:     1.80 cm LV SV:         33 LV SV Index:   18 LVOT Area:     2.54 cm  RIGHT VENTRICLE          IVC RV Basal diam:  3.60 cm  IVC diam: 2.60 cm RV Mid diam:    2.60 cm TAPSE (M-mode): 1.3 cm LEFT ATRIUM             Index       RIGHT ATRIUM           Index LA diam:        5.70 cm 3.20 cm/m  RA Area:     30.60 cm LA Vol (A2C):   93.1 ml 52.27 ml/m RA Volume:   110.00 ml 61.76 ml/m LA Vol (A4C):   81.0 ml 45.48 ml/m LA Biplane Vol: 87.7 ml 49.24 ml/m  AORTIC VALVE LVOT Vmax:   69.45 cm/s LVOT Vmean:  45.100 cm/s LVOT VTI:    0.129 m  AORTA Ao Root diam: 3.00 cm Ao Asc diam:  3.00 cm MITRAL VALVE                TRICUSPID VALVE MV Area (PHT): 3.27 cm     TR Peak grad:   54.5 mmHg MV Decel Time: 232 msec     TR Vmax:        369.00 cm/s MV E velocity: 112.00 cm/s MV A velocity: 41.20 cm/s    SHUNTS MV E/A ratio:  2.72  Systemic VTI:  0.13 m                             Systemic Diam: 1.80 cm Fransico Him MD Electronically signed by Fransico Him MD Signature Date/Time: 05/31/2020/3:43:30 PM    Final    VAS US CAROTID  Result Date: 05/31/2020 Carotid Arterial Duplex Study Indications: CVA. Performing Technologist: Abram Sander RVS  Examination Guidelines: A complete evaluation includes B-mode imaging, spectral Doppler, color Doppler, and power Doppler as needed of all accessible portions of each vessel. Bilateral testing is considered an integral part of a complete examination. Limited examinations for reoccurring indications may be performed as noted.  Right Carotid Findings: +----------+--------+--------+--------+------------------+--------+           PSV cm/sEDV cm/sStenosisPlaque DescriptionComments +----------+--------+--------+--------+------------------+--------+ CCA Prox  41      9               heterogenous               +----------+--------+--------+--------+------------------+--------+ CCA Distal41      10              heterogenous               +----------+--------+--------+--------+------------------+--------+ ICA Prox  73      18      1-39%   heterogenous               +----------+--------+--------+--------+------------------+--------+ ICA Distal93      24                                         +----------+--------+--------+--------+------------------+--------+ ECA       90                                                 +----------+--------+--------+--------+------------------+--------+ +----------+--------+-------+--------+-------------------+           PSV cm/sEDV cmsDescribeArm Pressure (mmHG) +----------+--------+-------+--------+-------------------+ [id number]                                         +----------+--------+-------+--------+-------------------+ +---------+--------+--+--------+--+---------+ VertebralPSV  cm/s66EDV cm/s10Antegrade +---------+--------+--+--------+--+---------+  Left Carotid Findings: +----------+--------+--------+--------+------------------+--------+           PSV cm/sEDV cm/sStenosisPlaque DescriptionComments +----------+--------+--------+--------+------------------+--------+ CCA Prox  48      7               heterogenous               +----------+--------+--------+--------+------------------+--------+ CCA Distal33      5               heterogenous               +----------+--------+--------+--------+------------------+--------+ ICA Prox  102     22      1-39%   heterogenous               +----------+--------+--------+--------+------------------+--------+ ICA Distal62      19                                         +----------+--------+--------+--------+------------------+--------+  ECA       71      8                                          +----------+--------+--------+--------+------------------+--------+ +----------+--------+--------+--------+-------------------+           PSV cm/sEDV cm/sDescribeArm Pressure (mmHG) +----------+--------+--------+--------+-------------------+ [id number]                                          +----------+--------+--------+--------+-------------------+ +---------+--------+--+--------+--+---------+ VertebralPSV cm/s39EDV cm/s13Antegrade +---------+--------+--+--------+--+---------+   Summary: Right Carotid: Velocities in the right ICA are consistent with a 1-39% stenosis. Left Carotid: Velocities in the left ICA are consistent with a 1-39% stenosis. Vertebrals: Bilateral vertebral arteries demonstrate antegrade flow. *See table(s) above for measurements and observations.  Electronically signed by Jamelle Haring on 05/31/2020 at 4:30:40 PM.    Final       Assessment/Plan: Diagnosis: Left MCA CVA 1. Does the need for close, 24 hr/day medical supervision in concert with the patient's rehab  needs make it unreasonable for this patient to be served in a less intensive setting? Yes 2. Co-Morbidities requiring supervision/potential complications:  1. Atrial fibrillation: monitor HR TID 2. Recurrent symptomatic anemia: monitor Hgb 3. Hyperlipidemia 4. Iron deficiency anemia. 5. Orthostatic hypotension: monitor BP TID 3. Due to bladder management, bowel management, safety, skin/wound care, disease management, medication administration, pain management and patient education, does the patient require 24 hr/day rehab nursing? Yes 4. Does the patient require coordinated care of a physician, rehab nurse, therapy disciplines of PT, OT, SLP to address physical and functional deficits in the context of the above medical diagnosis(es)? Yes Addressing deficits in the following areas: balance, endurance, locomotion, strength, transferring, bowel/bladder control, bathing, dressing, feeding, grooming, toileting, speech and psychosocial support, language 5. Can the patient actively participate in an intensive therapy program of at least 3 hrs of therapy per day at least 5 days per week? Yes 6. The potential for patient to make measurable gains while on inpatient rehab is excellent 7. Anticipated functional outcomes upon discharge from inpatient rehab are supervision  with PT, supervision with OT, supervision with SLP. 8. Estimated rehab length of stay to reach the above functional goals is: 10-14 days 9. Anticipated discharge destination: Home 10. Overall Rehab/Functional Prognosis: excellent  RECOMMENDATIONS: This patient's condition is appropriate for continued rehabilitative care in the following setting: CIR Patient has agreed to participate in recommended program. Yes Note that insurance prior authorization may be required for reimbursement for recommended care.  Comment: Thank you for this consult. Admission coordinator to follow.   I have personally performed a face to face diagnostic  evaluation, including, but not limited to relevant history and physical exam findings, of this patient and developed relevant assessment and plan.  Additionally, I have reviewed and concur with the physician assistant's documentation above.  Leeroy Cha, MD  Lavon Paganini [place], PA-C 06/01/2020

## 2020-06-04 NOTE — NC FL2 (Signed)
[place] LEVEL OF CARE SCREENING TOOL     IDENTIFICATION  Patient Name: Katrina Randolph Birthdate: 1931/01/28 Sex: female Admission Date (Current Location): 05/30/2020  [facility] [place] Number:  Herbalist and Address:  [facility] [place], [place] [address], [place], [place] [address]      Provider Number: [id number]  Attending Physician Name and Address:  No att. providers found  Relative Name and Phone Number:  Laural Eiland, [phone]    Current Level of Care: Hospital Recommended Level of Care: [place] Prior Approval Number:    Date Approved/Denied:   PASRR Number: [id number]  Discharge Plan: SNF    Current Diagnoses: Patient Active Problem List   Diagnosis Date Noted  . CVA (cerebral vascular accident) ([place]) 05/30/2020  . Orthostatic hypotension 01/26/2020  . Essential hypertension 01/26/2020  . Acute respiratory failure ([place]) 10/19/2019  . Colon polyp 10/19/2019  . Orthostatic dizziness 10/19/2019  . Hypothyroidism 10/19/2019  . Rapid atrial fibrillation ([place]) 10/12/2019  . Pneumonia 10/12/2019  . Anemia requiring transfusions 10/12/2019  . DNR (do not resuscitate) 10/12/2019  . Acquired thrombophilia ([place]) 10/12/2019  . Iron deficiency anemia 11/24/2017  . Paroxysmal atrial fibrillation ([place]) 11/24/2017  . Chronic depression 11/24/2017  . GERD (gastroesophageal reflux disease) 11/24/2017  . Breast cancer ([place]) 11/24/2017  . Hypothyroid 11/24/2017  . Dyslipidemia 11/24/2017    Orientation RESPIRATION BLADDER Height & Weight     Self,Time,Situation,Place  Normal Continent Weight: 169 lb 5 oz (76.8 kg) Height:  5\' 2"  (157.5 cm) (pt stated)  BEHAVIORAL SYMPTOMS/MOOD NEUROLOGICAL BOWEL NUTRITION STATUS      Continent Diet (See DC summary)  AMBULATORY STATUS COMMUNICATION OF NEEDS Skin   Extensive Assist Verbally Skin abrasions (R leg and arm)                       Personal  Care Assistance Level of Assistance  Bathing,Feeding,Dressing Bathing Assistance: Limited assistance Feeding assistance: Limited assistance Dressing Assistance: Limited assistance     Functional Limitations Info  Sight,Hearing,Speech Sight Info: Impaired Hearing Info: Adequate Speech Info: Adequate    SPECIAL CARE FACTORS FREQUENCY  PT (By licensed PT),OT (By licensed OT)     PT Frequency: 5x week OT Frequency: 5x week            Contractures Contractures Info: Not present    Additional Factors Info  Code Status,Allergies,Psychotropic Code Status Info: Full Allergies Info: Sulfa Antibiotics Psychotropic Info: Sertraline (Zoloft)         Current Medications (06/04/2020):  This is the current hospital active medication list Current Facility-Administered Medications  Medication Dose Route Frequency Provider Last Rate Last Admin  . acetaminophen (TYLENOL) tablet 650 mg  650 mg Oral Q4H PRN Hugelmeyer, Alexis, DO   650 mg at 06/03/20 2122   Or  . acetaminophen (TYLENOL) 160 MG/5ML solution 650 mg  650 mg Per Tube Q4H PRN Hugelmeyer, Alexis, DO   650 mg at 06/03/20 6546   Or  . acetaminophen (TYLENOL) suppository 650 mg  650 mg Rectal Q4H PRN Hugelmeyer, Alexis, DO      . apixaban (ELIQUIS) tablet 5 mg  5 mg Oral BID Little Ishikawa, MD   5 mg at 06/04/20 0841  . atorvastatin (LIPITOR) tablet 20 mg  20 mg Oral Daily Rosalin Hawking, MD   20 mg at 06/04/20 0841  . levothyroxine (SYNTHROID) tablet 112 mcg  112 mcg Oral q morning Hugelmeyer, Alexis, DO  112 mcg at 06/04/20 0538  . metoprolol succinate (TOPROL-XL) 24 hr tablet 25 mg  25 mg Oral Daily Little Ishikawa, MD   25 mg at 06/04/20 0841  . pantoprazole (PROTONIX) EC tablet 40 mg  40 mg Oral Daily Hugelmeyer, Alexis, DO   40 mg at 06/04/20 0840  . senna-docusate (Senokot-S) tablet 1 tablet  1 tablet Oral QHS PRN Hugelmeyer, Alexis, DO      . sertraline (ZOLOFT) tablet 50 mg  50 mg Oral Daily Hugelmeyer, Alexis, DO    50 mg at 06/04/20 5409   Current Outpatient Medications  Medication Sig Dispense Refill  . esomeprazole (NEXIUM) 20 MG capsule Take 20 mg by mouth daily.    . furosemide (LASIX) 20 MG tablet Take 20 mg by mouth daily as needed for fluid.     Marland Kitchen levothyroxine (SYNTHROID) 112 MCG tablet Take 1 tablet (112 mcg total) by mouth every morning. 30 tablet 0  . sertraline (ZOLOFT) 50 MG tablet Take 50 mg by mouth daily.    Marland Kitchen apixaban (ELIQUIS) 5 MG TABS tablet Take 1 tablet (5 mg total) by mouth 2 (two) times daily. 60 tablet 0  . atorvastatin (LIPITOR) 10 MG tablet Take 2 tablets (20 mg total) by mouth daily. 60 tablet 0  . metoprolol succinate (TOPROL-XL) 25 MG 24 hr tablet Take 1 tablet (25 mg total) by mouth daily. Take with or immediately following a meal. 30 tablet 0     Discharge Medications: Please see discharge summary for a list of discharge medications.  Relevant Imaging Results:  Relevant Lab Results:   Additional Information SS# [id number]  [place], [place]

## 2020-06-04 NOTE — Plan of Care (Signed)
  Problem: Education: Goal: Knowledge of General Education information will improve Description: Including pain rating scale, medication(s)/side effects and non-pharmacologic comfort measures Outcome: Progressing   Problem: Clinical Measurements: Goal: Respiratory complications will improve Outcome: Progressing   Problem: Clinical Measurements: Goal: Cardiovascular complication will be avoided Outcome: Progressing   Problem: Safety: Goal: Ability to remain free from injury will improve Outcome: Progressing   

## 2020-06-04 NOTE — Discharge Summary (Signed)
Physician Discharge Summary  Katrina Randolph [id number] DOB: 07/16/1930 DOA: 05/30/2020  PCP: [name], PA-C  Admit date: 05/30/2020 Discharge date: 06/04/2020  Admitted From: Home Disposition: CIR  Recommendations for Outpatient Follow-up:  1. Follow up with PCP in 1-2 weeks 2. Please obtain BMP/CBC in one week 3. Please follow up neurology as scheduled:  Discharge Condition: Stable CODE STATUS: Full Diet recommendation: As tolerated  Brief/Interim Summary: Katrina Randolph a 87 y.o.femalewith a known history of atrial fibrillation not on anticoagulation, hypothyroidismpresents to the emergency department for evaluation of altered mental status. Patient was in a usual state of health until 18 hours prior to arrival she was last seen normal. She states that she lost her balance and fell. She subsequently reported right upper extremity and right lower extremity weakness associated with difficulty with word finding, mild confusion. Events were unwitnessed. Patient called her daughter.She lives alone, normally functionally independent, ambulates without assistance. At present patient reports difficulty with word finding, right sided weakness and decreased sensation on the right. Otherwise there has been no change in status. Patient has been taking medication as prescribed and there has been no recent change in medication or diet. No recent antibiotics. There has been no recent illness, hospitalizations, travel or sick contacts.  Patient admitted as above, found to have acute left posterior MCA stroke, neurology was consulted at intake, lengthy discussion with family about need for full dose anticoagulation in the setting of A. fib, there was questionable history of bleeding however on further exam with family this appears to have been a transient event post procedure and she has not had any personal history of profound GI bleeding or hospitalization for bleeding.  Patient  otherwise stable and agreeable for discharge to CIR, close follow-up with cardiology, neurology and PCP in the next few weeks as scheduled.  Patient had transient episode of tachycardia with afib during exertion and was requested to stay on medical floor overnight.  Discharge Diagnoses:  Active Problems:   CVA (cerebral vascular accident) [facility])    Discharge Instructions  Discharge Instructions    Diet - low sodium heart healthy   Complete by: As directed    Increase activity slowly   Complete by: As directed      Allergies as of 06/04/2020      Reactions   Sulfa Antibiotics    "feels bad"      Medication List    STOP taking these medications   traZODone 50 MG tablet Commonly known as: DESYREL     TAKE these medications   apixaban 5 MG Tabs tablet Commonly known as: ELIQUIS Take 1 tablet (5 mg total) by mouth 2 (two) times daily.   atorvastatin 10 MG tablet Commonly known as: LIPITOR Take 2 tablets (20 mg total) by mouth daily.   esomeprazole 20 MG capsule Commonly known as: NEXIUM Take 20 mg by mouth daily.   furosemide 20 MG tablet Commonly known as: LASIX Take 20 mg by mouth daily as needed for fluid.   levothyroxine 112 MCG tablet Commonly known as: SYNTHROID Take 1 tablet (112 mcg total) by mouth every morning.   metoprolol succinate 25 MG 24 hr tablet Commonly known as: TOPROL-XL Take 1 tablet (25 mg total) by mouth daily. Take with or immediately following a meal. What changed:   medication strength  how much to take   sertraline 50 MG tablet Commonly known as: ZOLOFT Take 50 mg by mouth daily.       Allergies  Allergen Reactions  .  Sulfa Antibiotics     "feels bad"    Consultations: Neuro, cardiology  Procedures/Studies: DG Chest 2 View  Result Date: 05/31/2020 CLINICAL DATA:  TIA EXAM: CHEST - 2 VIEW COMPARISON:  Radiograph 10/12/2018 FINDINGS: Diffuse pulmonary vascular congestion with hazy and streaky opacities in the mid to  lower lungs and more patchy, coalescent opacity in the retrocardiac space. No pneumothorax or visible effusion. Biapical pleuroparenchymal scarring. Stable cardiomegaly with a calcified, tortuous aorta. The osseous structures appear diffusely demineralized which may limit detection of small or nondisplaced fractures. No acute osseous or soft tissue abnormality. Degenerative changes are present in the imaged spine and shoulders. Telemetry leads overlie the chest. IMPRESSION: Diffuse pulmonary vascular congestion with hazy and streaky opacities in the mid to lower lungs and more patchy, coalescent opacity in the retrocardiac space. Findings may reflect some developing edema and/or atelectasis. Infection less favored though not fully excluded in the appropriate clinical context. Electronically Signed   By: Lovena Le M.D.   On: 05/31/2020 00:11   CT Head Wo Contrast  Result Date: 05/30/2020 CLINICAL DATA:  Altered mental status, last known normal 12 hours ago EXAM: CT HEAD WITHOUT CONTRAST TECHNIQUE: Contiguous axial images were obtained from the base of the skull through the vertex without intravenous contrast. COMPARISON:  None. FINDINGS: Brain: No evidence of acute large vascular territory infarction, hemorrhage, hydrocephalus, extra-axial collection or mass lesion/mass effect. Age related global parenchymal volume loss with ex vacuo dilatation of ventricular system and prominent extra-axial spaces. There are patchy subcortical and periventricular white matter hypodensities which are nonspecific but favored represent sequela of chronic ischemic small vessel white matter disease. Vascular: No hyperdense vessel. Atherosclerotic calcifications of the intracranial portions of the internal carotid and vertebral arteries. Skull: Hyperostosis frontalis. Negative for fracture or focal lesion. Sinuses/Orbits: The paranasal sinuses and mastoid air cells are predominantly clear. Pneumatized petrous apices. Orbits are  grossly unremarkable. Other: None IMPRESSION: 1. No acute intracranial findings. 2. Age related global parenchymal volume loss and chronic ischemic small vessel white matter disease. Electronically Signed   By: Dahlia Bailiff MD   On: 05/30/2020 14:34   MR ANGIO HEAD WO CONTRAST  Result Date: 05/30/2020 CLINICAL DATA:  Neuro deficit, acute stroke suspected. EXAM: MRI HEAD WITHOUT CONTRAST MRA HEAD WITHOUT CONTRAST TECHNIQUE: Multiplanar, multiecho pulse sequences of the brain and surrounding structures were obtained without intravenous contrast. Angiographic images of the head were obtained using MRA technique without contrast. COMPARISON:  None. FINDINGS: MRI HEAD FINDINGS Brain: Multiple acute infarcts in the left MCA territory, including acute infarct of the posterior left insula, left periventricular frontal lobe and left frontoparietal cortex superiorly. There is T2/FLAIR hyperintense mild edema associated with these infarcts. No substantial mass effect. No midline shift. Basal cisterns are patent. No evidence of acute hemorrhage. No extra-axial fluid collections. Additional moderate T2/FLAIR hyperintensities within the white matter and pons, nonspecific but most likely related to chronic microvascular ischemic disease. Frontal predominant cerebral volume loss with associated prominence of the overlying extra-axial spaces. Vascular: Major arterial flow voids are maintained at the skull base. Skull and upper cervical spine: Normal marrow signal. Sinuses/Orbits: Clear sinuses.  Unremarkable orbits. Other: No sizable mastoid effusions. MRA HEAD FINDINGS Anterior circulation: Bilateral ICAs are patent through the carotid terminus. Bilateral M1 MCAs are patent. Left proximal M2 MCA inferior division is poorly visualized, but within an area of artifact. There is occlusion of two other proximal left M2 MCA branches in the left sylvian fissure (see series 24, images 41 and  51). Additionally, there is suspected  occlusion of a more distal left M2/M3 MCA branch (series 24, image 30). Smaller left A1 ACA, likely congenital. Otherwise, patent ACAs. Possible small (approximately 2 mm) outpouching in the region of the right ICA terminus. Posterior circulation: Patent intradural vertebral arteries and basilar artery. Right fetal type PCA. Patent proximal posterior cerebral arteries with suspected mild stenosis of the right P2 PCA. IMPRESSION: 1. Left posterior MCA territory acute infarcts with occlusion of multiple left M2 and M2/M3 MCA branches, as described above. Associated edema without substantial mass effect. 2. Moderate chronic microvascular ischemic disease. 3. Frontal predominant cerebral volume loss. 4. Possible small (approximately 2 mm) aneurysm in the region of the right ICA terminus. A CTA could further characterize if clinically indicated Critical/urgent findings discussed with Dr. Rory Percy at 6:14 p.m via telephone. Electronically Signed   By: Margaretha Sheffield MD   On: 05/30/2020 18:43   MR BRAIN WO CONTRAST  Result Date: 05/30/2020 CLINICAL DATA:  Neuro deficit, acute stroke suspected. EXAM: MRI HEAD WITHOUT CONTRAST MRA HEAD WITHOUT CONTRAST TECHNIQUE: Multiplanar, multiecho pulse sequences of the brain and surrounding structures were obtained without intravenous contrast. Angiographic images of the head were obtained using MRA technique without contrast. COMPARISON:  None. FINDINGS: MRI HEAD FINDINGS Brain: Multiple acute infarcts in the left MCA territory, including acute infarct of the posterior left insula, left periventricular frontal lobe and left frontoparietal cortex superiorly. There is T2/FLAIR hyperintense mild edema associated with these infarcts. No substantial mass effect. No midline shift. Basal cisterns are patent. No evidence of acute hemorrhage. No extra-axial fluid collections. Additional moderate T2/FLAIR hyperintensities within the white matter and pons, nonspecific but most likely related  to chronic microvascular ischemic disease. Frontal predominant cerebral volume loss with associated prominence of the overlying extra-axial spaces. Vascular: Major arterial flow voids are maintained at the skull base. Skull and upper cervical spine: Normal marrow signal. Sinuses/Orbits: Clear sinuses.  Unremarkable orbits. Other: No sizable mastoid effusions. MRA HEAD FINDINGS Anterior circulation: Bilateral ICAs are patent through the carotid terminus. Bilateral M1 MCAs are patent. Left proximal M2 MCA inferior division is poorly visualized, but within an area of artifact. There is occlusion of two other proximal left M2 MCA branches in the left sylvian fissure (see series 24, images 41 and 45). Additionally, there is suspected occlusion of a more distal left M2/M3 MCA branch (series 24, image 30). Smaller left A1 ACA, likely congenital. Otherwise, patent ACAs. Possible small (approximately 2 mm) outpouching in the region of the right ICA terminus. Posterior circulation: Patent intradural vertebral arteries and basilar artery. Right fetal type PCA. Patent proximal posterior cerebral arteries with suspected mild stenosis of the right P2 PCA. IMPRESSION: 1. Left posterior MCA territory acute infarcts with occlusion of multiple left M2 and M2/M3 MCA branches, as described above. Associated edema without substantial mass effect. 2. Moderate chronic microvascular ischemic disease. 3. Frontal predominant cerebral volume loss. 4. Possible small (approximately 2 mm) aneurysm in the region of the right ICA terminus. A CTA could further characterize if clinically indicated Critical/urgent findings discussed with Dr. Rory Percy at 6:14 p.m via telephone. Electronically Signed   By: Margaretha Sheffield MD   On: 05/30/2020 18:43   DG Swallowing Func-Speech Pathology  Result Date: 06/01/2020 Objective Swallowing Evaluation: Type of Study: MBS-Modified Barium Swallow Study  Patient Details Name: Katrina Randolph MRN: [id number]  Date of Birth: 23-Jun-1930 Today's Date: 06/01/2020 Time: SLP Start Time (ACUTE ONLY): 1003 -SLP Stop Time (ACUTE ONLY): 1017 SLP Time  Calculation (min) (ACUTE ONLY): 14 min Past Medical History: Past Medical History: Diagnosis Date . Atrial fibrillation [facility])  Past Surgical History: Past Surgical History: Procedure Laterality Date . ABDOMINAL HYSTERECTOMY   . BIOPSY  10/19/2019  Procedure: BIOPSY;  Surgeon: [name], MD;  Location: [facility];  Service: Gastroenterology;; . BREAST LUMPECTOMY   . CHOLECYSTECTOMY   . COLONOSCOPY WITH PROPOFOL N/A 10/19/2019  Procedure: COLONOSCOPY WITH PROPOFOL;  Surgeon: [name], MD;  Location: [facility];  Service: Gastroenterology;  Laterality: N/A; . ESOPHAGOGASTRODUODENOSCOPY (EGD) WITH PROPOFOL N/A 10/19/2019  Procedure: ESOPHAGOGASTRODUODENOSCOPY (EGD) WITH PROPOFOL;  Surgeon: [name], MD;  Location: [facility];  Service: Gastroenterology;  Laterality: N/A; . POLYPECTOMY  10/19/2019  Procedure: POLYPECTOMY;  Surgeon: [name], MD;  Location: [facility];  Service: Gastroenterology;; HPI: Pt is an 87 y.o. female with a PMHx of permanent atrial fibrillation with RVT not on AC, HTN, HLD not on statin, orthostatic hypotension, symptomatic recurrent anemia, GERD, depression, breast cancer, hypothyroidism,  who presented to the [facility] ED with a chief complaint of inability to communicate effectively and decreased sensation on her right side. Daughter also noted some difficulty walking and right sided weakness. She was out of the time window for tPA at the time of arrival to the ED. MRI brain revealed left posterior MCA territory acute infarctions with occlusion of multiple left M2 and M2/M3 branches.  Chest CT (05/30/20) revealed "Diffuse pulmonary vascular congestion with hazy and streaky opacities in the mid to lower lungs and more patchy, coalescent opacity in the retrocardiac space. Findings may reflect some developing edema and/or atelectasis.  Infection less favored though not fully excluded in the appropriate clinical context. Per NP note (05/31/20), "passed nursing bedside swallow but coughing during my visit today with liquids". BSE requested.  No data recorded Assessment / Plan / Recommendation CHL IP CLINICAL IMPRESSIONS 06/01/2020 Clinical Impression Pt presents with normal oropharyngeal swallow. With all solid and liquid pt demonstrated adequate oral manipulation, cohesion and timely swallow trigger with strong pharyngeal peristalsis and laryngeal closure which was evident with absence of penetration/aspiration and normal amounts of residuals in vallecula and pyriform sinuses. It should be noted, however, that pt exhibited initial difficulty obtaining anterior- posterior oral transit of whole pill with thin liquids and eventually lodged in vallecular space briefly before fully propelled into cervical esophagus with help of puree wash. Cough x1 noted after swallowing whole pill, but no evidence of airway compromise with fluoro turned on. Recommend pt continue on regular, thin liquid diet with meds whole (puree wash as needed). SLP to f/u for tolerance and education. SLP Visit Diagnosis Dysphagia, unspecified (R13.10) Attention and concentration deficit following -- Frontal lobe and executive function deficit following -- Impact on safety and function No limitations   CHL IP TREATMENT RECOMMENDATION 06/01/2020 Treatment Recommendations Therapy as outlined in treatment plan below   Prognosis 06/01/2020 Prognosis for Safe Diet Advancement Good Barriers to Reach Goals Language deficits Barriers/Prognosis Comment -- CHL IP DIET RECOMMENDATION 06/01/2020 SLP Diet Recommendations Regular solids;Thin liquid Liquid Administration via Straw;Cup Medication Administration Whole meds with liquid Compensations Slow rate;Small sips/bites Postural Changes Seated upright at 90 degrees   CHL IP OTHER RECOMMENDATIONS 06/01/2020 Recommended Consults -- Oral Care  Recommendations Oral care BID Other Recommendations --   CHL IP FOLLOW UP RECOMMENDATIONS 06/01/2020 Follow up Recommendations Inpatient Rehab   CHL IP FREQUENCY AND DURATION 06/01/2020 Speech Therapy Frequency (ACUTE ONLY) min 2x/week Treatment Duration 2 weeks      CHL IP ORAL PHASE 06/01/2020 Oral  Phase WFL Oral - Pudding Teaspoon -- Oral - Pudding Cup -- Oral - Honey Teaspoon -- Oral - Honey Cup -- Oral - Nectar Teaspoon -- Oral - Nectar Cup -- Oral - Nectar Straw -- Oral - Thin Teaspoon -- Oral - Thin Cup -- Oral - Thin Straw -- Oral - Puree -- Oral - Mech Soft -- Oral - Regular -- Oral - Multi-Consistency -- Oral - Pill -- Oral Phase - Comment --  CHL IP PHARYNGEAL PHASE 06/01/2020 Pharyngeal Phase WFL Pharyngeal- Pudding Teaspoon -- Pharyngeal -- Pharyngeal- Pudding Cup -- Pharyngeal -- Pharyngeal- Honey Teaspoon -- Pharyngeal -- Pharyngeal- Honey Cup -- Pharyngeal -- Pharyngeal- Nectar Teaspoon -- Pharyngeal -- Pharyngeal- Nectar Cup -- Pharyngeal -- Pharyngeal- Nectar Straw -- Pharyngeal -- Pharyngeal- Thin Teaspoon -- Pharyngeal -- Pharyngeal- Thin Cup -- Pharyngeal -- Pharyngeal- Thin Straw -- Pharyngeal -- Pharyngeal- Puree -- Pharyngeal -- Pharyngeal- Mechanical Soft -- Pharyngeal -- Pharyngeal- Regular -- Pharyngeal -- Pharyngeal- Multi-consistency -- Pharyngeal -- Pharyngeal- Pill -- Pharyngeal -- Pharyngeal Comment --  CHL IP CERVICAL ESOPHAGEAL PHASE 06/01/2020 Cervical Esophageal Phase WFL Pudding Teaspoon -- Pudding Cup -- Honey Teaspoon -- Honey Cup -- Nectar Teaspoon -- Nectar Cup -- Nectar Straw -- Thin Teaspoon -- Thin Cup -- Thin Straw -- Puree -- Mechanical Soft -- Regular -- Multi-consistency -- Pill -- Cervical Esophageal Comment -- Ellwood Dense, MA, CCC-SLP Acute Rehabilitation Services Office Number: [phone] Acie Fredrickson 06/01/2020, 11:39 AM              ECHOCARDIOGRAM COMPLETE  Result Date: 05/31/2020    ECHOCARDIOGRAM REPORT   Patient Name:   Katrina Randolph Date of  Exam: 05/31/2020 Medical Rec #:  [id number]             Height:       62.0 in Accession #:    [id number]            Weight:       169.3 lb Date of Birth:  1931/01/20             BSA:          1.781 m Patient Age:    89 years              BP:           129/77 mmHg Patient Gender: F                     HR:           103 bpm. Exam Location:  Inpatient Procedure: 2D Echo, Cardiac Doppler and Color Doppler Indications:    Stroke I63.9  History:        Patient has prior history of Echocardiogram examinations, most                 recent 10/13/2019. Arrythmias:Atrial Fibrillation.  Sonographer:    Tiffany Dance Referring Phys: [id number] [place]  1. Left ventricular ejection fraction, by estimation, is 55 to 60%. The left ventricle has normal function. The left ventricle has no regional wall motion abnormalities. Left ventricular diastolic function could not be evaluated.  2. Right ventricular systolic function is moderately reduced. The right ventricular size is mildly enlarged. There is severely elevated pulmonary artery systolic pressure. The estimated right ventricular systolic pressure is 41.5 mmHg.  3. Left atrial size was severely dilated.  4. Right atrial size was severely dilated.  5. The pericardial effusion is posterior to the left ventricle.  6. The mitral valve is normal in structure. Mild mitral valve regurgitation. No evidence of mitral stenosis.  7. Tricuspid valve regurgitation is mild to moderate.  8. The aortic valve is normal in structure. Aortic valve regurgitation is not visualized. No aortic stenosis is present.  9. The inferior vena cava is dilated in size with >50% respiratory variability, suggesting right atrial pressure of 8 mmHg. FINDINGS  Left Ventricle: Left ventricular ejection fraction, by estimation, is 55 to 60%. The left ventricle has normal function. The left ventricle has no regional wall motion abnormalities. The left ventricular internal cavity size was normal in size.  There is  no left ventricular hypertrophy. Left ventricular diastolic function could not be evaluated due to atrial fibrillation. Left ventricular diastolic function could not be evaluated. Right Ventricle: The right ventricular size is mildly enlarged. No increase in right ventricular wall thickness. Right ventricular systolic function is moderately reduced. There is severely elevated pulmonary artery systolic pressure. The tricuspid regurgitant velocity is 3.69 m/s, and with an assumed right atrial pressure of 8 mmHg, the estimated right ventricular systolic pressure is 02.5 mmHg. Left Atrium: Left atrial size was severely dilated. Right Atrium: Right atrial size was severely dilated. Pericardium: Trivial pericardial effusion is present. The pericardial effusion is posterior to the left ventricle. Mitral Valve: The mitral valve is normal in structure. Mild mitral valve regurgitation. No evidence of mitral valve stenosis. Tricuspid Valve: The tricuspid valve is normal in structure. Tricuspid valve regurgitation is mild to moderate. No evidence of tricuspid stenosis. Aortic Valve: The aortic valve is normal in structure. Aortic valve regurgitation is not visualized. No aortic stenosis is present. Pulmonic Valve: The pulmonic valve was normal in structure. Pulmonic valve regurgitation is trivial. No evidence of pulmonic stenosis. Aorta: The aortic root is normal in size and structure. Venous: The inferior vena cava is dilated in size with greater than 50% respiratory variability, suggesting right atrial pressure of 8 mmHg. IAS/Shunts: The interatrial septum appears to be lipomatous. No atrial level shunt detected by color flow Doppler.  LEFT VENTRICLE PLAX 2D LVIDd:         4.20 cm LVIDs:         3.30 cm LV PW:         1.10 cm LV IVS:        1.10 cm LVOT diam:     1.80 cm LV SV:         33 LV SV Index:   18 LVOT Area:     2.54 cm  RIGHT VENTRICLE          IVC RV Basal diam:  3.60 cm  IVC diam: 2.60 cm RV Mid diam:     2.60 cm TAPSE (M-mode): 1.3 cm LEFT ATRIUM             Index       RIGHT ATRIUM           Index LA diam:        5.70 cm 3.20 cm/m  RA Area:     30.60 cm LA Vol (A2C):   93.1 ml 52.27 ml/m RA Volume:   110.00 ml 61.76 ml/m LA Vol (A4C):   81.0 ml 45.48 ml/m LA Biplane Vol: 87.7 ml 49.24 ml/m  AORTIC VALVE LVOT Vmax:   69.45 cm/s LVOT Vmean:  45.100 cm/s LVOT VTI:    0.129 m  AORTA Ao Root diam: 3.00 cm Ao Asc diam:  3.00 cm MITRAL VALVE  TRICUSPID VALVE MV Area (PHT): 3.27 cm     TR Peak grad:   54.5 mmHg MV Decel Time: 232 msec     TR Vmax:        369.00 cm/s MV E velocity: 112.00 cm/s MV A velocity: 41.20 cm/s   SHUNTS MV E/A ratio:  2.72         Systemic VTI:  0.13 m                             Systemic Diam: 1.80 cm Fransico Him MD Electronically signed by Fransico Him MD Signature Date/Time: 05/31/2020/3:43:30 PM    Final    VAS US CAROTID  Result Date: 05/31/2020 Carotid Arterial Duplex Study Indications: CVA. Performing Technologist: Abram Sander RVS  Examination Guidelines: A complete evaluation includes B-mode imaging, spectral Doppler, color Doppler, and power Doppler as needed of all accessible portions of each vessel. Bilateral testing is considered an integral part of a complete examination. Limited examinations for reoccurring indications may be performed as noted.  Right Carotid Findings: +----------+--------+--------+--------+------------------+--------+           PSV cm/sEDV cm/sStenosisPlaque DescriptionComments +----------+--------+--------+--------+------------------+--------+ CCA Prox  41      9               heterogenous               +----------+--------+--------+--------+------------------+--------+ CCA Distal41      10              heterogenous               +----------+--------+--------+--------+------------------+--------+ ICA Prox  73      18      1-39%   heterogenous                +----------+--------+--------+--------+------------------+--------+ ICA Distal93      24                                         +----------+--------+--------+--------+------------------+--------+ ECA       90                                                 +----------+--------+--------+--------+------------------+--------+ +----------+--------+-------+--------+-------------------+           PSV cm/sEDV cmsDescribeArm Pressure (mmHG) +----------+--------+-------+--------+-------------------+ [id number]                                         +----------+--------+-------+--------+-------------------+ +---------+--------+--+--------+--+---------+ VertebralPSV cm/s66EDV cm/s10Antegrade +---------+--------+--+--------+--+---------+  Left Carotid Findings: +----------+--------+--------+--------+------------------+--------+           PSV cm/sEDV cm/sStenosisPlaque DescriptionComments +----------+--------+--------+--------+------------------+--------+ CCA Prox  48      7               heterogenous               +----------+--------+--------+--------+------------------+--------+ CCA Distal33      5               heterogenous               +----------+--------+--------+--------+------------------+--------+ ICA Prox  102     22      1-39%  heterogenous               +----------+--------+--------+--------+------------------+--------+ ICA Distal62      19                                         +----------+--------+--------+--------+------------------+--------+ ECA       71      8                                          +----------+--------+--------+--------+------------------+--------+ +----------+--------+--------+--------+-------------------+           PSV cm/sEDV cm/sDescribeArm Pressure (mmHG) +----------+--------+--------+--------+-------------------+ [id number]                                           +----------+--------+--------+--------+-------------------+ +---------+--------+--+--------+--+---------+ VertebralPSV cm/s39EDV cm/s13Antegrade +---------+--------+--+--------+--+---------+   Summary: Right Carotid: Velocities in the right ICA are consistent with a 1-39% stenosis. Left Carotid: Velocities in the left ICA are consistent with a 1-39% stenosis. Vertebrals: Bilateral vertebral arteries demonstrate antegrade flow. *See table(s) above for measurements and observations.  Electronically signed by Jamelle Haring on 05/31/2020 at 4:30:40 PM.    Final      Subjective: No acute issues or events overnight   Discharge Exam: Vitals:   06/04/20 0504 06/04/20 0535  BP: (!) 156/108 (!) 157/67  Pulse: (!) 112 99  Resp: 18 18  Temp: 97.6 F (36.4 C) 97.6 F (36.4 C)  SpO2: 95% 94%   Vitals:   06/03/20 1659 06/03/20 2107 06/04/20 0504 06/04/20 0535  BP: (!) 150/92 (!) 151/109 (!) 156/108 (!) 157/67  Pulse: (!) 107 (!) 108 (!) 112 99  Resp: 20 20 18 18   Temp: 98 F (36.7 C) 98.1 F (36.7 C) 97.6 F (36.4 C) 97.6 F (36.4 C)  TempSrc:  Oral Oral Oral  SpO2: 100% 96% 95% 94%  Weight:      Height:        General: Pt is alert, awake, not in acute distress Cardiovascular: RRR, S1/S2 +, no rubs, no gallops Respiratory: CTA bilaterally, no wheezing, no rhonchi Abdominal: Soft, NT, ND, bowel sounds + Extremities: no edema, no cyanosis    The results of significant diagnostics from this hospitalization (including imaging, microbiology, ancillary and laboratory) are listed below for reference.     Microbiology: Recent Results (from the past 240 hour(s))  Resp Panel by RT-PCR (Flu A&B, Covid) Nasopharyngeal Swab     Status: None   Collection Time: 05/30/20  3:29 PM   Specimen: Nasopharyngeal Swab; Nasopharyngeal(NP) swabs in vial transport medium  Result Value Ref Range Status   SARS Coronavirus 2 by RT PCR NEGATIVE NEGATIVE Final    Comment: (NOTE) SARS-CoV-2 target  nucleic acids are NOT DETECTED.  The SARS-CoV-2 RNA is generally detectable in upper respiratory specimens during the acute phase of infection. The lowest concentration of SARS-CoV-2 viral copies this assay can detect is 138 copies/mL. A negative result does not preclude SARS-Cov-2 infection and should not be used as the sole basis for treatment or other patient management decisions. A negative result may occur with  improper specimen collection/handling, submission of specimen other than nasopharyngeal swab, presence of viral mutation(s) within the areas targeted by this assay, and inadequate  number of viral copies(<138 copies/mL). A negative result must be combined with clinical observations, patient history, and epidemiological information. The expected result is Negative.  Fact Sheet for Patients:  EntrepreneurPulse.com.au  Fact Sheet for Healthcare Providers:  IncredibleEmployment.be  This test is no t yet approved or cleared by the [place] FDA and  has been authorized for detection and/or diagnosis of SARS-CoV-2 by FDA under an Emergency Use Authorization (EUA). This EUA will remain  in effect (meaning this test can be used) for the duration of the COVID-19 declaration under Section 564(b)(1) of the Act, 21 U.S.C.section 360bbb-3(b)(1), unless the authorization is terminated  or revoked sooner.       Influenza A by PCR NEGATIVE NEGATIVE Final   Influenza B by PCR NEGATIVE NEGATIVE Final    Comment: (NOTE) The Xpert Xpress SARS-CoV-2/FLU/RSV plus assay is intended as an aid in the diagnosis of influenza from Nasopharyngeal swab specimens and should not be used as a sole basis for treatment. Nasal washings and aspirates are unacceptable for Xpert Xpress SARS-CoV-2/FLU/RSV testing.  Fact Sheet for Patients: EntrepreneurPulse.com.au  Fact Sheet for Healthcare  Providers: IncredibleEmployment.be  This test is not yet approved or cleared by the [place] FDA and has been authorized for detection and/or diagnosis of SARS-CoV-2 by FDA under an Emergency Use Authorization (EUA). This EUA will remain in effect (meaning this test can be used) for the duration of the COVID-19 declaration under Section 564(b)(1) of the Act, 21 U.S.C. section 360bbb-3(b)(1), unless the authorization is terminated or revoked.  Performed at [facility], [place] [address], [place], [place] [address]      Labs: BNP (last 3 results) Recent Labs    10/12/19 1427  BNP 0000000*   Basic Metabolic Panel: Recent Labs  Lab 05/30/20 1338 05/31/20 0022 06/01/20 0112  NA 139  --  140  K 3.4*  --  3.8  CL 106  --  109  CO2 23  --  23  GLUCOSE 118*  --  115*  BUN 16  --  11  CREATININE 0.91 0.82 1.02*  CALCIUM 8.8*  --  8.7*   Liver Function Tests: Recent Labs  Lab 05/30/20 1338  AST 16  ALT 13  ALKPHOS 60  BILITOT 1.7*  PROT 7.3  ALBUMIN 4.0   No results for input(s): LIPASE, AMYLASE in the last 168 hours. No results for input(s): AMMONIA in the last 168 hours. CBC: Recent Labs  Lab 05/30/20 1338 05/31/20 0022 06/01/20 0112 06/02/20 0059 06/03/20 0033 06/04/20 0134  WBC 8.1 8.6 6.9 6.8 8.9 7.9  NEUTROABS 6.6  --   --   --   --   --   HGB 12.3 12.2 11.5* 11.8* 12.4 12.1  HCT 40.7 39.5 36.7 38.4 40.8 39.1  MCV 90.0 88.4 87.6 88.3 88.5 87.9  PLT 196 137* 175 174 204 167   Cardiac Enzymes: No results for input(s): CKTOTAL, CKMB, CKMBINDEX, TROPONINI in the last 168 hours. BNP: Invalid input(s): POCBNP CBG: No results for input(s): GLUCAP in the last 168 hours. D-Dimer No results for input(s): DDIMER in the last 72 hours. Hgb A1c No results for input(s): HGBA1C in the last 72 hours. Lipid Profile No results for input(s): CHOL, HDL, LDLCALC, TRIG, CHOLHDL, LDLDIRECT in the last 72 hours. Thyroid function  studies No results for input(s): TSH, T4TOTAL, T3FREE, THYROIDAB in the last 72 hours.  Invalid input(s): FREET3 Anemia work up No results for input(s): VITAMINB12, FOLATE, FERRITIN, TIBC, IRON, RETICCTPCT in the  last 72 hours. Urinalysis    Component Value Date/Time   COLORURINE AMBER (A) 05/30/2020 1332   APPEARANCEUR CLEAR 05/30/2020 1332   LABSPEC 1.023 05/30/2020 1332   PHURINE 5.0 05/30/2020 1332   GLUCOSEU NEGATIVE 05/30/2020 1332   HGBUR SMALL (A) 05/30/2020 1332   BILIRUBINUR NEGATIVE 05/30/2020 1332   KETONESUR 5 (A) 05/30/2020 1332   PROTEINUR 30 (A) 05/30/2020 1332   NITRITE NEGATIVE 05/30/2020 1332   LEUKOCYTESUR NEGATIVE 05/30/2020 1332   Sepsis Labs Invalid input(s): PROCALCITONIN,  WBC,  LACTICIDVEN Microbiology Recent Results (from the past 240 hour(s))  Resp Panel by RT-PCR (Flu A&B, Covid) Nasopharyngeal Swab     Status: None   Collection Time: 05/30/20  3:29 PM   Specimen: Nasopharyngeal Swab; Nasopharyngeal(NP) swabs in vial transport medium  Result Value Ref Range Status   SARS Coronavirus 2 by RT PCR NEGATIVE NEGATIVE Final    Comment: (NOTE) SARS-CoV-2 target nucleic acids are NOT DETECTED.  The SARS-CoV-2 RNA is generally detectable in upper respiratory specimens during the acute phase of infection. The lowest concentration of SARS-CoV-2 viral copies this assay can detect is 138 copies/mL. A negative result does not preclude SARS-Cov-2 infection and should not be used as the sole basis for treatment or other patient management decisions. A negative result may occur with  improper specimen collection/handling, submission of specimen other than nasopharyngeal swab, presence of viral mutation(s) within the areas targeted by this assay, and inadequate number of viral copies(<138 copies/mL). A negative result must be combined with clinical observations, patient history, and epidemiological information. The expected result is Negative.  Fact Sheet for  Patients:  EntrepreneurPulse.com.au  Fact Sheet for Healthcare Providers:  IncredibleEmployment.be  This test is no t yet approved or cleared by the [place] FDA and  has been authorized for detection and/or diagnosis of SARS-CoV-2 by FDA under an Emergency Use Authorization (EUA). This EUA will remain  in effect (meaning this test can be used) for the duration of the COVID-19 declaration under Section 564(b)(1) of the Act, 21 U.S.C.section 360bbb-3(b)(1), unless the authorization is terminated  or revoked sooner.       Influenza A by PCR NEGATIVE NEGATIVE Final   Influenza B by PCR NEGATIVE NEGATIVE Final    Comment: (NOTE) The Xpert Xpress SARS-CoV-2/FLU/RSV plus assay is intended as an aid in the diagnosis of influenza from Nasopharyngeal swab specimens and should not be used as a sole basis for treatment. Nasal washings and aspirates are unacceptable for Xpert Xpress SARS-CoV-2/FLU/RSV testing.  Fact Sheet for Patients: EntrepreneurPulse.com.au  Fact Sheet for Healthcare Providers: IncredibleEmployment.be  This test is not yet approved or cleared by the [place] FDA and has been authorized for detection and/or diagnosis of SARS-CoV-2 by FDA under an Emergency Use Authorization (EUA). This EUA will remain in effect (meaning this test can be used) for the duration of the COVID-19 declaration under Section 564(b)(1) of the Act, 21 U.S.C. section 360bbb-3(b)(1), unless the authorization is terminated or revoked.  Performed at [facility], [place] [address] [place], [place] [address]      Time coordinating discharge: Over 30 minutes  SIGNED:   Little Ishikawa, DO [facility] 06/04/2020, 7:30 AM Pager   If 7PM-7AM, please contact night-coverage [email or web address]

## 2020-06-04 NOTE — Progress Notes (Signed)
PMR Admission Coordinator Pre-Admission Assessment  Patient: Katrina Randolph is an 87 y.o., female MRN: [id number] DOB: 02-23-1930 Height: '5\' 2"'  (157.5 cm) (pt stated) Weight: 76.8 kg                                                                                                                                                  Insurance Information HMO:      PPO: Yes     PCP:       IPA:       80/20:       OTHER:  Group [id number] PRIMARY: UHC Medicare      Policy#: [id number]      Subscriber: patient CM Name: n/a      Phone#: [phone]     Fax#: [phone] [place] with [employer] called 06/02/20  Gave approval for 7  days,  4/22-4/28  fax clinicals to [phone]  Pre-Cert#: [id number]      Employer: Retired Benefits:  Phone #: [phone]     Name: online [email or web address] Eff. Date: 02/12/20     Deduct: $0      Out of Pocket Max: $500 (met $70)      Life Max: N/A  CIR: $25 admission copay      SNF: 100% coverage for maximum 100 days Outpatient: 100%     Co-Pay: none Home Health: 100%      Co-Pay: none DME: 80%     Co-Pay: 20% Providers: in network  SECONDARY:       Policy#:       Phone#:   Development worker, community:       Phone#:   The Engineer, petroleum" for patients in Inpatient Rehabilitation Facilities with attached "Privacy Act [place] Records" was provided and verbally reviewed with: Patient and Family  Emergency Contact Information         Contact Information    Name Relation Home Work [place] Daughter [phone]  [phone]     Current Medical History  Patient Admitting Diagnosis: CVA  History of Present Illness: :Katrina Randolph is an 87 year old right-handed female with history of atrial fibrillation not on anticoagulation as patient did see [name] in the past for possible watchman's consideration of which patient declined, hypothyroidism, tobacco use. Per chart review lives alone 1 level  home one-step to entry. Independent with assistive device. She is independent with homemaking ADLs and still drives. Presented 05/30/2020 with aphasia and decrease in balance as well as decreased sensation on her right side of acute onset. 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 Moderate chronic microvascular ischemic disease. 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 Patient did not receive tPA. Admission chemistries unremarkable except potassium 3.4  glucose 118. Echocardiogram with ejection fraction of 55 to 60% no wall motion abnormalities. Carotid Dopplers with no ICA stenosis. Initially maintained on aspirin for CVA prophylaxis and changed to Eliquis in light of history of atrial fibrillation. Tolerating a regular consistency diet. Due to patient's decreased functional ability aphasia was admitted for a comprehensive rehab program.   Complete NIHSS TOTAL: 3 Glasgow Coma Scale Score: 15  Past Medical History      Past Medical History:  Diagnosis Date  . Atrial fibrillation ([place])     Family History  family history is not on file.  Prior Rehab/Hospitalizations:  Has the patient had prior rehab or hospitalizations prior to admission? Yes  Has the patient had major surgery during 100 days prior to admission? No  Current Medications   Current Facility-Administered Medications:  .  acetaminophen (TYLENOL) tablet 650 mg, 650 mg, Oral, Q4H PRN, 650 mg at 06/01/20 0813 **OR** acetaminophen (TYLENOL) 160 MG/5ML solution 650 mg, 650 mg, Per Tube, Q4H PRN **OR** acetaminophen (TYLENOL) suppository 650 mg, 650 mg, Rectal, Q4H PRN, Hugelmeyer, Alexis, DO .  aspirin EC tablet 81 mg, 81 mg, Oral, Daily, Hugelmeyer, Alexis, DO, 81 mg at 06/01/20 0813 .  atorvastatin (LIPITOR) tablet 20 mg, 20 mg, Oral, Daily, Rosalin Hawking, MD, 20 mg at 06/01/20  0813 .  enoxaparin (LOVENOX) injection 40 mg, 40 mg, Subcutaneous, QHS, Hugelmeyer, Alexis, DO, 40 mg at 05/31/20 2100 .  levothyroxine (SYNTHROID) tablet 112 mcg, 112 mcg, Oral, q morning, Hugelmeyer, Alexis, DO, 112 mcg at 06/01/20 0503 .  pantoprazole (PROTONIX) EC tablet 40 mg, 40 mg, Oral, Daily, Hugelmeyer, Alexis, DO, 40 mg at 06/01/20 0813 .  senna-docusate (Senokot-S) tablet 1 tablet, 1 tablet, Oral, QHS PRN, Hugelmeyer, Alexis, DO .  sertraline (ZOLOFT) tablet 50 mg, 50 mg, Oral, Daily, Hugelmeyer, Alexis, DO, 50 mg at 06/01/20 0813  Patients Current Diet:     Diet Order                  Diet Heart Room service appropriate? Yes; Fluid consistency: Thin  Diet effective now                  Precautions / Restrictions Precautions Precautions: Fall Restrictions Weight Bearing Restrictions: No   Has the patient had 2 or more falls or a fall with injury in the past year?Yes  Prior Activity Level Limited Community (1-2x/wk): Went out 2-3 times a week  Prior Functional Level Prior Function Level of Independence: Independent with assistive device(s) Comments: pt stands in shower, has grab bars and a bench but doesn't use it. She is independent with homemaking/ADL's and does not alway use rollator to mobilize. Drives, grocery shops  Self Care: Did the patient need help bathing, dressing, using the toilet or eating?  Independent  Indoor Mobility: Did the patient need assistance with walking from room to room (with or without device)? Independent  Stairs: Did the patient need assistance with internal or external stairs (with or without device)? Independent  Functional Cognition: Did the patient need help planning regular tasks such as shopping or remembering to take medications? [place] Devices/Equipment: Cane (specify quad or straight),Walker (specify type),Built-in shower seat (single point cane, 4  wheeled walker, walk-in shower) Home Equipment: Walker - 4 wheels,Cane - single point,Shower seat - built in  Prior Device Use: Indicate devices/aids used by the patient prior to current illness, exacerbation or injury? Walker and [employer]  Current Functional Level Cognition  Arousal/Alertness: Awake/alert Overall Cognitive Status: Difficult to assess Difficult to assess due to: Impaired communication (expressive difficulties) Orientation Level: Oriented X4 General Comments: follows all commands appropriately, requires cues for safety intermittently and presents with some R inattention Attention: Sustained,Selective Sustained Attention: Appears intact Selective Attention: Appears intact Memory: Appears intact Safety/Judgment: Appears intact    Extremity Assessment (includes Sensation/Coordination)  Upper Extremity Assessment: Defer to OT evaluation RUE Deficits / Details: mild weakness and mild sensory deficits Rt UE RUE Sensation: decreased proprioception RUE Coordination: decreased fine motor  Lower Extremity Assessment: RLE deficits/detail RLE Deficits / Details: Mild weakness noted, however, could not tell where PT was touching when performing sensory tests. Decreased proprioception and had difficulty telling where RLE was in space during mobility RLE Sensation: decreased proprioception,decreased light touch RLE Coordination: decreased gross motor,decreased fine motor    ADLs  Overall ADL's : Needs assistance/impaired Eating/Feeding: Set up,Sitting Grooming: Wash/dry hands,Wash/dry face,Oral care,Brushing hair,Set up,Sitting Upper Body Bathing: Set up,Supervision/ safety,Sitting Lower Body Bathing: Moderate assistance,Sit to/from stand Upper Body Dressing : Minimal assistance,Sitting Lower Body Dressing: Moderate assistance,Sit to/from stand Toilet Transfer: Moderate assistance,+2 for safety/equipment,Ambulation,Comfort height toilet,Grab bars,RW Toileting- Clothing  Manipulation and Hygiene: Moderate assistance,Sit to/from stand Functional mobility during ADLs: Moderate assistance,+2 for safety/equipment General ADL Comments: Pt with balance deficits requiring up to mod A to correct    Mobility  Overal bed mobility: Needs Assistance Bed Mobility: Supine to Sit Supine to sit: Supervision General bed mobility comments: Supervision for safety. With dynamic sitting balance, required min A    Transfers  Overall transfer level: Needs assistance Equipment used: 2 person hand held assist Transfers: Sit to/from Stand Sit to Stand: Min assist,+2 physical assistance,+2 safety/equipment General transfer comment: Min A for steadying to stand.    Ambulation / Gait / Stairs / Wheelchair Mobility  Ambulation/Gait Ambulation/Gait assistance: Min assist,Mod assist,+2 physical assistance,Max assist Gait Distance (Feet): 20 Feet Assistive device: 1 person hand held assist,None Gait Pattern/deviations: Step-through pattern,Decreased stride length,Staggering right General Gait Details: Pt tended to drag RLE at times and with decreased awareness of where RLE was in space. 2 major LOB to the R noted, one specifically when turning. Requiring mod to max A +2 for stability during LOB. Otherwise requiring min to mod A for steadying. Gait velocity: Decreased    Posture / Balance Dynamic Sitting Balance Sitting balance - Comments: Required min A for dynamic balance. Balance Overall balance assessment: Needs assistance Sitting-balance support: No upper extremity supported,Feet supported Sitting balance-Leahy Scale: Fair Sitting balance - Comments: Required min A for dynamic balance. Standing balance support: No upper extremity supported,During functional activity Standing balance-Leahy Scale: Poor Standing balance comment: LOB X2; requiring external support    Special needs/care consideration  None     Previous Home Environment (from acute therapy  documentation) Living Arrangements: Alone Available Help at Discharge: Family,Available PRN/intermittently Type of Home: House Home Layout: One level Home Access: Stairs to enter Entrance Stairs-Rails: None Entrance Stairs-Number of Steps: 1 Bathroom Shower/Tub: Multimedia programmer: Handicapped height Home Care Services: No Additional Comments: daughter lives 2 minutes away from patient.   Discharge Living Setting Plans for Discharge Living Setting: House,Alone ([place]) Type of Home at Discharge: Other (Comment) ([place]) Discharge Home Layout: One level Discharge Home Access: Stairs to enter Entrance Stairs-Rails: None Entrance Stairs-Number of Steps: 1 from garage Discharge Bathroom Shower/Tub: Walk-in shower,Door Discharge [place]: Handicapped height Discharge Bathroom Accessibility: Yes How Accessible: Accessible via wheelchair,Accessible via walker Does the patient have any problems obtaining your  medications?: No  Social/Family/Support Systems Patient Roles: Parent (Has 2 daughters) Contact Information: Nancye Grumbine Anticipated Caregiver: Daughters Ability/Limitations of Caregiver: One daughter lives 2-3 blocks away; another daughter here from [place] for extended time to help Caregiver Availability: 24/7 Discharge Plan Discussed with Primary Caregiver: Yes Is Caregiver In Agreement with Plan?: Yes Does Caregiver/Family have Issues with Lodging/Transportation while Pt is in Rehab?: No  Goals Patient/Family Goal for Rehab: PT/OT supervision to mod I goals Expected length of stay: 7-10 days Cultural Considerations: None Pt/Family Agrees to Admission and willing to participate: Yes Program Orientation Provided & Reviewed with Pt/Caregiver Including Roles  & Responsibilities: Yes  Decrease burden of Care through IP rehab admission: N/A  Possible need for SNF placement upon discharge: Not anticipated   Patient Condition: This  patient's medical and functional status has changed since the consult dated: 05/31/20 in which the Rehabilitation Physician determined and documented that the patient's condition is appropriate for intensive rehabilitative care in an inpatient rehabilitation facility. See "History of Present Illness" (above) for medical update. Functional changes are: Pt.ambulating with min-mod A, transferring at min A level.. Patient's medical and functional status update has been discussed with the Rehabilitation physician and patient remains appropriate for inpatient rehabilitation. Will admit to inpatient rehab tomorrow, 06/03/2020.  Preadmission Screen Completed By:  Retta Diones, RN, 06/01/2020 3:00 PM ______________________________________________________________________   Discussed status with Dr. Naaman Plummer on 06/02/2020  at 1500  and received approval for admission tomorrow 06/03/20.  Admission Coordinator:  Retta Diones, with updated by Clemens Catholic  Time 1620 Sudie Grumbling 06/02/2020       Cosigned by: Meredith Staggers, MD at 06/03/2020 11:42 AM

## 2020-06-05 ENCOUNTER — Encounter (HOSPITAL_COMMUNITY): Payer: Self-pay | Admitting: Physical Medicine & Rehabilitation

## 2020-06-05 ENCOUNTER — Inpatient Hospital Stay (HOSPITAL_COMMUNITY)
Admission: RE | Admit: 2020-06-05 | Discharge: 2020-06-14 | DRG: 057 | Disposition: A | Discharge status: Home / Self Care | Payer: Medicare Other | Source: Intra-hospital | Attending: Physical Medicine & Rehabilitation | Admitting: Physical Medicine & Rehabilitation

## 2020-06-05 ENCOUNTER — Other Ambulatory Visit: Payer: Self-pay

## 2020-06-05 DIAGNOSIS — R208 Other disturbances of skin sensation: Secondary | ICD-10-CM | POA: Diagnosis present

## 2020-06-05 DIAGNOSIS — D649 Anemia, unspecified: Secondary | ICD-10-CM | POA: Diagnosis present

## 2020-06-05 DIAGNOSIS — I69392 Facial weakness following cerebral infarction: Secondary | ICD-10-CM | POA: Diagnosis not present

## 2020-06-05 DIAGNOSIS — Z7901 Long term (current) use of anticoagulants: Secondary | ICD-10-CM | POA: Diagnosis not present

## 2020-06-05 DIAGNOSIS — I4891 Unspecified atrial fibrillation: Secondary | ICD-10-CM | POA: Diagnosis present

## 2020-06-05 DIAGNOSIS — Z79890 Hormone replacement therapy (postmenopausal): Secondary | ICD-10-CM | POA: Diagnosis not present

## 2020-06-05 DIAGNOSIS — E785 Hyperlipidemia, unspecified: Secondary | ICD-10-CM | POA: Diagnosis present

## 2020-06-05 DIAGNOSIS — Z87891 Personal history of nicotine dependence: Secondary | ICD-10-CM

## 2020-06-05 DIAGNOSIS — I69320 Aphasia following cerebral infarction: Principal | ICD-10-CM

## 2020-06-05 DIAGNOSIS — Z6830 Body mass index (BMI) 30.0-30.9, adult: Secondary | ICD-10-CM | POA: Diagnosis not present

## 2020-06-05 DIAGNOSIS — N179 Acute kidney failure, unspecified: Secondary | ICD-10-CM | POA: Diagnosis present

## 2020-06-05 DIAGNOSIS — Z9049 Acquired absence of other specified parts of digestive tract: Secondary | ICD-10-CM | POA: Diagnosis not present

## 2020-06-05 DIAGNOSIS — I63512 Cerebral infarction due to unspecified occlusion or stenosis of left middle cerebral artery: Secondary | ICD-10-CM | POA: Diagnosis not present

## 2020-06-05 DIAGNOSIS — I69398 Other sequelae of cerebral infarction: Secondary | ICD-10-CM | POA: Diagnosis not present

## 2020-06-05 DIAGNOSIS — R7303 Prediabetes: Secondary | ICD-10-CM | POA: Diagnosis present

## 2020-06-05 DIAGNOSIS — R2689 Other abnormalities of gait and mobility: Secondary | ICD-10-CM | POA: Diagnosis present

## 2020-06-05 DIAGNOSIS — M79604 Pain in right leg: Secondary | ICD-10-CM | POA: Diagnosis not present

## 2020-06-05 DIAGNOSIS — I69390 Apraxia following cerebral infarction: Secondary | ICD-10-CM

## 2020-06-05 DIAGNOSIS — Z79899 Other long term (current) drug therapy: Secondary | ICD-10-CM

## 2020-06-05 DIAGNOSIS — R269 Unspecified abnormalities of gait and mobility: Secondary | ICD-10-CM | POA: Diagnosis present

## 2020-06-05 DIAGNOSIS — E039 Hypothyroidism, unspecified: Secondary | ICD-10-CM | POA: Diagnosis present

## 2020-06-05 DIAGNOSIS — Z882 Allergy status to sulfonamides status: Secondary | ICD-10-CM | POA: Diagnosis not present

## 2020-06-05 DIAGNOSIS — R4701 Aphasia: Secondary | ICD-10-CM

## 2020-06-05 DIAGNOSIS — Z90710 Acquired absence of both cervix and uterus: Secondary | ICD-10-CM

## 2020-06-05 DIAGNOSIS — G629 Polyneuropathy, unspecified: Secondary | ICD-10-CM | POA: Diagnosis present

## 2020-06-05 DIAGNOSIS — M792 Neuralgia and neuritis, unspecified: Secondary | ICD-10-CM

## 2020-06-05 LAB — CBC
HCT: 38.9 % (ref 36.0–46.0)
Hemoglobin: 11.9 g/dL — ABNORMAL LOW (ref 12.0–15.0)
MCH: 26.9 pg (ref 26.0–34.0)
MCHC: 30.6 g/dL (ref 30.0–36.0)
MCV: 87.8 fL (ref 80.0–100.0)
Platelets: 187 10*3/uL (ref 150–400)
RBC: 4.43 MIL/uL (ref 3.87–5.11)
RDW: 15.9 % — ABNORMAL HIGH (ref 11.5–15.5)
WBC: 7.7 10*3/uL (ref 4.0–10.5)
nRBC: 0 % (ref 0.0–0.2)

## 2020-06-05 MED ORDER — ACETAMINOPHEN 325 MG PO TABS
650.0000 mg | ORAL_TABLET | ORAL | Status: DC | PRN
  Administered 2020-06-05 – 2020-06-13 (×9): 650 mg via ORAL
  Filled 2020-06-05 (×9): qty 2

## 2020-06-05 MED ORDER — PANTOPRAZOLE SODIUM 40 MG PO TBEC: 40.0000 mg | DELAYED_RELEASE_TABLET | Freq: Every day | ORAL | Status: DC

## 2020-06-05 MED ORDER — ATORVASTATIN CALCIUM 10 MG PO TABS
20.0000 mg | ORAL_TABLET | Freq: Every day | ORAL | Status: DC
  Administered 2020-06-06 – 2020-06-14 (×9): 20 mg via ORAL
  Filled 2020-06-05 (×10): qty 2

## 2020-06-05 MED ORDER — ACETAMINOPHEN 650 MG RE SUPP: 650.0000 mg | RECTAL | Status: DC | PRN

## 2020-06-05 MED ORDER — METOPROLOL SUCCINATE ER 25 MG PO TB24
25.0000 mg | ORAL_TABLET | Freq: Every day | ORAL | Status: DC
  Administered 2020-06-06 – 2020-06-14 (×9): 25 mg via ORAL
  Filled 2020-06-05 (×9): qty 1

## 2020-06-05 MED ORDER — SERTRALINE HCL 50 MG PO TABS
50.0000 mg | ORAL_TABLET | Freq: Every day | ORAL | Status: DC
  Administered 2020-06-06 – 2020-06-14 (×9): 50 mg via ORAL
  Filled 2020-06-05 (×9): qty 1

## 2020-06-05 MED ORDER — PANTOPRAZOLE SODIUM 40 MG PO TBEC
40.0000 mg | DELAYED_RELEASE_TABLET | Freq: Every day | ORAL | Status: DC
  Administered 2020-06-06 – 2020-06-14 (×9): 40 mg via ORAL
  Filled 2020-06-05 (×9): qty 1

## 2020-06-05 MED ORDER — APIXABAN 5 MG PO TABS
5.0000 mg | ORAL_TABLET | Freq: Two times a day (BID) | ORAL | Status: DC
  Administered 2020-06-05 – 2020-06-14 (×18): 5 mg via ORAL
  Filled 2020-06-05 (×17): qty 1

## 2020-06-05 MED ORDER — ATORVASTATIN CALCIUM 10 MG PO TABS: 20.0000 mg | ORAL_TABLET | Freq: Every day | ORAL | Status: DC

## 2020-06-05 MED ORDER — SERTRALINE HCL 50 MG PO TABS: 50.0000 mg | ORAL_TABLET | Freq: Every day | ORAL | Status: DC

## 2020-06-05 MED ORDER — LEVOTHYROXINE SODIUM 112 MCG PO TABS
112.0000 ug | ORAL_TABLET | Freq: Every morning | ORAL | Status: DC
  Administered 2020-06-06 – 2020-06-13 (×8): 112 ug via ORAL
  Filled 2020-06-05 (×8): qty 1

## 2020-06-05 MED ORDER — ACETAMINOPHEN 160 MG/5ML PO SOLN: 650.0000 mg | ORAL | Status: DC | PRN

## 2020-06-05 MED ORDER — SENNOSIDES-DOCUSATE SODIUM 8.6-50 MG PO TABS: 1.0000 | ORAL_TABLET | Freq: Every evening | ORAL | Status: DC | PRN

## 2020-06-05 NOTE — PMR Pre-admission (Signed)
PMR Admission Coordinator Pre-Admission Assessment  Patient: Katrina Randolph is an 87 y.o., female MRN: [id number] DOB: 1930-03-25 Height: '5\' 2"'  (157.5 cm) (pt stated) Weight: 76.8 kg              Insurance Information HMO:      PPO: Yes     PCP:       IPA:       80/20:       OTHER:  Group [id number] PRIMARY: U[employer] Medicare      Policy#: [id number]      Subscriber: patient CM Name: n/a      Phone#: [phone]     Fax#: [phone] [place] with [employer] called 06/02/20  Gave approval for 7  days,  4/22-4/28  fax clinicals to [phone]  Pre-Cert#: [id number]      Employer: Retired Benefits:  Phone #: [phone]     Name: online [email or web address] Eff. Date: 02/12/20     Deduct: $0      Out of Pocket Max: $500 (met $70)      Life Max: N/A  CIR: $25 admission copay      SNF: 100% coverage for maximum 100 days Outpatient: 100%     Co-Pay: none Home Health: 100%      Co-Pay: none DME: 80%     Co-Pay: 20% Providers: in network  SECONDARY:       Policy#:       Phone#:   Development worker, community:       Phone#:   The Engineer, petroleum" for patients in Inpatient Rehabilitation Facilities with attached "Privacy Act [place] Records" was provided and verbally reviewed with: Patient and Family  Emergency Contact Information Contact Information    Name Relation Home Work [place] Daughter [phone]  [phone]     Current Medical History  Patient Admitting Diagnosis: CVA  History of Present Illness: : [name] is an 87 year old right-handed female with history of atrial fibrillation not on anticoagulation as patient did see [name] in the past for possible watchman's consideration of which patient declined, hypothyroidism, tobacco use.  Presented 05/30/2020 with aphasia and decrease in balance as well as decreased sensation on her right side of acute onset.  CT/MRI as well as MRA of the head and neck showed left posterior MCA  territory infarct with occlusion of multiple left M2 and M2/M3 MCA branches.  Moderate chronic microvascular ischemic disease.  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  Patient did not receive tPA.  Admission chemistries unremarkable except potassium 3.4 glucose 118.  Echocardiogram with ejection fraction of 55 to 60% no wall motion abnormalities.  Carotid Dopplers with no ICA stenosis.  Initially maintained on aspirin for CVA prophylaxis and changed to Eliquis in light of history of atrial fibrillation.    Tolerating a regular consistency diet.  Due to patient's decreased functional ability aphasia was recommended for CIR.  Planned for admission 4/23, but held due to afib with rvr.  Pt currently in NSR and ready for admission to CIR.    Complete NIHSS TOTAL: 1 Glasgow Coma Scale Score: (!) 20  Past Medical History  Past Medical History:  Diagnosis Date  . Atrial fibrillation ([place])     Family History  family history is not on file.  Prior Rehab/Hospitalizations:  Has the patient had prior rehab or hospitalizations prior to admission? Yes  Has the patient had major surgery during 100 days prior to admission? No  Current Medications   Current Facility-Administered Medications:  .  acetaminophen (TYLENOL) tablet 650 mg, 650 mg, Oral, Q4H PRN, 650 mg at 06/04/20 2204 **OR** acetaminophen (TYLENOL) 160 MG/5ML solution 650 mg, 650 mg, Per Tube, Q4H PRN, 650 mg at 06/03/20 0511 **OR** acetaminophen (TYLENOL) suppository 650 mg, 650 mg, Rectal, Q4H PRN, Hugelmeyer, Alexis, DO .  apixaban (ELIQUIS) tablet 5 mg, 5 mg, Oral, BID, Little Ishikawa, MD, 5 mg at 06/05/20 0858 .  atorvastatin (LIPITOR) tablet 20 mg, 20 mg, Oral, Daily, Rosalin Hawking, MD, 20 mg at 06/05/20 0858 .  levothyroxine (SYNTHROID) tablet 112 mcg, 112 mcg, Oral, q morning, Hugelmeyer, Alexis, DO, 112 mcg at 06/05/20 0523 .  metoprolol succinate (TOPROL-XL) 24 hr tablet 25 mg, 25 mg, Oral, Daily,  Little Ishikawa, MD, 25 mg at 06/05/20 0858 .  pantoprazole (PROTONIX) EC tablet 40 mg, 40 mg, Oral, Daily, Hugelmeyer, Alexis, DO, 40 mg at 06/05/20 0857 .  senna-docusate (Senokot-S) tablet 1 tablet, 1 tablet, Oral, QHS PRN, Hugelmeyer, Alexis, DO .  sertraline (ZOLOFT) tablet 50 mg, 50 mg, Oral, Daily, Hugelmeyer, Alexis, DO, 50 mg at 06/05/20 7829  Patients Current Diet:  Diet Order            Diet - low sodium heart healthy           Diet Heart Room service appropriate? Yes; Fluid consistency: Thin  Diet effective now                 Precautions / Restrictions Precautions Precautions: Fall Restrictions Weight Bearing Restrictions: No   Has the patient had 2 or more falls or a fall with injury in the past year?Yes  Prior Activity Level Limited Community (1-2x/wk): Went out 2-3 times a week  Prior Functional Level Prior Function Level of Independence: Independent with assistive device(s) Comments: pt stands in shower, has grab bars and a bench but doesn't use it. She is independent with homemaking/ADL's and does not alway use rollator to mobilize. Drives, grocery shops  Self Care: Did the patient need help bathing, dressing, using the toilet or eating?  Independent  Indoor Mobility: Did the patient need assistance with walking from room to room (with or without device)? Independent  Stairs: Did the patient need assistance with internal or external stairs (with or without device)? Independent  Functional Cognition: Did the patient need help planning regular tasks such as shopping or remembering to take medications? [place] / [place] Devices/Equipment: Cane (specify quad or straight),Walker (specify type),Built-in shower seat (single point cane, 4 wheeled walker, walk-in shower) Home Equipment: Walker - 4 wheels,Cane - single point,Shower seat - built in  Prior Device Use: Indicate devices/aids used by the patient prior  to current illness, exacerbation or injury? Walker and [employer]  Current Functional Level Cognition  Arousal/Alertness: Awake/alert Overall Cognitive Status: Impaired/Different from baseline Difficult to assess due to: Impaired communication (expressive difficulties) Orientation Level: Oriented X4 Following Commands: Follows one step commands with increased time Safety/Judgement: Decreased awareness of safety,Decreased awareness of deficits General Comments: R inattention, difficulty following multistep mobility commands at times or follows in a delayed manner. Attention: Sustained,Selective Sustained Attention: Appears intact Selective Attention: Appears intact Memory: Appears intact Safety/Judgment: Appears intact    Extremity Assessment (includes Sensation/Coordination)  Upper Extremity Assessment: Defer to OT evaluation RUE Deficits / Details: mild weakness and mild sensory deficits Rt UE RUE Sensation: decreased proprioception RUE Coordination: decreased fine motor  Lower Extremity Assessment: RLE deficits/detail RLE Deficits /  Details: Mild weakness noted, however, could not tell where PT was touching when performing sensory tests. Decreased proprioception and had difficulty telling where RLE was in space during mobility RLE Sensation: decreased proprioception,decreased light touch RLE Coordination: decreased gross motor,decreased fine motor    ADLs  Overall ADL's : Needs assistance/impaired Eating/Feeding: Set up,Sitting Grooming: Wash/dry hands,Wash/dry face,Oral care,Min guard,Standing,Cueing for safety,Cueing for sequencing Upper Body Bathing: Set up,Supervision/ safety,Sitting Lower Body Bathing: Moderate assistance,Sit to/from stand Upper Body Dressing : Min guard,Sitting,Cueing for sequencing,Cueing for safety Lower Body Dressing: Minimal assistance,Sitting/lateral leans Lower Body Dressing Details (indicate cue type and reason): min A to manage socks Toilet Transfer:  Ambulation,Comfort height toilet,Grab bars,RW,Minimal assistance,Cueing for safety Toileting- Clothing Manipulation and Hygiene: Minimal assistance,Sit to/from stand Functional mobility during ADLs: Minimal assistance,Cueing for safety,Cueing for sequencing,Rolling walker General ADL Comments: Pt with balance deficits requiring up to mod A to correct    Mobility  Overal bed mobility: Needs Assistance Bed Mobility: Supine to Sit Supine to sit: Min guard,HOB elevated General bed mobility comments: pt up in chair upon PT arrival to room, remains in chair at PT exit    Transfers  Overall transfer level: Needs assistance Equipment used: Rolling walker (2 wheeled) Transfers: Sit to/from Stand Sit to Stand: Min assist General transfer comment: min assist to steady upon standing, STS x3 (from recliner x2 and chair in hallway x1). Verbal cuing for hand placement when rising/lowering, followed inconsistently due to increased processing time.    Ambulation / Gait / Stairs / Wheelchair Mobility  Ambulation/Gait Ambulation/Gait assistance: Min assist,Mod Web designer (Feet): 50 Feet (x2 - seated rest break to recover tachycardia, fatigue) Assistive device: Rolling walker (2 wheeled),1 person hand held assist Gait Pattern/deviations: Step-through pattern,Decreased stride length,Staggering right,Narrow base of support General Gait Details: min assist with RW for steadying, guiding pt and RW trajectory, cuing for upright posture and placement within RW. Seated rest due to HR to150s bpm in afib rhythm during gait. 2nd bout of gait with R HHA and mod assist to steady, correct staggering towards R. verbal cuing for widening BOS, demonstrating improved heel-toe gait without RW but increased unsteadiness. Gait velocity: decr    Posture / Balance Dynamic Sitting Balance Sitting balance - Comments: Required min A for dynamic balance. Balance Overall balance assessment: Needs  assistance Sitting-balance support: No upper extremity supported,Feet supported Sitting balance-Leahy Scale: Fair Sitting balance - Comments: Required min A for dynamic balance. Standing balance support: No upper extremity supported,During functional activity Standing balance-Leahy Scale: Poor Standing balance comment: reliant on external support    Special needs/care consideration  None     Previous Home Environment (from acute therapy documentation) Living Arrangements: Alone Available Help at Discharge: Family,Available PRN/intermittently Type of Home: House Home Layout: One level Home Access: Stairs to enter Entrance Stairs-Rails: None Entrance Stairs-Number of Steps: 1 Bathroom Shower/Tub: Multimedia programmer: Handicapped height Home Care Services: No Additional Comments: daughter lives 2 minutes away from patient.   Discharge Living Setting Plans for Discharge Living Setting: Patient's home Type of Home at Discharge: Other (Comment) ([place]) Discharge Home Layout: One level Discharge Home Access: Stairs to enter Entrance Stairs-Rails: None Entrance Stairs-Number of Steps: 1 from garage Discharge Bathroom Shower/Tub: Walk-in shower,Door Discharge [place]: Handicapped height Discharge Bathroom Accessibility: Yes How Accessible: Accessible via wheelchair,Accessible via walker Does the patient have any problems obtaining your medications?: No  Social/Family/Support Systems Patient Roles: Parent (Has 2 daughters) Contact Information: Myosha [place]ras Anticipated Caregiver: Daughters Ability/Limitations of Caregiver: One daughter  lives 2-3 blocks away; another daughter here from [place] for extended time to help Caregiver Availability: 24/7 Discharge Plan Discussed with Primary Caregiver: Yes Is Caregiver In Agreement with Plan?: Yes Does Caregiver/Family have Issues with Lodging/Transportation while Pt is in Rehab?: No  Goals Patient/Family Goal  for Rehab: PT/OT supervision to mod I goals Expected length of stay: 7-10 days Cultural Considerations: None Pt/Family Agrees to Admission and willing to participate: Yes Program Orientation Provided & Reviewed with Pt/Caregiver Including Roles  & Responsibilities: Yes  Decrease burden of Care through IP rehab admission: N/A  Possible need for SNF placement upon discharge: Not anticipated   Patient Condition: This patient's medical and functional status has changed since the consult dated: 05/31/20 in which the Rehabilitation Physician determined and documented that the patient's condition is appropriate for intensive rehabilitative care in an inpatient rehabilitation facility. See "History of Present Illness" (above) for medical update. Functional changes are: Pt.ambulating with min-mod A, transferring at min A level.. Patient's medical and functional status update has been discussed with the Rehabilitation physician and patient remains appropriate for inpatient rehabilitation. Will admit to inpatient rehab today.  Preadmission Screen Completed By:  Michel Santee, PT, 06/05/2020 10:44 AM ______________________________________________________________________   Discussed status with Dr. Ranell Patrick on 06/05/2020  at 1500  and received approval for admission today.  Admission Coordinator:  Michel Santee, PT, DPT with updated by Clemens Catholic  Time 1620 Sudie Grumbling 06/02/2020

## 2020-06-05 NOTE — IPOC Note (Signed)
Individualized overall Plan of Care [facility]) Patient Details Name: Katrina Randolph MRN: [id number] DOB: 28-Dec-1930  Admitting Diagnosis: Left middle cerebral artery stroke [facility])  Hospital Problems: Principal Problem:   Left middle cerebral artery stroke ([place]) Active Problems:   Expressive aphasia   Atrial fibrillation ([place])     Functional Problem List: Nursing Bladder,Endurance,Pain,Safety,Medication Management,Bowel  PT Balance,Endurance,Motor,Safety,Sensory,Skin Integrity  OT Balance,Cognition,Endurance,Motor,Sensory  SLP    TR         Basic ADL's: OT Bathing,Dressing,Toileting     Advanced  ADL's: OT Simple Meal Preparation,Light Housekeeping,Laundry     Transfers: PT Bed Mobility,Bed to Chair,Furniture  OT Toilet,Tub/Shower     Locomotion: PT Ambulation,Stairs     Additional Impairments: OT Fuctional Use of Upper Extremity  SLP Communication comprehension,expression    TR      Anticipated Outcomes Item Anticipated Outcome  Self Feeding independent  Swallowing      Basic self-care  Mod I  Toileting  Mod I   Bathroom Transfers Mod I  Bowel/Bladder  manage bowel and bladder with mod I assist  Transfers  mod I with LRAD  Locomotion  mod I with LRAD  Communication  Min A  Cognition     Pain  Pain at or below level 4  Safety/Judgment  Maintain safety with cues/reminders   Therapy Plan: PT Intensity: Minimum of 1-2 x/day ,45 to 90 minutes PT Frequency: 5 out of 7 days PT Duration Estimated Length of Stay: 5-10 days OT Intensity: Minimum of 1-2 x/day, 45 to 90 minutes OT Frequency: 5 out of 7 days OT Duration/Estimated Length of Stay: 5-7 days SLP Intensity: Minumum of 1-2 x/day, 30 to 90 minutes SLP Frequency: 3 to 5 out of 7 days SLP Duration/Estimated Length of Stay: 5- days    Team Interventions: Nursing Interventions Patient/Family Education,Bowel Management,Pain Management,Discharge Planning,Medication Management,Disease  Management/Prevention,Bladder Management  PT interventions Ambulation/gait training,Discharge planning,Functional mobility training,Psychosocial support,Therapeutic Activities,Visual/perceptual remediation/compensation,Therapeutic Exercise,Skin care/wound management,Neuromuscular re-education,Disease management/prevention,Balance/vestibular training,Wheelchair propulsion/positioning,UE/LE Strength taining/ROM,Cognitive remediation/compensation,DME/adaptive equipment instruction,Pain management,Splinting/orthotics,UE/LE Coordination activities,Stair training,Patient/family education,Community reintegration  OT Interventions Balance/vestibular training,Cognitive remediation/compensation,Discharge planning,Functional mobility training,Neuromuscular re-education,Self Care/advanced ADL retraining,Patient/family education,Therapeutic Activities,Therapeutic Exercise,UE/LE Strength taining/ROM,UE/LE Conservation officer, historic buildings support  SLP Interventions Cognitive remediation/compensation,Cueing hierarchy,Functional tasks,Medication managment,Internal/external aids,Speech/Language facilitation,Patient/family education  TR Interventions    SW/CM Interventions Discharge Planning,Psychosocial Support,Patient/Family Education   Barriers to Discharge MD  Medical stability  Nursing Decreased caregiver support,Home environment access/layout Lived alone 1 level 1 step entry, built in shower and rollator; driving PTA  PT Decreased caregiver support,Home Optician, dispensing for SNF coverage,Lack of/limited family support    OT      SLP      SW Insurance for SNF coverage     Team Discharge Planning: Destination: PT-Home ,OT- Home , SLP-Home Projected Follow-up: PT-Home health PT,24 hour supervision/assistance, OT-  Home health OT, SLP-Home Health SLP,Outpatient SLP Projected Equipment Needs: PT-To be determined, OT- None recommended by OT, SLP-None recommended by SLP Equipment Details:  PT-Pt reports she ownsDME from deceased husband, OT-  Patient/family involved in discharge planning: PT- Patient,  OT-Patient, SLP-Patient  MD ELOS: 6-9 days. Medical Rehab Prognosis:  Good Assessment: 87 year old right-handed female with history of atrial fibrillation not on anticoagulation as patient did see [name] in the past for possible watchman's consideration of which patient declined, hypothyroidism, tobacco use.  Per chart review lives alone 1 level home one-step to entry.  Independent with assistive device.  She is independent with homemaking ADLs and still drives.  Presented 05/30/2020 with aphasia and decrease in balance as  well as decreased sensation on her right side of acute onset.  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  Moderate chronic microvascular ischemic disease.  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  Patient did not receive tPA.  Admission chemistries unremarkable except potassium 3.4 glucose 118.  Echocardiogram with ejection fraction of 55 to 60% no wall motion abnormalities.  Carotid Dopplers with no ICA stenosis.  Initially maintained on aspirin for CVA prophylaxis and changed to Eliquis in light of history of atrial fibrillation.  Patient was to be admitted for CIR but held due to developing RVR and was held to be monitored on telemetry no changes were made except the addition of Toprol and patient remained on Eliquis.   Tolerating a regular consistency diet.  Due to patient's decreased functional ability aphasia was admitted for a comprehensive rehab program. Will set goals for Mod I with PT/OT and Supervision with SLP.  Due to the current state of emergency, patients may not be receiving their 3-hours of Medicare-mandated therapy.  See Team Conference Notes for weekly updates to the plan of care

## 2020-06-05 NOTE — Progress Notes (Signed)
Inpatient Rehabilitation Medication Review by a Pharmacist  A complete drug regimen review was completed for this patient to identify any potential clinically significant medication issues.  Clinically significant medication issues were identified:  no  Check AMION for pharmacist assigned to patient if future medication questions/issues arise during this admission.  Pharmacist comments: No issues identified.  Time spent performing this drug regimen review (minutes):  10   Tajh Livsey, Rocky Crafts 06/05/2020 5:24 PM

## 2020-06-05 NOTE — H&P (Signed)
Physical Medicine and Rehabilitation Admission H&P  CC: Left MCA stroke  HPI: Katrina Randolph is an 87 year old right-handed female with history of atrial fibrillation not on anticoagulation as patient did see [name] in the past for possible watchman's consideration of which patient declined, hypothyroidism, tobacco use.  Per chart review lives alone 1 level home one-step to entry.  Independent with assistive device.  She is independent with homemaking ADLs and still drives.  Presented 05/30/2020 with aphasia and decrease in balance as well as decreased sensation on her right side of acute onset.  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  Moderate chronic microvascular ischemic disease.  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  Patient did not receive tPA.  Admission chemistries unremarkable except potassium 3.4 glucose 118.  Echocardiogram with ejection fraction of 55 to 60% no wall motion abnormalities.  Carotid Dopplers with no ICA stenosis.  Initially maintained on aspirin for CVA prophylaxis and changed to Eliquis in light of history of atrial fibrillation.  Patient was to be admitted for 23 2022 but held due to developing RVR and was held to be monitored on telemetry no changes were made except the addition of Toprol and patient remained on Eliquis.   Tolerating a regular consistency diet.  Due to patient's decreased functional ability aphasia was admitted for a comprehensive rehab program. No complaints this morning.   Review of Systems  Constitutional: Negative for chills and fever.  HENT: Negative for hearing loss.   Eyes: Negative for blurred vision and double vision.  Respiratory: Negative for cough and shortness of breath.   Cardiovascular: Positive for palpitations. Negative for chest pain and leg swelling.  Gastrointestinal: Positive for  constipation. Negative for heartburn, nausea and vomiting.  Genitourinary: Negative for dysuria, flank pain and hematuria.  Musculoskeletal: Positive for joint pain and myalgias.  Skin: Negative for rash.  Neurological: Positive for speech change and weakness.  Psychiatric/Behavioral: Positive for depression. The patient has insomnia.   All other systems reviewed and are negative.  Past Medical History:  Diagnosis Date  . Atrial fibrillation [facility])    Past Surgical History:  Procedure Laterality Date  . ABDOMINAL HYSTERECTOMY    . BIOPSY  10/19/2019   Procedure: BIOPSY;  Surgeon: [name], MD;  Location: [facility];  Service: Gastroenterology;;  . BREAST LUMPECTOMY    . CHOLECYSTECTOMY    . COLONOSCOPY WITH PROPOFOL N/A 10/19/2019   Procedure: COLONOSCOPY WITH PROPOFOL;  Surgeon: [name], MD;  Location: [facility];  Service: Gastroenterology;  Laterality: N/A;  . ESOPHAGOGASTRODUODENOSCOPY (EGD) WITH PROPOFOL N/A 10/19/2019   Procedure: ESOPHAGOGASTRODUODENOSCOPY (EGD) WITH PROPOFOL;  Surgeon: [name], MD;  Location: [facility];  Service: Gastroenterology;  Laterality: N/A;  . POLYPECTOMY  10/19/2019   Procedure: POLYPECTOMY;  Surgeon: [name], MD;  Location: [facility];  Service: Gastroenterology;;   History reviewed. No pertinent family history. Social History:  reports that she has quit smoking. Her smoking use included cigarettes. She has never used smokeless tobacco. She reports current alcohol use. She reports that she does not use drugs. Allergies:  Allergies  Allergen Reactions  . Sulfa Antibiotics     "feels bad"   Medications Prior to Admission  Medication Sig Dispense Refill  . apixaban (ELIQUIS) 5 MG TABS tablet Take 1 tablet (5 mg total) by mouth 2 (two) times daily.  60 tablet 0  . atorvastatin (LIPITOR) 10 MG tablet Take 2 tablets (20 mg total) by mouth daily. 60 tablet 0  . esomeprazole (NEXIUM) 20 MG capsule Take 20 mg by  mouth daily.    . furosemide (LASIX) 20 MG tablet Take 20 mg by mouth daily as needed for fluid.     Marland Kitchen levothyroxine (SYNTHROID) 112 MCG tablet Take 1 tablet (112 mcg total) by mouth every morning. 30 tablet 0  . metoprolol succinate (TOPROL-XL) 25 MG 24 hr tablet Take 1 tablet (25 mg total) by mouth daily. Take with or immediately following a meal. 30 tablet 0  . sertraline (ZOLOFT) 50 MG tablet Take 50 mg by mouth daily.      Drug Regimen Review Drug regimen was reviewed and remains appropriate with no significant issues identified  Home:     Functional History:    Functional Status:  Mobility:          ADL:    Cognition: Cognition Orientation Level: Oriented X4    Physical Exam: Height 5\' 2"  (1.575 m), weight 74.7 kg. Gen: no distress, normal appearing HEENT: oral mucosa pink and moist, NCAT Cardio: Reg rate Pulmonary:     Effort: Pulmonary effort is normal. No respiratory distress.     Breath sounds: Normal breath sounds. No wheezing or rales.  Abdominal:     General: Bowel sounds are normal. There is no distension.     Tenderness: There is no abdominal tenderness.  Musculoskeletal:        General: No swelling or tenderness.     Cervical back: Normal range of motion.  Skin:    General: Skin is warm and dry.     Findings: Bruising present.  Neurological:     Mental Status: She is alert.     Comments: Pt alert and oriented with extra time. Reasonable insight and awarenesWord finding deficits, verbal and motor apraxia. Mild right facial weakness. RUE 4/5. LUE 4+/5. RLE 4+/5. LLE 5/5. Has difficulty synchronizing movements. Sl decreased light touch RLE>RUE. Normal tone.   Psychiatric:        Mood and Affect: Mood normal.        Behavior: Behavior normal.    Results for orders placed or performed during the hospital encounter of 05/30/20 (from the past 48 hour(s))  CBC     Status: Abnormal   Collection Time: 06/04/20  1:34 AM  Result Value Ref Range   WBC  7.9 4.0 - 10.5 K/uL   RBC 4.45 3.87 - 5.11 MIL/uL   Hemoglobin 12.1 12.0 - 15.0 g/dL   HCT 39.1 36.0 - 46.0 %   MCV 87.9 80.0 - 100.0 fL   MCH 27.2 26.0 - 34.0 pg   MCHC 30.9 30.0 - 36.0 g/dL   RDW 16.2 (H) 11.5 - 15.5 %   Platelets 167 150 - 400 K/uL   nRBC 0.0 0.0 - 0.2 %    Comment: Performed at [facility], [place] [address], [place], [place] [address]  CBC     Status: Abnormal   Collection Time: 06/05/20  2:54 AM  Result Value Ref Range   WBC 7.7 4.0 - 10.5 K/uL   RBC 4.43 3.87 - 5.11 MIL/uL   Hemoglobin 11.9 (L) 12.0 - 15.0 g/dL   HCT 38.9 36.0 - 46.0 %   MCV 87.8 80.0 - 100.0 fL   MCH 26.9 26.0 - 34.0 pg   MCHC 30.6 30.0 - 36.0 g/dL   RDW 15.9 (H) 11.5 -  15.5 %   Platelets 187 150 - 400 K/uL   nRBC 0.0 0.0 - 0.2 %    Comment: Performed at [facility], [place] [address] [place], [place] [address]   No results found.     Medical Problem List and Plan: 1.  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  Antithrombotics: -DVT/anticoagulation: Eliquis  -antiplatelet therapy: N/A 3. Pain Management: Tylenol as needed 4. Mood: Zoloft 50 mg daily  -antipsychotic agents: N/A 5. Neuropsych: This patient is capable of making decisions on her own behalf. 6. Skin/Wound Care: Routine skin checks 7. Fluids/Electrolytes/Nutrition: Routine in and outs with follow-up chemistries 8.  Atrial fibrillation.  Cardiac rate controlled.  Continue Toprol 25 mg daily.  Continue Eliquis  -monitor tolerance for increased physical activity 9.  Hypothyroidism.  Synthroid 10.  Hyperlipidemia.  Lipitor 11. Obesity: BMI 30.97: provide counseling  12. Anemia: Hgb 11.9- monitor weekly 13. AKI: Cr 1.02, repeat tomorrow, encourage hydration  I have personally performed a face to face diagnostic evaluation, including, but not limited to relevant history  and physical exam findings, of this patient and developed relevant assessment and plan.  Additionally, I have reviewed and concur with the physician assistant's documentation above.  Leeroy Cha, MD  Lavon Paganini Angiulli, PA-C

## 2020-06-05 NOTE — Discharge Summary (Signed)
Physician Discharge Summary  Katrina Randolph [id number] DOB: 1930/10/05 DOA: 05/30/2020  PCP: [name], PA-C  Admit date: 05/30/2020 Discharge date: 06/05/2020  Admitted From: Home Disposition: CIR  Recommendations for Outpatient Follow-up:  1. Follow up with PCP in 1-2 weeks 2. Please obtain BMP/CBC in one week 3. Please follow up neurology as scheduled:  Discharge Condition: Stable CODE STATUS: Full Diet recommendation: As tolerated  Brief/Interim Summary: Katrina Randolph a 87 y.o.femalewith a known history of atrial fibrillation not on anticoagulation, hypothyroidismpresents to the emergency department for evaluation of altered mental status. Patient was in a usual state of health until 18 hours prior to arrival she was last seen normal. She states that she lost her balance and fell. She subsequently reported right upper extremity and right lower extremity weakness associated with difficulty with word finding, mild confusion. Events were unwitnessed. Patient called her daughter.She lives alone, normally functionally independent, ambulates without assistance. At present patient reports difficulty with word finding, right sided weakness and decreased sensation on the right. Otherwise there has been no change in status. Patient has been taking medication as prescribed and there has been no recent change in medication or diet. No recent antibiotics. There has been no recent illness, hospitalizations, travel or sick contacts.  Patient admitted as above, found to have acute left posterior MCA stroke, neurology was consulted at intake, lengthy discussion with family about need for full dose anticoagulation in the setting of A. fib, there was questionable history of bleeding however on further exam with family this appears to have been a transient event post procedure and she has not had any personal history of profound GI bleeding or hospitalization for bleeding.  Patient  otherwise stable and agreeable for discharge to CIR, close follow-up with cardiology, neurology and PCP in the next few weeks as scheduled.  Patient remains medically stable now for 48h. Awaiting placement.  Discharge Diagnoses:  Active Problems:   CVA (cerebral vascular accident) [facility])    Discharge Instructions  Discharge Instructions    Diet - low sodium heart healthy   Complete by: As directed    Increase activity slowly   Complete by: As directed      Allergies as of 06/05/2020      Reactions   Sulfa Antibiotics    "feels bad"      Medication List    STOP taking these medications   traZODone 50 MG tablet Commonly known as: DESYREL     TAKE these medications   apixaban 5 MG Tabs tablet Commonly known as: ELIQUIS Take 1 tablet (5 mg total) by mouth 2 (two) times daily.   atorvastatin 10 MG tablet Commonly known as: LIPITOR Take 2 tablets (20 mg total) by mouth daily.   esomeprazole 20 MG capsule Commonly known as: NEXIUM Take 20 mg by mouth daily.   furosemide 20 MG tablet Commonly known as: LASIX Take 20 mg by mouth daily as needed for fluid.   levothyroxine 112 MCG tablet Commonly known as: SYNTHROID Take 1 tablet (112 mcg total) by mouth every morning.   metoprolol succinate 25 MG 24 hr tablet Commonly known as: TOPROL-XL Take 1 tablet (25 mg total) by mouth daily. Take with or immediately following a meal. What changed:   medication strength  how much to take   sertraline 50 MG tablet Commonly known as: ZOLOFT Take 50 mg by mouth daily.       Allergies  Allergen Reactions  . Sulfa Antibiotics     "feels bad"  Consultations: Neuro, cardiology  Procedures/Studies: DG Chest 2 View  Result Date: 05/31/2020 CLINICAL DATA:  TIA EXAM: CHEST - 2 VIEW COMPARISON:  Radiograph 10/12/2018 FINDINGS: Diffuse pulmonary vascular congestion with hazy and streaky opacities in the mid to lower lungs and more patchy, coalescent opacity in the  retrocardiac space. No pneumothorax or visible effusion. Biapical pleuroparenchymal scarring. Stable cardiomegaly with a calcified, tortuous aorta. The osseous structures appear diffusely demineralized which may limit detection of small or nondisplaced fractures. No acute osseous or soft tissue abnormality. Degenerative changes are present in the imaged spine and shoulders. Telemetry leads overlie the chest. IMPRESSION: Diffuse pulmonary vascular congestion with hazy and streaky opacities in the mid to lower lungs and more patchy, coalescent opacity in the retrocardiac space. Findings may reflect some developing edema and/or atelectasis. Infection less favored though not fully excluded in the appropriate clinical context. Electronically Signed   By: Lovena Le M.D.   On: 05/31/2020 00:11   CT Head Wo Contrast  Result Date: 05/30/2020 CLINICAL DATA:  Altered mental status, last known normal 12 hours ago EXAM: CT HEAD WITHOUT CONTRAST TECHNIQUE: Contiguous axial images were obtained from the base of the skull through the vertex without intravenous contrast. COMPARISON:  None. FINDINGS: Brain: No evidence of acute large vascular territory infarction, hemorrhage, hydrocephalus, extra-axial collection or mass lesion/mass effect. Age related global parenchymal volume loss with ex vacuo dilatation of ventricular system and prominent extra-axial spaces. There are patchy subcortical and periventricular white matter hypodensities which are nonspecific but favored represent sequela of chronic ischemic small vessel white matter disease. Vascular: No hyperdense vessel. Atherosclerotic calcifications of the intracranial portions of the internal carotid and vertebral arteries. Skull: Hyperostosis frontalis. Negative for fracture or focal lesion. Sinuses/Orbits: The paranasal sinuses and mastoid air cells are predominantly clear. Pneumatized petrous apices. Orbits are grossly unremarkable. Other: None IMPRESSION: 1. No acute  intracranial findings. 2. Age related global parenchymal volume loss and chronic ischemic small vessel white matter disease. Electronically Signed   By: Dahlia Bailiff MD   On: 05/30/2020 14:34   MR ANGIO HEAD WO CONTRAST  Result Date: 05/30/2020 CLINICAL DATA:  Neuro deficit, acute stroke suspected. EXAM: MRI HEAD WITHOUT CONTRAST MRA HEAD WITHOUT CONTRAST TECHNIQUE: Multiplanar, multiecho pulse sequences of the brain and surrounding structures were obtained without intravenous contrast. Angiographic images of the head were obtained using MRA technique without contrast. COMPARISON:  None. FINDINGS: MRI HEAD FINDINGS Brain: Multiple acute infarcts in the left MCA territory, including acute infarct of the posterior left insula, left periventricular frontal lobe and left frontoparietal cortex superiorly. There is T2/FLAIR hyperintense mild edema associated with these infarcts. No substantial mass effect. No midline shift. Basal cisterns are patent. No evidence of acute hemorrhage. No extra-axial fluid collections. Additional moderate T2/FLAIR hyperintensities within the white matter and pons, nonspecific but most likely related to chronic microvascular ischemic disease. Frontal predominant cerebral volume loss with associated prominence of the overlying extra-axial spaces. Vascular: Major arterial flow voids are maintained at the skull base. Skull and upper cervical spine: Normal marrow signal. Sinuses/Orbits: Clear sinuses.  Unremarkable orbits. Other: No sizable mastoid effusions. MRA HEAD FINDINGS Anterior circulation: Bilateral ICAs are patent through the carotid terminus. Bilateral M1 MCAs are patent. Left proximal M2 MCA inferior division is poorly visualized, but within an area of artifact. There is occlusion of two other proximal left M2 MCA branches in the left sylvian fissure (see series 24, images 41 and 45). Additionally, there is suspected occlusion of a more distal left  M2/M3 MCA branch (series 24,  image 30). Smaller left A1 ACA, likely congenital. Otherwise, patent ACAs. Possible small (approximately 2 mm) outpouching in the region of the right ICA terminus. Posterior circulation: Patent intradural vertebral arteries and basilar artery. Right fetal type PCA. Patent proximal posterior cerebral arteries with suspected mild stenosis of the right P2 PCA. IMPRESSION: 1. Left posterior MCA territory acute infarcts with occlusion of multiple left M2 and M2/M3 MCA branches, as described above. Associated edema without substantial mass effect. 2. Moderate chronic microvascular ischemic disease. 3. Frontal predominant cerebral volume loss. 4. Possible small (approximately 2 mm) aneurysm in the region of the right ICA terminus. A CTA could further characterize if clinically indicated Critical/urgent findings discussed with Dr. Rory Percy at 6:14 p.m via telephone. Electronically Signed   By: Margaretha Sheffield MD   On: 05/30/2020 18:43   MR BRAIN WO CONTRAST  Result Date: 05/30/2020 CLINICAL DATA:  Neuro deficit, acute stroke suspected. EXAM: MRI HEAD WITHOUT CONTRAST MRA HEAD WITHOUT CONTRAST TECHNIQUE: Multiplanar, multiecho pulse sequences of the brain and surrounding structures were obtained without intravenous contrast. Angiographic images of the head were obtained using MRA technique without contrast. COMPARISON:  None. FINDINGS: MRI HEAD FINDINGS Brain: Multiple acute infarcts in the left MCA territory, including acute infarct of the posterior left insula, left periventricular frontal lobe and left frontoparietal cortex superiorly. There is T2/FLAIR hyperintense mild edema associated with these infarcts. No substantial mass effect. No midline shift. Basal cisterns are patent. No evidence of acute hemorrhage. No extra-axial fluid collections. Additional moderate T2/FLAIR hyperintensities within the white matter and pons, nonspecific but most likely related to chronic microvascular ischemic disease. Frontal  predominant cerebral volume loss with associated prominence of the overlying extra-axial spaces. Vascular: Major arterial flow voids are maintained at the skull base. Skull and upper cervical spine: Normal marrow signal. Sinuses/Orbits: Clear sinuses.  Unremarkable orbits. Other: No sizable mastoid effusions. MRA HEAD FINDINGS Anterior circulation: Bilateral ICAs are patent through the carotid terminus. Bilateral M1 MCAs are patent. Left proximal M2 MCA inferior division is poorly visualized, but within an area of artifact. There is occlusion of two other proximal left M2 MCA branches in the left sylvian fissure (see series 24, images 41 and 45). Additionally, there is suspected occlusion of a more distal left M2/M3 MCA branch (series 24, image 30). Smaller left A1 ACA, likely congenital. Otherwise, patent ACAs. Possible small (approximately 2 mm) outpouching in the region of the right ICA terminus. Posterior circulation: Patent intradural vertebral arteries and basilar artery. Right fetal type PCA. Patent proximal posterior cerebral arteries with suspected mild stenosis of the right P2 PCA. IMPRESSION: 1. Left posterior MCA territory acute infarcts with occlusion of multiple left M2 and M2/M3 MCA branches, as described above. Associated edema without substantial mass effect. 2. Moderate chronic microvascular ischemic disease. 3. Frontal predominant cerebral volume loss. 4. Possible small (approximately 2 mm) aneurysm in the region of the right ICA terminus. A CTA could further characterize if clinically indicated Critical/urgent findings discussed with Dr. Rory Percy at 6:14 p.m via telephone. Electronically Signed   By: Margaretha Sheffield MD   On: 05/30/2020 18:43   DG Swallowing Func-Speech Pathology  Result Date: 06/01/2020 Objective Swallowing Evaluation: Type of Study: MBS-Modified Barium Swallow Study  Patient Details Name: Katrina Randolph MRN: [id number] Date of Birth: 1930/08/04 Today's Date: 06/01/2020  Time: SLP Start Time (ACUTE ONLY): 1003 -SLP Stop Time (ACUTE ONLY): 1017 SLP Time Calculation (min) (ACUTE ONLY): 14 min Past Medical History: Past Medical  History: Diagnosis Date . Atrial fibrillation [facility])  Past Surgical History: Past Surgical History: Procedure Laterality Date . ABDOMINAL HYSTERECTOMY   . BIOPSY  10/19/2019  Procedure: BIOPSY;  Surgeon: [name], MD;  Location: [facility];  Service: Gastroenterology;; . BREAST LUMPECTOMY   . CHOLECYSTECTOMY   . COLONOSCOPY WITH PROPOFOL N/A 10/19/2019  Procedure: COLONOSCOPY WITH PROPOFOL;  Surgeon: [name], MD;  Location: [facility];  Service: Gastroenterology;  Laterality: N/A; . ESOPHAGOGASTRODUODENOSCOPY (EGD) WITH PROPOFOL N/A 10/19/2019  Procedure: ESOPHAGOGASTRODUODENOSCOPY (EGD) WITH PROPOFOL;  Surgeon: [name], MD;  Location: [facility];  Service: Gastroenterology;  Laterality: N/A; . POLYPECTOMY  10/19/2019  Procedure: POLYPECTOMY;  Surgeon: [name], MD;  Location: [facility];  Service: Gastroenterology;; HPI: Pt is an 87 y.o. female with a PMHx of permanent atrial fibrillation with RVT not on AC, HTN, HLD not on statin, orthostatic hypotension, symptomatic recurrent anemia, GERD, depression, breast cancer, hypothyroidism,  who presented to [facility] ED with a chief complaint of inability to communicate effectively and decreased sensation on her right side. Daughter also noted some difficulty walking and right sided weakness. She was out of the time window for tPA at the time of arrival to the ED. MRI brain revealed left posterior MCA territory acute infarctions with occlusion of multiple left M2 and M2/M3 branches.  Chest CT (05/30/20) revealed "Diffuse pulmonary vascular congestion with hazy and streaky opacities in the mid to lower lungs and more patchy, coalescent opacity in the retrocardiac space. Findings may reflect some developing edema and/or atelectasis. Infection less favored though not fully excluded in  the appropriate clinical context. Per NP note (05/31/20), "passed nursing bedside swallow but coughing during my visit today with liquids". BSE requested.  No data recorded Assessment / Plan / Recommendation CHL IP CLINICAL IMPRESSIONS 06/01/2020 Clinical Impression Pt presents with normal oropharyngeal swallow. With all solid and liquid pt demonstrated adequate oral manipulation, cohesion and timely swallow trigger with strong pharyngeal peristalsis and laryngeal closure which was evident with absence of penetration/aspiration and normal amounts of residuals in vallecula and pyriform sinuses. It should be noted, however, that pt exhibited initial difficulty obtaining anterior- posterior oral transit of whole pill with thin liquids and eventually lodged in vallecular space briefly before fully propelled into cervical esophagus with help of puree wash. Cough x1 noted after swallowing whole pill, but no evidence of airway compromise with fluoro turned on. Recommend pt continue on regular, thin liquid diet with meds whole (puree wash as needed). SLP to f/u for tolerance and education. SLP Visit Diagnosis Dysphagia, unspecified (R13.10) Attention and concentration deficit following -- Frontal lobe and executive function deficit following -- Impact on safety and function No limitations   CHL IP TREATMENT RECOMMENDATION 06/01/2020 Treatment Recommendations Therapy as outlined in treatment plan below   Prognosis 06/01/2020 Prognosis for Safe Diet Advancement Good Barriers to Reach Goals Language deficits Barriers/Prognosis Comment -- CHL IP DIET RECOMMENDATION 06/01/2020 SLP Diet Recommendations Regular solids;Thin liquid Liquid Administration via Straw;Cup Medication Administration Whole meds with liquid Compensations Slow rate;Small sips/bites Postural Changes Seated upright at 90 degrees   CHL IP OTHER RECOMMENDATIONS 06/01/2020 Recommended Consults -- Oral Care Recommendations Oral care BID Other Recommendations --   CHL IP  FOLLOW UP RECOMMENDATIONS 06/01/2020 Follow up Recommendations Inpatient Rehab   CHL IP FREQUENCY AND DURATION 06/01/2020 Speech Therapy Frequency (ACUTE ONLY) min 2x/week Treatment Duration 2 weeks      CHL IP ORAL PHASE 06/01/2020 Oral Phase WFL Oral - Pudding Teaspoon -- Oral - Pudding Cup --  Oral - Honey Teaspoon -- Oral - Honey Cup -- Oral - Nectar Teaspoon -- Oral - Nectar Cup -- Oral - Nectar Straw -- Oral - Thin Teaspoon -- Oral - Thin Cup -- Oral - Thin Straw -- Oral - Puree -- Oral - Mech Soft -- Oral - Regular -- Oral - Multi-Consistency -- Oral - Pill -- Oral Phase - Comment --  CHL IP PHARYNGEAL PHASE 06/01/2020 Pharyngeal Phase WFL Pharyngeal- Pudding Teaspoon -- Pharyngeal -- Pharyngeal- Pudding Cup -- Pharyngeal -- Pharyngeal- Honey Teaspoon -- Pharyngeal -- Pharyngeal- Honey Cup -- Pharyngeal -- Pharyngeal- Nectar Teaspoon -- Pharyngeal -- Pharyngeal- Nectar Cup -- Pharyngeal -- Pharyngeal- Nectar Straw -- Pharyngeal -- Pharyngeal- Thin Teaspoon -- Pharyngeal -- Pharyngeal- Thin Cup -- Pharyngeal -- Pharyngeal- Thin Straw -- Pharyngeal -- Pharyngeal- Puree -- Pharyngeal -- Pharyngeal- Mechanical Soft -- Pharyngeal -- Pharyngeal- Regular -- Pharyngeal -- Pharyngeal- Multi-consistency -- Pharyngeal -- Pharyngeal- Pill -- Pharyngeal -- Pharyngeal Comment --  CHL IP CERVICAL ESOPHAGEAL PHASE 06/01/2020 Cervical Esophageal Phase WFL Pudding Teaspoon -- Pudding Cup -- Honey Teaspoon -- Honey Cup -- Nectar Teaspoon -- Nectar Cup -- Nectar Straw -- Thin Teaspoon -- Thin Cup -- Thin Straw -- Puree -- Mechanical Soft -- Regular -- Multi-consistency -- Pill -- Cervical Esophageal Comment -- Ellwood Dense, MA, CCC-SLP Acute Rehabilitation Services Office Number: [phone] Acie Fredrickson 06/01/2020, 11:39 AM              ECHOCARDIOGRAM COMPLETE  Result Date: 05/31/2020    ECHOCARDIOGRAM REPORT   Patient Name:   Katrina Randolph Date of Exam: 05/31/2020 Medical Rec #:  [id number]             Height:        62.0 in Accession #:    [id number]            Weight:       169.3 lb Date of Birth:  09-04-1930             BSA:          1.781 m Patient Age:    31 years              BP:           129/77 mmHg Patient Gender: F                     HR:           103 bpm. Exam Location:  Inpatient Procedure: 2D Echo, Cardiac Doppler and Color Doppler Indications:    Stroke I63.9  History:        Patient has prior history of Echocardiogram examinations, most                 recent 10/13/2019. Arrythmias:Atrial Fibrillation.  Sonographer:    Tiffany Dance Referring Phys: [id number] Morris  1. Left ventricular ejection fraction, by estimation, is 55 to 60%. The left ventricle has normal function. The left ventricle has no regional wall motion abnormalities. Left ventricular diastolic function could not be evaluated.  2. Right ventricular systolic function is moderately reduced. The right ventricular size is mildly enlarged. There is severely elevated pulmonary artery systolic pressure. The estimated right ventricular systolic pressure is 123XX123 mmHg.  3. Left atrial size was severely dilated.  4. Right atrial size was severely dilated.  5. The pericardial effusion is posterior to the left ventricle.  6. The mitral valve is normal in structure. Mild mitral valve  regurgitation. No evidence of mitral stenosis.  7. Tricuspid valve regurgitation is mild to moderate.  8. The aortic valve is normal in structure. Aortic valve regurgitation is not visualized. No aortic stenosis is present.  9. The inferior vena cava is dilated in size with >50% respiratory variability, suggesting right atrial pressure of 8 mmHg. FINDINGS  Left Ventricle: Left ventricular ejection fraction, by estimation, is 55 to 60%. The left ventricle has normal function. The left ventricle has no regional wall motion abnormalities. The left ventricular internal cavity size was normal in size. There is  no left ventricular hypertrophy. Left ventricular  diastolic function could not be evaluated due to atrial fibrillation. Left ventricular diastolic function could not be evaluated. Right Ventricle: The right ventricular size is mildly enlarged. No increase in right ventricular wall thickness. Right ventricular systolic function is moderately reduced. There is severely elevated pulmonary artery systolic pressure. The tricuspid regurgitant velocity is 3.69 m/s, and with an assumed right atrial pressure of 8 mmHg, the estimated right ventricular systolic pressure is 76.1 mmHg. Left Atrium: Left atrial size was severely dilated. Right Atrium: Right atrial size was severely dilated. Pericardium: Trivial pericardial effusion is present. The pericardial effusion is posterior to the left ventricle. Mitral Valve: The mitral valve is normal in structure. Mild mitral valve regurgitation. No evidence of mitral valve stenosis. Tricuspid Valve: The tricuspid valve is normal in structure. Tricuspid valve regurgitation is mild to moderate. No evidence of tricuspid stenosis. Aortic Valve: The aortic valve is normal in structure. Aortic valve regurgitation is not visualized. No aortic stenosis is present. Pulmonic Valve: The pulmonic valve was normal in structure. Pulmonic valve regurgitation is trivial. No evidence of pulmonic stenosis. Aorta: The aortic root is normal in size and structure. Venous: The inferior vena cava is dilated in size with greater than 50% respiratory variability, suggesting right atrial pressure of 8 mmHg. IAS/Shunts: The interatrial septum appears to be lipomatous. No atrial level shunt detected by color flow Doppler.  LEFT VENTRICLE PLAX 2D LVIDd:         4.20 cm LVIDs:         3.30 cm LV PW:         1.10 cm LV IVS:        1.10 cm LVOT diam:     1.80 cm LV SV:         33 LV SV Index:   18 LVOT Area:     2.54 cm  RIGHT VENTRICLE          IVC RV Basal diam:  3.60 cm  IVC diam: 2.60 cm RV Mid diam:    2.60 cm TAPSE (M-mode): 1.3 cm LEFT ATRIUM              Index       RIGHT ATRIUM           Index LA diam:        5.70 cm 3.20 cm/m  RA Area:     30.60 cm LA Vol (A2C):   93.1 ml 52.27 ml/m RA Volume:   110.00 ml 61.76 ml/m LA Vol (A4C):   81.0 ml 45.48 ml/m LA Biplane Vol: 87.7 ml 49.24 ml/m  AORTIC VALVE LVOT Vmax:   69.45 cm/s LVOT Vmean:  45.100 cm/s LVOT VTI:    0.129 m  AORTA Ao Root diam: 3.00 cm Ao Asc diam:  3.00 cm MITRAL VALVE                TRICUSPID  VALVE MV Area (PHT): 3.27 cm     TR Peak grad:   54.5 mmHg MV Decel Time: 232 msec     TR Vmax:        369.00 cm/s MV E velocity: 112.00 cm/s MV A velocity: 41.20 cm/s   SHUNTS MV E/A ratio:  2.72         Systemic VTI:  0.13 m                             Systemic Diam: 1.80 cm Fransico Him MD Electronically signed by Fransico Him MD Signature Date/Time: 05/31/2020/3:43:30 PM    Final    VAS US CAROTID  Result Date: 05/31/2020 Carotid Arterial Duplex Study Indications: CVA. Performing Technologist: Abram Sander RVS  Examination Guidelines: A complete evaluation includes B-mode imaging, spectral Doppler, color Doppler, and power Doppler as needed of all accessible portions of each vessel. Bilateral testing is considered an integral part of a complete examination. Limited examinations for reoccurring indications may be performed as noted.  Right Carotid Findings: +----------+--------+--------+--------+------------------+--------+           PSV cm/sEDV cm/sStenosisPlaque DescriptionComments +----------+--------+--------+--------+------------------+--------+ CCA Prox  41      9               heterogenous               +----------+--------+--------+--------+------------------+--------+ CCA Distal41      10              heterogenous               +----------+--------+--------+--------+------------------+--------+ ICA Prox  73      18      1-39%   heterogenous               +----------+--------+--------+--------+------------------+--------+ ICA Distal93      24                                          +----------+--------+--------+--------+------------------+--------+ ECA       90                                                 +----------+--------+--------+--------+------------------+--------+ +----------+--------+-------+--------+-------------------+           PSV cm/sEDV cmsDescribeArm Pressure (mmHG) +----------+--------+-------+--------+-------------------+ [id number]                                         +----------+--------+-------+--------+-------------------+ +---------+--------+--+--------+--+---------+ VertebralPSV cm/s66EDV cm/s10Antegrade +---------+--------+--+--------+--+---------+  Left Carotid Findings: +----------+--------+--------+--------+------------------+--------+           PSV cm/sEDV cm/sStenosisPlaque DescriptionComments +----------+--------+--------+--------+------------------+--------+ CCA Prox  48      7               heterogenous               +----------+--------+--------+--------+------------------+--------+ CCA Distal33      5               heterogenous               +----------+--------+--------+--------+------------------+--------+ ICA Prox  102     22      1-39%  heterogenous               +----------+--------+--------+--------+------------------+--------+ ICA Distal62      19                                         +----------+--------+--------+--------+------------------+--------+ ECA       71      8                                          +----------+--------+--------+--------+------------------+--------+ +----------+--------+--------+--------+-------------------+           PSV cm/sEDV cm/sDescribeArm Pressure (mmHG) +----------+--------+--------+--------+-------------------+ [id number]                                          +----------+--------+--------+--------+-------------------+ +---------+--------+--+--------+--+---------+ VertebralPSV cm/s39EDV  cm/s13Antegrade +---------+--------+--+--------+--+---------+   Summary: Right Carotid: Velocities in the right ICA are consistent with a 1-39% stenosis. Left Carotid: Velocities in the left ICA are consistent with a 1-39% stenosis. Vertebrals: Bilateral vertebral arteries demonstrate antegrade flow. *See table(s) above for measurements and observations.  Electronically signed by Jamelle Haring on 05/31/2020 at 4:30:40 PM.    Final      Subjective: No acute issues or events overnight   Discharge Exam: Vitals:   06/05/20 0018 06/05/20 0527  BP: (!) 155/90 118/89  Pulse: 95 89  Resp: 20 (!) 22  Temp: 98.1 F (36.7 C) 97.6 F (36.4 C)  SpO2: 96% 96%   Vitals:   06/04/20 0701 06/04/20 1500 06/05/20 0018 06/05/20 0527  BP: (!) 133/98 131/75 (!) 155/90 118/89  Pulse:   95 89  Resp: 16  20 (!) 22  Temp: 97.8 F (36.6 C) 98.1 F (36.7 C) 98.1 F (36.7 C) 97.6 F (36.4 C)  TempSrc: Oral Oral Oral Oral  SpO2: 95% 96% 96% 96%  Weight:      Height:        General: Pt is alert, awake, not in acute distress Cardiovascular: RRR, S1/S2 +, no rubs, no gallops Respiratory: CTA bilaterally, no wheezing, no rhonchi Abdominal: Soft, NT, ND, bowel sounds + Extremities: no edema, no cyanosis    The results of significant diagnostics from this hospitalization (including imaging, microbiology, ancillary and laboratory) are listed below for reference.     Microbiology: Recent Results (from the past 240 hour(s))  Resp Panel by RT-PCR (Flu A&B, Covid) Nasopharyngeal Swab     Status: None   Collection Time: 05/30/20  3:29 PM   Specimen: Nasopharyngeal Swab; Nasopharyngeal(NP) swabs in vial transport medium  Result Value Ref Range Status   SARS Coronavirus 2 by RT PCR NEGATIVE NEGATIVE Final    Comment: (NOTE) SARS-CoV-2 target nucleic acids are NOT DETECTED.  The SARS-CoV-2 RNA is generally detectable in upper respiratory specimens during the acute phase of infection. The  lowest concentration of SARS-CoV-2 viral copies this assay can detect is 138 copies/mL. A negative result does not preclude SARS-Cov-2 infection and should not be used as the sole basis for treatment or other patient management decisions. A negative result may occur with  improper specimen collection/handling, submission of specimen other than nasopharyngeal swab, presence of viral mutation(s) within the areas targeted by this assay, and inadequate number of viral copies(<138 copies/mL).  A negative result must be combined with clinical observations, patient history, and epidemiological information. The expected result is Negative.  Fact Sheet for Patients:  EntrepreneurPulse.com.au  Fact Sheet for Healthcare Providers:  IncredibleEmployment.be  This test is no t yet approved or cleared by the [place] FDA and  has been authorized for detection and/or diagnosis of SARS-CoV-2 by FDA under an Emergency Use Authorization (EUA). This EUA will remain  in effect (meaning this test can be used) for the duration of the COVID-19 declaration under Section 564(b)(1) of the Act, 21 U.S.C.section 360bbb-3(b)(1), unless the authorization is terminated  or revoked sooner.       Influenza A by PCR NEGATIVE NEGATIVE Final   Influenza B by PCR NEGATIVE NEGATIVE Final    Comment: (NOTE) The Xpert Xpress SARS-CoV-2/FLU/RSV plus assay is intended as an aid in the diagnosis of influenza from Nasopharyngeal swab specimens and should not be used as a sole basis for treatment. Nasal washings and aspirates are unacceptable for Xpert Xpress SARS-CoV-2/FLU/RSV testing.  Fact Sheet for Patients: EntrepreneurPulse.com.au  Fact Sheet for Healthcare Providers: IncredibleEmployment.be  This test is not yet approved or cleared by the [place] FDA and has been authorized for detection and/or diagnosis of SARS-CoV-2 by FDA under  an Emergency Use Authorization (EUA). This EUA will remain in effect (meaning this test can be used) for the duration of the COVID-19 declaration under Section 564(b)(1) of the Act, 21 U.S.C. section 360bbb-3(b)(1), unless the authorization is terminated or revoked.  Performed at [facility], [place] [address] [place], [place] [address]      Labs: BNP (last 3 results) Recent Labs    10/12/19 1427  BNP 0000000*   Basic Metabolic Panel: Recent Labs  Lab 05/30/20 1338 05/31/20 0022 06/01/20 0112  NA 139  --  140  K 3.4*  --  3.8  CL 106  --  109  CO2 23  --  23  GLUCOSE 118*  --  115*  BUN 16  --  11  CREATININE 0.91 0.82 1.02*  CALCIUM 8.8*  --  8.7*   Liver Function Tests: Recent Labs  Lab 05/30/20 1338  AST 16  ALT 13  ALKPHOS 60  BILITOT 1.7*  PROT 7.3  ALBUMIN 4.0   No results for input(s): LIPASE, AMYLASE in the last 168 hours. No results for input(s): AMMONIA in the last 168 hours. CBC: Recent Labs  Lab 05/30/20 1338 05/31/20 0022 06/01/20 0112 06/02/20 0059 06/03/20 0033 06/04/20 0134 06/05/20 0254  WBC 8.1   < > 6.9 6.8 8.9 7.9 7.7  NEUTROABS 6.6  --   --   --   --   --   --   HGB 12.3   < > 11.5* 11.8* 12.4 12.1 11.9*  HCT 40.7   < > 36.7 38.4 40.8 39.1 38.9  MCV 90.0   < > 87.6 88.3 88.5 87.9 87.8  PLT 196   < > 175 174 204 167 187   < > = values in this interval not displayed.   Cardiac Enzymes: No results for input(s): CKTOTAL, CKMB, CKMBINDEX, TROPONINI in the last 168 hours. BNP: Invalid input(s): POCBNP CBG: No results for input(s): GLUCAP in the last 168 hours. D-Dimer No results for input(s): DDIMER in the last 72 hours. Hgb A1c No results for input(s): HGBA1C in the last 72 hours. Lipid Profile No results for input(s): CHOL, HDL, LDLCALC, TRIG, CHOLHDL, LDLDIRECT in the last 72 hours. Thyroid function studies No results  for input(s): TSH, T4TOTAL, T3FREE, THYROIDAB in the last 72 hours.  Invalid input(s):  FREET3 Anemia work up No results for input(s): VITAMINB12, FOLATE, FERRITIN, TIBC, IRON, RETICCTPCT in the last 72 hours. Urinalysis    Component Value Date/Time   COLORURINE AMBER (A) 05/30/2020 1332   APPEARANCEUR CLEAR 05/30/2020 1332   LABSPEC 1.023 05/30/2020 1332   PHURINE 5.0 05/30/2020 1332   GLUCOSEU NEGATIVE 05/30/2020 1332   HGBUR SMALL (A) 05/30/2020 1332   BILIRUBINUR NEGATIVE 05/30/2020 1332   KETONESUR 5 (A) 05/30/2020 1332   PROTEINUR 30 (A) 05/30/2020 1332   NITRITE NEGATIVE 05/30/2020 1332   LEUKOCYTESUR NEGATIVE 05/30/2020 1332   Sepsis Labs Invalid input(s): PROCALCITONIN,  WBC,  LACTICIDVEN Microbiology Recent Results (from the past 240 hour(s))  Resp Panel by RT-PCR (Flu A&B, Covid) Nasopharyngeal Swab     Status: None   Collection Time: 05/30/20  3:29 PM   Specimen: Nasopharyngeal Swab; Nasopharyngeal(NP) swabs in vial transport medium  Result Value Ref Range Status   SARS Coronavirus 2 by RT PCR NEGATIVE NEGATIVE Final    Comment: (NOTE) SARS-CoV-2 target nucleic acids are NOT DETECTED.  The SARS-CoV-2 RNA is generally detectable in upper respiratory specimens during the acute phase of infection. The lowest concentration of SARS-CoV-2 viral copies this assay can detect is 138 copies/mL. A negative result does not preclude SARS-Cov-2 infection and should not be used as the sole basis for treatment or other patient management decisions. A negative result may occur with  improper specimen collection/handling, submission of specimen other than nasopharyngeal swab, presence of viral mutation(s) within the areas targeted by this assay, and inadequate number of viral copies(<138 copies/mL). A negative result must be combined with clinical observations, patient history, and epidemiological information. The expected result is Negative.  Fact Sheet for Patients:  EntrepreneurPulse.com.au  Fact Sheet for Healthcare Providers:   IncredibleEmployment.be  This test is no t yet approved or cleared by the [place] FDA and  has been authorized for detection and/or diagnosis of SARS-CoV-2 by FDA under an Emergency Use Authorization (EUA). This EUA will remain  in effect (meaning this test can be used) for the duration of the COVID-19 declaration under Section 564(b)(1) of the Act, 21 U.S.C.section 360bbb-3(b)(1), unless the authorization is terminated  or revoked sooner.       Influenza A by PCR NEGATIVE NEGATIVE Final   Influenza B by PCR NEGATIVE NEGATIVE Final    Comment: (NOTE) The Xpert Xpress SARS-CoV-2/FLU/RSV plus assay is intended as an aid in the diagnosis of influenza from Nasopharyngeal swab specimens and should not be used as a sole basis for treatment. Nasal washings and aspirates are unacceptable for Xpert Xpress SARS-CoV-2/FLU/RSV testing.  Fact Sheet for Patients: EntrepreneurPulse.com.au  Fact Sheet for Healthcare Providers: IncredibleEmployment.be  This test is not yet approved or cleared by the [place] FDA and has been authorized for detection and/or diagnosis of SARS-CoV-2 by FDA under an Emergency Use Authorization (EUA). This EUA will remain in effect (meaning this test can be used) for the duration of the COVID-19 declaration under Section 564(b)(1) of the Act, 21 U.S.C. section 360bbb-3(b)(1), unless the authorization is terminated or revoked.  Performed at [facility], [place] [address], [place], [place] [address]      Time coordinating discharge: Over 30 minutes  SIGNED:   Little Ishikawa, DO [facility] 06/05/2020, 7:47 AM Pager   If 7PM-7AM, please contact night-coverage [email or web address]

## 2020-06-05 NOTE — Progress Notes (Signed)
Pt arrived to unit via w/c. Pt is alert and oriented, dysarthric, able to make needs know, transferred with RW to recliner with min assist but required cuing. Irrg heart rate, lungs clear, had fall with CVA, has knot to right side upper lateral skull without discoloration, bruising to upper/lower extremities, abrasions to right calf just below knee, and area to mid back that is red in color and is not new. Daughter at bedside, pt in recliner with safety pad intact and alarm on and functioning with call bell in reach.

## 2020-06-05 NOTE — Progress Notes (Signed)
Physical Medicine and Rehabilitation Consult Reason for Consult: Altered mental status with decreased balance and speech difficulties Referring Physician: Dr. Avon Gully     HPI: Katrina Randolph is a 87 y.o. right-handed female with history of atrial fibrillation not on anticoagulation as patient did see [name] in the past for possible Watchman consideration of which patient declined, hypothyroidism, tobacco use.  Per chart review patient lives alone.  1 level home one-step to entry.  Independent with assistive device.  She is independent with homemaking/ADLs and still drives.  Presented 05/30/2020 with aphasia and decrease in balance as well as decreased sensation on her right side of acute onset.  CT/MRI as well as MRA head and neck showed left posterior MCA territory infarct with occlusion of multiple left M2 and M2/M3 MCA branches.  Moderate chronic microvascular ischemic disease.  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  Patient did not receive tPA.  Admission chemistries unremarkable except potassium 3.4 glucose 118.  Echocardiogram with ejection fraction of 55 to 60% no wall motion abnormalities.  Carotid Dopplers with no ICA stenosis.  Currently on aspirin for CVA prophylaxis.  Subcutaneous Lovenox for DVT prophylaxis.  Tolerating a regular diet.  Due to patient decreased functional ability and aphasia physical medicine rehab consult requested. Daughter at bedside.    Review of Systems  Constitutional: Negative for chills and fever.  HENT: Negative for hearing loss.   Eyes: Negative for blurred vision and double vision.  Respiratory: Negative for cough and shortness of breath.   Cardiovascular: Positive for palpitations. Negative for chest pain and leg swelling.  Gastrointestinal: Positive for constipation. Negative for heartburn, nausea and vomiting.  Genitourinary: Negative for dysuria and flank pain.  Musculoskeletal: Positive for joint  pain and myalgias.  Skin: Negative for rash.  Neurological: Positive for speech change and weakness.  All other systems reviewed and are negative.       Past Medical History:  Diagnosis Date  . Atrial fibrillation [facility])           Past Surgical History:  Procedure Laterality Date  . ABDOMINAL HYSTERECTOMY      . BIOPSY   10/19/2019    Procedure: BIOPSY;  Surgeon: [name], MD;  Location: [facility];  Service: Gastroenterology;;  . BREAST LUMPECTOMY      . CHOLECYSTECTOMY      . COLONOSCOPY WITH PROPOFOL N/A 10/19/2019    Procedure: COLONOSCOPY WITH PROPOFOL;  Surgeon: [name], MD;  Location: [facility];  Service: Gastroenterology;  Laterality: N/A;  . ESOPHAGOGASTRODUODENOSCOPY (EGD) WITH PROPOFOL N/A 10/19/2019    Procedure: ESOPHAGOGASTRODUODENOSCOPY (EGD) WITH PROPOFOL;  Surgeon: [name], MD;  Location: [facility];  Service: Gastroenterology;  Laterality: N/A;  . POLYPECTOMY   10/19/2019    Procedure: POLYPECTOMY;  Surgeon: [name], MD;  Location: [facility];  Service: Gastroenterology;;    No family history on file. Social History:  reports that she has quit smoking. Her smoking use included cigarettes. She has never used smokeless tobacco. She reports current alcohol use. She reports that she does not use drugs. Allergies:       Allergies  Allergen Reactions  . Sulfa Antibiotics        "feels bad"          Medications Prior to Admission  Medication Sig Dispense Refill  . esomeprazole (NEXIUM) 20 MG capsule Take 20 mg by mouth daily.      Marland Kitchen  furosemide (LASIX) 20 MG tablet Take 20 mg by mouth daily as needed for fluid.       Marland Kitchen levothyroxine (SYNTHROID) 112 MCG tablet Take 1 tablet (112 mcg total) by mouth every morning. 30 tablet 0  . metoprolol succinate (TOPROL-XL) 50 MG 24 hr tablet Take 1 tablet (50 mg total) by mouth daily. Take with or immediately following a meal. 30 tablet 1  . sertraline (ZOLOFT) 50 MG tablet Take 50 mg by  mouth daily.      . traZODone (DESYREL) 50 MG tablet Take 50 mg by mouth at bedtime as needed for sleep.           Home: Home Living Family/patient expects to be discharged to:: Private residence Living Arrangements: Alone Available Help at Discharge: Family,Available PRN/intermittently Type of Home: House Home Access: Stairs to enter [employer] of Steps: 1 Entrance Stairs-Rails: None Home Layout: One level Bathroom Shower/Tub: Multimedia programmer: Handicapped height [place]: Environmental consultant - 4 wheels,Cane - single point,Shower seat - built in Additional Comments: daughter lives 2 minutes away from patient.   Functional History: Prior Function Level of Independence: Independent with assistive device(s) Comments: pt stands in shower, has grab bars and a bench but doesn't use it. She is independent with homemaking/ADL's and does not alway use rollator to mobilize. Drives, grocery shops Functional Status:  Mobility: Bed Mobility Overal bed mobility: Needs Assistance Bed Mobility: Supine to Sit Supine to sit: Supervision General bed mobility comments: Supervision for safety. With dynamic sitting balance, required min A Transfers Overall transfer level: Needs assistance Equipment used: 2 person hand held assist Transfers: Sit to/from Stand Sit to Stand: Min assist,+2 physical assistance,+2 safety/equipment General transfer comment: Min A for steadying to stand. Ambulation/Gait Ambulation/Gait assistance: Min assist,Mod assist,+2 physical assistance,Max assist Gait Distance (Feet): 20 Feet Assistive device: 1 person hand held assist,None Gait Pattern/deviations: Step-through pattern,Decreased stride length,Staggering right General Gait Details: Pt tended to drag RLE at times and with decreased awareness of where RLE was in space. 2 major LOB to the R noted, one specifically when turning. Requiring mod to max A +2 for stability during LOB. Otherwise requiring  min to mod A for steadying. Gait velocity: Decreased   ADL: ADL Overall ADL's : Needs assistance/impaired Eating/Feeding: Set up,Sitting Grooming: Wash/dry hands,Wash/dry face,Oral care,Brushing hair,Set up,Sitting Upper Body Bathing: Set up,Supervision/ safety,Sitting Lower Body Bathing: Moderate assistance,Sit to/from stand Upper Body Dressing : Minimal assistance,Sitting Lower Body Dressing: Moderate assistance,Sit to/from stand Toilet Transfer: Moderate assistance,+2 for safety/equipment,Ambulation,Comfort height toilet,Grab bars,RW Toileting- Clothing Manipulation and Hygiene: Moderate assistance,Sit to/from stand Functional mobility during ADLs: Moderate assistance,+2 for safety/equipment General ADL Comments: Pt with balance deficits requiring up to mod A to correct   Cognition: Cognition Overall Cognitive Status: Difficult to assess Arousal/Alertness: Awake/alert Orientation Level: Oriented X4 Attention: Sustained,Selective Sustained Attention: Appears intact Selective Attention: Appears intact Memory: Appears intact Safety/Judgment: Appears intact Cognition Arousal/Alertness: Awake/alert Behavior During Therapy: WFL for tasks assessed/performed Overall Cognitive Status: Difficult to assess General Comments: Pt follows commands well Difficult to assess due to: Impaired communication   Blood pressure (!) 143/88, pulse 86, temperature 98.6 F (37 C), temperature source Oral, resp. rate 16, height 5\' 2"  (1.575 m), weight 76.8 kg, SpO2 94 %. Gen: no distress, normal appearing HEENT: oral mucosa pink and moist, NCAT Cardio: Irregularly irregular Chest: normal effort, normal rate of breathing Abd: soft, non-distended Ext: no edema Psych: pleasant, normal affect Skin: intact Neurological:     Comments: Patient is alert no acute  distress.  Makes eye contact with examiner.  Appropriate for simple 1 and 2 word sentences for person and age.  She did have some difficulty with  phrases.  Follows simple commands. Expressive aphasia with word finding difficulties. 5/5 strength throughout.     Lab Results Last 24 Hours       Results for orders placed or performed during the hospital encounter of 05/30/20 (from the past 24 hour(s))  CBC     Status: Abnormal    Collection Time: 06/01/20  1:12 AM  Result Value Ref Range    WBC 6.9 4.0 - 10.5 K/uL    RBC 4.19 3.87 - 5.11 MIL/uL    Hemoglobin 11.5 (L) 12.0 - 15.0 g/dL    HCT 36.7 36.0 - 46.0 %    MCV 87.6 80.0 - 100.0 fL    MCH 27.4 26.0 - 34.0 pg    MCHC 31.3 30.0 - 36.0 g/dL    RDW 15.9 (H) 11.5 - 15.5 %    Platelets 175 150 - 400 K/uL    nRBC 0.0 0.0 - 0.2 %  Basic metabolic panel     Status: Abnormal    Collection Time: 06/01/20  1:12 AM  Result Value Ref Range    Sodium 140 135 - 145 mmol/L    Potassium 3.8 3.5 - 5.1 mmol/L    Chloride 109 98 - 111 mmol/L    CO2 23 22 - 32 mmol/L    Glucose, Bld 115 (H) 70 - 99 mg/dL    BUN 11 8 - 23 mg/dL    Creatinine, Ser 1.02 (H) 0.44 - 1.00 mg/dL    Calcium 8.7 (L) 8.9 - 10.3 mg/dL    GFR, Estimated 53 (L) >60 mL/min    Anion gap 8 5 - 15       Imaging Results (Last 48 hours)  DG Chest 2 View   Result Date: 05/31/2020 CLINICAL DATA:  TIA EXAM: CHEST - 2 VIEW COMPARISON:  Radiograph 10/12/2018 FINDINGS: Diffuse pulmonary vascular congestion with hazy and streaky opacities in the mid to lower lungs and more patchy, coalescent opacity in the retrocardiac space. No pneumothorax or visible effusion. Biapical pleuroparenchymal scarring. Stable cardiomegaly with a calcified, tortuous aorta. The osseous structures appear diffusely demineralized which may limit detection of small or nondisplaced fractures. No acute osseous or soft tissue abnormality. Degenerative changes are present in the imaged spine and shoulders. Telemetry leads overlie the chest. IMPRESSION: Diffuse pulmonary vascular congestion with hazy and streaky opacities in the mid to lower lungs and more patchy,  coalescent opacity in the retrocardiac space. Findings may reflect some developing edema and/or atelectasis. Infection less favored though not fully excluded in the appropriate clinical context. Electronically Signed   By: Lovena Le M.D.   On: 05/31/2020 00:11    CT Head Wo Contrast   Result Date: 05/30/2020 CLINICAL DATA:  Altered mental status, last known normal 12 hours ago EXAM: CT HEAD WITHOUT CONTRAST TECHNIQUE: Contiguous axial images were obtained from the base of the skull through the vertex without intravenous contrast. COMPARISON:  None. FINDINGS: Brain: No evidence of acute large vascular territory infarction, hemorrhage, hydrocephalus, extra-axial collection or mass lesion/mass effect. Age related global parenchymal volume loss with ex vacuo dilatation of ventricular system and prominent extra-axial spaces. There are patchy subcortical and periventricular white matter hypodensities which are nonspecific but favored represent sequela of chronic ischemic small vessel white matter disease. Vascular: No hyperdense vessel. Atherosclerotic calcifications of the intracranial portions of the internal  carotid and vertebral arteries. Skull: Hyperostosis frontalis. Negative for fracture or focal lesion. Sinuses/Orbits: The paranasal sinuses and mastoid air cells are predominantly clear. Pneumatized petrous apices. Orbits are grossly unremarkable. Other: None IMPRESSION: 1. No acute intracranial findings. 2. Age related global parenchymal volume loss and chronic ischemic small vessel white matter disease. Electronically Signed   By: Dahlia Bailiff MD   On: 05/30/2020 14:34    MR ANGIO HEAD WO CONTRAST   Result Date: 05/30/2020 CLINICAL DATA:  Neuro deficit, acute stroke suspected. EXAM: MRI HEAD WITHOUT CONTRAST MRA HEAD WITHOUT CONTRAST TECHNIQUE: Multiplanar, multiecho pulse sequences of the brain and surrounding structures were obtained without intravenous contrast. Angiographic images of the head  were obtained using MRA technique without contrast. COMPARISON:  None. FINDINGS: MRI HEAD FINDINGS Brain: Multiple acute infarcts in the left MCA territory, including acute infarct of the posterior left insula, left periventricular frontal lobe and left frontoparietal cortex superiorly. There is T2/FLAIR hyperintense mild edema associated with these infarcts. No substantial mass effect. No midline shift. Basal cisterns are patent. No evidence of acute hemorrhage. No extra-axial fluid collections. Additional moderate T2/FLAIR hyperintensities within the white matter and pons, nonspecific but most likely related to chronic microvascular ischemic disease. Frontal predominant cerebral volume loss with associated prominence of the overlying extra-axial spaces. Vascular: Major arterial flow voids are maintained at the skull base. Skull and upper cervical spine: Normal marrow signal. Sinuses/Orbits: Clear sinuses.  Unremarkable orbits. Other: No sizable mastoid effusions. MRA HEAD FINDINGS Anterior circulation: Bilateral ICAs are patent through the carotid terminus. Bilateral M1 MCAs are patent. Left proximal M2 MCA inferior division is poorly visualized, but within an area of artifact. There is occlusion of two other proximal left M2 MCA branches in the left sylvian fissure (see series 24, images 41 and 45). Additionally, there is suspected occlusion of a more distal left M2/M3 MCA branch (series 24, image 30). Smaller left A1 ACA, likely congenital. Otherwise, patent ACAs. Possible small (approximately 2 mm) outpouching in the region of the right ICA terminus. Posterior circulation: Patent intradural vertebral arteries and basilar artery. Right fetal type PCA. Patent proximal posterior cerebral arteries with suspected mild stenosis of the right P2 PCA. IMPRESSION: 1. Left posterior MCA territory acute infarcts with occlusion of multiple left M2 and M2/M3 MCA branches, as described above. Associated edema without  substantial mass effect. 2. Moderate chronic microvascular ischemic disease. 3. Frontal predominant cerebral volume loss. 4. Possible small (approximately 2 mm) aneurysm in the region of the right ICA terminus. A CTA could further characterize if clinically indicated Critical/urgent findings discussed with Dr. Rory Percy at 6:14 p.m via telephone. Electronically Signed   By: Margaretha Sheffield MD   On: 05/30/2020 18:43    MR BRAIN WO CONTRAST   Result Date: 05/30/2020 CLINICAL DATA:  Neuro deficit, acute stroke suspected. EXAM: MRI HEAD WITHOUT CONTRAST MRA HEAD WITHOUT CONTRAST TECHNIQUE: Multiplanar, multiecho pulse sequences of the brain and surrounding structures were obtained without intravenous contrast. Angiographic images of the head were obtained using MRA technique without contrast. COMPARISON:  None. FINDINGS: MRI HEAD FINDINGS Brain: Multiple acute infarcts in the left MCA territory, including acute infarct of the posterior left insula, left periventricular frontal lobe and left frontoparietal cortex superiorly. There is T2/FLAIR hyperintense mild edema associated with these infarcts. No substantial mass effect. No midline shift. Basal cisterns are patent. No evidence of acute hemorrhage. No extra-axial fluid collections. Additional moderate T2/FLAIR hyperintensities within the white matter and pons, nonspecific but most likely related to  chronic microvascular ischemic disease. Frontal predominant cerebral volume loss with associated prominence of the overlying extra-axial spaces. Vascular: Major arterial flow voids are maintained at the skull base. Skull and upper cervical spine: Normal marrow signal. Sinuses/Orbits: Clear sinuses.  Unremarkable orbits. Other: No sizable mastoid effusions. MRA HEAD FINDINGS Anterior circulation: Bilateral ICAs are patent through the carotid terminus. Bilateral M1 MCAs are patent. Left proximal M2 MCA inferior division is poorly visualized, but within an area of artifact.  There is occlusion of two other proximal left M2 MCA branches in the left sylvian fissure (see series 24, images 41 and 45). Additionally, there is suspected occlusion of a more distal left M2/M3 MCA branch (series 24, image 30). Smaller left A1 ACA, likely congenital. Otherwise, patent ACAs. Possible small (approximately 2 mm) outpouching in the region of the right ICA terminus. Posterior circulation: Patent intradural vertebral arteries and basilar artery. Right fetal type PCA. Patent proximal posterior cerebral arteries with suspected mild stenosis of the right P2 PCA. IMPRESSION: 1. Left posterior MCA territory acute infarcts with occlusion of multiple left M2 and M2/M3 MCA branches, as described above. Associated edema without substantial mass effect. 2. Moderate chronic microvascular ischemic disease. 3. Frontal predominant cerebral volume loss. 4. Possible small (approximately 2 mm) aneurysm in the region of the right ICA terminus. A CTA could further characterize if clinically indicated Critical/urgent findings discussed with Dr. Rory Percy at 6:14 p.m via telephone. Electronically Signed   By: Margaretha Sheffield MD   On: 05/30/2020 18:43    ECHOCARDIOGRAM COMPLETE   Result Date: 05/31/2020    ECHOCARDIOGRAM REPORT   Patient Name:   LINDLEY LANZI Date of Exam: 05/31/2020 Medical Rec #:  [id number]             Height:       62.0 in Accession #:    [id number]            Weight:       169.3 lb Date of Birth:  01-08-1931             BSA:          1.781 m Patient Age:    19 years              BP:           129/77 mmHg Patient Gender: F                     HR:           103 bpm. Exam Location:  Inpatient Procedure: 2D Echo, Cardiac Doppler and Color Doppler Indications:    Stroke I63.9  History:        Patient has prior history of Echocardiogram examinations, most                 recent 10/13/2019. Arrythmias:Atrial Fibrillation.  Sonographer:    Tiffany Dance Referring Phys: [id number] [place]  1. Left ventricular ejection fraction, by estimation, is 55 to 60%. The left ventricle has normal function. The left ventricle has no regional wall motion abnormalities. Left ventricular diastolic function could not be evaluated.  2. Right ventricular systolic function is moderately reduced. The right ventricular size is mildly enlarged. There is severely elevated pulmonary artery systolic pressure. The estimated right ventricular systolic pressure is 123XX123 mmHg.  3. Left atrial size was severely dilated.  4. Right atrial size was severely dilated.  5. The pericardial effusion is posterior to the left ventricle.  6. The mitral valve is normal in structure. Mild mitral valve regurgitation. No evidence of mitral stenosis.  7. Tricuspid valve regurgitation is mild to moderate.  8. The aortic valve is normal in structure. Aortic valve regurgitation is not visualized. No aortic stenosis is present.  9. The inferior vena cava is dilated in size with >50% respiratory variability, suggesting right atrial pressure of 8 mmHg. FINDINGS  Left Ventricle: Left ventricular ejection fraction, by estimation, is 55 to 60%. The left ventricle has normal function. The left ventricle has no regional wall motion abnormalities. The left ventricular internal cavity size was normal in size. There is  no left ventricular hypertrophy. Left ventricular diastolic function could not be evaluated due to atrial fibrillation. Left ventricular diastolic function could not be evaluated. Right Ventricle: The right ventricular size is mildly enlarged. No increase in right ventricular wall thickness. Right ventricular systolic function is moderately reduced. There is severely elevated pulmonary artery systolic pressure. The tricuspid regurgitant velocity is 3.69 m/s, and with an assumed right atrial pressure of 8 mmHg, the estimated right ventricular systolic pressure is 123XX123 mmHg. Left Atrium: Left atrial size was severely dilated. Right  Atrium: Right atrial size was severely dilated. Pericardium: Trivial pericardial effusion is present. The pericardial effusion is posterior to the left ventricle. Mitral Valve: The mitral valve is normal in structure. Mild mitral valve regurgitation. No evidence of mitral valve stenosis. Tricuspid Valve: The tricuspid valve is normal in structure. Tricuspid valve regurgitation is mild to moderate. No evidence of tricuspid stenosis. Aortic Valve: The aortic valve is normal in structure. Aortic valve regurgitation is not visualized. No aortic stenosis is present. Pulmonic Valve: The pulmonic valve was normal in structure. Pulmonic valve regurgitation is trivial. No evidence of pulmonic stenosis. Aorta: The aortic root is normal in size and structure. Venous: The inferior vena cava is dilated in size with greater than 50% respiratory variability, suggesting right atrial pressure of 8 mmHg. IAS/Shunts: The interatrial septum appears to be lipomatous. No atrial level shunt detected by color flow Doppler.  LEFT VENTRICLE PLAX 2D LVIDd:         4.20 cm LVIDs:         3.30 cm LV PW:         1.10 cm LV IVS:        1.10 cm LVOT diam:     1.80 cm LV SV:         33 LV SV Index:   18 LVOT Area:     2.54 cm  RIGHT VENTRICLE          IVC RV Basal diam:  3.60 cm  IVC diam: 2.60 cm RV Mid diam:    2.60 cm TAPSE (M-mode): 1.3 cm LEFT ATRIUM             Index       RIGHT ATRIUM           Index LA diam:        5.70 cm 3.20 cm/m  RA Area:     30.60 cm LA Vol (A2C):   93.1 ml 52.27 ml/m RA Volume:   110.00 ml 61.76 ml/m LA Vol (A4C):   81.0 ml 45.48 ml/m LA Biplane Vol: 87.7 ml 49.24 ml/m  AORTIC VALVE LVOT Vmax:   69.45 cm/s LVOT Vmean:  45.100 cm/s LVOT VTI:    0.129 m  AORTA Ao Root diam: 3.00 cm Ao Asc diam:  3.00 cm MITRAL VALVE  TRICUSPID VALVE MV Area (PHT): 3.27 cm     TR Peak grad:   54.5 mmHg MV Decel Time: 232 msec     TR Vmax:        369.00 cm/s MV E velocity: 112.00 cm/s MV A velocity: 41.20 cm/s    SHUNTS MV E/A ratio:  2.72         Systemic VTI:  0.13 m                             Systemic Diam: 1.80 cm Fransico Him MD Electronically signed by Fransico Him MD Signature Date/Time: 05/31/2020/3:43:30 PM    Final     VAS US CAROTID   Result Date: 05/31/2020 Carotid Arterial Duplex Study Indications: CVA. Performing Technologist: Abram Sander RVS  Examination Guidelines: A complete evaluation includes B-mode imaging, spectral Doppler, color Doppler, and power Doppler as needed of all accessible portions of each vessel. Bilateral testing is considered an integral part of a complete examination. Limited examinations for reoccurring indications may be performed as noted.  Right Carotid Findings: +----------+--------+--------+--------+------------------+--------+           PSV cm/sEDV cm/sStenosisPlaque DescriptionComments +----------+--------+--------+--------+------------------+--------+ CCA Prox  41      9               heterogenous               +----------+--------+--------+--------+------------------+--------+ CCA Distal41      10              heterogenous               +----------+--------+--------+--------+------------------+--------+ ICA Prox  73      18      1-39%   heterogenous               +----------+--------+--------+--------+------------------+--------+ ICA Distal93      24                                         +----------+--------+--------+--------+------------------+--------+ ECA       90                                                 +----------+--------+--------+--------+------------------+--------+ +----------+--------+-------+--------+-------------------+           PSV cm/sEDV cmsDescribeArm Pressure (mmHG) +----------+--------+-------+--------+-------------------+ [id number]                                         +----------+--------+-------+--------+-------------------+ +---------+--------+--+--------+--+---------+ VertebralPSV  cm/s66EDV cm/s10Antegrade +---------+--------+--+--------+--+---------+  Left Carotid Findings: +----------+--------+--------+--------+------------------+--------+           PSV cm/sEDV cm/sStenosisPlaque DescriptionComments +----------+--------+--------+--------+------------------+--------+ CCA Prox  48      7               heterogenous               +----------+--------+--------+--------+------------------+--------+ CCA Distal33      5               heterogenous               +----------+--------+--------+--------+------------------+--------+ ICA Prox  102     22  1-39%   heterogenous               +----------+--------+--------+--------+------------------+--------+ ICA Distal62      19                                         +----------+--------+--------+--------+------------------+--------+ ECA       71      8                                          +----------+--------+--------+--------+------------------+--------+ +----------+--------+--------+--------+-------------------+           PSV cm/sEDV cm/sDescribeArm Pressure (mmHG) +----------+--------+--------+--------+-------------------+ [id number]                                          +----------+--------+--------+--------+-------------------+ +---------+--------+--+--------+--+---------+ VertebralPSV cm/s39EDV cm/s13Antegrade +---------+--------+--+--------+--+---------+   Summary: Right Carotid: Velocities in the right ICA are consistent with a 1-39% stenosis. Left Carotid: Velocities in the left ICA are consistent with a 1-39% stenosis. Vertebrals: Bilateral vertebral arteries demonstrate antegrade flow. *See table(s) above for measurements and observations.  Electronically signed by Jamelle Haring on 05/31/2020 at 4:30:40 PM.    Final          Assessment/Plan: Diagnosis: Left MCA CVA 1. Does the need for close, 24 hr/day medical supervision in concert with the patient's rehab  needs make it unreasonable for this patient to be served in a less intensive setting? Yes 2. Co-Morbidities requiring supervision/potential complications:  1. Atrial fibrillation: monitor HR TID 2. Recurrent symptomatic anemia: monitor Hgb 3. Hyperlipidemia 4. Iron deficiency anemia. 5. Orthostatic hypotension: monitor BP TID 3. Due to bladder management, bowel management, safety, skin/wound care, disease management, medication administration, pain management and patient education, does the patient require 24 hr/day rehab nursing? Yes 4. Does the patient require coordinated care of a physician, rehab nurse, therapy disciplines of PT, OT, SLP to address physical and functional deficits in the context of the above medical diagnosis(es)? Yes Addressing deficits in the following areas: balance, endurance, locomotion, strength, transferring, bowel/bladder control, bathing, dressing, feeding, grooming, toileting, speech and psychosocial support, language 5. Can the patient actively participate in an intensive therapy program of at least 3 hrs of therapy per day at least 5 days per week? Yes 6. The potential for patient to make measurable gains while on inpatient rehab is excellent 7. Anticipated functional outcomes upon discharge from inpatient rehab are supervision  with PT, supervision with OT, supervision with SLP. 8. Estimated rehab length of stay to reach the above functional goals is: 10-14 days 9. Anticipated discharge destination: Home 10. Overall Rehab/Functional Prognosis: excellent   RECOMMENDATIONS: This patient's condition is appropriate for continued rehabilitative care in the following setting: CIR Patient has agreed to participate in recommended program. Yes Note that insurance prior authorization may be required for reimbursement for recommended care.   Comment: Thank you for this consult. Admission coordinator to follow.    I have personally performed a face to face diagnostic  evaluation, including, but not limited to relevant history and physical exam findings, of this patient and developed relevant assessment and plan.  Additionally, I have reviewed and concur with the physician assistant's documentation above.   Leeroy Cha, MD  Lavon Paganini Angiulli, PA-C 06/01/2020

## 2020-06-05 NOTE — Progress Notes (Signed)
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 Will not test pt again since unrelated to wound.

## 2020-06-05 NOTE — Progress Notes (Signed)
PMR Admission Coordinator Pre-Admission Assessment   Patient: Katrina Randolph is an 87 y.o., female MRN: [id number] DOB: [birth year] Height: 5' 2" (157.5 cm) (pt stated) Weight: 76.8 kg                                                                                                                                                  Insurance Information HMO:      PPO: Yes     PCP:       IPA:       80/20:       OTHER:  Group [id number] PRIMARY: [employer] Medicare      Policy#: [id number]      Subscriber: patient CM Name: n/a      Phone#: [phone]     Fax#: [phone] [place] with [employer] called 06/02/20  Gave approval for 7  days,  4/22-4/28  fax clinicals to [phone]  Pre-Cert#: [id number]      Employer: Retired Benefits:  Phone #: [phone]     Name: online [email or web address] Eff. Date: 02/12/20     Deduct: $0      Out of Pocket Max: $500 (met $70)      Life Max: N/A  CIR: $25 admission copay      SNF: 100% coverage for maximum 100 days Outpatient: 100%     Co-Pay: none Home Health: 100%      Co-Pay: none DME: 80%     Co-Pay: 20% Providers: in network  SECONDARY:       Policy#:       Phone#:    Development worker, community:       Phone#:    The Engineer, petroleum" for patients in Inpatient Rehabilitation Facilities with attached "Privacy Act [place] Records" was provided and verbally reviewed with: Patient and Family   Emergency Contact Information         Contact Information     Name Relation Home Work [place] Daughter [phone]   [phone]       Current Medical History  Patient Admitting Diagnosis: CVA   History of Present Illness: : Katrina Randolph is an 87 year old right-handed female with history of atrial fibrillation not on anticoagulation as patient did see [name] in the past for possible watchman's consideration of which patient declined, hypothyroidism, tobacco use.  Presented 05/30/2020 with aphasia and  decrease in balance as well as decreased sensation on her right side of acute onset.  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  Moderate chronic microvascular ischemic disease.  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  Patient did not receive tPA.  Admission chemistries unremarkable except potassium 3.4 glucose 118.  Echocardiogram with ejection fraction  of 55 to 60% no wall motion abnormalities.  Carotid Dopplers with no ICA stenosis.  Initially maintained on aspirin for CVA prophylaxis and changed to Eliquis in light of history of atrial fibrillation.    Tolerating a regular consistency diet.  Due to patient's decreased functional ability aphasia was recommended for CIR.  Planned for admission 4/23, but held due to afib with rvr.  Pt currently in NSR and ready for admission to CIR.      Complete NIHSS TOTAL: 1 Glasgow Coma Scale Score: (!) 20   Past Medical History      Past Medical History:  Diagnosis Date  . Atrial fibrillation ([place])        Family History  family history is not on file.   Prior Rehab/Hospitalizations:  Has the patient had prior rehab or hospitalizations prior to admission? Yes   Has the patient had major surgery during 100 days prior to admission? No   Current Medications    Current Facility-Administered Medications:  .  acetaminophen (TYLENOL) tablet 650 mg, 650 mg, Oral, Q4H PRN, 650 mg at 06/04/20 2204 **OR** acetaminophen (TYLENOL) 160 MG/5ML solution 650 mg, 650 mg, Per Tube, Q4H PRN, 650 mg at 06/03/20 0511 **OR** acetaminophen (TYLENOL) suppository 650 mg, 650 mg, Rectal, Q4H PRN, Hugelmeyer, Alexis, DO .  apixaban (ELIQUIS) tablet 5 mg, 5 mg, Oral, BID, Little Ishikawa, MD, 5 mg at 06/05/20 0858 .  atorvastatin (LIPITOR) tablet 20 mg, 20 mg, Oral, Daily, Rosalin Hawking, MD, 20 mg at 06/05/20 0858 .  levothyroxine (SYNTHROID)  tablet 112 mcg, 112 mcg, Oral, q morning, Hugelmeyer, Alexis, DO, 112 mcg at 06/05/20 0523 .  metoprolol succinate (TOPROL-XL) 24 hr tablet 25 mg, 25 mg, Oral, Daily, Little Ishikawa, MD, 25 mg at 06/05/20 0858 .  pantoprazole (PROTONIX) EC tablet 40 mg, 40 mg, Oral, Daily, Hugelmeyer, Alexis, DO, 40 mg at 06/05/20 0857 .  senna-docusate (Senokot-S) tablet 1 tablet, 1 tablet, Oral, QHS PRN, Hugelmeyer, Alexis, DO .  sertraline (ZOLOFT) tablet 50 mg, 50 mg, Oral, Daily, Hugelmeyer, Alexis, DO, 50 mg at 06/05/20 9417   Patients Current Diet:     Diet Order                      Diet - low sodium heart healthy              Diet Heart Room service appropriate? Yes; Fluid consistency: Thin  Diet effective now                      Precautions / Restrictions Precautions Precautions: Fall Restrictions Weight Bearing Restrictions: No    Has the patient had 2 or more falls or a fall with injury in the past year?Yes   Prior Activity Level Limited Community (1-2x/wk): Went out 2-3 times a week   Prior Functional Level Prior Function Level of Independence: Independent with assistive device(s) Comments: pt stands in shower, has grab bars and a bench but doesn't use it. She is independent with homemaking/ADL's and does not alway use rollator to mobilize. Drives, grocery shops   Self Care: Did the patient need help bathing, dressing, using the toilet or eating?  Independent   Indoor Mobility: Did the patient need assistance with walking from room to room (with or without device)? Independent   Stairs: Did the patient need assistance with internal or external stairs (with or without device)? Independent   Functional Cognition: Did the patient need help planning  regular tasks such as shopping or remembering to take medications? [place] Devices/Equipment: Cane (specify quad or straight),Walker (specify type),Built-in shower seat  (single point cane, 4 wheeled walker, walk-in shower) Home Equipment: Walker - 4 wheels,Cane - single point,Shower seat - built in   Prior Device Use: Indicate devices/aids used by the patient prior to current illness, exacerbation or injury? Walker and [employer]   Current Functional Level Cognition   Arousal/Alertness: Awake/alert Overall Cognitive Status: Impaired/Different from baseline Difficult to assess due to: Impaired communication (expressive difficulties) Orientation Level: Oriented X4 Following Commands: Follows one step commands with increased time Safety/Judgement: Decreased awareness of safety,Decreased awareness of deficits General Comments: R inattention, difficulty following multistep mobility commands at times or follows in a delayed manner. Attention: Sustained,Selective Sustained Attention: Appears intact Selective Attention: Appears intact Memory: Appears intact Safety/Judgment: Appears intact    Extremity Assessment (includes Sensation/Coordination)   Upper Extremity Assessment: Defer to OT evaluation RUE Deficits / Details: mild weakness and mild sensory deficits Rt UE RUE Sensation: decreased proprioception RUE Coordination: decreased fine motor  Lower Extremity Assessment: RLE deficits/detail RLE Deficits / Details: Mild weakness noted, however, could not tell where PT was touching when performing sensory tests. Decreased proprioception and had difficulty telling where RLE was in space during mobility RLE Sensation: decreased proprioception,decreased light touch RLE Coordination: decreased gross motor,decreased fine motor     ADLs   Overall ADL's : Needs assistance/impaired Eating/Feeding: Set up,Sitting Grooming: Wash/dry hands,Wash/dry face,Oral care,Min guard,Standing,Cueing for safety,Cueing for sequencing Upper Body Bathing: Set up,Supervision/ safety,Sitting Lower Body Bathing: Moderate assistance,Sit to/from stand Upper Body Dressing : Min  guard,Sitting,Cueing for sequencing,Cueing for safety Lower Body Dressing: Minimal assistance,Sitting/lateral leans Lower Body Dressing Details (indicate cue type and reason): min A to manage socks Toilet Transfer: Ambulation,Comfort height toilet,Grab bars,RW,Minimal assistance,Cueing for safety Toileting- Clothing Manipulation and Hygiene: Minimal assistance,Sit to/from stand Functional mobility during ADLs: Minimal assistance,Cueing for safety,Cueing for sequencing,Rolling walker General ADL Comments: Pt with balance deficits requiring up to mod A to correct     Mobility   Overal bed mobility: Needs Assistance Bed Mobility: Supine to Sit Supine to sit: Min guard,HOB elevated General bed mobility comments: pt up in chair upon PT arrival to room, remains in chair at PT exit     Transfers   Overall transfer level: Needs assistance Equipment used: Rolling walker (2 wheeled) Transfers: Sit to/from Stand Sit to Stand: Min assist General transfer comment: min assist to steady upon standing, STS x3 (from recliner x2 and chair in hallway x1). Verbal cuing for hand placement when rising/lowering, followed inconsistently due to increased processing time.     Ambulation / Gait / Stairs / Wheelchair Mobility   Ambulation/Gait Ambulation/Gait assistance: Min assist,Mod Web designer (Feet): 50 Feet (x2 - seated rest break to recover tachycardia, fatigue) Assistive device: Rolling walker (2 wheeled),1 person hand held assist Gait Pattern/deviations: Step-through pattern,Decreased stride length,Staggering right,Narrow base of support General Gait Details: min assist with RW for steadying, guiding pt and RW trajectory, cuing for upright posture and placement within RW. Seated rest due to HR to150s bpm in afib rhythm during gait. 2nd bout of gait with R HHA and mod assist to steady, correct staggering towards R. verbal cuing for widening BOS, demonstrating improved heel-toe gait without RW but  increased unsteadiness. Gait velocity: decr     Posture / Balance Dynamic Sitting Balance Sitting balance - Comments: Required min A for dynamic balance. Balance Overall balance assessment:  Needs assistance Sitting-balance support: No upper extremity supported,Feet supported Sitting balance-Leahy Scale: Fair Sitting balance - Comments: Required min A for dynamic balance. Standing balance support: No upper extremity supported,During functional activity Standing balance-Leahy Scale: Poor Standing balance comment: reliant on external support     Special needs/care consideration  None        Previous Home Environment (from acute therapy documentation) Living Arrangements: Alone Available Help at Discharge: Family,Available PRN/intermittently Type of Home: House Home Layout: One level Home Access: Stairs to enter Entrance Stairs-Rails: None Entrance Stairs-Number of Steps: 1 Bathroom Shower/Tub: Multimedia programmer: Handicapped height Home Care Services: No Additional Comments: daughter lives 2 minutes away from patient.    Discharge Living Setting Plans for Discharge Living Setting: Patient's home Type of Home at Discharge: Other (Comment) ([place]) Discharge Home Layout: One level Discharge Home Access: Stairs to enter Entrance Stairs-Rails: None Entrance Stairs-Number of Steps: 1 from garage Discharge Bathroom Shower/Tub: Walk-in shower,Door Discharge Bathroom Toilet: Handicapped height Discharge Bathroom Accessibility: Yes How Accessible: Accessible via wheelchair,Accessible via walker Does the patient have any problems obtaining your medications?: No   Social/Family/Support Systems Patient Roles: Parent (Has 2 daughters) Contact Information: Amelda Hapke Anticipated Caregiver: Daughters Ability/Limitations of Caregiver: One daughter lives 2-3 blocks away; another daughter here from [place] for extended time to help Caregiver Availability:  24/7 Discharge Plan Discussed with Primary Caregiver: Yes Is Caregiver In Agreement with Plan?: Yes Does Caregiver/Family have Issues with Lodging/Transportation while Pt is in Rehab?: No   Goals Patient/Family Goal for Rehab: PT/OT supervision to mod I goals Expected length of stay: 7-10 days Cultural Considerations: None Pt/Family Agrees to Admission and willing to participate: Yes Program Orientation Provided & Reviewed with Pt/Caregiver Including Roles  & Responsibilities: Yes   Decrease burden of Care through IP rehab admission: N/A   Possible need for SNF placement upon discharge: Not anticipated     Patient Condition: This patient's medical and functional status has changed since the consult dated: 05/31/20 in which the Rehabilitation Physician determined and documented that the patient's condition is appropriate for intensive rehabilitative care in an inpatient rehabilitation facility. See "History of Present Illness" (above) for medical update. Functional changes are: Pt.ambulating with min-mod A, transferring at min A level.. Patient's medical and functional status update has been discussed with the Rehabilitation physician and patient remains appropriate for inpatient rehabilitation. Will admit to inpatient rehab today.   Preadmission Screen Completed By:  Michel Santee, PT, 06/05/2020 10:44 AM ______________________________________________________________________   Discussed status with Dr. Ranell Patrick on 06/05/2020  at 1500  and received approval for admission today.   Admission Coordinator:  Michel Santee, PT, DPT with updated by Clemens Catholic  Time 1620 Sudie Grumbling 06/02/2020             Cosigned by: Izora Ribas, MD at 06/05/2020 10:57 AM

## 2020-06-05 NOTE — Discharge Instructions (Addendum)
Inpatient Rehab Discharge Instructions  [place] Discharge date and time: No discharge date for patient encounter.   Activities/Precautions/ Functional Status: Activity: activity as tolerated Diet: regular diet Wound Care: Routine skin checks Functional status:  ___ No restrictions     ___ Walk up steps independently ___ 24/7 supervision/assistance   ___ Walk up steps with assistance ___ Intermittent supervision/assistance  ___ Bathe/dress independently ___ Walk with walker     _x__ Bathe/dress with assistance ___ Walk Independently    ___ Shower independently ___ Walk with assistance    ___ Shower with assistance ___ No alcohol     ___ Return to work/school ________  Special Instructions: No driving smoking or alcohol    COMMUNITY REFERRALS UPON DISCHARGE:   Outpatient: PT & SP             Agency:[facility] Phone:[phone]              Appointment Date/Time:WILL CONTACT DAUGHTER TO ARRANGE APPOINTMENTS  Medical Equipment/Items Ordered:                                                 Agency/Supplier:    STROKE/TIA DISCHARGE INSTRUCTIONS SMOKING Cigarette smoking nearly doubles your risk of having a stroke & is the single most alterable risk factor  If you smoke or have smoked in the last 12 months, you are advised to quit smoking for your health.  Most of the excess cardiovascular risk related to smoking disappears within a year of stopping.  Ask you doctor about anti-smoking medications  [place] Quit Line: 1-800-QUIT NOW  Free Smoking Cessation Classes [phone]  CHOLESTEROL Know your levels; limit fat & cholesterol in your diet  Lipid Panel     Component Value Date/Time   CHOL 171 05/31/2020 0022   TRIG 69 05/31/2020 0022   HDL 54 05/31/2020 0022   CHOLHDL 3.2 05/31/2020 0022   VLDL 14 05/31/2020 0022   LDLCALC 103 (H) 05/31/2020 0022      Many patients benefit from treatment even if their cholesterol is at goal.  Goal:  Total Cholesterol (CHOL) less than 160  Goal:  Triglycerides (TRIG) less than 150  Goal:  HDL greater than 40  Goal:  LDL (LDLCALC) less than 100   BLOOD PRESSURE American Stroke Association blood pressure target is less that 120/80 mm/Hg  Your discharge blood pressure is:  BP: (!) 127/94  Monitor your blood pressure  Limit your salt and alcohol intake  Many individuals will require more than one medication for high blood pressure  DIABETES (A1c is a blood sugar average for last 3 months) Goal HGBA1c is under 7% (HBGA1c is blood sugar average for last 3 months)  Diabetes:     Lab Results  Component Value Date   HGBA1C 6.2 (H) 05/31/2020     Your HGBA1c can be lowered with medications, healthy diet, and exercise.  Check your blood sugar as directed by your physician  Call your physician if you experience unexplained or low blood sugars.  PHYSICAL ACTIVITY/REHABILITATION Goal is 30 minutes at least 4 days per week  Activity: Increase activity slowly, Therapies: Physical Therapy: Home Health Return to work:   Activity decreases your risk of heart attack and stroke and makes your heart stronger.  It helps control your weight and blood pressure; helps you relax and can  improve your mood.  Participate in a regular exercise program.  Talk with your doctor about the best form of exercise for you (dancing, walking, swimming, cycling).  DIET/WEIGHT Goal is to maintain a healthy weight  Your discharge diet is:  Diet Order            Diet Heart Room service appropriate? Yes; Fluid consistency: Thin  Diet effective now                 liquids Your height is:  Height: 5\' 2"  (157.5 cm) Your current weight is: Weight: 74.7 kg Your Body Mass Index (BMI) is:  BMI (Calculated): 30.11  Following the type of diet specifically designed for you will help prevent another stroke.  Your goal weight range is:    Your goal Body Mass Index (BMI) is 19-24.  Healthy food habits can help  reduce 3 risk factors for stroke:  High cholesterol, hypertension, and excess weight.  RESOURCES Stroke/Support Group:  Call [phone]   STROKE EDUCATION PROVIDED/REVIEWED AND GIVEN TO PATIENT Stroke warning signs and symptoms How to activate emergency medical system (call 911). Medications prescribed at discharge. Need for follow-up after discharge. Personal risk factors for stroke. Pneumonia vaccine given:  Flu vaccine given:  My questions have been answered, the writing is legible, and I understand these instructions.  I will adhere to these goals & educational materials that have been provided to me after my discharge from the hospital.      My questions have been answered and I understand these instructions. I will adhere to these goals and the provided educational materials after my discharge from the hospital.  Patient/Caregiver Signature _______________________________ Date __________  Clinician Signature _______________________________________ Date __________  Please bring this form and your medication list with you to all your follow-up doctor's appointments. Information on my medicine - ELIQUIS (apixaban)  This medication education was reviewed with me or my healthcare representative as part of my discharge preparation.   Why was Eliquis prescribed for you? Eliquis was prescribed for you to reduce the risk of a blood clot forming that can cause a stroke if you have a medical condition called atrial fibrillation (a type of irregular heartbeat).  What do You need to know about Eliquis ? Take your Eliquis TWICE DAILY - one tablet in the morning and one tablet in the evening with or without food. If you have difficulty swallowing the tablet whole please discuss with your pharmacist how to take the medication safely.  Take Eliquis exactly as prescribed by your doctor and DO NOT stop taking Eliquis without talking to the doctor who prescribed the medication.  Stopping  may increase your risk of developing a stroke.  Refill your prescription before you run out.  After discharge, you should have regular check-up appointments with your healthcare provider that is prescribing your Eliquis.  In the future your dose may need to be changed if your kidney function or weight changes by a significant amount or as you get older.  What do you do if you miss a dose? If you miss a dose, take it as soon as you remember on the same day and resume taking twice daily.  Do not take more than one dose of ELIQUIS at the same time to make up a missed dose.  Important Safety Information A possible side effect of Eliquis is bleeding. You should call your healthcare provider right away if you experience any of the following: ? Bleeding from an injury or your  nose that does not stop. ? Unusual colored urine (red or dark brown) or unusual colored stools (red or black). ? Unusual bruising for unknown reasons. ? A serious fall or if you hit your head (even if there is no bleeding).  Some medicines may interact with Eliquis and might increase your risk of bleeding or clotting while on Eliquis. To help avoid this, consult your healthcare provider or pharmacist prior to using any new prescription or non-prescription medications, including herbals, vitamins, non-steroidal anti-inflammatory drugs (NSAIDs) and supplements.  This website has more information on Eliquis (apixaban): [email or web address].com/eliquis/home

## 2020-06-05 NOTE — Progress Notes (Signed)
Inpatient Rehab Admissions Coordinator:   Heart rate stable.  Met with patient at bedside and will plan to bring to CIR today. Dr. Avon Gully in agreement and CSW aware.  I will call her daughter and let her know as well.   Shann Medal, PT, DPT Admissions Coordinator [phone] 06/05/20  11:26 AM

## 2020-06-06 ENCOUNTER — Other Ambulatory Visit (HOSPITAL_COMMUNITY): Payer: Self-pay

## 2020-06-06 DIAGNOSIS — E039 Hypothyroidism, unspecified: Secondary | ICD-10-CM

## 2020-06-06 DIAGNOSIS — R4701 Aphasia: Secondary | ICD-10-CM

## 2020-06-06 DIAGNOSIS — I63512 Cerebral infarction due to unspecified occlusion or stenosis of left middle cerebral artery: Secondary | ICD-10-CM

## 2020-06-06 DIAGNOSIS — E785 Hyperlipidemia, unspecified: Secondary | ICD-10-CM

## 2020-06-06 DIAGNOSIS — I4891 Unspecified atrial fibrillation: Secondary | ICD-10-CM

## 2020-06-06 LAB — CBC WITH DIFFERENTIAL/PLATELET
Abs Immature Granulocytes: 0.02 10*3/uL (ref 0.00–0.07)
Basophils Absolute: 0.1 10*3/uL (ref 0.0–0.1)
Basophils Relative: 1 %
Eosinophils Absolute: 0.4 10*3/uL (ref 0.0–0.5)
Eosinophils Relative: 6 %
HCT: 40.5 % (ref 36.0–46.0)
Hemoglobin: 12.6 g/dL (ref 12.0–15.0)
Immature Granulocytes: 0 %
Lymphocytes Relative: 15 %
Lymphs Abs: 1 10*3/uL (ref 0.7–4.0)
MCH: 27.5 pg (ref 26.0–34.0)
MCHC: 31.1 g/dL (ref 30.0–36.0)
MCV: 88.2 fL (ref 80.0–100.0)
Monocytes Absolute: 0.7 10*3/uL (ref 0.1–1.0)
Monocytes Relative: 9 %
Neutro Abs: 4.9 10*3/uL (ref 1.7–7.7)
Neutrophils Relative %: 69 %
Platelets: 175 10*3/uL (ref 150–400)
RBC: 4.59 MIL/uL (ref 3.87–5.11)
RDW: 15.9 % — ABNORMAL HIGH (ref 11.5–15.5)
WBC: 7 10*3/uL (ref 4.0–10.5)
nRBC: 0 % (ref 0.0–0.2)

## 2020-06-06 LAB — COMPREHENSIVE METABOLIC PANEL
ALT: 13 U/L (ref 0–44)
AST: 19 U/L (ref 15–41)
Albumin: 3.5 g/dL (ref 3.5–5.0)
Alkaline Phosphatase: 56 U/L (ref 38–126)
Anion gap: 8 (ref 5–15)
BUN: 14 mg/dL (ref 8–23)
CO2: 26 mmol/L (ref 22–32)
Calcium: 9.2 mg/dL (ref 8.9–10.3)
Chloride: 104 mmol/L (ref 98–111)
Creatinine, Ser: 0.88 mg/dL (ref 0.44–1.00)
GFR, Estimated: >60 mL/min (ref >60)
Glucose, Bld: 121 mg/dL — ABNORMAL HIGH (ref 70–99)
Potassium: 4.5 mmol/L (ref 3.5–5.1)
Sodium: 138 mmol/L (ref 135–145)
Total Bilirubin: 1.3 mg/dL — ABNORMAL HIGH (ref 0.3–1.2)
Total Protein: 6.3 g/dL — ABNORMAL LOW (ref 6.5–8.1)

## 2020-06-06 NOTE — Progress Notes (Signed)
Patient information reviewed and entered into eRehab System by Becky Foch Rosenwald, PPS coordinator. Information including medical coding, function ability, and quality indicators will be reviewed and updated through discharge.   

## 2020-06-06 NOTE — Evaluation (Signed)
Speech Language Pathology Assessment and Plan  Patient Details  Name: Katrina Randolph MRN: [id number] Date of Birth: 10-Oct-1930  SLP Diagnosis: Aphasia;Speech and Language deficits  Rehab Potential: Good ELOS: 5- days    Today's Date: 06/06/2020 SLP Individual Time: 1430-1530 SLP Individual Time Calculation (min): 60 min   Hospital Problem: Principal Problem:   Left middle cerebral artery stroke [facility]) Active Problems:   Expressive aphasia   Atrial fibrillation ([place])  Past Medical History:  Past Medical History:  Diagnosis Date  . Atrial fibrillation [facility])    Past Surgical History:  Past Surgical History:  Procedure Laterality Date  . ABDOMINAL HYSTERECTOMY    . BIOPSY  10/19/2019   Procedure: BIOPSY;  Surgeon: [name], MD;  Location: [facility];  Service: Gastroenterology;;  . BREAST LUMPECTOMY    . CHOLECYSTECTOMY    . COLONOSCOPY WITH PROPOFOL N/A 10/19/2019   Procedure: COLONOSCOPY WITH PROPOFOL;  Surgeon: [name], MD;  [facility];  Service: Gastroenterology;  Laterality: N/A;  . ESOPHAGOGASTRODUODENOSCOPY (EGD) WITH PROPOFOL N/A 10/19/2019   Procedure: ESOPHAGOGASTRODUODENOSCOPY (EGD) WITH PROPOFOL;  Surgeon: [name], MD;  [facility];  Service: Gastroenterology;  Laterality: N/A;  . POLYPECTOMY  10/19/2019   Procedure: POLYPECTOMY;  Surgeon: [name], MD;  [facility];  Service: Gastroenterology;;    Assessment / Plan / Recommendation Clinical Impression Katrina Randolph is an 87 year old right-handed female with history of atrial fibrillation not on anticoagulation as patient did see [name] in the past for possible watchman's consideration of which patient declined, hypothyroidism, tobacco use.  Per chart review lives alone 1 level home one-step to entry.  Independent with assistive device.  She is independent with homemaking ADLs and still drives.  Presented 05/30/2020 with aphasia and  decrease in balance as well as decreased sensation on her right side of acute onset.  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  Moderate chronic microvascular ischemic disease.  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  Patient did not receive tPA.  Admission chemistries unremarkable except potassium 3.4 glucose 118.  Echocardiogram with ejection fraction of 55 to 60% no wall motion abnormalities.  Carotid Dopplers with no ICA stenosis.  Initially maintained on aspirin for CVA prophylaxis and changed to Eliquis in light of history of atrial fibrillation.  Patient was to be admitted for 23 2022 but held due to developing RVR and was held to be monitored on telemetry no changes were made except the addition of Toprol and patient remained on Eliquis.   Tolerating a regular consistency diet.  Due to patient's decreased functional ability aphasia was admitted for a comprehensive rehab program. No complaints this morning.   Pt presents with higher level moderate receptive and expressive language impairments. 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 Pt demonstrate appropriate confirmation naming but difficulty with more complex tasks including divergent naming (named 7 animals in 1 minute, n=>15.) Pt demonstrated deficits in following complex directions/commands in executive function tasks, requiring repetition cues. Pt scored 18 indicating severe deficits in non-linguistic cognition (receptive language) and 36 indicating moderate deficits in linguistic/aphasia on CLQT. Pt would benefit in targeting receptive language in functional task including reading comprehension, money/medication/time management and targeting expressive language in conversation/higher level structured tasks. Pt supported no  changes in speech nor swallow  function. Pt would benefit from skilled ST services in order to maximize functional independence and reduce burden of care, likely requiring intermittent supervision for complex tasks at discharge with continued skilled ST services.   Skilled Therapeutic Interventions          Skilled ST services focused on language skills. SLP facilitated administration of cognitive linguistic formal assessment and provided education of results. SLP and pt collaborated to set goals for cognitive linguistic needs during length of stay. All questions answered to satisfaction.  Pt was left in room with call bell within reach and bed alarm set. SLP recommends to continue skilled services.  SLP Assessment  Patient will need skilled [place] Pathology Services during CIR admission    Recommendations  Patient destination: Home Follow up Recommendations: Home Health SLP;Outpatient SLP Equipment Recommended: None recommended by SLP    SLP Frequency 3 to 5 out of 7 days   SLP Duration  SLP Intensity  SLP Treatment/Interventions 5- days  Minumum of 1-2 x/day, 30 to 90 minutes  Cognitive remediation/compensation;Cueing hierarchy;Functional tasks;Medication managment;Internal/external aids;Speech/Language facilitation;Patient/family education    Pain Pain Assessment Pain Score: 0-No pain  Prior Functioning Cognitive/Linguistic Baseline: Within functional limits Type of Home: House  Lives With: Alone Available Help at Discharge: Family;Available PRN/intermittently Vocation: Retired  SLP Evaluation Cognition Overall Cognitive Status: Impaired/Different from baseline Arousal/Alertness: Awake/alert Orientation Level: Oriented X4 Attention: Sustained;Selective Sustained Attention: Appears intact Selective Attention: Appears intact Memory: Impaired (impacted by language) Memory Impairment: Decreased recall of new information Awareness: Appears intact Problem Solving:  Impaired (impacted by language) Problem Solving Impairment: Verbal complex;Functional complex Safety/Judgment: Appears intact  Comprehension Auditory Comprehension Overall Auditory Comprehension: Impaired Yes/No Questions: Within Functional Limits Commands: Impaired One Step Basic Commands: 75-100% accurate Two Step Basic Commands: 75-100% accurate Multistep Basic Commands: 50-74% accurate Complex Commands: 50-74% accurate Conversation: Simple EffectiveTechniques: Extra processing time;Repetition Visual Recognition/Discrimination Discrimination: Within Function Limits Reading Comprehension Reading Status: Not tested Expression Expression Primary Mode of Expression: Verbal Verbal Expression Overall Verbal Expression: Impaired Initiation: Impaired Level of Generative/Spontaneous Verbalization: Sentence;Conversation Naming: Impairment Confrontation: Within functional limits Divergent: 50-74% accurate Verbal Errors: Phonemic paraphasias;Aware of errors Pragmatics: No impairment Effective Techniques: Sentence completion;Phonemic cues Oral Motor Oral Motor/Sensory Function Overall Oral Motor/Sensory Function: Within functional limits Motor Speech Overall Motor Speech: Appears within functional limits for tasks assessed Respiration: Within functional limits Phonation: Normal Resonance: Within functional limits Articulation: Within functional limitis Intelligibility: Intelligible Motor Planning: Witnin functional limits Motor Speech Errors: Not applicable  Care Tool Care Tool Cognition Expression of Ideas and Wants Expression of Ideas and Wants: Some difficulty - exhibits some difficulty with expressing needs and ideas (e.g, some words or finishing thoughts) or speech is not clear   Understanding Verbal and Non-Verbal Content Understanding Verbal and Non-Verbal Content: Usually understands - understands most conversations, but misses some part/intent of message. Requires cues  at times to understand   Memory/Recall Ability *first 3 days only Memory/Recall Ability *first 3 days only: Current season;Location of own room;Staff names and faces;That he or she is in a hospital/hospital unit    Short Term Goals: Week 1: SLP Short Term Goal 1 (Week 1): STG=LTG due to short ELOS  Refer to Care Plan for Long Term Goals  Recommendations for other services: None   Discharge Criteria: Patient will be discharged from SLP if patient refuses treatment 3 consecutive times without medical reason, if treatment goals not met, if there is a change in medical status, if patient makes no progress towards goals or if  patient is discharged from hospital.  The above assessment, treatment plan, treatment alternatives and goals were discussed and mutually agreed upon: by patient  Reather Steller  [facility] 06/06/2020, 4:10 PM

## 2020-06-06 NOTE — Evaluation (Signed)
Occupational Therapy Assessment and Plan  Patient Details  Name: Katrina Randolph MRN: [id number] Date of Birth: 1930/04/12  OT Diagnosis: apraxia, cognitive deficits and decreased sensation/proprioception of R side Rehab Potential: Rehab Potential (ACUTE ONLY): Excellent ELOS: 5-7 days   Today's Date: 06/06/2020 OT Individual Time: 1100-1200 OT Individual Time Calculation (min): 60 min     Hospital Problem: Principal Problem:   Left middle cerebral artery stroke [facility])   Past Medical History:  Past Medical History:  Diagnosis Date  . Atrial fibrillation [facility])    Past Surgical History:  Past Surgical History:  Procedure Laterality Date  . ABDOMINAL HYSTERECTOMY    . BIOPSY  10/19/2019   Procedure: BIOPSY;  Surgeon: [name], MD;  [facility];  Service: Gastroenterology;;  . BREAST LUMPECTOMY    . CHOLECYSTECTOMY    . COLONOSCOPY WITH PROPOFOL N/A 10/19/2019   Procedure: COLONOSCOPY WITH PROPOFOL;  Surgeon: [name], MD;  [facility];  Service: Gastroenterology;  Laterality: N/A;  . ESOPHAGOGASTRODUODENOSCOPY (EGD) WITH PROPOFOL N/A 10/19/2019   Procedure: ESOPHAGOGASTRODUODENOSCOPY (EGD) WITH PROPOFOL;  Surgeon: [name], MD;  [facility];  Service: Gastroenterology;  Laterality: N/A;  . POLYPECTOMY  10/19/2019   Procedure: POLYPECTOMY;  Surgeon: [name], MD;  [facility];  Service: Gastroenterology;;    Assessment & Plan Clinical Impression:  Katrina Randolph is an 87 year old right-handed female with history of atrial fibrillation not on anticoagulation as patient did see [name] in the past for possible watchman's consideration of which patient declined, hypothyroidism, tobacco use. Per chart review lives alone 1 level home one-step to entry. Independent with assistive device. She is independent with homemaking ADLs and still drives. Presented 05/30/2020 with aphasia and decrease in balance  as well as decreased sensation on her right side of acute onset. 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 Moderate chronic microvascular ischemic disease. 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 Patient did not receive tPA. Admission chemistries unremarkable except potassium 3.4 glucose 118. Echocardiogram with ejection fraction of 55 to 60% no wall motion abnormalities. Carotid Dopplers with no ICA stenosis. Initially maintained on aspirin for CVA prophylaxis and changed to Eliquis in light of history of atrial fibrillation. Patient was to be admitted for 23 2022 but held due to developing RVR and was held to be monitored on telemetry no changes were made except the addition of Toprol and patient remained on Eliquis. Tolerating a regular consistency diet. Due to patient's decreased functional ability aphasia was admitted for a comprehensive rehab program. No complaints this morning.    Patient transferred to CIR on 06/05/2020 .    Patient currently requires supervision with self care and min  with mobility secondary to decreased cardiorespiratoy endurance, motor apraxia and decreased coordination, decreased problem solving and decreased standing balance and hemiplegia.  Prior to hospitalization, patient lived alone and was fully independent.   Patient will benefit from skilled intervention to increase independence with basic self-care skills and increase level of independence with iADL prior to discharge home independently.   Anticipate patient will require intermittent supervision and follow up home health.  OT - End of Session Endurance Deficit: Yes Endurance Deficit Description: Afib (pre morbid) with HR elevating to 140's during gait. Benefits from rest breaks for recovery and settling on HR OT Assessment Rehab Potential (ACUTE ONLY): Excellent OT Patient  demonstrates impairments in the following area(s): Balance;Cognition;Endurance;Motor;Sensory OT Basic ADL's Functional Problem(s): Bathing;Dressing;Toileting OT Advanced ADL's Functional Problem(s): Simple Meal Preparation;Light Housekeeping;Laundry OT Transfers Functional Problem(s): Toilet;Tub/Shower OT Additional Impairment(s): Fuctional Use of Upper Extremity OT Plan OT Intensity: Minimum of 1-2 x/day, 45 to 90 minutes OT Frequency: 5 out of 7 days OT Duration/Estimated Length of Stay: 5-7 days OT Treatment/Interventions: Balance/vestibular training;Cognitive remediation/compensation;Discharge planning;Functional mobility training;Neuromuscular re-education;Self Care/advanced ADL retraining;Patient/family education;Therapeutic Activities;Therapeutic Exercise;UE/LE Strength taining/ROM;UE/LE Coordination activities;Psychosocial support OT Self Feeding Anticipated Outcome(s): independent OT Basic Self-Care Anticipated Outcome(s): Mod I OT Toileting Anticipated Outcome(s): Mod I OT Bathroom Transfers Anticipated Outcome(s): Mod I OT Recommendation Patient destination: Home Follow Up Recommendations: Home health OT Equipment Recommended: None recommended by OT   OT Evaluation Precautions/Restrictions  Precautions Precautions: Fall Precaution Comments: Expressive > receptive aphasia Restrictions Weight Bearing Restrictions: No  Pain Pain Assessment Pain Scale: 0-10 Pain Score: 0-No pain Home Living/Prior Functioning Home Living Living Arrangements: Alone Available Help at Discharge: Family,Available PRN/intermittently Type of Home: House Home Access: Stairs to enter [employer] of Steps: 1 Entrance Stairs-Rails: None Home Layout: One level Bathroom Shower/Tub: Multimedia programmer: Handicapped height Bathroom Accessibility: Yes  Lives With: Alone Prior Function Level of Independence: Independent with gait,Independent with transfers,Independent with  homemaking with ambulation  Able to Take Stairs?: Yes Driving: Yes Vocation: Retired Comments: Fully indep with no DME needs. +driving and grocery shopping, manaing her own medications and finances. Vision Baseline Vision/History: No visual deficits;Wears glasses Wears Glasses: Reading only Patient Visual Report: No change from baseline Vision Assessment?: Vision impaired- to be further tested in functional context Perception  Perception: Within Functional Limits Praxis Praxis: Intact Cognition Overall Cognitive Status: Impaired/Different from baseline Arousal/Alertness: Awake/alert Orientation Level: Person;Place;Situation Person: Oriented Place: Oriented Situation: Oriented Year: 2022 Month: April Day of Week: Correct Memory: Appears intact Immediate Memory Recall: Sock;Blue;Bed Memory Recall Sock: Without Cue Memory Recall Blue: Without Cue Memory Recall Bed: Without Cue Awareness: Appears intact Problem Solving: Impaired Problem Solving Impairment: Verbal complex;Functional complex Safety/Judgment: Appears intact Sensation Sensation Light Touch: Impaired by gross assessment Hot/Cold: Appears Intact Proprioception: Impaired by gross assessment (R great toe 50% accuracy) Stereognosis: Impaired by gross assessment Coordination Gross Motor Movements are Fluid and Coordinated: No Fine Motor Movements are Fluid and Coordinated: No Coordination and Movement Description: Delayed and mild blocked movement patterns. Finger Nose Finger Test: WFL - no dysmetria noted Heel Shin Test: WFL - limited by R hip flexion weakness Motor  Motor Motor: Hemiplegia Motor - Skilled Clinical Observations: Mild R hemi  Trunk/Postural Assessment  Cervical Assessment Cervical Assessment: Within Functional Limits Thoracic Assessment Thoracic Assessment: Exceptions to [facility] (rounded shoulders) Lumbar Assessment Lumbar Assessment: Exceptions to [facility] (posterior pelvic tilt) Postural  Control Postural Control: Within Functional Limits  Balance Balance Balance Assessed: Yes Static Sitting Balance Static Sitting - Balance Support: Feet supported;No upper extremity supported Static Sitting - Level of Assistance: 5: Stand by assistance Dynamic Sitting Balance Dynamic Sitting - Balance Support: Feet supported;No upper extremity supported;During functional activity Dynamic Sitting - Level of Assistance: 5: Stand by assistance (CGA) Static Standing Balance Static Standing - Balance Support: No upper extremity supported Static Standing - Level of Assistance: Other (comment) (CGA) Dynamic Standing Balance Dynamic Standing - Balance Support: During functional activity;No upper extremity supported Dynamic Standing - Level of Assistance: 4: Min assist Extremity/Trunk Assessment RUE Assessment RUE Assessment: Within Functional Limits LUE Assessment LUE Assessment: Within Functional Limits  Care Tool Care Tool Self Care Eating   Eating Assist Level:  Set up assist    Oral Care    Oral Care Assist Level: Supervision/Verbal cueing    Bathing   Body parts bathed by patient: Right arm;Left arm;Abdomen;Front perineal area;Chest;Buttocks;Right upper leg;Left upper leg;Right lower leg;Left lower leg;Face     Assist Level: Supervision/Verbal cueing    Upper Body Dressing(including orthotics)   What is the patient wearing?: Pull over shirt   Assist Level: Supervision/Verbal cueing    Lower Body Dressing (excluding footwear)   What is the patient wearing?: Pants;Underwear/pull up Assist for lower body dressing: Supervision/Verbal cueing    Putting on/Taking off footwear   What is the patient wearing?: Shoes Assist for footwear: Supervision/Verbal cueing       Care Tool Toileting Toileting activity   Assist for toileting: Supervision/Verbal cueing     Care Tool Bed Mobility Roll left and right activity    ind     Sit to lying activity    s    Lying to sitting  edge of bed activity    s     Care Tool Transfers Sit to stand transfer   Sit to stand assist level: Supervision/Verbal cueing    Chair/bed transfer   Chair/bed transfer assist level: Contact Guard/Touching assist     Toilet transfer   Assist Level: Contact Guard/Touching assist     Care Tool Cognition Expression of Ideas and Wants Expression of Ideas and Wants: Some difficulty - exhibits some difficulty with expressing needs and ideas (e.g, some words or finishing thoughts) or speech is not clear   Understanding Verbal and Non-Verbal Content Understanding Verbal and Non-Verbal Content: Understands (complex and basic) - clear comprehension without cues or repetitions   Memory/Recall Ability *first 3 days only Memory/Recall Ability *first 3 days only: Current season;Location of own room;Staff names and faces;That Randolph or she is in a hospital/hospital unit    Refer to Care Plan for [place] 1 OT Short Term Goal 1 (Week 1): STGs = LTGs  Recommendations for other services: None    Skilled Therapeutic Intervention ADL ADL Grooming: Supervision/safety Where Assessed-Grooming: Standing at sink Upper Body Bathing: Supervision/safety Where Assessed-Upper Body Bathing: Shower Lower Body Bathing: Supervision/safety Where Assessed-Lower Body Bathing: Shower Upper Body Dressing: Supervision/safety Where Assessed-Upper Body Dressing: Chair Lower Body Dressing: Supervision/safety Where Assessed-Lower Body Dressing: Chair Toileting: Supervision/safety Where Assessed-Toileting: Glass blower/designer: Therapist, music Method: Counselling psychologist: Energy manager: Curator Method: Heritage manager: Grab bars Mobility  Bed Mobility Bed Mobility: Supine to Sit Supine to Sit: Supervision/Verbal cueing Transfers Sit to Stand: Contact Guard/Touching assist Stand  to Sit: Contact Guard/Touching assist   Pt seen for initial evaluation and ADL training with a focus on balance, R side sensory awareness.  Pt did an excellent job today demonstrating good safety awareness using wall support /grab bars as needed as she completed all self care tasks.  Showered in standing like she does at home, but recommended next time she use the bench for energy conservation as she has afib.  Pt did well ambulating around room with CGA but recommended she use the RW with nursing staff.  Reviewed role of OT, OT POC, discussed goals. Pt resting in recliner with all needs met.    Discharge Criteria: Patient will be discharged from OT if patient refuses treatment 3 consecutive times without medical reason, if treatment goals not met, if there is a change in medical status, if  patient makes no progress towards goals or if patient is discharged from hospital.  The above assessment, treatment plan, treatment alternatives and goals were discussed and mutually agreed upon: by patient  [facility] 06/06/2020, 11:46 AM

## 2020-06-06 NOTE — Progress Notes (Signed)
[facility] Individual Statement of Services  Patient Name:  Katrina Randolph  Date:  06/06/2020  Welcome to the [facility].  Our goal is to provide you with an individualized program based on your diagnosis and situation, designed to meet your specific needs.  With this comprehensive rehabilitation program, you will be expected to participate in at least 3 hours of rehabilitation therapies Monday-Friday, with modified therapy programming on the weekends.  Your rehabilitation program will include the following services:  Physical Therapy (PT), Occupational Therapy (OT), Speech Therapy (ST), 24 hour per day rehabilitation nursing, Care Coordinator, Rehabilitation Medicine, Nutrition Services and Pharmacy Services  Weekly team conferences will be held on Wednesday to discuss your progress.  Your Inpatient Rehabilitation Care Coordinator will talk with you frequently to get your input and to update you on team discussions.  Team conferences with you and your family in attendance may also be held.  Expected length of stay: 5-10 days  Overall anticipated outcome: independent with device  Depending on your progress and recovery, your program may change. Your Inpatient Rehabilitation Care Coordinator will coordinate services and will keep you informed of any changes. Your Inpatient Rehabilitation Care Coordinator's name and contact numbers are listed  below.  The following services may also be recommended but are not provided by the [facility] will be made to provide these services after discharge if needed.  Arrangements include referral to agencies that provide these services.  Your insurance has been verified to be:  [employer]-Medicare Your primary doctor is:  [name]  Pertinent information will be shared with  your doctor and your insurance company.  Inpatient Rehabilitation Care Coordinator:  Ovidio Kin, Pri[place] or Emilia Beck  Information discussed with and copy given to patient by: Elease Hashimoto, 06/06/2020, 11:27 AM

## 2020-06-06 NOTE — Evaluation (Signed)
Physical Therapy Assessment and Plan  Patient Details  Name: Katrina Randolph MRN: [id number] Date of Birth: 20-May-1930  PT Diagnosis: Abnormal posture, Abnormality of gait, Difficulty walking, Hemiparesis dominant, Impaired sensation and Muscle weakness Rehab Potential: Good ELOS: 5-10 days   Today's Date: 06/06/2020 PT Individual Time: 0901-0959 PT Individual Time Calculation (min): 58 min    Hospital Problem: Principal Problem:   Left middle cerebral artery stroke [facility])   Past Medical History:  Past Medical History:  Diagnosis Date  . Atrial fibrillation [facility])    Past Surgical History:  Past Surgical History:  Procedure Laterality Date  . ABDOMINAL HYSTERECTOMY    . BIOPSY  10/19/2019   Procedure: BIOPSY;  Surgeon: [name], MD;  Location: [facility];  Service: Gastroenterology;;  . BREAST LUMPECTOMY    . CHOLECYSTECTOMY    . COLONOSCOPY WITH PROPOFOL N/A 10/19/2019   Procedure: COLONOSCOPY WITH PROPOFOL;  Surgeon: [name], MD;  Location: [facility];  Service: Gastroenterology;  Laterality: N/A;  . ESOPHAGOGASTRODUODENOSCOPY (EGD) WITH PROPOFOL N/A 10/19/2019   Procedure: ESOPHAGOGASTRODUODENOSCOPY (EGD) WITH PROPOFOL;  Surgeon: [name], MD;  Location: [facility];  Service: Gastroenterology;  Laterality: N/A;  . POLYPECTOMY  10/19/2019   Procedure: POLYPECTOMY;  Surgeon: [name], MD;  Location: [facility];  Service: Gastroenterology;;    Assessment & Plan Clinical Impression: Patient is an 87 year old right-handed female with history of atrial fibrillation not on anticoagulation as patient did see [name] in the past for possible watchman's consideration of which patient declined, hypothyroidism, tobacco use.  Per chart review lives alone 1 level home one-step to entry.  Independent with assistive device.  She is independent with homemaking ADLs and still drives.  Presented 05/30/2020 with aphasia and decrease in balance as well  as decreased sensation on her right side of acute onset.  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  Moderate chronic microvascular ischemic disease.  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  Patient did not receive tPA.  Admission chemistries unremarkable except potassium 3.4 glucose 118.  Echocardiogram with ejection fraction of 55 to 60% no wall motion abnormalities.  Carotid Dopplers with no ICA stenosis.  Initially maintained on aspirin for CVA prophylaxis and changed to Eliquis in light of history of atrial fibrillation.  Patient was to be admitted for 23 2022 but held due to developing RVR and was held to be monitored on telemetry no changes were made except the addition of Toprol and patient remained on Eliquis.   Tolerating a regular consistency diet.  Due to patient's decreased functional ability aphasia was admitted for a comprehensive rehab program.  Patient transferred to CIR on 06/05/2020 .   Patient currently requires min with mobility secondary to muscle weakness, decreased cardiorespiratoy endurance, unbalanced muscle activation, motor apraxia, decreased coordination and decreased motor planning and decreased standing balance, decreased postural control, hemiplegia and decreased balance strategies.  Prior to hospitalization, patient was independent  with mobility and lived with Alone in a House home.  Home access is 1Stairs to enter.  Patient will benefit from skilled PT intervention to maximize safe functional mobility, minimize fall risk and decrease caregiver burden for planned discharge home with intermittent assist.  Anticipate patient will benefit from follow up [facility] at discharge.  PT - End of Session Activity Tolerance: Tolerates 10 - 20 min activity with multiple rests Endurance Deficit:  Yes Endurance Deficit Description: Afib (pre morbid) with HR  elevating to 140's during gait. Benefits from rest breaks for recovery and settling on HR PT Assessment Rehab Potential (ACUTE/IP ONLY): Good PT Barriers to Discharge: Decreased caregiver support;Home environment access/layout;Insurance for SNF coverage;Lack of/limited family support PT Patient demonstrates impairments in the following area(s): Balance;Endurance;Motor;Safety;Sensory;Skin Integrity PT Transfers Functional Problem(s): Bed Mobility;Bed to Chair;Furniture PT Locomotion Functional Problem(s): Ambulation;Stairs PT Plan PT Intensity: Minimum of 1-2 x/day ,45 to 90 minutes PT Frequency: 5 out of 7 days PT Duration Estimated Length of Stay: 5-10 days PT Treatment/Interventions: Ambulation/gait training;Discharge planning;Functional mobility training;Psychosocial support;Therapeutic Activities;Visual/perceptual remediation/compensation;Therapeutic Exercise;Skin care/wound management;Neuromuscular re-education;Disease management/prevention;Balance/vestibular training;Wheelchair propulsion/positioning;UE/LE Strength taining/ROM;Cognitive remediation/compensation;DME/adaptive equipment instruction;Pain management;Splinting/orthotics;UE/LE Coordination activities;Stair training;Patient/family education;Community reintegration PT Transfers Anticipated Outcome(s): mod I with LRAD PT Locomotion Anticipated Outcome(s): mod I with LRAD PT Recommendation Recommendations for Other Services: Neuropsych consult Follow Up Recommendations: Home health PT;24 hour supervision/assistance Patient destination: Home Equipment Recommended: To be determined Equipment Details: Pt reports she ownsDME from deceased husband   PT Evaluation Precautions/Restrictions Precautions Precautions: Fall Precaution Comments: Expressive > receptive aphasia Restrictions Weight Bearing Restrictions: No General Chart Reviewed: Yes Additional Pertinent History: PMH includes: A-fib, HTN, and anemia. Family/Caregiver  Present: No  Pain Pain Assessment Pain Scale: 0-10 Pain Score: 0-No pain Home Living/Prior Functioning Home Living Living Arrangements: Alone Available Help at Discharge: Family;Available PRN/intermittently Type of Home: House Home Access: Stairs to enter [employer] of Steps: 1 Entrance Stairs-Rails: None Home Layout: One level Bathroom Shower/Tub: Multimedia programmer: Handicapped height Bathroom Accessibility: Yes  Lives With: Alone Prior Function Level of Independence: Independent with gait;Independent with transfers;Independent with homemaking with ambulation  Able to Take Stairs?: Yes Driving: Yes Vocation: Retired Comments: Fully indep with no DME needs. +driving and grocery shopping, manaing her own medications and finances. Vision/Perception  Perception Perception: Within Functional Limits Praxis Praxis: Intact  Cognition Overall Cognitive Status: Impaired/Different from baseline Arousal/Alertness: Awake/alert Orientation Level: Oriented X4 Memory: Appears intact Immediate Memory Recall: Sock;Blue;Bed Memory Recall Sock: Without Cue Memory Recall Blue: Without Cue Memory Recall Bed: Without Cue Awareness: Appears intact Problem Solving: Impaired Problem Solving Impairment: Verbal complex;Functional complex Safety/Judgment: Appears intact Sensation Sensation Light Touch: Impaired by gross assessment Hot/Cold: Appears Intact Proprioception: Impaired by gross assessment (R great toe 50% accuracy) Stereognosis: Impaired by gross assessment Coordination Gross Motor Movements are Fluid and Coordinated: No Fine Motor Movements are Fluid and Coordinated: No Coordination and Movement Description: Delayed and mild blocked movement patterns. Finger Nose Finger Test: WFL - no dysmetria noted Heel Shin Test: WFL - limited by R hip flexion weakness Motor  Motor Motor: Hemiplegia Motor - Skilled Clinical Observations: Mild R hemi   Trunk/Postural Assessment  Cervical Assessment Cervical Assessment: Within Functional Limits Thoracic Assessment Thoracic Assessment: Exceptions to [facility] (rounded shoulders) Lumbar Assessment Lumbar Assessment: Exceptions to [facility] (posterior pelvic tilt) Postural Control Postural Control: Within Functional Limits  Balance Balance Balance Assessed: Yes Static Sitting Balance Static Sitting - Balance Support: Feet supported;No upper extremity supported Static Sitting - Level of Assistance: 5: Stand by assistance Dynamic Sitting Balance Dynamic Sitting - Balance Support: Feet supported;No upper extremity supported;During functional activity Dynamic Sitting - Level of Assistance: 5: Stand by assistance (CGA) Static Standing Balance Static Standing - Balance Support: No upper extremity supported Static Standing - Level of Assistance: Other (comment) (CGA) Dynamic Standing Balance Dynamic Standing - Balance Support: During functional activity;No upper extremity supported Dynamic Standing - Level of Assistance: 4: Min assist Extremity Assessment  RUE  Assessment RUE Assessment: Within Functional Limits LUE Assessment LUE Assessment: Within Functional Limits RLE Assessment RLE Assessment: Exceptions to [facility] General Strength Comments: Grossly 4/5 LLE Assessment LLE Assessment: Within Functional Limits General Strength Comments: Grossly 4+/5  Care Tool Care Tool Bed Mobility Roll left and right activity   Roll left and right assist level: Supervision/Verbal cueing    Sit to lying activity   Sit to lying assist level: Supervision/Verbal cueing    Lying to sitting edge of bed activity   Lying to sitting edge of bed assist level: Supervision/Verbal cueing     Care Tool Transfers Sit to stand transfer   Sit to stand assist level: Supervision/Verbal cueing    Chair/bed transfer   Chair/bed transfer assist level: Contact Guard/Touching assist     Toilet transfer   Assist Level:  Contact Guard/Touching assist    Car transfer   Car transfer assist level: Contact Guard/Touching assist      Care Tool Locomotion Ambulation   Assist level: Minimal Assistance - Patient > 75% Assistive device: No Device Max distance: 16f  Walk 10 feet activity   Assist level: Contact Guard/Touching assist Assistive device: No Device   Walk 50 feet with 2 turns activity   Assist level: Minimal Assistance - Patient > 75% Assistive device: No Device  Walk 150 feet activity Walk 150 feet activity did not occur: Safety/medical concerns (Elevated HR)      Walk 10 feet on uneven surfaces activity Walk 10 feet on uneven surfaces activity did not occur:  (Ramp) Assist level: Minimal Assistance - Patient > 75% Assistive device: Other (comment) (No devie)  Stairs Stair activity did not occur: Safety/medical concerns        Walk up/down 1 step activity Walk up/down 1 step or curb (drop down) activity did not occur: Safety/medical concerns     Walk up/down 4 steps activity did not occuR: Safety/medical concerns  Walk up/down 4 steps activity      Walk up/down 12 steps activity Walk up/down 12 steps activity did not occur: Safety/medical concerns      Pick up small objects from floor Pick up small object from the floor (from standing position) activity did not occur: Safety/medical concerns      Wheelchair Will patient use wheelchair at discharge?: No   Wheelchair activity did not occur: N/A      Wheel 50 feet with 2 turns activity Wheelchair 50 feet with 2 turns activity did not occur: N/A    Wheel 150 feet activity Wheelchair 150 feet activity did not occur: N/A      Refer to Care Plan for Long Term Goals  SHORT TERM GOAL WEEK 1 PT Short Term Goal 1 (Week 1): STG = LTG due to ELOS  Recommendations for other services: Neuropsych  Skilled Therapeutic Intervention Mobility Bed Mobility Bed Mobility: Supine to Sit Supine to Sit: Supervision/Verbal  cueing Transfers Transfers: Sit to Stand;Stand to Sit;Stand Pivot Transfers Sit to Stand: Contact Guard/Touching assist Stand to Sit: Contact Guard/Touching assist Stand Pivot Transfers: Contact Guard/Touching assist Transfer (Assistive device): None Locomotion  Gait Ambulation: Yes Gait Assistance: Minimal Assistance - Patient > 75% Gait Distance (Feet): 120 Feet Assistive device: None Gait Assistance Details: Manual facilitation for weight shifting;Verbal cues for technique;Verbal cues for sequencing;Verbal cues for gait pattern;Verbal cues for precautions/safety;Tactile cues for sequencing;Visual cues/gestures for sequencing Gait Gait: Yes Gait Pattern: Impaired Gait Pattern: Step-through pattern;Decreased step length - right;Decreased stance time - right;Wide base of support;Decreased weight shift to right Gait  velocity: decr Stairs / Additional Locomotion Stairs: No Ramp: Minimal Assistance - Patient >75% Wheelchair Mobility Wheelchair Mobility: No  Skilled Intervention: Pt greeted supine in bed. Awake and agreeable to therapy. No reports of pain but reports n/t in R hemibody, described as bursts of "electric shock." Initiated functional mobility as outlined above. Supervision for bed mobility, CGA for sit<>stand transfers and stand<>pivot transfers. Ambulation up to 168f with minA and no AD and needing minA for car transfer. Needing minA for ambulating up/down 128framp. Gait deficits include increased lateral unsteadiness, wide BOS, decreased gait speed. HR elevated to ~140's after short distance gait, requiring seated rest break for recovery (pt with pre morbid Afib). 5xSTS in 24 seconds, unsupported. 5xSTS score > 15 seconds indicates increased falls risk. Pt ended session seated in recliner with BLE elevated, chair alarm on, needs within reach.  Instructed pt in results of PT evaluation as detailed above, PT POC, rehab potential, rehab goals, and discharge recommendations.  Additionally discussed CIR's policies regarding fall safety and use of chair alarm and/or quick release belt. Pt verbalized understanding and in agreement. Will update pt's family members as they become available.   Discharge Criteria: Patient will be discharged from PT if patient refuses treatment 3 consecutive times without medical reason, if treatment goals not met, if there is a change in medical status, if patient makes no progress towards goals or if patient is discharged from hospital.  The above assessment, treatment plan, treatment alternatives and goals were discussed and mutually agreed upon: by patient  ChAlger Simons/26/2022, 12:21 PM

## 2020-06-06 NOTE — Progress Notes (Signed)
[place] PHYSICAL MEDICINE & REHABILITATION PROGRESS NOTE  Subjective/Complaints: Patient seen laying in bed this morning.  She states she slept well overnight.  She states she is ready begin therapies.  ROS: + Sensory changes right side. Denies CP, SOB, N/V/D  Objective: Vital Signs: Blood pressure (!) 127/94, pulse 89, temperature 98.6 F (37 C), temperature source Oral, resp. rate 20, height 5\' 2"  (1.575 m), weight 74.7 kg, SpO2 95 %. No results found. Recent Labs    06/05/20 0254 06/06/20 0511  WBC 7.7 7.0  HGB 11.9* 12.6  HCT 38.9 40.5  PLT 187 175   Recent Labs    06/06/20 0511  NA 138  K 4.5  CL 104  CO2 26  GLUCOSE 121*  BUN 14  CREATININE 0.88  CALCIUM 9.2    Intake/Output Summary (Last 24 hours) at 06/06/2020 1242 Last data filed at 06/06/2020 0830 Gross per 24 hour  Intake 120 ml  Output --  Net 120 ml        Physical Exam: BP (!) 127/94 (BP Location: Left Arm)   Pulse 89   Temp 98.6 F (37 C) (Oral)   Resp 20   Ht 5\' 2"  (1.575 m)   Wt 74.7 kg   SpO2 95%   BMI 30.12 kg/m  Constitutional: No distress . Vital signs reviewed. HENT: Normocephalic.  Atraumatic. Eyes: EOMI. No discharge. Cardiovascular: No JVD.  Irregularly irregular.Marland Kitchen Respiratory: Normal effort.  No stridor.  Bilateral clear to auscultation. GI: Non-distended.  BS +. Skin: Warm and dry.  Intact.  Scattered ecchymosis. Psych: Normal mood.  Normal behavior. Musc: No edema in extremities.  No tenderness in extremities. Neuro: Alert and oriented Mild expressive aphasia Motor: RUE/RLE: 4+-5/5 proximal distal LUE/LE: 5/5 proximal distal Sensation diminished to light touch right side  Assessment/Plan: 1. Functional deficits which require 3+ hours per day of interdisciplinary therapy in a comprehensive inpatient rehab setting.  Physiatrist is providing close team supervision and 24 hour management of active medical problems listed below.  Physiatrist and rehab team continue to  assess barriers to discharge/monitor patient progress toward functional and medical goals   Care Tool:  Bathing    Body parts bathed by patient: Right arm,Left arm,Abdomen,Front perineal area,Chest,Buttocks,Right upper leg,Left upper leg,Right lower leg,Left lower leg,Face         Bathing assist Assist Level: Supervision/Verbal cueing     Upper Body Dressing/Undressing Upper body dressing   What is the patient wearing?: Pull over shirt    Upper body assist Assist Level: Supervision/Verbal cueing    Lower Body Dressing/Undressing Lower body dressing      What is the patient wearing?: Pants,Underwear/pull up     Lower body assist Assist for lower body dressing: Supervision/Verbal cueing     Toileting Toileting    Toileting assist Assist for toileting: Supervision/Verbal cueing     Transfers Chair/bed transfer  Transfers assist     Chair/bed transfer assist level: Contact Guard/Touching assist     Locomotion Ambulation   Ambulation assist      Assist level: Minimal Assistance - Patient > 75% Assistive device: No Device Max distance: 11ft   Walk 10 feet activity   Assist     Assist level: Contact Guard/Touching assist Assistive device: No Device   Walk 50 feet activity   Assist    Assist level: Minimal Assistance - Patient > 75% Assistive device: No Device    Walk 150 feet activity   Assist Walk 150 feet activity did not occur: Safety/medical  concerns (Elevated HR)         Walk 10 feet on uneven surface  activity   Assist Walk 10 feet on uneven surfaces activity did not occur:  (Ramp)   Assist level: Minimal Assistance - Patient > 75% Assistive device: Other (comment) (No devie)   Wheelchair     Assist Will patient use wheelchair at discharge?: No   Wheelchair activity did not occur: N/A         Wheelchair 50 feet with 2 turns activity    Assist    Wheelchair 50 feet with 2 turns activity did not occur:  N/A       Wheelchair 150 feet activity     Assist  Wheelchair 150 feet activity did not occur: N/A        Medical Problem List and Plan: 1.  Aphasia as well as gait abnormality secondary to left MCA territory infarction  Begin CIR evaluations 2.  Antithrombotics: -DVT/anticoagulation: Eliquis             -antiplatelet therapy: N/A 3. Pain Management: Tylenol as needed 4. Mood: Zoloft 50 mg daily             -antipsychotic agents: N/A 5. Neuropsych: This patient is capable of making decisions on her own behalf. 6. Skin/Wound Care: Routine skin checks 7. Fluids/Electrolytes/Nutrition: Routine in and outs 8.  Atrial fibrillation.  Cardiac rate controlled.  Continue Toprol 25 mg daily.  Continue Eliquis             Monitor with increased activity 9.  Hypothyroidism: Synthroid 10. Hyperlipidemia: Lipitor 11. Obesity: BMI 30.97: provide counseling  12. Anemia: Resolved  13. AKI: Resolved   Continue to encourage fluids  LOS: 1 days A FACE TO FACE EVALUATION WAS PERFORMED  Katrina Randolph Lorie Phenix 06/06/2020, 12:42 PM

## 2020-06-06 NOTE — Progress Notes (Signed)
Inpatient Rehabilitation Care Coordinator Assessment and Plan Patient Details  Name: Katrina Randolph MRN: [id number] Date of Birth: 19-Apr-1930  Today's Date: 06/06/2020  Hospital Problems: Principal Problem:   Left middle cerebral artery stroke [facility])  Past Medical History:  Past Medical History:  Diagnosis Date  . Atrial fibrillation [facility])    Past Surgical History:  Past Surgical History:  Procedure Laterality Date  . ABDOMINAL HYSTERECTOMY    . BIOPSY  10/19/2019   Procedure: BIOPSY;  Surgeon: [name], MD;  Location: [facility];  Service: Gastroenterology;;  . BREAST LUMPECTOMY    . CHOLECYSTECTOMY    . COLONOSCOPY WITH PROPOFOL N/A 10/19/2019   Procedure: COLONOSCOPY WITH PROPOFOL;  Surgeon: [name], MD;  Location: [facility];  Service: Gastroenterology;  Laterality: N/A;  . ESOPHAGOGASTRODUODENOSCOPY (EGD) WITH PROPOFOL N/A 10/19/2019   Procedure: ESOPHAGOGASTRODUODENOSCOPY (EGD) WITH PROPOFOL;  Surgeon: [name], MD;  Location: [facility];  Service: Gastroenterology;  Laterality: N/A;  . POLYPECTOMY  10/19/2019   Procedure: POLYPECTOMY;  Surgeon: [name], MD;  Location: [facility];  Service: Gastroenterology;;   Social History:  reports that she has quit smoking. Her smoking use included cigarettes. She has never used smokeless tobacco. She reports current alcohol use. She reports that she does not use drugs.  Family / Support Systems Marital Status: Widow/Widower Patient Roles: Parent Children: Katrina Randolph-daughter [phone] Other Supports: Mitzi Hansen grandson Anticipated Caregiver: Daughter Ability/Limitations of Caregiver: Santiago Glad lives close by and other daughter lives on [place] and has gone back. Caregiver Availability: Other (Comment) (Awaiting goals and plan then family to decide what to do) Family Dynamics: Close with two daughter's one has gone back to [place] and may come back but depends upon pt's care needs. Local daughter  is 2-3 miles away and can go back and forth. Pt has church members and friends who check on her  Social History Preferred language: English Religion:  Cultural Background: No issues Education: HS Read: Yes Write: Yes Employment Status: Retired Public relations account executive Issues: No issues Guardian/Conservator: None-according to MD pt is capable of making her own decisions while here. Will include daughter due to speech issues from her CVA-aphasia   Abuse/Neglect Abuse/Neglect Assessment Can Be Completed: Yes Physical Abuse: Denies Verbal Abuse: Denies Sexual Abuse: Denies Exploitation of patient/patient's resources: Denies Self-Neglect: Denies  Emotional Status Pt's affect, behavior and adjustment status: Pt is motivated to do well and recover from this stroke. Her speech is coming back and she is glad of this and her movement is getting better daily. She is one who has always been independent and wants to get back to this. She was driving also Recent Psychosocial Issues: other health issues were managed she feels A-fib acted up again Psychiatric History: No issues-history deferred depression screen due to coping appropriately and verbalizing. She is not one to feel sorry for herself and will keep moving forward. Substance Abuse History: No issues  Patient / Family Perceptions, Expectations & Goals Pt/Family understanding of illness & functional limitations: Pt and daughter can explain her stroke and deficits. Both have spoken with the MD and feel encouraged and hopeful she wil do well here on rehab. Premorbid pt/family roles/activities: Mom, grandmother, retiree, church member, friend, etc Anticipated changes in roles/activities/participation: resume Pt/family expectations/goals: Pt states: " I want to be able to take care of myself when I leave here."  Daughter states: " We will work on what she needs at discharge and do it but she doesn't like asking for help."  [employer]  Agencies: None Premorbid Home Care/DME Agencies: Other (Comment) (rollator, cane tub seat) Transportation available at discharge: pt drove PTA, will need to rely upon family now  Discharge Planning Living Arrangements: [place]: Children,Other relatives,Friends/neighbors,Church/faith community Type of Residence: Private residence Insurance Resources: Multimedia programmer (specify) Primary school teacher) Financial Resources: Radio broadcast assistant Screen Referred: No Living Expenses: Own Money Management: Patient Does the patient have any problems obtaining your medications?: No Home Management: Patient Patient/Family Preliminary Plans: Return home with daughter and grandson coming by to assist. Daughter waiting for goals set and what Mom will need at discharge. Aware team setting goals and ELOS today. Option for other daughter to come back to assist from [place]. Care Coordinator Barriers to Discharge: Insurance for SNF coverage Care Coordinator Anticipated Follow Up Needs: HH/OP  Clinical Impression Pleasant hard working very independent 87 year old woman who wants to get back home and be able to be alone and do on her own. Her daughter-Katrina Randolph is local and can check on frequently unsure about staying with her. Will await therapy goals today. Work on discharge needs.  Elease Hashimoto 06/06/2020, 11:24 AM

## 2020-06-07 DIAGNOSIS — R7303 Prediabetes: Secondary | ICD-10-CM

## 2020-06-07 NOTE — Patient Care Conference (Signed)
Inpatient RehabilitationTeam Conference and Plan of Care Update Date: 06/07/2020   Time: 11:11 AM    Patient Name: Katrina Randolph      Medical Record Number: [id number]  Date of Birth: Jun 30, 1930 Sex: Female         Room/Bed: 4W25C/4W25C-01 Payor Info: Payor: Theme park manager MEDICARE / Plan: [facility] MEDICARE / Product Type: *No Product type* /    Admit Date/Time:  06/05/2020  3:52 PM  Primary Diagnosis:  Left middle cerebral artery stroke [facility])  Hospital Problems: Principal Problem:   Left middle cerebral artery stroke [facility]) Active Problems:   Expressive aphasia   Atrial fibrillation [facility])   Prediabetes    Expected Discharge Date: Expected Discharge Date: 06/14/20  Team Members Present: Physician leading conference: Dr. Delice Lesch Care Coodinator Present: Dorien Chihuahua, RN, BSN, CRRN;Becky Dupree, LCSW Nurse Present: Dorien Chihuahua, RN PT Present: Ginnie Smart, PT OT Present: Meriel Pica, OT SLP Present: Nadara Mode, SLP PPS Coordinator present : Ileana Ladd, PT     Current Status/Progress Goal Weekly Team Focus  Bowel/Bladder   continent of B/B. Last BM-4/24  remain continent  assess q shift and PRN   Swallow/Nutrition/ Hydration             ADL's   supervision with self care, CGA with mobility  mod I to independent  ADL training, balance, R side coordination, pt/family  education   Mobility   supervision bed mobility, CGA transfers, gait minA and no AD ~168ft.  Mod I  Gait training with LRAD, higher level balance, DC planning, pt education   Communication   Mod A, complex commands and word finding in conversation  Min A (hopefully will reach Supervision A but set low due to short ELOS)  complex problem solving/language in functional tasks and word finding strategies   Safety/Cognition/ Behavioral Observations            Pain   c/o moderate pain ( general)  pain<3  assess qshift and PRN   Skin   scattered eccymosis  remain intack  assess q shift  and PRN     Discharge Planning:  HOme with daughter who lives close by to check in on frequently. Discussed possible need for 24/7 supervision due to speech issues and daughter voiced she and pt will work this out   Team Discussion: Mood stable, transient pain left side, occasional lightheadedness reported. Communication has improved; able to express needs.  Patient on target to meet rehab goals: yes, currently close supervision - CGA for stand pivot and 4 steps. Able to ambulate 100' with min assist without a device. Requires close supervision for self care and toileting. Independent to mod I assist goals set for discharge  *See Care Plan and progress notes for long and short-term goals.   Revisions to Treatment Plan:   Teaching Needs: Toileting, transfers, communication and complex home management assistance, secondary stroke risk management, medications, etc.  Current Barriers to Discharge: Decreased caregiver support and Home enviroment access/layout  Possible Resolutions to Barriers: Education with daughter    Medical Summary Current Status: Aphasia as well as gait abnormality secondary to left MCA territory infarction  Barriers to Discharge: Medical stability;Other (comments)  Barriers to Discharge Comments: Aphasia Possible Resolutions to Barriers/Weekly Focus: Therapies, optimize heart meds, follow labs- Cr.   Continued Need for Acute Rehabilitation Level of Care: The patient requires daily medical management by a physician with specialized training in physical medicine and rehabilitation for the following reasons: Direction of a multidisciplinary physical  rehabilitation program to maximize functional independence : Yes Medical management of patient stability for increased activity during participation in an intensive rehabilitation regime.: Yes Analysis of laboratory values and/or radiology reports with any subsequent need for medication adjustment and/or medical  intervention. : Yes   I attest that I was present, lead the team conference, and concur with the assessment and plan of the team.   Dorien Chihuahua B 06/07/2020, 3:07 PM

## 2020-06-07 NOTE — Care Management Important Message (Signed)
Important Message  Patient Details  Name: Katrina Randolph MRN: [id number] Date of Birth: 1930/08/26   Medicare Important Message Given:  Yes  Patient left prior to IM delivery on 4/26 will mail to the patient home address.    Trinty Marken 06/07/2020, 11:22 AM

## 2020-06-07 NOTE — Progress Notes (Signed)
Physical Therapy Session Note  Patient Details  Name: Katrina Randolph MRN: [id number] Date of Birth: 1930/12/16  Today's Date: 06/07/2020 PT Individual Time: 0800-0912 PT Individual Time Calculation (min): 72 min   Short Term Goals: Week 1:  PT Short Term Goal 1 (Week 1): STG = LTG due to ELOS  Skilled Therapeutic Interventions/Progress Updates:    Pt greeted sleeping soundly in bed, awakens to voice and agreeable to PT session. No reports of pain but does report she's not usually a morning person and is having trouble to adjusting to "having a schedule" because she's 89 and has been retired for so long. Bed mobility completed with supervision. Sit<>stand with CGA and no AD and ambulated to the sink where she completed oral care with CGA for balance. Pt able to use her RUE while brushing teeth but needed min cues for removing/applying toothpaste cap. Also noted increased R listing/leaning during standing while brushing teeth, needing minA for correcting. Pt then reporting need to void, ambulated in bathroom with CGA and able to manage lower body clothes in standing with minA for balance. Pt continent of bladder, charted in flowsheets. W/c transport tto main reahb gym for time management.   BERG completed as outlined below. Rest breaks needed b/w activities due to fatigue and for instructions.  Patient demonstrates increased fall risk as noted by score of  27/56 on Berg Balance Scale.  (<36= high risk for falls, close to 100%; 37-45 significant >80%; 46-51 moderate >50%; 52-55 lower >25%)  Gait training 4x~145ft with minA and no AD - 1st gait trial pt demonstrates high guard UE positions with decreased gait speed and R listing. Cues for corrections and to "not overthink" gait which improved her 2nd gait trial but still required minA due to unsteadiness.   Stair training up/down 2x4 (seated rest break) 6inch steps with bilateral hand rails - requiring CGA for ascending and minA for descent.  1st trial, pt demonstrated self selected reciprocal stepping. Cues for step-to pattern for improved safety and steadiness during 2nd trial.  Wheeled back to room in w/c. Ambulated a few short feet with CGA and no AD to recliner. She remained seated in recliner with BLE elevated and chair alarm on, needs within reach.    Therapy Documentation Precautions:  Precautions Precautions: Fall Precaution Comments: Expressive > receptive aphasia Restrictions Weight Bearing Restrictions: No  Balance: Balance Balance Assessed: Yes Standardized Balance Assessment Standardized Balance Assessment: Berg Balance Test Berg Balance Test Sit to Stand: Able to stand without using hands and stabilize independently Standing Unsupported: Able to stand safely 2 minutes Sitting with Back Unsupported but Feet Supported on Floor or Stool: Able to sit safely and securely 2 minutes Stand to Sit: Sits safely with minimal use of hands Transfers: Able to transfer safely, definite need of hands Standing Unsupported with Eyes Closed: Able to stand 10 seconds safely Standing Ubsupported with Feet Together: Needs help to attain position and unable to hold for 15 seconds From Standing, Reach Forward with Outstretched Arm: Can reach forward >5 cm safely (2") From Standing Position, Pick up Object from Floor: Unable to try/needs assist to keep balance From Standing Position, Turn to Look Behind Over each Shoulder: Needs assist to keep from losing balance and falling Turn 360 Degrees: Needs close supervision or verbal cueing Standing Unsupported, Alternately Place Feet on Step/Stool: Able to complete >2 steps/needs minimal assist Standing Unsupported, One Foot in Front: Loses balance while stepping or standing Standing on One Leg: Unable to try or  needs assist to prevent fall Total Score: 27  Therapy/Group: Individual Therapy  Shilpa Bushee P Lark Langenfeld PT 06/07/2020, 7:36 AM

## 2020-06-07 NOTE — Progress Notes (Signed)
Pt self transferred from chair to bed. Assigned nurse notified. Pt educated on safety precautions. Bed alarm activated and call light in reach. Sheela Stack, LPN

## 2020-06-07 NOTE — Progress Notes (Signed)
Occupational Therapy Session Note  Patient Details  Name: Katrina Randolph MRN: [id number] Date of Birth: 18-Nov-1930  Today's Date: 06/07/2020 OT Individual Time: 1530-1626 OT Individual Time Calculation (min): 56 min    Short Term Goals: Week 1:  OT Short Term Goal 1 (Week 1): STGs = LTGs Week 2:     Skilled Therapeutic Interventions/Progress Updates:    Pt greeted at time of session supine in bed resting but easily woken and agreeable to OT session. Supine > sit Supervision and walked bed > bathroom CGA with RW and transferred to toilet same manner, CGA for clothing management and standing for hygiene. Walked bathroom > wheelchair CGA with RW and therapist transport to gym for time, focus initially on [facility] retraining for RUE, noted to have legible handwriting but expressive aphasia limiting and worked on word finding and translating into written language making a list of questions for MD tomorrow morning including questions about nerve pain, medications, etc. Stand pivot wheelchair > mat with HHA and poor safety noted. Focus on weight bearing 2x10 each: large therapy ball presses and lateral leans, weight bearing for NMR. Stand pivot mat > wheelchair > recliner HHA. Set up with alarm on call bell in reach.   Therapy Documentation Precautions:  Precautions Precautions: Fall Precaution Comments: Expressive > receptive aphasia Restrictions Weight Bearing Restrictions: No     Therapy/Group: Individual Therapy  Viona Gilmore 06/07/2020, 4:32 PM

## 2020-06-07 NOTE — Progress Notes (Signed)
Patient ID: Katrina Randolph, female   DOB: 11/10/1930, 87 y.o.   MRN: [id number]  Met with pt and spoke with daughter-Katrina Randolph to update both on team conference goals supervision level and target discharge date of 5/4. Pt felt it is too long and wants to get home. Discussed working on her speech issues and daughter aware reason for supervision at home due to speech issues, physically moving well, but for safety. Discussed follow up daughter reports pt does not want people in her home, can do OP. They live [facility] to [facility] will make referral to and have contact daughter to set up appointments.

## 2020-06-07 NOTE — Progress Notes (Signed)
[place] PHYSICAL MEDICINE & REHABILITATION PROGRESS NOTE  Subjective/Complaints: Patient seen sitting up, working with therapy this morning.  He states she slept well overnight.  She complains of intermittent transient right-sided pain as well as numbness.  Discussed with patient.  ROS: Denies CP, SOB, N/V/D  Objective: Vital Signs: Blood pressure 135/73, pulse 76, temperature 98.6 F (37 C), resp. rate 18, height 5\' 2"  (1.575 m), weight 74.7 kg, SpO2 98 %. No results found. Recent Labs    06/05/20 0254 06/06/20 0511  WBC 7.7 7.0  HGB 11.9* 12.6  HCT 38.9 40.5  PLT 187 175   Recent Labs    06/06/20 0511  NA 138  K 4.5  CL 104  CO2 26  GLUCOSE 121*  BUN 14  CREATININE 0.88  CALCIUM 9.2    Intake/Output Summary (Last 24 hours) at 06/07/2020 1127 Last data filed at 06/07/2020 0715 Gross per 24 hour  Intake 555 ml  Output --  Net 555 ml        Physical Exam: BP 135/73 (BP Location: Left Arm)   Pulse 76   Temp 98.6 F (37 C)   Resp 18   Ht 5\' 2"  (1.575 m)   Wt 74.7 kg   SpO2 98%   BMI 30.12 kg/m  Constitutional: No distress . Vital signs reviewed. HENT: Normocephalic.  Atraumatic. Eyes: EOMI. No discharge. Cardiovascular: No JVD.  Irregularly irregular. Respiratory: Normal effort.  No stridor.  Bilateral clear to auscultation. GI: Non-distended.  BS +. Skin: Warm and dry.  Scattered ecchymosis. Psych: Normal mood.  Normal behavior. Musc: No edema in extremities.  No tenderness in extremities. Neuro: Alert and oriented Mild expressive aphasia, unchanged Motor: RUE/RLE: 4+-5/5 proximal distal unchanged LUE/LE: 5/5 proximal distal Sensation diminished to light touch right side  Assessment/Plan: 1. Functional deficits which require 3+ hours per day of interdisciplinary therapy in a comprehensive inpatient rehab setting.  Physiatrist is providing close team supervision and 24 hour management of active medical problems listed below.  Physiatrist and  rehab team continue to assess barriers to discharge/monitor patient progress toward functional and medical goals   Care Tool:  Bathing    Body parts bathed by patient: Right arm,Left arm,Abdomen,Front perineal area,Chest,Buttocks,Right upper leg,Left upper leg,Right lower leg,Left lower leg,Face         Bathing assist Assist Level: Supervision/Verbal cueing     Upper Body Dressing/Undressing Upper body dressing   What is the patient wearing?: Pull over shirt    Upper body assist Assist Level: Supervision/Verbal cueing    Lower Body Dressing/Undressing Lower body dressing      What is the patient wearing?: Pants,Underwear/pull up     Lower body assist Assist for lower body dressing: Supervision/Verbal cueing     Toileting Toileting    Toileting assist Assist for toileting: Minimal Assistance - Patient > 75%     Transfers Chair/bed transfer  Transfers assist     Chair/bed transfer assist level: Contact Guard/Touching assist     Locomotion Ambulation   Ambulation assist      Assist level: Minimal Assistance - Patient > 75% Assistive device: No Device Max distance: 121ft   Walk 10 feet activity   Assist     Assist level: Contact Guard/Touching assist Assistive device: No Device   Walk 50 feet activity   Assist    Assist level: Minimal Assistance - Patient > 75% Assistive device: No Device    Walk 150 feet activity   Assist Walk 150 feet activity did  not occur: Safety/medical concerns (Elevated HR)         Walk 10 feet on uneven surface  activity   Assist Walk 10 feet on uneven surfaces activity did not occur:  (Ramp)   Assist level: Minimal Assistance - Patient > 75% Assistive device: Other (comment) (No devie)   Wheelchair     Assist Will patient use wheelchair at discharge?: No   Wheelchair activity did not occur: N/A         Wheelchair 50 feet with 2 turns activity    Assist    Wheelchair 50 feet with 2  turns activity did not occur: N/A       Wheelchair 150 feet activity     Assist  Wheelchair 150 feet activity did not occur: N/A        Medical Problem List and Plan: 1.  Aphasia as well as gait abnormality secondary to left MCA territory infarction  Continue CIR  Team conference today to discuss current and goals and coordination of care, home and environmental barriers, and discharge planning with nursing, case manager, and therapies. Please see conference note from today as well.  2.  Antithrombotics: -DVT/anticoagulation: Eliquis             -antiplatelet therapy: N/A 3. Pain Management: Tylenol as needed 4. Mood: Zoloft 50 mg daily             -antipsychotic agents: N/A 5. Neuropsych: This patient is capable of making decisions on her own behalf. 6. Skin/Wound Care: Routine skin checks 7. Fluids/Electrolytes/Nutrition: Routine in and outs 8.  Atrial fibrillation.  Cardiac rate controlled.  Continue Toprol 25 mg daily.  Continue Eliquis  Heart rate controlled on 4/27             Monitor with increased activity 9.  Hypothyroidism: Synthroid 10. Hyperlipidemia: Lipitor 11. Obesity: BMI 30.97: provide counseling  12. Anemia: Resolved  13. AKI: Resolved   Continue to encourage fluids 14.  Prediabetes  Monitor with increased mobility  LOS: 2 days A FACE TO FACE EVALUATION WAS PERFORMED  Monicia Tse Lorie Phenix 06/07/2020, 11:27 AM

## 2020-06-07 NOTE — Progress Notes (Signed)
Physical Therapy Session Note  Patient Details  Name: Miyo Aina MRN: [id number] Date of Birth: December 30, 1930  Today's Date: 06/07/2020 PT Individual Time: 1351-1420 PT Individual Time Calculation (min): 29 min   Short Term Goals: Week 1:  PT Short Term Goal 1 (Week 1): STG = LTG due to ELOS  Skilled Therapeutic Interventions/Progress Updates:    Patient received sitting up in recliner, agreeable to PT. She denies pain. She was able to transfer to wc via stand pivot with no AD and CGA. PT transporting patient in wc to therapy gym for time management and energy conservation. Attempting NuStep for modulated B LE reciprocal stepping; however, patient reports increased in L knee pain due to premorbid knee OA. Discontinued this task. Patient completing standing balance/endurance task with cog overlay sequencing numbers and then completing simple math equations. Patient requiring light CGA for balance and up to Max cuing for accuracy with math. Patient returning to room in wc, transferring to recliner via stand pivot with CGA. Chair alarm on, call light within reach.    Therapy Documentation Precautions:  Precautions Precautions: Fall Precaution Comments: Expressive > receptive aphasia Restrictions Weight Bearing Restrictions: No    Therapy/Group: Individual Therapy  Karoline Caldwell, PT, DPT, CBIS  06/07/2020, 7:54 AM

## 2020-06-07 NOTE — Progress Notes (Signed)
Speech Language Pathology Daily Session Note  Patient Details  Name: Katrina Randolph MRN: [id number] Date of Birth: 1930-04-29  Today's Date: 06/07/2020 SLP Individual Time: 1130-1200 SLP Individual Time Calculation (min): 30 min  Short Term Goals: Week 1: SLP Short Term Goal 1 (Week 1): STG=LTG due to short ELOS  Skilled Therapeutic Interventions: Skilled SLP intervention focused on reading comprehension with functional information and verbal expression. Pt read 14-16 sentence paragraph regarding medical situation and answered questions with min A verbal and visual cues. Divergent naming task with categories of min complexity completed with supervision A. She communicated wants/needs and thoughts adequtely with occasional phonemic paraphasias that she was ble to immeditely self correct. Educted pt on eliminating distrctions and re-reading information once home if she notices difficulty with understanding. Cont with therapy per plan of care.   Pain Pain Assessment Pain Scale: Faces Pain Score: 0-No pain Faces Pain Scale: No hurt Pain Intervention(s): Medication (See eMAR)  Therapy/Group: Individual Therapy  Katrina Randolph 06/07/2020, 11:57 AM

## 2020-06-08 DIAGNOSIS — R208 Other disturbances of skin sensation: Secondary | ICD-10-CM

## 2020-06-08 MED ORDER — GABAPENTIN 100 MG PO CAPS
100.0000 mg | ORAL_CAPSULE | Freq: Two times a day (BID) | ORAL | Status: DC
  Administered 2020-06-08 – 2020-06-11 (×7): 100 mg via ORAL
  Filled 2020-06-08 (×7): qty 1

## 2020-06-08 MED ORDER — MUSCLE RUB 10-15 % EX CREA
TOPICAL_CREAM | Freq: Three times a day (TID) | CUTANEOUS | Status: DC
  Filled 2020-06-08: qty 85

## 2020-06-08 NOTE — Progress Notes (Signed)
Speech Language Pathology Daily Session Note  Patient Details  Name: Katrina Randolph MRN: [id number] Date of Birth: 09-26-1930  Today's Date: 06/08/2020 SLP Individual Time: 0930-1015 SLP Individual Time Calculation (min): 45 min  Short Term Goals: Week 1: SLP Short Term Goal 1 (Week 1): STG=LTG due to short ELOS  Skilled Therapeutic Interventions: Skilled SLP intervention focused on expressive language. Pt wrote medications,purpose, and time taken using blank printed medication chart with mod A for spelling and comprehension of spoken letters spelled aloud to her. Sh reported noticeable decline in writing during this task. She completed picture description task with minimal phonemic paraphasias when formulating sentences. Increased phonemic paraphasias noted with open ended questions during session and when expressing her thoughts. Cont with therapy per plan of care.     Pain Pain Assessment Pain Scale: Faces Pain Score: 0-No pain Faces Pain Scale: No hurt  Therapy/Group: Individual Therapy  Darrol Poke Fabiano Ginley 06/08/2020, 10:12 AM

## 2020-06-08 NOTE — Progress Notes (Signed)
Occupational Therapy Session Note  Patient Details  Name: Katrina Randolph MRN: [id number] Date of Birth: 06/25/1930  Today's Date: 06/08/2020 OT Individual Time: 1105-1200 OT Individual Time Calculation (min): 55 min    Short Term Goals: Week 1:  OT Short Term Goal 1 (Week 1): STGs = LTGs  Skilled Therapeutic Interventions/Progress Updates:      Pt seen for BADL retraining of toileting, bathing, and dressing with a focus on balance and R side awareness.  Pt had increased R leg/foot pain that was very distracting for her.  She continued to complete mobility and self care with close S but did need to rely on RW for support.  She did drop items from R hand a few times.  She also needed cues for rest breaks,  As she can get breathless easily.  Pt resting in recliner with chair alarm on and all needs met.   Therapy Documentation Precautions:  Precautions Precautions: Fall Precaution Comments: Expressive > receptive aphasia Restrictions Weight Bearing Restrictions: No       Pain: Pain Assessment Pain Scale: Faces Pain Score: 0-No pain Faces Pain Scale: Hurts whole lot Pain Type: Acute pain Pain Location: Leg Pain Orientation: Right Pain Descriptors / Indicators: Radiating;Sharp;Shooting;Tingling (per pt "nerve pain ") Pain Onset: Gradual Pain Intervention(s): RN made aware;MD notified (Comment) ADL: ADL Grooming: Supervision/safety Where Assessed-Grooming: Standing at sink Upper Body Bathing: Supervision/safety Where Assessed-Upper Body Bathing: Shower Lower Body Bathing: Supervision/safety Where Assessed-Lower Body Bathing: Shower Upper Body Dressing: Supervision/safety Where Assessed-Upper Body Dressing: Chair Lower Body Dressing: Supervision/safety Where Assessed-Lower Body Dressing: Chair Toileting: Supervision/safety Where Assessed-Toileting: Glass blower/designer: Therapist, music Method: Counselling psychologist: Physiological scientist: Curator Method: Heritage manager: Grab bars   Therapy/Group: Individual Therapy  [place] 06/08/2020, 11:42 AM

## 2020-06-08 NOTE — Progress Notes (Addendum)
OT reports that RLE pain has been an issue today with patient reporting significant dysesthesias. Per notes, this has been ongoing and now appears to be affecting activity.  Will add low dose gabapentin bid as well as Sportscreme.

## 2020-06-08 NOTE — Progress Notes (Signed)
Physical Therapy Session Note  Patient Details  Name: Katrina Randolph MRN: [id number] Date of Birth: [birth year]  Today's Date: 06/08/2020 PT Individual Time: 1400-1529 PT Individual Time Calculation (min): 89 min   Short Term Goals: Week 1:  PT Short Term Goal 1 (Week 1): STG = LTG due to ELOS  Skilled Therapeutic Interventions/Progress Updates:    Pt greeted sitting in recliner, daughter at bedside. Pt agreeable to therpay session. She reports mild RLE peresthesis that have been bothering her today. MD is aware. Lengthy discussion held with daughter and patient regarding DC planning, role of f/u therapies, barriers to DC, and PT POC/goals. Sit<>stand with supervision from recliner and ambulated a few short feet with CGA and no AD to her w/c. W/c transport for time management to main rehab gym. Attempted to retrieve Rollator from DME closet but unfortunately there was none available. In ADL apartment, performed furniture transfers from low sitting sofa couch with supervision and no AD. Instructed pt to remember and retrieve 4 items in the kitchen. She was able to recall 1/4 items once we arrived in the kitchen and needed min cues to use strategies to locate the remaining items.   Gait training 151ft with CGA and no AD on level ground - demo's high guard position with UE's, wide BOS, and short bilateral step length. Introduced RW and she ambulated 2x63ft with minA and RW on level ground - she had x1 LOB to the R while turning to the L, requiring MOD A for recovery. LOB contributed to distractions in hallways and turning her head while ambulating. Cues for corrections, safety awareness, and normalizing gait pattern.   Repeated weighted sit<>stands, 4x5, while holding 2kg med ball, completed with CGA. Cues for producing power through hips and encouraging hip extension to achieve full upright.  Attached 4lb ankle weight to RLE. Gait with RW ~85ft with CGA with cues for increasing R step height  and length due to intermittent R toe drag. In // bars, completed standing hip abduction, bilaterally, 4lb ankle weight attached to RLE. Completed 3x10 reps. Wheeled back to room with totalA for energy conservation as pt reporting moderate fatigue.   Stand<>pivot transfer with CGA to bed and bed mobility completed with supervision. Pt remained supine in bed with bed alarm on and needs within reach.   Therapy Documentation Precautions:  Precautions Precautions: Fall Precaution Comments: Expressive > receptive aphasia Restrictions Weight Bearing Restrictions: No General:    Therapy/Group: Individual Therapy  Caro Brundidge P Khameron Gruenwald PT 06/08/2020, 7:36 AM

## 2020-06-08 NOTE — Progress Notes (Signed)
[facility] PHYSICAL MEDICINE & REHABILITATION PROGRESS NOTE  Subjective/Complaints: Patient seen laying in bed this morning.  She states she slept well overnight.  She states that she has a question about the medication, but cannot recall the name-encourage patient to write down when she thinks of it.  Later informed by therapy regarding lower extremity pain.  ROS: Denies CP, SOB, N/V/D  Objective: Vital Signs: Blood pressure 127/62, pulse 71, temperature 97.8 F (36.6 C), resp. rate 16, height 5\' 2"  (1.575 m), weight 74.7 kg, SpO2 98 %. No results found. Recent Labs    06/06/20 0511  WBC 7.0  HGB 12.6  HCT 40.5  PLT 175   Recent Labs    06/06/20 0511  NA 138  K 4.5  CL 104  CO2 26  GLUCOSE 121*  BUN 14  CREATININE 0.88  CALCIUM 9.2    Intake/Output Summary (Last 24 hours) at 06/08/2020 1409 Last data filed at 06/08/2020 0816 Gross per 24 hour  Intake 535 ml  Output --  Net 535 ml        Physical Exam: BP 127/62 (BP Location: Right Arm)   Pulse 71   Temp 97.8 F (36.6 C)   Resp 16   Ht 5\' 2"  (1.575 m)   Wt 74.7 kg   SpO2 98%   BMI 30.12 kg/m  Constitutional: No distress . Vital signs reviewed. HENT: Normocephalic.  Atraumatic. Eyes: EOMI. No discharge. Cardiovascular: No JVD.  Irregularly irregular. Respiratory: Normal effort.  No stridor.  Bilateral clear to auscultation. GI: Non-distended.  BS +. Skin: Warm and dry.  Scattered ecchymosis. Psych: Normal mood.  Normal behavior. Musc: No edema in extremities.  No tenderness in extremities. Neuro: Alert and oriented Mild expressive aphasia, stable Motor: RUE/RLE: 4+-5/5 proximal distal, stable LUE/LE: 5/5 proximal distal Sensation diminished to light touch right side  Assessment/Plan: 1. Functional deficits which require 3+ hours per day of interdisciplinary therapy in a comprehensive inpatient rehab setting.  Physiatrist is providing close team supervision and 24 hour management of active medical  problems listed below.  Physiatrist and rehab team continue to assess barriers to discharge/monitor patient progress toward functional and medical goals   Care Tool:  Bathing    Body parts bathed by patient: Right arm,Left arm,Abdomen,Front perineal area,Chest,Buttocks,Right upper leg,Left upper leg,Right lower leg,Left lower leg,Face         Bathing assist Assist Level: Supervision/Verbal cueing     Upper Body Dressing/Undressing Upper body dressing   What is the patient wearing?: Pull over shirt    Upper body assist Assist Level: Supervision/Verbal cueing    Lower Body Dressing/Undressing Lower body dressing      What is the patient wearing?: Pants,Underwear/pull up     Lower body assist Assist for lower body dressing: Supervision/Verbal cueing     Toileting Toileting    Toileting assist Assist for toileting: Supervision/Verbal cueing     Transfers Chair/bed transfer  Transfers assist     Chair/bed transfer assist level: Supervision/Verbal cueing     Locomotion Ambulation   Ambulation assist      Assist level: Minimal Assistance - Patient > 75% Assistive device: No Device Max distance: 180ft   Walk 10 feet activity   Assist     Assist level: Contact Guard/Touching assist Assistive device: No Device   Walk 50 feet activity   Assist    Assist level: Minimal Assistance - Patient > 75% Assistive device: No Device    Walk 150 feet activity   Assist  Walk 150 feet activity did not occur: Safety/medical concerns (Elevated HR)         Walk 10 feet on uneven surface  activity   Assist Walk 10 feet on uneven surfaces activity did not occur:  (Ramp)   Assist level: Minimal Assistance - Patient > 75% Assistive device: Other (comment) (No devie)   Wheelchair     Assist Will patient use wheelchair at discharge?: No   Wheelchair activity did not occur: N/A         Wheelchair 50 feet with 2 turns activity    Assist     Wheelchair 50 feet with 2 turns activity did not occur: N/A       Wheelchair 150 feet activity     Assist  Wheelchair 150 feet activity did not occur: N/A        Medical Problem List and Plan: 1.  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  Antithrombotics: -DVT/anticoagulation: Eliquis             -antiplatelet therapy: N/A 3. Pain Management: Tylenol as needed  Low-dose gabapentin started on 4/28 4. Mood: Zoloft 50 mg daily             -antipsychotic agents: N/A 5. Neuropsych: This patient is capable of making decisions on her own behalf. 6. Skin/Wound Care: Routine skin checks 7. Fluids/Electrolytes/Nutrition: Routine in and outs 8.  Atrial fibrillation.  Cardiac rate controlled.  Continue Toprol 25 mg daily.  Continue Eliquis  Heart rate controlled on 4/28             Monitor with increased activity 9.  Hypothyroidism: Synthroid 10. Hyperlipidemia: Lipitor 11. Obesity: BMI 30.97: provide counseling  12. Anemia: Resolved  13. AKI: Resolved   Continue to encourage fluids 14.  Prediabetes  Monitor with increased mobility  LOS: 3 days A FACE TO FACE EVALUATION WAS PERFORMED  Kdyn Vonbehren Lorie Phenix 06/08/2020, 2:09 PM

## 2020-06-09 NOTE — Progress Notes (Signed)
[facility] PHYSICAL MEDICINE & REHABILITATION PROGRESS NOTE  Subjective/Complaints: Complains of right sided electric pains that come on at times. Would like to continue pain medication.  Working on Industrial/product designer stable   ROS: Denies CP, SOB, N/V/D  Objective: Vital Signs: Blood pressure 123/63, pulse 66, temperature 98.1 F (36.7 C), temperature source Oral, resp. rate 14, height 5\' 2"  (1.575 m), weight 74.7 kg, SpO2 92 %. No results found. No results for input(s): WBC, HGB, HCT, PLT in the last 72 hours. No results for input(s): NA, K, CL, CO2, GLUCOSE, BUN, CREATININE, CALCIUM in the last 72 hours.  Intake/Output Summary (Last 24 hours) at 06/09/2020 1120 Last data filed at 06/09/2020 0835 Gross per 24 hour  Intake 596 ml  Output --  Net 596 ml        Physical Exam: BP 123/63 (BP Location: Right Arm)   Pulse 66   Temp 98.1 F (36.7 C) (Oral)   Resp 14   Ht 5\' 2"  (1.575 m)   Wt 74.7 kg   SpO2 92%   BMI 30.12 kg/m  Gen: no distress, normal appearing HEENT: oral mucosa pink and moist, NCAT Cardio: Reg rate Chest: normal effort, normal rate of breathing Abd: soft, non-distended Ext: no edema Psych: pleasant, normal affect Cardiovascular: No JVD.  Irregularly irregular. Respiratory: Normal effort.  No stridor.  Bilateral clear to auscultation. GI: Non-distended.  BS +. Skin: Warm and dry.  Scattered ecchymosis. Psych: Normal mood.  Normal behavior. Musc: No edema in extremities.  No tenderness in extremities. Neuro: Alert and oriented Mild expressive aphasia, stable Motor: RUE/RLE: 4+-5/5 proximal distal, stable LUE/LE: 5/5 proximal distal Sensation diminished to light touch right side  Assessment/Plan: 1. Functional deficits which require 3+ hours per day of interdisciplinary therapy in a comprehensive inpatient rehab setting.  Physiatrist is providing close team supervision and 24 hour management of active medical problems listed  below.  Physiatrist and rehab team continue to assess barriers to discharge/monitor patient progress toward functional and medical goals   Care Tool:  Bathing    Body parts bathed by patient: Right arm,Left arm,Abdomen,Front perineal area,Chest,Buttocks,Right upper leg,Left upper leg,Right lower leg,Left lower leg,Face         Bathing assist Assist Level: Supervision/Verbal cueing     Upper Body Dressing/Undressing Upper body dressing   What is the patient wearing?: Pull over shirt    Upper body assist Assist Level: Supervision/Verbal cueing    Lower Body Dressing/Undressing Lower body dressing      What is the patient wearing?: Pants,Underwear/pull up     Lower body assist Assist for lower body dressing: Supervision/Verbal cueing     Toileting Toileting    Toileting assist Assist for toileting: Supervision/Verbal cueing     Transfers Chair/bed transfer  Transfers assist     Chair/bed transfer assist level: Supervision/Verbal cueing     Locomotion Ambulation   Ambulation assist      Assist level: Minimal Assistance - Patient > 75% Assistive device: No Device Max distance: 165ft   Walk 10 feet activity   Assist     Assist level: Contact Guard/Touching assist Assistive device: No Device   Walk 50 feet activity   Assist    Assist level: Minimal Assistance - Patient > 75% Assistive device: No Device    Walk 150 feet activity   Assist Walk 150 feet activity did not occur: Safety/medical concerns (Elevated HR)         Walk 10 feet on uneven surface  activity   Assist Walk 10 feet on uneven surfaces activity did not occur:  (Ramp)   Assist level: Minimal Assistance - Patient > 75% Assistive device: Other (comment) (No devie)   Wheelchair     Assist Will patient use wheelchair at discharge?: No   Wheelchair activity did not occur: N/A         Wheelchair 50 feet with 2 turns activity    Assist    Wheelchair 50  feet with 2 turns activity did not occur: N/A       Wheelchair 150 feet activity     Assist  Wheelchair 150 feet activity did not occur: N/A        Medical Problem List and Plan: 1.  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  Impaired mobility -DVT/anticoagulation: Continue Eliquis             -antiplatelet therapy: N/A 3. Pain Management: Tylenol as needed  Low-dose gabapentin started on 4/28, continue, discussed that we can uptitrate if pain is severe.  4. Mood: Zoloft 50 mg daily             -antipsychotic agents: N/A 5. Neuropsych: This patient is capable of making decisions on her own behalf. 6. Skin/Wound Care: Routine skin checks 7. Fluids/Electrolytes/Nutrition: Routine in and outs 8.  Atrial fibrillation.  Cardiac rate controlled.  Contiune Toprol 25 mg daily.  Continue Eliquis  Heart rate controlled on 4/28             Monitor with increased activity 9.  Hypothyroidism: Synthroid 10. Hyperlipidemia: Lipitor 11. Obesity: BMI 30.97: provide counseling  12. Anemia: Resolved  13. AKI: Resolved   Continue to encourage fluids 14.  Prediabetes  Monitor with increased mobility  LOS: 4 days A FACE TO FACE EVALUATION WAS PERFORMED  Katrina Randolph Katrina Randolph 06/09/2020, 11:20 AM

## 2020-06-09 NOTE — Progress Notes (Addendum)
Speech Language Pathology Daily Session Note  Patient Details  Name: Katrina Randolph MRN: [id number] Date of Birth: 1930-08-15  Today's Date: 06/09/2020 SLP Individual Time: 4081-4481 SLP Individual Time Calculation (min): 45 min  Short Term Goals: Week 1: SLP Short Term Goal 1 (Week 1): STG=LTG due to short ELOS  Skilled Therapeutic Interventions:Skilled ST services focused on cognitive skills. SLP facilitated complex problem solving, error awareness and recall within checkbook witting tasks. Pt required mod A verbal cues for recall within task, mod A fade to min A verbal cues for error awareness and supervision A verbal cues for problem solving. SLP will continue task next session. Pt demonstrated little awareness of verbal errors during task and required supervision A verbal cues to correct written errors. Pt was left in room with call bell within reach and chair alarm set. Recommend to continue ST services.      Pain Pain Assessment Pain Score: 0-No pain  Therapy/Group: Individual Therapy  Katrina Randolph  [facility] 06/09/2020, 5:01 PM

## 2020-06-09 NOTE — Progress Notes (Signed)
Occupational Therapy Session Note  Patient Details  Name: Katrina Randolph MRN: [id number] Date of Birth: 03/07/1930  Today's Date: 06/09/2020 OT Individual Time: 0700-0757 OT Individual Time Calculation (min): 57 min    Short Term Goals: Week 1:  OT Short Term Goal 1 (Week 1): STGs = LTGs  Skilled Therapeutic Interventions/Progress Updates:    Pt received sat up in bed eating breakfast, agreeable to therapy. Reports RLE pain is "so so." Reports she would like to work on RUE/RLE before DC. Noted pt throughout session to not having dropped ADL items with R hand. Came to sitting EOB close S. Req to use RW this date. STS throughout session close S. Amb to closet in room close S. Pt able to gather clothing items and tidy up room CGA when bending down. Doffed/donned shirt in standing with CGA. Attempted to don pants in standing with small LOB req min A to correct, suggested pt sit down to don pants for increased safety and energy conservation. Completed donning pants close S. Completed grooming in standing at sink close S. HR before activity read at 82 bpm. W/c transport to and from gym 2/2 time management. Assessed B grip strength with following results:  R: 33, 39, 35; 36 lbs average L: 26, 26, 30:  27 lb average  9HPT: R: 33 secs L: 35 secs  Finally, completed 1x10 of the following with 2 lb dowel rod for improved RUE NMR, BUE strength, and activity tolerance: forward/backward circles, chest press, core twists, and biceps curls. Req mod demonstrational cuing throughout to accurately complete exercises. Amb transfer back to recliner close S.   Pt left seated in recliner with chair alarm engaged, call bell in reach, and all immediate needs met.    Therapy Documentation Precautions:  Precautions Precautions: Fall Precaution Comments: Expressive > receptive aphasia Restrictions Weight Bearing Restrictions: No Pain: see session note   ADL: See Care Tool for more  details.   Therapy/Group: Individual Therapy   A  MS, OTR/L  06/09/2020, 12:33 PM  

## 2020-06-09 NOTE — Progress Notes (Signed)
Physical Therapy Session Note  Patient Details  Name: Katrina Randolph MRN: [id number] Date of Birth: 1930-11-20  Today's Date: 06/09/2020 PT Individual Time: 1000-1053 + 1505-1530 PT Individual Time Calculation (min): 53 min  + 25 min  Short Term Goals: Week 1:  PT Short Term Goal 1 (Week 1): STG = LTG due to ELOS  Skilled Therapeutic Interventions/Progress Updates:     1st session: Pt greeted seated in recliner at start of session. No reports of pain. Does report fatigue from a busy morning (1st session at 0700). Sit<>stand with CGA and no AD from recliner and ambulated a few feet with CGA and no AD to w/c. W/c transport to main rehab gym for time management.  Gait training ~116ft with CGA and RW - a few instances of L toe drag and increased unsteadiness with turns. No formal LOB or knee buckling. Increased effort with R foot placement and pt needing to concentrate on L foot placement.   Repeated sit<>stands (x20) with CGA and no AD from lowered mat table height - task overlay for matching playing cards to back of mirror with instruction on verbalizing the card # and suit to work on her expressive aphasia. Pt needing mod/max cues for correct suit > number on the card and was 100% accurate with placement.  In standing with CGA, completed mild complex peg board puzzle which she completed without difficulty. She also completed sequencing of cards #1-#10 without difficulty as well. Pt reports that she struggled with her checkbook during earlier session this morning with OT vs SLP which was frustrating to her because she was able to handle this indep prior to her CVA.  Completed both seated and supine there-ex on mat table: -2x15 LAQ bilaterally with 4# ankle weight -2x10 bridges -1x10 hip abduction, bilaterally -1x10 heel slides bilaterally with 4# ankle weight *Therapist cueing for sequencing, muscle activation, and dosage throughout. Able to complete sit<>supine with supervision on  mat table.  Wheeled back to her room with totalA for time management. Ambulated short distances in the room with CGA and no AD. Able to kick off her shoes without difficulty while seated EOB and completed sit>supine with supervision. Pt remained supine in bed with bed alarm on and needs within reach.  2nd session: Pt greeted supine in bed, daughter at bedside. Discussed tentative DC date and conversation held with DC planning and considering extending DC date to maximize pt's indep and balance to get to mod I level. Both in agreement - reached out to team via secure chat to confirm. Bed mobility completed with supervision. Sit<>stand with supervision to RW and she ambulated from her room to main rehab gym with CGA and RW - intermittent R toe drag and increased lateral unsteadiness - cues for safety awareness, R foot placement and increasing R heel strike. NMR with blue foam air-ex pad with minA for balance. Worked on static standing with vs without UE support on high/low table. Then incorporated dynamic movements with chest press and shldr press 2lb dowel rod, again needing minA for balance. Ambulated back to her room with CGA and RW - decreased gait speed compared to 1st gait trial and increased effort towards the end. Bed mobility completed with supervision and she remained supine in bed with bed alarm on, needs within reach.   Therapy Documentation Precautions:  Precautions Precautions: Fall Precaution Comments: Expressive > receptive aphasia Restrictions Weight Bearing Restrictions: No General:    Therapy/Group: Individual Therapy  Nima Kemppainen P Hunt Zajicek PT 06/09/2020, 7:43 AM

## 2020-06-10 NOTE — Progress Notes (Signed)
Speech Language Pathology Daily Session Note  Patient Details  Name: Katrina Randolph MRN: [id number] Date of Birth: September 24, 1930  Today's Date: 06/10/2020 SLP Individual Time: 5009-3818 SLP Individual Time Calculation (min): 45 min  Short Term Goals: Week 1: SLP Short Term Goal 1 (Week 1): STG=LTG due to short ELOS  Skilled Therapeutic Interventions:Skilled ST services focused on cognitive skills. SLP facilitated complex problem solving and error awareness skills in familiar checkbook writing task, pt required min A verbal cues. Pt required max a verbal cues to recognize verbal errors during tasks and mod A verbal cues to correct mistakes clearly. SLP also facilitated mildly complex problem solving and recall skill in calendar task, pt required supervision A verbal cues for recall and problem solving skills with mod A verbal cues for error awareness.s Pt would cross out mistakes and then was unable to read hand witting in reflection, despite verbal cues to utilize eraser. SLP recommends completing calendar task in upcoming sessions. Pt was left in room with call bell within reach and chair alarm set. SLP recommends to continue skilled services.     Pain Pain Assessment Pain Scale: 0-10 Pain Score: 0-No pain Pain Type: Acute pain Pain Location: Neck Pain Orientation: Posterior Pain Descriptors / Indicators: Aching;Sore Pain Onset: Gradual Pain Intervention(s): Medication (See eMAR)  Therapy/Group: Individual Therapy  [name]  [facility] 06/10/2020, 3:41 PM

## 2020-06-10 NOTE — Progress Notes (Addendum)
Physical Therapy Session Note  Patient Details  Name: Katrina Randolph MRN: [id number] Date of Birth: [birth year]  Today's Date: 06/10/2020 PT Individual Time: 0902-1002; 5956-3875 PT Individual Time Calculation (min): 60 min , 35 min  Short Term Goals: Week 1:  PT Short Term Goal 1 (Week 1): STG = LTG due to ELOS  Skilled Therapeutic Interventions/Progress Updates:   Pt seated in recliner, awake and ready to participate.  She rated pain R hemibody 3/4, soreness.  She declined wearing TEDS.  Sit> stand /pivot to wc with CGA.    neuromuscular re-education activities via forced use, demo, multimodal cues for increased flexibility bil UEs, trunk, bil LEs using Nustep at level 4 x 3 minutes.  Seated- alternating ankle pumps with difficulty limiting mvt to ankles, often extending knees as well.  Seated without LE support, reaching out of BOS L/R activity to facilitate trunk shortening/lengthening, with limited R trunk lengthening noted.  Standing on Airex mat x 1 minute, with bil UE support>0UE support, with min/mod assist, unsteady and biased to L and with frequent A-P sway; 2nd trial with less unsteadiness but continued posterior sway with delayed balance strategies..  Standing during external perturbations backwards to facilitate balance strategies, with poor response R but slight improvement with practice. Bil hand activity, with gloves on, seated without LE support, wiping down both sides of bean bags with R hand and stacking with R hand, successfully.  Gait training with RW on level tile, x 250' x 150' with multiple turns, CGA.  Cues for safe hand placement sit>< stand.  At end of session, pt seated in recliner, reclined with bil LEs elevated, needs at hand, and seat pad alarm set.  tx 2:  Pt resting in recliner. Dtr present. Pt denied pain, but stated that she was tired.  Sit> stand with close supervision.  Gait training as above, to toilet. Toilet transfer using RW and wall bar, close  supervisoin.  Pt continent of B and B.  Peri care with supervision.  Pt needed cues to continue on toilet and finish BM, successfully.  Hand washing in standing with supervision.   Gait training up/down curb/step similar to pt's step from garage> into house, per dtr.  Using RW, x 2, pt needed min assist and mod cues 1st trial, min assist and min cues 2nd trial.   Balance retraining in standing with bil UE support for R/L toe tapping on floor targets arranged in 9>3 on clock.  Pt with difficulty following 3 -step color sequence, and had LOB due to over-shifting to R when tapping with L foot.  Pt unaware of LOBs to R.  Pt stated that she was fatigued and wanted to get into bed at end of session.  Sit> stand to RW CGA and into supine , supervision.  At end of session, pt resting with alarm set and needs at hand.       Therapy Documentation Precautions:  Precautions Precautions: Fall Precaution Comments: Expressive > receptive aphasia Restrictions Weight Bearing Restrictions: No       Therapy/Group: Individual Therapy  Marinda Tyer 06/10/2020, 10:08 AM

## 2020-06-10 NOTE — Progress Notes (Signed)
[place] PHYSICAL MEDICINE & REHABILITATION PROGRESS NOTE  Subjective/Complaints: Pain is better controlled with gabapentin, continue Eager to go home on Wednesday Vitals stable Pleased with progress with therapy   ROS: Denies CP, SOB, N/V/D, + right sided intermittent neuropathic pain  Objective: Vital Signs: Blood pressure 140/72, pulse 73, temperature 98.1 F (36.7 C), temperature source Oral, resp. rate 16, height 5\' 2"  (1.575 m), weight 74.7 kg, SpO2 96 %. No results found. No results for input(s): WBC, HGB, HCT, PLT in the last 72 hours. No results for input(s): NA, K, CL, CO2, GLUCOSE, BUN, CREATININE, CALCIUM in the last 72 hours. No intake or output data in the 24 hours ending 06/10/20 1135      Physical Exam: BP 140/72 (BP Location: Right Arm)   Pulse 73   Temp 98.1 F (36.7 C) (Oral)   Resp 16   Ht 5\' 2"  (1.575 m)   Wt 74.7 kg   SpO2 96%   BMI 30.12 kg/m  Gen: no distress, normal appearing HEENT: oral mucosa pink and moist, NCAT Cardio: Reg rate Cardiovascular: No JVD.  Irregularly irregular. Respiratory: Normal effort.  No stridor.  Bilateral clear to auscultation. GI: Non-distended.  BS +. Skin: Warm and dry.  Scattered ecchymosis. Psych: Normal mood.  Normal behavior. Musc: No edema in extremities.  No tenderness in extremities. Neuro: Alert and oriented Mild expressive aphasia, stable Motor: RUE/RLE: 4+-5/5 proximal distal, stable LUE/LE: 5/5 proximal distal Sensation diminished to light touch right side  Assessment/Plan: 1. Functional deficits which require 3+ hours per day of interdisciplinary therapy in a comprehensive inpatient rehab setting.  Physiatrist is providing close team supervision and 24 hour management of active medical problems listed below.  Physiatrist and rehab team continue to assess barriers to discharge/monitor patient progress toward functional and medical goals   Care Tool:  Bathing    Body parts bathed by patient:  Right arm,Left arm,Abdomen,Front perineal area,Chest,Buttocks,Right upper leg,Left upper leg,Right lower leg,Left lower leg,Face         Bathing assist Assist Level: Contact Guard/Touching assist     Upper Body Dressing/Undressing Upper body dressing   What is the patient wearing?: Pull over shirt    Upper body assist Assist Level: Set up assist Assistive Device Comment: seaed  Lower Body Dressing/Undressing Lower body dressing      What is the patient wearing?: Pants,Underwear/pull up     Lower body assist Assist for lower body dressing: Supervision/Verbal cueing     Toileting Toileting    Toileting assist Assist for toileting: Supervision/Verbal cueing     Transfers Chair/bed transfer  Transfers assist     Chair/bed transfer assist level: Contact Guard/Touching assist     Locomotion Ambulation   Ambulation assist      Assist level: Contact Guard/Touching assist Assistive device: Walker-rolling Max distance: 250   Walk 10 feet activity   Assist     Assist level: Contact Guard/Touching assist Assistive device: Walker-rolling   Walk 50 feet activity   Assist    Assist level: Contact Guard/Touching assist Assistive device: Walker-rolling    Walk 150 feet activity   Assist Walk 150 feet activity did not occur: Safety/medical concerns (Elevated HR)  Assist level: Contact Guard/Touching assist Assistive device: Walker-rolling    Walk 10 feet on uneven surface  activity   Assist Walk 10 feet on uneven surfaces activity did not occur:  (Ramp)   Assist level: Minimal Assistance - Patient > 75% Assistive device: Other (comment) (No devie)   Wheelchair  Assist Will patient use wheelchair at discharge?: No   Wheelchair activity did not occur: N/A         Wheelchair 50 feet with 2 turns activity    Assist    Wheelchair 50 feet with 2 turns activity did not occur: N/A       Wheelchair 150 feet activity      Assist  Wheelchair 150 feet activity did not occur: N/A        Medical Problem List and Plan: 1.  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  Impaired mobility -DVT/anticoagulation: Continue Eliquis             -antiplatelet therapy: N/A 3. Pain Management: Tylenol as needed  Low-dose gabapentin started on 4/28, continue, discussed that we can uptitrate if pain is severe, better controlled now.   4. Mood: Zoloft 50 mg daily             -antipsychotic agents: N/A 5. Neuropsych: This patient is capable of making decisions on her own behalf. 6. Skin/Wound Care: Routine skin checks 7. Fluids/Electrolytes/Nutrition: Routine in and outs 8.  Atrial fibrillation.  Cardiac rate controlled.  Continue Toprol 25 mg daily.  Continue Eliquis  Heart rate controlled on 4/30             Monitor with increased activity 9.  Hypothyroidism: Synthroid 10. Hyperlipidemia: Lipitor 11. Obesity: BMI 30.97: provide counseling  12. Anemia: Resolved  13. AKI: Resolved   Continue to encourage fluids 14.  Prediabetes  Monitor with increased mobility 15. Low total protein: counsel regarding high protein foods  LOS: 5 days A FACE TO FACE EVALUATION WAS PERFORMED  Martha Clan P Donavan Kerlin 06/10/2020, 11:35 AM

## 2020-06-10 NOTE — Progress Notes (Signed)
Occupational Therapy Session Note  Patient Details  Name: Katrina Randolph MRN: [id number] Date of Birth: September 08, 1930  Today's Date: 06/10/2020 OT Individual Time: 9396-8864 OT Individual Time Calculation (min): 53 min    Short Term Goals: Week 1:  OT Short Term Goal 1 (Week 1): STGs = LTGs  Skilled Therapeutic Interventions/Progress Updates:     Pt received seated EOB finishing breakfast, agreeable to therapy. Denies pain. STS with RW with close, amb at room level with RW + CGA. Toilet transfer and walk-in shower transfer CGA, completes LB clothing management and seated pericare close S. Cont void of b/b. In shower, bathed full-body with CGA with STS and use of grab bars. Emphasized energy conservation and falls prevention strategies upon DC home (use shower chair, gather all necessary items before shower, and get dressed seated). Pt is able to don/doff shirt/underwear/pants close S when using chair. Additionally, gathered clothing items from cabinet CGA, although req A to locate items on bottom shelf. Completed seated grooming set-up A at sink. W/c transport to and from therapy kitchen 2/2 time management. Reviewed energy conservation strategies and home set-up for kitchen/grocery shopping management upon DC. Pt additionally able to identify 5 hazards in kitchen with min VCs (open cabinets, fall hazards, running water, etc.). Provided w/c bag to increase safety with transporting items. Amb transfer back to recliner CGA.   Pt left in recliner with chair alarm engaged, call bell in reach, and all immediate needs met.   Therapy Documentation Precautions:  Precautions Precautions: Fall Precaution Comments: Expressive > receptive aphasia Restrictions Weight Bearing Restrictions: No Pain:   denies ADL: See Care Tool for more details.  Therapy/Group: Individual Therapy  Volanda Napoleon MS, OTR/L  06/10/2020, 7:57 AM

## 2020-06-11 MED ORDER — GABAPENTIN 100 MG PO CAPS
100.0000 mg | ORAL_CAPSULE | Freq: Three times a day (TID) | ORAL | Status: DC
  Administered 2020-06-11 – 2020-06-14 (×8): 100 mg via ORAL
  Filled 2020-06-11 (×8): qty 1

## 2020-06-11 NOTE — Progress Notes (Signed)
[place] PHYSICAL MEDICINE & REHABILITATION PROGRESS NOTE  Subjective/Complaints: Patient's chart reviewed- No issues reported overnight Vitals signs stable Continues to have right sided neuropathic pain. Discussed increasing gabapentin and she is agreeable   ROS: Denies CP, SOB, N/V/D, + right sided intermittent neuropathic pain  Objective: Vital Signs: Blood pressure 122/69, pulse 65, temperature 97.8 F (36.6 C), resp. rate 17, height 5\' 2"  (1.575 m), weight 74.7 kg, SpO2 95 %. No results found. No results for input(s): WBC, HGB, HCT, PLT in the last 72 hours. No results for input(s): NA, K, CL, CO2, GLUCOSE, BUN, CREATININE, CALCIUM in the last 72 hours.  Intake/Output Summary (Last 24 hours) at 06/11/2020 1129 Last data filed at 06/10/2020 1900 Gross per 24 hour  Intake 177 ml  Output --  Net 177 ml        Physical Exam: BP 122/69 (BP Location: Right Arm)   Pulse 65   Temp 97.8 F (36.6 C)   Resp 17   Ht 5\' 2"  (1.575 m)   Wt 74.7 kg   SpO2 95%   BMI 30.12 kg/m  Gen: no distress, normal appearing HEENT: oral mucosa pink and moist, NCAT Cardio: Irregularly irregular without JVD or murmur Chest: normal effort, normal rate of breathing Abd: soft, non-distended Ext: no edema Psych: pleasant, normal affect Skin: Warm and dry.  Scattered ecchymosis. Psych: Normal mood.  Normal behavior. Musc: No edema in extremities.  No tenderness in extremities. Neuro: Alert and oriented Mild expressive aphasia, stable Motor: RUE/RLE: 4+-5/5 proximal distal, stable LUE/LE: 5/5 proximal distal Sensation diminished to light touch right side  Assessment/Plan: 1. Functional deficits which require 3+ hours per day of interdisciplinary therapy in a comprehensive inpatient rehab setting.  Physiatrist is providing close team supervision and 24 hour management of active medical problems listed below.  Physiatrist and rehab team continue to assess barriers to discharge/monitor  patient progress toward functional and medical goals   Care Tool:  Bathing    Body parts bathed by patient: Right arm,Left arm,Abdomen,Front perineal area,Chest,Buttocks,Right upper leg,Left upper leg,Right lower leg,Left lower leg,Face         Bathing assist Assist Level: Contact Guard/Touching assist     Upper Body Dressing/Undressing Upper body dressing   What is the patient wearing?: Pull over shirt    Upper body assist Assist Level: Set up assist Assistive Device Comment: seaed  Lower Body Dressing/Undressing Lower body dressing      What is the patient wearing?: Pants,Underwear/pull up     Lower body assist Assist for lower body dressing: Supervision/Verbal cueing     Toileting Toileting    Toileting assist Assist for toileting: Supervision/Verbal cueing     Transfers Chair/bed transfer  Transfers assist     Chair/bed transfer assist level: Contact Guard/Touching assist     Locomotion Ambulation   Ambulation assist      Assist level: Contact Guard/Touching assist Assistive device: Walker-rolling Max distance: 250   Walk 10 feet activity   Assist     Assist level: Contact Guard/Touching assist Assistive device: Walker-rolling   Walk 50 feet activity   Assist    Assist level: Contact Guard/Touching assist Assistive device: Walker-rolling    Walk 150 feet activity   Assist Walk 150 feet activity did not occur: Safety/medical concerns (Elevated HR)  Assist level: Contact Guard/Touching assist Assistive device: Walker-rolling    Walk 10 feet on uneven surface  activity   Assist Walk 10 feet on uneven surfaces activity did not occur:  (Ramp)  Assist level: Minimal Assistance - Patient > 75% Assistive device: Other (comment) (No devie)   Wheelchair     Assist Will patient use wheelchair at discharge?: No   Wheelchair activity did not occur: N/A         Wheelchair 50 feet with 2 turns activity    Assist     Wheelchair 50 feet with 2 turns activity did not occur: N/A       Wheelchair 150 feet activity     Assist  Wheelchair 150 feet activity did not occur: N/A        Medical Problem List and Plan: 1.  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  Impaired mobility -DVT/anticoagulation: Continue Eliquis             -antiplatelet therapy: N/A 3. Right sided neuropathic pain: Tylenol as needed  Increase gabapentin to 100mg  TID  4. Mood: Zoloft 50 mg daily             -antipsychotic agents: N/A 5. Neuropsych: This patient is capable of making decisions on her own behalf. 6. Skin/Wound Care: Routine skin checks 7. Fluids/Electrolytes/Nutrition: Routine in and outs 8.  Atrial fibrillation.  Cardiac rate controlled.  Continue Toprol 25 mg daily.  Continue Eliquis  Heart rate controlled on 5/1             Monitor with increased activity 9.  Hypothyroidism: Continue Synthroid 10. Hyperlipidemia: Lipitor 11. Obesity: BMI 30.97: provide counseling  12. Anemia: Resolved  13. AKI: Resolved   Continue to encourage fluids 14.  Prediabetes  Monitor with increased mobility 15. Low total protein: counsel regarding high protein foods  LOS: 6 days A FACE TO FACE EVALUATION WAS PERFORMED  Rajendra Spiller P Lyndel Dancel 06/11/2020, 11:29 AM

## 2020-06-12 NOTE — Progress Notes (Signed)
[place] PHYSICAL MEDICINE & REHABILITATION PROGRESS NOTE  Subjective/Complaints: Patient seen sitting up working with therapy this morning.  She states she slept fairly overnight, which is her baseline.  She notes she had a headache this morning.  She also notes limited fluid intake, encouraged fluids.  Discussed right-sided pain with patient, however patient does not want to make any further adjustments today.   ROS: Denies CP, SOB, N/V/D.  Objective: Vital Signs: Blood pressure 140/70, pulse 67, temperature 97.6 F (36.4 C), resp. rate 18, height 5\' 2"  (1.575 m), weight 74.7 kg, SpO2 97 %. No results found. No results for input(s): WBC, HGB, HCT, PLT in the last 72 hours. No results for input(s): NA, K, CL, CO2, GLUCOSE, BUN, CREATININE, CALCIUM in the last 72 hours.  Intake/Output Summary (Last 24 hours) at 06/12/2020 1557 Last data filed at 06/12/2020 1336 Gross per 24 hour  Intake 760 ml  Output --  Net 760 ml        Physical Exam: BP 140/70 (BP Location: Right Arm)   Pulse 67   Temp 97.6 F (36.4 C)   Resp 18   Ht 5\' 2"  (1.575 m)   Wt 74.7 kg   SpO2 97%   BMI 30.12 kg/m  Constitutional: No distress . Vital signs reviewed. HENT: Normocephalic.  Atraumatic. Eyes: EOMI. No discharge. Cardiovascular: No JVD.  RRR. Respiratory: Normal effort.  No stridor.  Bilateral clear to auscultation. GI: Non-distended.  BS +. Skin: Warm and dry.  Scattered ecchymosis Psych: Normal mood.  Normal behavior. Musc: No edema in extremities.  No tenderness in extremities. Neuro: Alert and oriented Mild expressive aphasia, unchanged Motor: RUE/RLE: 4+-5/5 proximal distal, unchanged LUE/LE: 5/5 proximal distal Sensation diminished to light touch right side  Assessment/Plan: 1. Functional deficits which require 3+ hours per day of interdisciplinary therapy in a comprehensive inpatient rehab setting.  Physiatrist is providing close team supervision and 24 hour management of active  medical problems listed below.  Physiatrist and rehab team continue to assess barriers to discharge/monitor patient progress toward functional and medical goals   Care Tool:  Bathing    Body parts bathed by patient: Right arm,Left arm,Chest,Abdomen,Front perineal area,Right upper leg,Buttocks,Left upper leg,Right lower leg,Left lower leg,Face         Bathing assist Assist Level: Supervision/Verbal cueing     Upper Body Dressing/Undressing Upper body dressing   What is the patient wearing?: Pull over shirt    Upper body assist Assist Level: Set up assist Assistive Device Comment: seaed  Lower Body Dressing/Undressing Lower body dressing      What is the patient wearing?: Underwear/pull up,Pants     Lower body assist Assist for lower body dressing: Supervision/Verbal cueing     Toileting Toileting    Toileting assist Assist for toileting: Supervision/Verbal cueing     Transfers Chair/bed transfer  Transfers assist     Chair/bed transfer assist level: Contact Guard/Touching assist     Locomotion Ambulation   Ambulation assist      Assist level: Contact Guard/Touching assist Assistive device: Walker-rolling Max distance: 250   Walk 10 feet activity   Assist     Assist level: Contact Guard/Touching assist Assistive device: Walker-rolling   Walk 50 feet activity   Assist    Assist level: Contact Guard/Touching assist Assistive device: Walker-rolling    Walk 150 feet activity   Assist Walk 150 feet activity did not occur: Safety/medical concerns (Elevated HR)  Assist level: Contact Guard/Touching assist Assistive device: Walker-rolling  Walk 10 feet on uneven surface  activity   Assist Walk 10 feet on uneven surfaces activity did not occur:  (Ramp)   Assist level: Minimal Assistance - Patient > 75% Assistive device: Other (comment) (No devie)   Wheelchair     Assist Will patient use wheelchair at discharge?: No    Wheelchair activity did not occur: N/A         Wheelchair 50 feet with 2 turns activity    Assist    Wheelchair 50 feet with 2 turns activity did not occur: N/A       Wheelchair 150 feet activity     Assist  Wheelchair 150 feet activity did not occur: N/A        Medical Problem List and Plan: 1.  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  Impaired mobility -DVT/anticoagulation: Continue Eliquis             -antiplatelet therapy: N/A 3. Right sided neuropathic pain: Tylenol as needed  Increased gabapentin to 100mg  TID   Relatively controlled on 5/2, patient does not want to make any further changes present. 4. Mood: Zoloft 50 mg daily             -antipsychotic agents: N/A 5. Neuropsych: This patient is capable of making decisions on her own behalf. 6. Skin/Wound Care: Routine skin checks 7. Fluids/Electrolytes/Nutrition: Routine in and outs 8.  Atrial fibrillation.  Cardiac rate controlled.  Continue Toprol 25 mg daily.  Continue Eliquis  Heart rate controlled on 5/2             Monitor with increased activity 9.  Hypothyroidism: Continue Synthroid 10. Hyperlipidemia: Lipitor 11. Obesity: BMI 30.97: provide counseling  12. Anemia: Resolved  13. AKI: Resolved   Continue to encourage fluids 14.  Prediabetes  Elevated on 4/26, labs ordered for tomorrow  Monitor with increased mobility 15. Low total protein: counsel regarding high protein foods  LOS: 7 days A FACE TO FACE EVALUATION WAS PERFORMED  Dawnmarie Breon Lorie Phenix 06/12/2020, 3:57 PM

## 2020-06-12 NOTE — Plan of Care (Signed)
  Problem: RH Balance Goal: LTG Patient will maintain dynamic standing with ADLs (OT) Description: LTG:  Patient will maintain dynamic standing balance with assist during activities of daily living (OT)  Flowsheets (Taken 06/12/2020 1259) LTG: Pt will maintain dynamic standing balance during ADLs with: (LTG downgraded due to balance deficits.) Supervision/Verbal cueing Note: LTG downgraded due to balance deficits.   Problem: RH Bathing Goal: LTG Patient will bathe all body parts with assist levels (OT) Description: LTG: Patient will bathe all body parts with assist levels (OT) Flowsheets (Taken 06/12/2020 1259) LTG: Pt will perform bathing with assistance level/cueing: (LTG downgraded due to balance deficits.) Supervision/Verbal cueing Note: LTG downgraded due to balance deficits.   Problem: RH Dressing Goal: LTG Patient will perform lower body dressing w/assist (OT) Description: LTG: Patient will perform lower body dressing with assist, with/without cues in positioning using equipment (OT) Flowsheets (Taken 06/12/2020 1259) LTG: Pt will perform lower body dressing with assistance level of: (LTG downgraded due to balance deficits.) Supervision/Verbal cueing Note: LTG downgraded due to balance deficits.   Problem: RH Toileting Goal: LTG Patient will perform toileting task (3/3 steps) with assistance level (OT) Description: LTG: Patient will perform toileting task (3/3 steps) with assistance level (OT)  Flowsheets (Taken 06/12/2020 1259) LTG: Pt will perform toileting task (3/3 steps) with assistance level: (LTG downgraded due to balance deficits.) Supervision/Verbal cueing Note: LTG downgraded due to balance deficits.   Problem: RH Simple Meal Prep Goal: LTG Patient will perform simple meal prep w/assist (OT) Description: LTG: Patient will perform simple meal prep with assistance, with/without cues (OT). Flowsheets (Taken 06/12/2020 1259) LTG: Pt will perform simple meal prep with assistance  level of: (LTG downgraded due to balance deficits.) Supervision/Verbal cueing Note: LTG downgraded due to balance deficits.   Problem: RH Laundry Goal: LTG Patient will perform laundry w/assist, cues (OT) Description: LTG: Patient will perform laundry with assistance, with/without cues (OT). Flowsheets (Taken 06/12/2020 1259) LTG: Pt will perform laundry with assistance level of: (LTG downgraded due to balance deficits.) Supervision/Verbal cueing Note: LTG downgraded due to balance deficits.   Problem: RH Light Housekeeping Goal: LTG Patient will perform light housekeeping w/assist (OT) Description: LTG: Patient will perform light housekeeping with assistance, with/without cues (OT). Flowsheets (Taken 06/12/2020 1259) LTG: Pt will perform light housekeeping with assistance level of: (LTG downgraded due to balance deficits.) Supervision/Verbal cueing Note: LTG downgraded due to balance deficits.   Problem: RH Toilet Transfers Goal: LTG Patient will perform toilet transfers w/assist (OT) Description: LTG: Patient will perform toilet transfers with assist, with/without cues using equipment (OT) Flowsheets (Taken 06/12/2020 1259) LTG: Pt will perform toilet transfers with assistance level of: (LTG downgraded due to balance deficits.) Supervision/Verbal cueing Note: LTG downgraded due to balance deficits.   Problem: RH Tub/Shower Transfers Goal: LTG Patient will perform tub/shower transfers w/assist (OT) Description: LTG: Patient will perform tub/shower transfers with assist, with/without cues using equipment (OT) Flowsheets (Taken 06/12/2020 1259) LTG: Pt will perform tub/shower stall transfers with assistance level of: (LTG downgraded due to balance deficits.) Supervision/Verbal cueing Note: LTG downgraded due to balance deficits.

## 2020-06-12 NOTE — Progress Notes (Signed)
Physical Therapy Session Note  Patient Details  Name: Katrina Randolph MRN: [id number] Date of Birth: [birth year]  Today's Date: 06/12/2020 PT Individual Time: 1000-1029 + 1445 - 1526 PT Individual Time Calculation (min): 29 min  + 32min  Short Term Goals: Week 1:  PT Short Term Goal 1 (Week 1): STG = LTG due to ELOS  Skilled Therapeutic Interventions/Progress Updates:     1st session: Pt greeted seated in recliner at start of session, agreeable to PT therapy. No reports of pain. Pt's daughter brought in her rollator from home. Adjusted rollator to fit. Educated pt on rollator brakes and functions. Pt performed sit<>stand from recliner to rollator with supervision - needs min cues for locking brakes.   Gait training 138ft + 244ft + 228ft + 124ft (seated rest breaks) with CGA and RW. x3 instances of R toe drag with self corrected LOB and x1 instance of R lateral lean during turns that was self corrected. Cues throughout for self pacing, safety awareness, self monitoring of R foot placement/clearance. Towards the end of session, pt with improved safety awareness with brake locking of rollator during sit<>stands and transitions.   Pt remained seated in recliner with chair alarm on and at end of session and needs within reach.   2nd session: Pt greeted supine in bed at start of session, no reports of pain and agreeable to therapy. Bed mobility completed mod I. Donned slip-on shoes while seated EOB with setupA. Sit<>stand to rollator with supervision. She ambulated from her room to main rehab gym, ~251ft, with close supervision and rollator - unsteadiness with turns and narrow BS - cues for increased and widening base of support as well as improving her awareness with R foot clearance. Completed NMR for standing balance - feet apart with cone stacking on level ground with CGA - feet together with cone stacking on level ground with CGA - feet semi-tandem with cone stacking on level ground with  minA - feet apart while completing mild complex puzzle on blue air-ex foam pad with minA - feet apart with unilateral bicep curls with 3lb dumbbell on blue air-ex foam pad with minA for balance.  Ambulated back to her room with supervision and rollator with similar cues as outlined above, ~237ft. Ended session seated in recliner with chair alarm on and needs within reach. Updated her on tomorrow's schedule and to have her daughter come in for family training/education. Pt understanding.  Therapy Documentation Precautions:  Precautions Precautions: Fall Precaution Comments: Expressive > receptive aphasia Restrictions Weight Bearing Restrictions: No General:    Therapy/Group: Individual Therapy  Alysha Doolan P Griffey Nicasio PT 06/12/2020, 7:49 AM

## 2020-06-12 NOTE — Plan of Care (Signed)
  Problem: RH Balance Goal: LTG Patient will maintain dynamic standing balance (PT) Description: LTG:  Patient will maintain dynamic standing balance with assistance during mobility activities (PT) Flowsheets (Taken 06/12/2020 1258) LTG: Pt will maintain dynamic standing balance during mobility activities with:: Supervision/Verbal cueing Note: Downgraded due to slower than expected progress and RLE proprioceptive/sensory deficits.   Problem: RH Bed to Chair Transfers Goal: LTG Patient will perform bed/chair transfers w/assist (PT) Description: LTG: Patient will perform bed to chair transfers with assistance (PT). Flowsheets (Taken 06/12/2020 1258) LTG: Pt will perform Bed to Chair Transfers with assistance level: Supervision/Verbal cueing Note: Downgraded due to slower than expected progress and RLE proprioceptive/sensory deficits.   Problem: RH Car Transfers Goal: LTG Patient will perform car transfers with assist (PT) Description: LTG: Patient will perform car transfers with assistance (PT). Flowsheets (Taken 06/12/2020 1258) LTG: Pt will perform car transfers with assist:: Contact Guard/Touching assist Note: Downgraded due to slower than expected progress and RLE proprioceptive/sensory deficits.   Problem: RH Ambulation Goal: LTG Patient will ambulate in controlled environment (PT) Description: LTG: Patient will ambulate in a controlled environment, # of feet with assistance (PT). Flowsheets (Taken 06/12/2020 1258) LTG: Pt will ambulate in controlled environ  assist needed:: Supervision/Verbal cueing LTG: Ambulation distance in controlled environment: 138ft Note: Downgraded due to slower than expected progress and RLE proprioceptive/sensory deficits. Goal: LTG Patient will ambulate in home environment (PT) Description: LTG: Patient will ambulate in home environment, # of feet with assistance (PT). Flowsheets (Taken 06/12/2020 1258) LTG: Pt will ambulate in home environ  assist needed::  Supervision/Verbal cueing LTG: Ambulation distance in home environment: 86ft Note: Downgraded due to slower than expected progress and RLE proprioceptive/sensory deficits.

## 2020-06-12 NOTE — Progress Notes (Signed)
Occupational Therapy Session Note  Patient Details  Name: Katrina Randolph MRN: [id number] Date of Birth: 08-16-1930  Today's Date: 06/12/2020  OT Individual Time: 0822-0922 OT Individual Time Calculation (min): 60 min   OT Individual Time: 1130-1159 OT Individual Time Calculation (min): 29 min    Short Term Goals: Week 1:  OT Short Term Goal 1 (Week 1): STGs = LTGs  Skilled Therapeutic Interventions/Progress Updates:    Session 1: Pt received in room and consented to OT tx. Pt has no c/o pain this session. Pt seen for morning ADL routine including bathing, toileting, dressing, grooming, and functional transfers and mobility and functional balance. Pt able to bathe and dress with setup while seated, SUP for standing aspects of bathing and dressing. Pt completed oral care and grooming with setup while standing sink side. Pt instructed in item transport and retrieval in room to simulate home environment with RW and close SUP. Pt instructed in bean bag toss while standing at RW for increased dynamic standing balance. Once down in gym, pt instructed in standing clothespin activity with distant SUP to increase [facility] and intrinsic hand strength for ADLs. After tx, pt helped back to room and left up in chair with alarm on with all needs met.  Session 2: Pt received in room in chair and consented to OT tx, pt has no c/o pain. Pt taken outside in w/c and instructed in BUE strengthening HEP to increase strength and activity tolerance for ADLs and IADLs. Pt instructed in 2# unilateral db exercises including elbow flexion, chest press, and shoulder press for 3x10 with min cuing for proper technique with good carryover. Pt required frequent rest breaks due to fatigue. After tx, pt helped back to room and left up in chair with all needs met, chair alarm on.  Therapy Documentation Precautions:  Precautions Precautions: Fall Precaution Comments: Expressive > receptive aphasia Restrictions Weight Bearing  Restrictions: No   Pain: none     Therapy/Group: Individual Therapy  Perl Kerney 06/12/2020, 8:55 AM

## 2020-06-12 NOTE — Progress Notes (Signed)
Inpatient Rehabilitation Care Coordinator Discharge Note  The overall goal for the admission was met for:   Discharge location: Yes-HOME WITH DAUGHTER COMING TO [place].  Length of Stay: Yes-9 days  Discharge activity level: Yes-SUPERVISION-MOD/I LEVEL  Home/community participation: Yes  Services provided included: MD, RD, PT, OT, SLP, RN, CM, Pharmacy, Neuropsych and SW  Financial Services: Private Insurance: [place] offered to/list presented to:PT AND DAUGHTER  Follow-up services arranged: Outpatient: [facility]-WILL CALL DAUGHTER TO SET UP APPOINTMENTS, DME: [facility] and Patient/Family has no preference for HH/DME agencies  Comments (or additional information):KAREN AWARE PT WILL NEED 24/7 SUPERVISION-OTHER DAUGHTER COMING FROM [place] TO STAY WITH HER FOR A SHORT TIME.   Patient/Family verbalized understanding of follow-up arrangements: Yes  Individual responsible for coordination of the follow-up plan: [facility] [phone]  Confirmed correct DME delivered: Elease Hashimoto 06/12/2020    Marcedes Tech, Gardiner Rhyme

## 2020-06-12 NOTE — Discharge Summary (Addendum)
Physician Discharge Summary  Patient ID: Katrina Randolph MRN: [id number] DOB/AGE: October 28, 1930 87 y.o.  Admit date: 06/05/2020 Discharge date: 06/14/2020  Discharge Diagnoses:  Principal Problem:   Left middle cerebral artery stroke [facility]) Active Problems:   Expressive aphasia   Atrial fibrillation ([place])   Prediabetes   Dysesthesia Hypothyroidism Hyperlipidemia Obesity Tobacco use  Discharged Condition: Stable  Significant Diagnostic Studies: DG Chest 2 View  Result Date: 05/31/2020 CLINICAL DATA:  TIA EXAM: CHEST - 2 VIEW COMPARISON:  Radiograph 10/12/2018 FINDINGS: Diffuse pulmonary vascular congestion with hazy and streaky opacities in the mid to lower lungs and more patchy, coalescent opacity in the retrocardiac space. No pneumothorax or visible effusion. Biapical pleuroparenchymal scarring. Stable cardiomegaly with a calcified, tortuous aorta. The osseous structures appear diffusely demineralized which may limit detection of small or nondisplaced fractures. No acute osseous or soft tissue abnormality. Degenerative changes are present in the imaged spine and shoulders. Telemetry leads overlie the chest. IMPRESSION: Diffuse pulmonary vascular congestion with hazy and streaky opacities in the mid to lower lungs and more patchy, coalescent opacity in the retrocardiac space. Findings may reflect some developing edema and/or atelectasis. Infection less favored though not fully excluded in the appropriate clinical context. Electronically Signed   By: Lovena Le M.D.   On: 05/31/2020 00:11   CT Head Wo Contrast  Result Date: 05/30/2020 CLINICAL DATA:  Altered mental status, last known normal 12 hours ago EXAM: CT HEAD WITHOUT CONTRAST TECHNIQUE: Contiguous axial images were obtained from the base of the skull through the vertex without intravenous contrast. COMPARISON:  None. FINDINGS: Brain: No evidence of acute large vascular territory infarction, hemorrhage, hydrocephalus, extra-axial  collection or mass lesion/mass effect. Age related global parenchymal volume loss with ex vacuo dilatation of ventricular system and prominent extra-axial spaces. There are patchy subcortical and periventricular white matter hypodensities which are nonspecific but favored represent sequela of chronic ischemic small vessel white matter disease. Vascular: No hyperdense vessel. Atherosclerotic calcifications of the intracranial portions of the internal carotid and vertebral arteries. Skull: Hyperostosis frontalis. Negative for fracture or focal lesion. Sinuses/Orbits: The paranasal sinuses and mastoid air cells are predominantly clear. Pneumatized petrous apices. Orbits are grossly unremarkable. Other: None IMPRESSION: 1. No acute intracranial findings. 2. Age related global parenchymal volume loss and chronic ischemic small vessel white matter disease. Electronically Signed   By: Dahlia Bailiff MD   On: 05/30/2020 14:34   MR ANGIO HEAD WO CONTRAST  Result Date: 05/30/2020 CLINICAL DATA:  Neuro deficit, acute stroke suspected. EXAM: MRI HEAD WITHOUT CONTRAST MRA HEAD WITHOUT CONTRAST TECHNIQUE: Multiplanar, multiecho pulse sequences of the brain and surrounding structures were obtained without intravenous contrast. Angiographic images of the head were obtained using MRA technique without contrast. COMPARISON:  None. FINDINGS: MRI HEAD FINDINGS Brain: Multiple acute infarcts in the left MCA territory, including acute infarct of the posterior left insula, left periventricular frontal lobe and left frontoparietal cortex superiorly. There is T2/FLAIR hyperintense mild edema associated with these infarcts. No substantial mass effect. No midline shift. Basal cisterns are patent. No evidence of acute hemorrhage. No extra-axial fluid collections. Additional moderate T2/FLAIR hyperintensities within the white matter and pons, nonspecific but most likely related to chronic microvascular ischemic disease. Frontal predominant  cerebral volume loss with associated prominence of the overlying extra-axial spaces. Vascular: Major arterial flow voids are maintained at the skull base. Skull and upper cervical spine: Normal marrow signal. Sinuses/Orbits: Clear sinuses.  Unremarkable orbits. Other: No sizable mastoid effusions. MRA HEAD FINDINGS Anterior circulation:  Bilateral ICAs are patent through the carotid terminus. Bilateral M1 MCAs are patent. Left proximal M2 MCA inferior division is poorly visualized, but within an area of artifact. There is occlusion of two other proximal left M2 MCA branches in the left sylvian fissure (see series 24, images 41 and 45). Additionally, there is suspected occlusion of a more distal left M2/M3 MCA branch (series 24, image 30). Smaller left A1 ACA, likely congenital. Otherwise, patent ACAs. Possible small (approximately 2 mm) outpouching in the region of the right ICA terminus. Posterior circulation: Patent intradural vertebral arteries and basilar artery. Right fetal type PCA. Patent proximal posterior cerebral arteries with suspected mild stenosis of the right P2 PCA. IMPRESSION: 1. Left posterior MCA territory acute infarcts with occlusion of multiple left M2 and M2/M3 MCA branches, as described above. Associated edema without substantial mass effect. 2. Moderate chronic microvascular ischemic disease. 3. Frontal predominant cerebral volume loss. 4. Possible small (approximately 2 mm) aneurysm in the region of the right ICA terminus. A CTA could further characterize if clinically indicated Critical/urgent findings discussed with Dr. Rory Percy at 6:14 p.m via telephone. Electronically Signed   By: Margaretha Sheffield MD   On: 05/30/2020 18:43   MR BRAIN WO CONTRAST  Result Date: 05/30/2020 CLINICAL DATA:  Neuro deficit, acute stroke suspected. EXAM: MRI HEAD WITHOUT CONTRAST MRA HEAD WITHOUT CONTRAST TECHNIQUE: Multiplanar, multiecho pulse sequences of the brain and surrounding structures were obtained  without intravenous contrast. Angiographic images of the head were obtained using MRA technique without contrast. COMPARISON:  None. FINDINGS: MRI HEAD FINDINGS Brain: Multiple acute infarcts in the left MCA territory, including acute infarct of the posterior left insula, left periventricular frontal lobe and left frontoparietal cortex superiorly. There is T2/FLAIR hyperintense mild edema associated with these infarcts. No substantial mass effect. No midline shift. Basal cisterns are patent. No evidence of acute hemorrhage. No extra-axial fluid collections. Additional moderate T2/FLAIR hyperintensities within the white matter and pons, nonspecific but most likely related to chronic microvascular ischemic disease. Frontal predominant cerebral volume loss with associated prominence of the overlying extra-axial spaces. Vascular: Major arterial flow voids are maintained at the skull base. Skull and upper cervical spine: Normal marrow signal. Sinuses/Orbits: Clear sinuses.  Unremarkable orbits. Other: No sizable mastoid effusions. MRA HEAD FINDINGS Anterior circulation: Bilateral ICAs are patent through the carotid terminus. Bilateral M1 MCAs are patent. Left proximal M2 MCA inferior division is poorly visualized, but within an area of artifact. There is occlusion of two other proximal left M2 MCA branches in the left sylvian fissure (see series 24, images 41 and 45). Additionally, there is suspected occlusion of a more distal left M2/M3 MCA branch (series 24, image 30). Smaller left A1 ACA, likely congenital. Otherwise, patent ACAs. Possible small (approximately 2 mm) outpouching in the region of the right ICA terminus. Posterior circulation: Patent intradural vertebral arteries and basilar artery. Right fetal type PCA. Patent proximal posterior cerebral arteries with suspected mild stenosis of the right P2 PCA. IMPRESSION: 1. Left posterior MCA territory acute infarcts with occlusion of multiple left M2 and M2/M3 MCA  branches, as described above. Associated edema without substantial mass effect. 2. Moderate chronic microvascular ischemic disease. 3. Frontal predominant cerebral volume loss. 4. Possible small (approximately 2 mm) aneurysm in the region of the right ICA terminus. A CTA could further characterize if clinically indicated Critical/urgent findings discussed with Dr. Rory Percy at 6:14 p.m via telephone. Electronically Signed   By: Margaretha Sheffield MD   On: 05/30/2020 18:43  DG Swallowing Func-Speech Pathology  Result Date: 06/01/2020 Objective Swallowing Evaluation: Type of Study: MBS-Modified Barium Swallow Study  Patient Details Name: Katrina Randolph MRN: [id number] Date of Birth: Apr 07, 1930 Today's Date: 06/01/2020 Time: SLP Start Time (ACUTE ONLY): 1003 -SLP Stop Time (ACUTE ONLY): 1017 SLP Time Calculation (min) (ACUTE ONLY): 14 min Past Medical History: Past Medical History: Diagnosis Date  Atrial fibrillation ([place])  Past Surgical History: Past Surgical History: Procedure Laterality Date  ABDOMINAL HYSTERECTOMY    BIOPSY  10/19/2019  Procedure: BIOPSY;  Surgeon: [name], MD;  Location: [facility];  Service: Gastroenterology;;  BREAST LUMPECTOMY    CHOLECYSTECTOMY    COLONOSCOPY WITH PROPOFOL N/A 10/19/2019  Procedure: COLONOSCOPY WITH PROPOFOL;  Surgeon: [name], MD;  Location: [facility];  Service: Gastroenterology;  Laterality: N/A;  ESOPHAGOGASTRODUODENOSCOPY (EGD) WITH PROPOFOL N/A 10/19/2019  Procedure: ESOPHAGOGASTRODUODENOSCOPY (EGD) WITH PROPOFOL;  Surgeon: [name], MD;  Location: [facility];  Service: Gastroenterology;  Laterality: N/A;  POLYPECTOMY  10/19/2019  Procedure: POLYPECTOMY;  Surgeon: [name], MD;  Location: [facility];  Service: Gastroenterology;; HPI: Pt is an 87 y.o. female with a PMHx of permanent atrial fibrillation with RVT not on AC, HTN, HLD not on statin, orthostatic hypotension, symptomatic recurrent anemia, GERD, depression, breast  cancer, hypothyroidism,  who presented to [facility] ED with a chief complaint of inability to communicate effectively and decreased sensation on her right side. Daughter also noted some difficulty walking and right sided weakness. She was out of the time window for tPA at the time of arrival to the ED. MRI brain revealed left posterior MCA territory acute infarctions with occlusion of multiple left M2 and M2/M3 branches.  Chest CT (05/30/20) revealed "Diffuse pulmonary vascular congestion with hazy and streaky opacities in the mid to lower lungs and more patchy, coalescent opacity in the retrocardiac space. Findings may reflect some developing edema and/or atelectasis. Infection less favored though not fully excluded in the appropriate clinical context. Per NP note (05/31/20), "passed nursing bedside swallow but coughing during my visit today with liquids". BSE requested.  No data recorded Assessment / Plan / Recommendation CHL IP CLINICAL IMPRESSIONS 06/01/2020 Clinical Impression Pt presents with normal oropharyngeal swallow. With all solid and liquid pt demonstrated adequate oral manipulation, cohesion and timely swallow trigger with strong pharyngeal peristalsis and laryngeal closure which was evident with absence of penetration/aspiration and normal amounts of residuals in vallecula and pyriform sinuses. It should be noted, however, that pt exhibited initial difficulty obtaining anterior- posterior oral transit of whole pill with thin liquids and eventually lodged in vallecular space briefly before fully propelled into cervical esophagus with help of puree wash. Cough x1 noted after swallowing whole pill, but no evidence of airway compromise with fluoro turned on. Recommend pt continue on regular, thin liquid diet with meds whole (puree wash as needed). SLP to f/u for tolerance and education. SLP Visit Diagnosis Dysphagia, unspecified (R13.10) Attention and concentration deficit following -- Frontal lobe and  executive function deficit following -- Impact on safety and function No limitations   CHL IP TREATMENT RECOMMENDATION 06/01/2020 Treatment Recommendations Therapy as outlined in treatment plan below   Prognosis 06/01/2020 Prognosis for Safe Diet Advancement Good Barriers to Reach Goals Language deficits Barriers/Prognosis Comment -- CHL IP DIET RECOMMENDATION 06/01/2020 SLP Diet Recommendations Regular solids;Thin liquid Liquid Administration via Straw;Cup Medication Administration Whole meds with liquid Compensations Slow rate;Small sips/bites Postural Changes Seated upright at 90 degrees   CHL IP OTHER RECOMMENDATIONS 06/01/2020 Recommended Consults -- Oral Care Recommendations  Oral care BID Other Recommendations --   CHL IP FOLLOW UP RECOMMENDATIONS 06/01/2020 Follow up Recommendations Inpatient Rehab   CHL IP FREQUENCY AND DURATION 06/01/2020 Speech Therapy Frequency (ACUTE ONLY) min 2x/week Treatment Duration 2 weeks      CHL IP ORAL PHASE 06/01/2020 Oral Phase WFL Oral - Pudding Teaspoon -- Oral - Pudding Cup -- Oral - Honey Teaspoon -- Oral - Honey Cup -- Oral - Nectar Teaspoon -- Oral - Nectar Cup -- Oral - Nectar Straw -- Oral - Thin Teaspoon -- Oral - Thin Cup -- Oral - Thin Straw -- Oral - Puree -- Oral - Mech Soft -- Oral - Regular -- Oral - Multi-Consistency -- Oral - Pill -- Oral Phase - Comment --  CHL IP PHARYNGEAL PHASE 06/01/2020 Pharyngeal Phase WFL Pharyngeal- Pudding Teaspoon -- Pharyngeal -- Pharyngeal- Pudding Cup -- Pharyngeal -- Pharyngeal- Honey Teaspoon -- Pharyngeal -- Pharyngeal- Honey Cup -- Pharyngeal -- Pharyngeal- Nectar Teaspoon -- Pharyngeal -- Pharyngeal- Nectar Cup -- Pharyngeal -- Pharyngeal- Nectar Straw -- Pharyngeal -- Pharyngeal- Thin Teaspoon -- Pharyngeal -- Pharyngeal- Thin Cup -- Pharyngeal -- Pharyngeal- Thin Straw -- Pharyngeal -- Pharyngeal- Puree -- Pharyngeal -- Pharyngeal- Mechanical Soft -- Pharyngeal -- Pharyngeal- Regular -- Pharyngeal -- Pharyngeal- Multi-consistency  -- Pharyngeal -- Pharyngeal- Pill -- Pharyngeal -- Pharyngeal Comment --  CHL IP CERVICAL ESOPHAGEAL PHASE 06/01/2020 Cervical Esophageal Phase WFL Pudding Teaspoon -- Pudding Cup -- Honey Teaspoon -- Honey Cup -- Nectar Teaspoon -- Nectar Cup -- Nectar Straw -- Thin Teaspoon -- Thin Cup -- Thin Straw -- Puree -- Mechanical Soft -- Regular -- Multi-consistency -- Pill -- Cervical Esophageal Comment -- Ellwood Dense, MA, CCC-SLP Acute Rehabilitation Services Office Number: [phone] Acie Fredrickson 06/01/2020, 11:39 AM              ECHOCARDIOGRAM COMPLETE  Result Date: 05/31/2020    ECHOCARDIOGRAM REPORT   Patient Name:   Katrina Randolph Date of Exam: 05/31/2020 Medical Rec #:  [id number]             Height:       62.0 in Accession #:    [id number]            Weight:       169.3 lb Date of Birth:  11/19/1930             BSA:          1.781 m Patient Age:    48 years              BP:           129/77 mmHg Patient Gender: F                     HR:           103 bpm. Exam Location:  Inpatient Procedure: 2D Echo, Cardiac Doppler and Color Doppler Indications:    Stroke I63.9  History:        Patient has prior history of Echocardiogram examinations, most                 recent 10/13/2019. Arrythmias:Atrial Fibrillation.  Sonographer:    Tiffany Dance Referring Phys: [id number] [place]  1. Left ventricular ejection fraction, by estimation, is 55 to 60%. The left ventricle has normal function. The left ventricle has no regional wall motion abnormalities. Left ventricular diastolic function could not be evaluated.  2. Right ventricular systolic function is moderately reduced. The right  ventricular size is mildly enlarged. There is severely elevated pulmonary artery systolic pressure. The estimated right ventricular systolic pressure is 123XX123 mmHg.  3. Left atrial size was severely dilated.  4. Right atrial size was severely dilated.  5. The pericardial effusion is posterior to the left ventricle.   6. The mitral valve is normal in structure. Mild mitral valve regurgitation. No evidence of mitral stenosis.  7. Tricuspid valve regurgitation is mild to moderate.  8. The aortic valve is normal in structure. Aortic valve regurgitation is not visualized. No aortic stenosis is present.  9. The inferior vena cava is dilated in size with >50% respiratory variability, suggesting right atrial pressure of 8 mmHg. FINDINGS  Left Ventricle: Left ventricular ejection fraction, by estimation, is 55 to 60%. The left ventricle has normal function. The left ventricle has no regional wall motion abnormalities. The left ventricular internal cavity size was normal in size. There is  no left ventricular hypertrophy. Left ventricular diastolic function could not be evaluated due to atrial fibrillation. Left ventricular diastolic function could not be evaluated. Right Ventricle: The right ventricular size is mildly enlarged. No increase in right ventricular wall thickness. Right ventricular systolic function is moderately reduced. There is severely elevated pulmonary artery systolic pressure. The tricuspid regurgitant velocity is 3.69 m/s, and with an assumed right atrial pressure of 8 mmHg, the estimated right ventricular systolic pressure is 123XX123 mmHg. Left Atrium: Left atrial size was severely dilated. Right Atrium: Right atrial size was severely dilated. Pericardium: Trivial pericardial effusion is present. The pericardial effusion is posterior to the left ventricle. Mitral Valve: The mitral valve is normal in structure. Mild mitral valve regurgitation. No evidence of mitral valve stenosis. Tricuspid Valve: The tricuspid valve is normal in structure. Tricuspid valve regurgitation is mild to moderate. No evidence of tricuspid stenosis. Aortic Valve: The aortic valve is normal in structure. Aortic valve regurgitation is not visualized. No aortic stenosis is present. Pulmonic Valve: The pulmonic valve was normal in structure.  Pulmonic valve regurgitation is trivial. No evidence of pulmonic stenosis. Aorta: The aortic root is normal in size and structure. Venous: The inferior vena cava is dilated in size with greater than 50% respiratory variability, suggesting right atrial pressure of 8 mmHg. IAS/Shunts: The interatrial septum appears to be lipomatous. No atrial level shunt detected by color flow Doppler.  LEFT VENTRICLE PLAX 2D LVIDd:         4.20 cm LVIDs:         3.30 cm LV PW:         1.10 cm LV IVS:        1.10 cm LVOT diam:     1.80 cm LV SV:         33 LV SV Index:   18 LVOT Area:     2.54 cm  RIGHT VENTRICLE          IVC RV Basal diam:  3.60 cm  IVC diam: 2.60 cm RV Mid diam:    2.60 cm TAPSE (M-mode): 1.3 cm LEFT ATRIUM             Index       RIGHT ATRIUM           Index LA diam:        5.70 cm 3.20 cm/m  RA Area:     30.60 cm LA Vol (A2C):   93.1 ml 52.27 ml/m RA Volume:   110.00 ml 61.76 ml/m LA Vol (A4C):   81.0 ml  45.48 ml/m LA Biplane Vol: 87.7 ml 49.24 ml/m  AORTIC VALVE LVOT Vmax:   69.45 cm/s LVOT Vmean:  45.100 cm/s LVOT VTI:    0.129 m  AORTA Ao Root diam: 3.00 cm Ao Asc diam:  3.00 cm MITRAL VALVE                TRICUSPID VALVE MV Area (PHT): 3.27 cm     TR Peak grad:   54.5 mmHg MV Decel Time: 232 msec     TR Vmax:        369.00 cm/s MV E velocity: 112.00 cm/s MV A velocity: 41.20 cm/s   SHUNTS MV E/A ratio:  2.72         Systemic VTI:  0.13 m                             Systemic Diam: 1.80 cm Fransico Him MD Electronically signed by Fransico Him MD Signature Date/Time: 05/31/2020/3:43:30 PM    Final    VAS US CAROTID  Result Date: 05/31/2020 Carotid Arterial Duplex Study Indications: CVA. Performing Technologist: Abram Sander RVS  Examination Guidelines: A complete evaluation includes B-mode imaging, spectral Doppler, color Doppler, and power Doppler as needed of all accessible portions of each vessel. Bilateral testing is considered an integral part of a complete examination. Limited examinations for  reoccurring indications may be performed as noted.  Right Carotid Findings: +----------+--------+--------+--------+------------------+--------+           PSV cm/sEDV cm/sStenosisPlaque DescriptionComments +----------+--------+--------+--------+------------------+--------+ CCA Prox  41      9               heterogenous               +----------+--------+--------+--------+------------------+--------+ CCA Distal41      10              heterogenous               +----------+--------+--------+--------+------------------+--------+ ICA Prox  73      18      1-39%   heterogenous               +----------+--------+--------+--------+------------------+--------+ ICA Distal93      24                                         +----------+--------+--------+--------+------------------+--------+ ECA       90                                                 +----------+--------+--------+--------+------------------+--------+ +----------+--------+-------+--------+-------------------+           PSV cm/sEDV cmsDescribeArm Pressure (mmHG) +----------+--------+-------+--------+-------------------+ [id number]                                         +----------+--------+-------+--------+-------------------+ +---------+--------+--+--------+--+---------+ VertebralPSV cm/s66EDV cm/s10Antegrade +---------+--------+--+--------+--+---------+  Left Carotid Findings: +----------+--------+--------+--------+------------------+--------+           PSV cm/sEDV cm/sStenosisPlaque DescriptionComments +----------+--------+--------+--------+------------------+--------+ CCA Prox  48      7               heterogenous               +----------+--------+--------+--------+------------------+--------+  CCA Distal33      5               heterogenous               +----------+--------+--------+--------+------------------+--------+ ICA Prox  102     22      1-39%   heterogenous                +----------+--------+--------+--------+------------------+--------+ ICA Distal62      19                                         +----------+--------+--------+--------+------------------+--------+ ECA       71      8                                          +----------+--------+--------+--------+------------------+--------+ +----------+--------+--------+--------+-------------------+           PSV cm/sEDV cm/sDescribeArm Pressure (mmHG) +----------+--------+--------+--------+-------------------+ [id number]                                          +----------+--------+--------+--------+-------------------+ +---------+--------+--+--------+--+---------+ VertebralPSV cm/s39EDV cm/s13Antegrade +---------+--------+--+--------+--+---------+   Summary: Right Carotid: Velocities in the right ICA are consistent with a 1-39% stenosis. Left Carotid: Velocities in the left ICA are consistent with a 1-39% stenosis. Vertebrals: Bilateral vertebral arteries demonstrate antegrade flow. *See table(s) above for measurements and observations.  Electronically signed by Jamelle Haring on 05/31/2020 at 4:30:40 PM.    Final     Labs:  Basic Metabolic Panel: Recent Labs  Lab 06/13/20 0532  NA 141  K 4.2  CL 107  CO2 26  GLUCOSE 117*  BUN 15  CREATININE 0.86  CALCIUM 9.0    CBC: No results for input(s): WBC, NEUTROABS, HGB, HCT, MCV, PLT in the last 168 hours.  CBG: No results for input(s): GLUCAP in the last 168 hours.  Family history.  Positive for hypertension hyperlipidemia.  Denies any colon cancer esophageal cancer or rectal cancer  Brief HPI:   Katrina Randolph is a 87 y.o. right-handed female with history of atrial fibrillation not on anticoagulation as patient did see [name] in the past for possible watchman's consideration which patient declined, hypothyroidism, tobacco abuse.  Per chart review lives alone 1 level home one-step to entry.  Independent  with assistive device.  She is independent with homemaking ADLs and still drives.  Presented 05/30/2020 with aphasia and decrease in balance as well as decreased sensation on the right side of acute onset.  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  Moderate chronic microvascular ischemic disease.  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  Patient did not receive tPA.  Admission chemistries unremarkable except potassium 3.4 glucose 118.  Echocardiogram with ejection fraction of 55 to 60% no wall motion abnormalities.  Carotid Dopplers with no ICA stenosis.  Patient initially on aspirin for CVA prophylaxis changed to Eliquis in light of history of atrial fibrillation.  Patient initially was to be admitted to inpatient rehab services 06/03/2020 but held due to developing RVR monitored no changes except  the addition of Toprol.  No chest pain or shortness of breath.  Due to patient's decreased functional mobility and aphasia was admitted for a comprehensive rehab program.   Hospital Course: Katrina Randolph was admitted to rehab 06/05/2020 for inpatient therapies to consist of PT, ST and OT at least three hours five days a week. Past admission physiatrist, therapy team and rehab RN have worked together to provide customized collaborative inpatient rehab.  Pertaining to patient's left MCA territory infarction remained stable maintained on Eliquis no bleeding episodes patient would follow-up neurology services.  Right side neuropathic pain maintained on Neurontin with good results.  Mood stabilization with Zoloft emotional support provided she was attending full therapies.  Atrial fibrillation cardiac rate controlled she remained on Toprol 25 mg daily as well as Eliquis.  Hypothyroidism Synthroid as recommended.  Lipitor for hyperlipidemia.  Morbid obesity BMI 30.97 dietary follow-up.   Prediabetes with hemoglobin A1c 6.2 currently on a regular consistency diet she would need outpatient follow-up.   Blood pressures were monitored on TID basis and controlled     Rehab course: During patient's stay in rehab weekly team conferences were held to monitor patient's progress, set goals and discuss barriers to discharge. At admission, patient required min mod assist 50 feet rolling walker minimal assist sit to stand.  Set up upper body bathing mod assist lower body bathing minimal assist lower body dressing mod assist lower body dressing  Physical exam.  Blood pressure 152/75 pulse 80 respirations 18 oxygen saturations 92% room air Constitutional.  No acute distress HEENT Head.  Normocephalic and atraumatic Eyes.  Pupils round and reactive to light no discharge.nystagmus Neck.  Supple nontender no JVD without thyromegaly Cardiac regular rate rhythm without extra sounds or murmur heard Abdomen.  Soft nontender positive bowel sounds without rebound Respiratory effort normal no respiratory distress without wheeze Musculoskeletal.  Normal range of motion Skin.  Warm and dry Neurologic.  Alert oriented with extra time reasonable insight and awareness.  Word finding deficits, verbal and motor apraxia.  Mild right facial weakness.  Right upper extremity 4/5.  Left upper extremity 4+/5.  Right lower extremity 4+/5.  Left lower extremity 5/5.  Slight decrease sensation light touch right upper right lower extremity  He/She  has had improvement in activity tolerance, balance, postural control as well as ability to compensate for deficits. He/She has had improvement in functional use RUE/LUE  and RLE/LLE as well as improvement in awareness.  Ambulates on level surfaces 250 feet multiple turns contact-guard assist.  Gait training up and down curb step similar to patient's step from garage into the house per daughter contact-guard assist.  Sit to stand rolling walker contact-guard assist.  STS  with rolling walker with close, ambulate at room level rolling walker contact-guard assist.  Toilet transfers with walk-in shower transfer contact-guard assist.  Completes lower body clothing management seated.  Care close supervision.  Able to don and doff shirt underwear pants close supervision.  SLP facilitated complex problem solving and air awareness skills and familiar checkbook writing tasks required minimal assist for verbal cues.  Full family teaching completed plan discharge to home       Disposition: Discharged to home    Diet: Regular  Special Instructions: No driving smoking or alcohol  Medications at discharge. 1.  Tylenol as needed 2.  Eliquis 5 mg p.o. twice daily 3.  Lipitor 20 mg p.o. daily 4.  Neurontin 100 mg p.o. 3 times daily 5.  Synthroid 112 mcg p.o.  daily 6.  Toprol-XL 25 mg p.o. daily 7.  Protonix 40 mg p.o. daily 8.  Zoloft 50 mg p.o. daily 9.  Lasix 20 mg daily as needed for fluid   30-35 minutes were spent completing discharge summary and discharge planning  Discharge Instructions     Ambulatory referral to Neurology   Complete by: As directed    An appointment is requested in approximately 4 weeks left MCA infarction   Ambulatory referral to Physical Medicine Rehab   Complete by: As directed    Moderate complexity follow-up 1 to 2 weeks left MCA infarction        Follow-up Information     Jamse Arn, MD Follow up.   Specialty: Physical Medicine and Rehabilitation Why: Office to call for appointment Contact information: [address] [place] [address] [phone]         [name], MD Follow up.   Specialty: Cardiology Why: Call for appointment Contact information: [address] [place] [address] [phone]                 Signed: Cathlyn Parsons 06/14/2020, 5:46 AM Patient was seen, face-face, and physical exam performed by me on day of discharge, greater than 30  minutes of total time spent.. Please see progress note from day of discharge as well.  Delice Lesch, MD, ABPMR

## 2020-06-12 NOTE — Progress Notes (Signed)
Speech Language Pathology Daily Session Note  Patient Details  Name: Katrina Randolph MRN: [id number] Date of Birth: 03-27-1930  Today's Date: 06/12/2020 SLP Individual Time: 4496-7591 SLP Individual Time Calculation (min): 45 min  Short Term Goals: Week 1: SLP Short Term Goal 1 (Week 1): STG=LTG due to short ELOS  Skilled Therapeutic Interventions:   Skilled SLP intervention focused on cognition. Pt completed calendar organization task with mod A for reasoning. She demonstrated difficulty with re-reading information she wrote to answer simple problem solving questions. Educated pt on ensuring she has enough space and writes legibly when keeping track of appointments. Medication management task completed with pill organizer with min A for error awareness. She demonstrated confusion with BID pill organizer and needed max instruction to understand amount of pills that should be placed in each compartment based on prescription. Recommend continued tasks using pill organizer and medication management. Cont with therapy per plan of care.  Pain Pain Assessment Pain Scale: Faces Pain Score: 5  Faces Pain Scale: No hurt Pain Location: Head Pain Descriptors / Indicators: Aching  Therapy/Group: Individual Therapy  Gregary Signs A Peggy Monk 06/12/2020, 11:10 AM

## 2020-06-13 LAB — BASIC METABOLIC PANEL
Anion gap: 8 (ref 5–15)
BUN: 15 mg/dL (ref 8–23)
CO2: 26 mmol/L (ref 22–32)
Calcium: 9 mg/dL (ref 8.9–10.3)
Chloride: 107 mmol/L (ref 98–111)
Creatinine, Ser: 0.86 mg/dL (ref 0.44–1.00)
GFR, Estimated: >60 mL/min (ref >60)
Glucose, Bld: 117 mg/dL — ABNORMAL HIGH (ref 70–99)
Potassium: 4.2 mmol/L (ref 3.5–5.1)
Sodium: 141 mmol/L (ref 135–145)

## 2020-06-13 MED ORDER — ESOMEPRAZOLE MAGNESIUM 20 MG PO CPDR: 20.0000 mg | DELAYED_RELEASE_CAPSULE | Freq: Every day | ORAL | 0 refills | Status: DC

## 2020-06-13 MED ORDER — APIXABAN 5 MG PO TABS: 5.0000 mg | ORAL_TABLET | Freq: Two times a day (BID) | ORAL | 0 refills | Status: DC

## 2020-06-13 MED ORDER — GABAPENTIN 100 MG PO CAPS: 100.0000 mg | ORAL_CAPSULE | Freq: Three times a day (TID) | ORAL | 0 refills | Status: DC

## 2020-06-13 MED ORDER — ACETAMINOPHEN 325 MG PO TABS: 650.0000 mg | ORAL_TABLET | ORAL | Status: AC | PRN

## 2020-06-13 MED ORDER — METOPROLOL SUCCINATE ER 25 MG PO TB24: 25.0000 mg | ORAL_TABLET | Freq: Every day | ORAL | 0 refills | Status: DC

## 2020-06-13 MED ORDER — SERTRALINE HCL 50 MG PO TABS: 50.0000 mg | ORAL_TABLET | Freq: Every day | ORAL | 0 refills | Status: AC

## 2020-06-13 MED ORDER — LEVOTHYROXINE SODIUM 112 MCG PO TABS: 112.0000 ug | ORAL_TABLET | Freq: Every morning | ORAL | 0 refills | Status: AC

## 2020-06-13 MED ORDER — ATORVASTATIN CALCIUM 10 MG PO TABS: 20.0000 mg | ORAL_TABLET | Freq: Every day | ORAL | 0 refills | Status: DC

## 2020-06-13 NOTE — Progress Notes (Signed)
Occupational Therapy Discharge Summary  Patient Details  Name: Katrina Randolph MRN: [id number] Date of Birth: [birth year]     Patient has met 90 of 11 long term goals due to improved activity tolerance, improved balance, postural control, ability to compensate for deficits, functional use of  RIGHT upper and RIGHT lower extremity and improved coordination.  Patient to discharge at overall Supervision level.  Patient's care partner is independent to provide the necessary physical and cognitive assistance at discharge.    Reasons goals not met: n/a  Recommendation:  Patient will benefit from ongoing skilled OT services in outpatient setting to continue to advance functional skills in the area of BADL and iADL.  Equipment: No equipment provided - pt has a flip down shower seat but dtr plans to buy another small seat for her  Reasons for discharge: treatment goals met  Patient/family agrees with progress made and goals achieved: Yes  OT Discharge Precautions/Restrictions  Precautions Precautions: Fall Precaution Comments: Expressive aphasia Restrictions Weight Bearing Restrictions: No  ADL Eating: Independent Grooming: Setup Where Assessed-Grooming: Standing at sink Upper Body Bathing: Supervision/safety Where Assessed-Upper Body Bathing: Shower Lower Body Bathing: Supervision/safety Where Assessed-Lower Body Bathing: Shower Upper Body Dressing: Setup Where Assessed-Upper Body Dressing: Chair Lower Body Dressing: Supervision/safety Where Assessed-Lower Body Dressing: Chair Toileting: Independent Where Assessed-Toileting: Glass blower/designer: Close supervision Armed forces technical officer Method: Counselling psychologist: Energy manager: Close supervision Social research officer, government Method: Heritage manager: Grab bars Vision Baseline Vision/History: No visual deficits;Wears glasses Wears Glasses: Reading only Patient Visual  Report: No change from baseline Vision Assessment?: No apparent visual deficits Perception  Perception: Within Functional Limits Praxis Praxis: Intact Cognition Overall Cognitive Status: Impaired/Different from baseline Arousal/Alertness: Awake/alert Orientation Level: Oriented X4 Attention: Sustained;Selective Sustained Attention: Appears intact Selective Attention: Appears intact Memory: Impaired Memory Impairment: Decreased recall of new information Awareness: Impaired Awareness Impairment: Emergent impairment (verbal errors) Problem Solving: Impaired Problem Solving Impairment: Verbal complex;Functional complex Safety/Judgment: Appears intact Sensation Sensation Light Touch: Impaired by gross assessment Hot/Cold: Appears Intact Proprioception: Impaired by gross assessment Stereognosis: Impaired by gross assessment Additional Comments: Impacted by R hemibody sensory deficits Coordination Gross Motor Movements are Fluid and Coordinated: No Fine Motor Movements are Fluid and Coordinated: Yes Coordination and Movement Description: Needs cues for increased awareness for R foot placement during dynamic movements and gait Finger Nose Finger Test: WFL - no dysmetria noted Heel Shin Test: [facility] 9 Hole Peg Test: 30 seconds on R hand, and 32 seconds on L - functional response time Motor  Motor Motor: Hemiplegia Motor - Discharge Observations: Mild R hemi. Sensory > muscular impairments Mobility    supervision with ambulation Trunk/Postural Assessment  Postural Control Postural Control: Within Functional Limits  Balance Balance Balance Assessed: Yes Standardized Balance Assessment Standardized Balance Assessment: Berg Balance Test Berg Balance Test Sit to Stand: Able to stand without using hands and stabilize independently Standing Unsupported: Able to stand safely 2 minutes Sitting with Back Unsupported but Feet Supported on Floor or Stool: Able to sit safely and securely 2  minutes Stand to Sit: Sits safely with minimal use of hands Transfers: Able to transfer safely, minor use of hands Standing Unsupported with Eyes Closed: Able to stand 10 seconds safely Standing Ubsupported with Feet Together: Able to place feet together independently and stand for 1 minute with supervision From Standing, Reach Forward with Outstretched Arm: Can reach forward >5 cm safely (2") From Standing Position, Pick up Object from Floor: Able to pick up  shoe, needs supervision From Standing Position, Turn to Look Behind Over each Shoulder: Needs supervision when turning Turn 360 Degrees: Needs close supervision or verbal cueing Standing Unsupported, Alternately Place Feet on Step/Stool: Able to complete >2 steps/needs minimal assist Standing Unsupported, One Foot in Front: Loses balance while stepping or standing Standing on One Leg: Unable to try or needs assist to prevent fall Total Score: 35 Dynamic standing: supervision Extremity/Trunk Assessment RUE Assessment RUE Assessment: Within Functional Limits General Strength Comments: 30 lbs grasp strength LUE Assessment LUE Assessment: Within Functional Limits General Strength Comments: 20 lbs of grasp strength   Keylin Podolsky 06/13/2020, 12:59 PM

## 2020-06-13 NOTE — Progress Notes (Signed)
Speech Language Pathology Discharge Summary  Patient Details  Name: Katrina Randolph MRN: [id number] Date of Birth: 05/28/1930  Today's Date: 06/13/2020 SLP Individual Time: 0845-0929 SLP Individual Time Calculation (min): 44 min   Skilled Therapeutic Interventions:  Skilled ST services focused on education and language skills. SLP facilitated oral reading and reading comprehension skills, pt required min A fade to supervision A verbal cues at the paragraph level. Daughter entered and was present for the rest of education. SLP provided education on current high level expressive and receptive aphasia. Need for assistance with higher level cognitive task such as money/medication/time management and how to cue for word finding in conversation as well as correcting errors. Pt was able to complete divergent naming of animals in 1 minute interval, named 9 and with min A semantic cues for subcategories increased to 12 (n=>15.) All questions answered to satisfaction. Pt was left in room with call bell within reach and chair alarm set. SLP recommends to continue skilled services. Pt benefited from skilled ST services in order to maximize functional independence and reduce burden of care, requiring 24 hour supervision at discharge with continued skilled ST services.     Patient has met 2 of 2 long term goals.  Patient to discharge at overall Min;Supervision level.  Reasons goals not met:     Clinical Impression/Discharge Summary:   Pt made great progress meeting 2 out 2 goals, discharging at min A for expressive and receptive aphasia . Due to language impairment higher level cognitive task as also impacted in short term recall and mild-complex problem solving skills. Pt demonstrated increase in functional and verbal error awareness requiring min A verbal cues.    Care Partner:  Caregiver Able to Provide Assistance: Yes  Type of Caregiver Assistance: Physical  Recommendation:  Home Health  SLP;Outpatient SLP;24 hour supervision/assistance  Rationale for SLP Follow Up: Maximize functional communication;Reduce caregiver burden;Maximize cognitive function and independence   Equipment: N/A   Reasons for discharge: Discharged from hospital   Patient/Family Agrees with Progress Made and Goals Achieved: Yes       06/13/2020, 11:54 AM    

## 2020-06-13 NOTE — Progress Notes (Signed)
Occupational Therapy Session Note  Patient Details  Name: Katrina Randolph MRN: [id number] Date of Birth: June 04, 1930  Today's Date: 06/13/2020 OT Individual Time: 1000-1100 OT Individual Time Calculation (min): 60 min    Short Term Goals: Week 1:  OT Short Term Goal 1 (Week 1): STGs = LTGs  Skilled Therapeutic Interventions/Progress Updates:      Pt seen for BADL retraining of toileting, bathing, and dressing with a focus on family education with pt and her daughter.  Pt continues to need some cues with problem solving, sequencing, safety awareness for safest methods for getting in and out of shower, sitting to doff/don pants over feet vs standing.  Her daughter observed and understands that the recommendation is to have Supervision.  Discussed general home safety.  Pt often states her R arm does not work and used 9 hole peg test, MMT and dynometer to show her that her arm is actually functional but with her nerve pain her arm feels "tingly" and "heavy".  Discussed the rehab process.  Pt participated well with rest breaks as needed.  ADL Eating: Independent Grooming: Setup Where Assessed-Grooming: Standing at sink Upper Body Bathing: Supervision/safety Where Assessed-Upper Body Bathing: Shower Lower Body Bathing: Supervision/safety Where Assessed-Lower Body Bathing: Shower Upper Body Dressing: Setup Where Assessed-Upper Body Dressing: Chair Lower Body Dressing: Supervision/safety Where Assessed-Lower Body Dressing: Chair Toileting: Independent Where Assessed-Toileting: Glass blower/designer: Close supervision Toilet Transfer Method: Counselling psychologist: Energy manager: Close supervision Social research officer, government Method: Heritage manager: Grab bars   Therapy Documentation Precautions:  Precautions Precautions: Fall Precaution Comments: Expressive aphasia Restrictions Weight Bearing Restrictions: No  Pain: Pain  Assessment Pain Scale: 0-10 Pain Score:  (c/o "nerve" pain in R shoulder, nursing aware) Pain Location: Head Pain Descriptors / Indicators: Headache Pain Frequency: Intermittent    Therapy/Group: Individual Therapy  Daltin Crist 06/13/2020, 12:50 PM

## 2020-06-13 NOTE — Progress Notes (Signed)
Occupational Therapy Session Note  Patient Details  Name: Katrina Randolph MRN: [id number] Date of Birth: [birth year]  Today's Date: 06/13/2020 OT Individual Time: 2671-2458 OT Individual Time Calculation (min): 45 min    Short Term Goals: Week 1:  OT Short Term Goal 1 (Week 1): STGs = LTGs  Skilled Therapeutic Interventions/Progress Updates:    Pt received in room and consented to OT tx. Pt reports she is feeling tired today. Session focused on standing tolerance, home-environment mobility, item retrieval and transport, and laundry tasks to simulate IADLs in the home. Session focused on dynamic and static standing balance for light meal prep and other IADLs. Pt able to pick up items from floor level with cuing for safety with RW. Pt instructed in BUE strengthening HEP to increase strength for ADLs including 2# db elbow flexion, shoulder press, chest press, and shoulder flexion for 2x10 with min cuing for proper technique with good carryover. After tx, pt left in recliner with alarm on with all needs met.   Therapy Documentation Precautions:  Precautions Precautions: Fall Precaution Comments: Expressive aphasia Restrictions Weight Bearing Restrictions: No    Vital Signs: Therapy Vitals Temp: 97.8 F (36.6 C) Temp Source: Oral Pulse Rate: 86 Resp: 17 BP: 111/88 Patient Position (if appropriate): Sitting Oxygen Therapy SpO2: 97 % O2 Device: Room Air Pain: Pain Assessment Pain Scale: 0-10 Pain Score: 0-No pain   Therapy/Group: Individual Therapy  Latwan Luchsinger 06/13/2020, 3:35 PM

## 2020-06-13 NOTE — Discharge Summary (Signed)
Physical Therapy Discharge Summary  Patient Details  Name: Katrina Randolph MRN: [id number] Date of Birth: 07-Sep-1930  Today's Date: 06/13/2020 PT Individual Time: 1100-1156 PT Individual Time Calculation (min): 56 min    Patient has met 7 of 8 long term goals due to improved activity tolerance, improved balance, improved postural control, increased strength, ability to compensate for deficits, functional use of  right upper extremity and right lower extremity, improved attention and improved awareness.  Patient to discharge at an ambulatory level Supervision.   Patient's care partner is independent to provide the necessary physical and cognitive assistance at discharge.  Reasons goals not met: Pt unable to achieve 12 steps supervision goal - she was able to go up/down 8 steps with CGA and 2 hand rails . Goals were downgraded to supervision due to balance deficits and R hemibody sensory/proprioception impairments.  Recommendation:  Patient will benefit from ongoing skilled PT services in outpatient setting to continue to advance safe functional mobility, address ongoing impairments in static and dynamic balance, R hemibody neuro re-ed, safety training in order to minimize fall risk.  Equipment: RW  Reasons for discharge: treatment goals met and discharge from hospital  Patient/family agrees with progress made and goals achieved: Yes  PT Discharge Precautions/Restrictions Precautions Precautions: Fall Precaution Comments: Expressive aphasia Restrictions Weight Bearing Restrictions: No Vision/Perception  Perception Perception: Within Functional Limits Praxis Praxis: Intact  Cognition Overall Cognitive Status: Impaired/Different from baseline Arousal/Alertness: Awake/alert Orientation Level: Oriented X4 Problem Solving: Impaired Problem Solving Impairment: Verbal complex;Functional complex Safety/Judgment: Appears intact Sensation Sensation Light Touch: Impaired by gross  assessment Hot/Cold: Appears Intact Proprioception: Impaired by gross assessment Stereognosis: Impaired by gross assessment Additional Comments: Impacted by R hemibody sensory deficits Coordination Gross Motor Movements are Fluid and Coordinated: No Fine Motor Movements are Fluid and Coordinated: No Coordination and Movement Description: Needs cues for increased awareness for R foot placement during dynamic movements and gait Heel Shin Test: [facility] Motor  Motor Motor: Hemiplegia Motor - Discharge Observations: Mild R hemi. Sensory > muscular impairments  Mobility Bed Mobility Bed Mobility: Sit to Supine;Supine to Sit Supine to Sit: Independent Sit to Supine: Independent Transfers Transfers: Sit to Stand;Stand to Sit;Stand Pivot Transfers Sit to Stand: Independent with assistive device Stand to Sit: Independent with assistive device Stand Pivot Transfers: Supervision/Verbal cueing Stand Pivot Transfer Details: Verbal cues for precautions/safety;Verbal cues for technique;Visual cues/gestures for sequencing;Verbal cues for sequencing Transfer (Assistive device): None Locomotion  Gait Ambulation: Yes Gait Assistance: Supervision/Verbal cueing Gait Distance (Feet): 150 Feet Assistive device: 4-wheeled walker;Rolling walker Gait Assistance Details: Verbal cues for technique;Verbal cues for sequencing;Verbal cues for gait pattern;Verbal cues for safe use of DME/AE;Verbal cues for precautions/safety Gait Assistance Details: Cues for awareness of R foot placement, widening BOS, and increasing R heel strike Gait Gait: Yes Gait Pattern: Impaired Gait Pattern: Step-through pattern;Decreased step length - right;Decreased stance time - right;Decreased weight shift to right;Poor foot clearance - right;Narrow base of support Gait velocity: decr Stairs / Additional Locomotion Stairs: Yes Stairs Assistance: Contact Guard/Touching assist Stair Management Technique: Two rails Number of Stairs:  8 Height of Stairs: 6 Ramp: Contact Guard/touching assist Wheelchair Mobility Wheelchair Mobility: No  Trunk/Postural Assessment  Cervical Assessment Cervical Assessment: Within Functional Limits Thoracic Assessment Thoracic Assessment: Exceptions to [facility] (Rounded shoulders) Lumbar Assessment Lumbar Assessment: Exceptions to [facility] (Posterior pelvic tilt) Postural Control Postural Control: Within Functional Limits  Balance Balance Balance Assessed: Yes Standardized Balance Assessment Standardized Balance Assessment: Berg Balance Test Berg Balance Test Sit to Stand:  Able to stand without using hands and stabilize independently Standing Unsupported: Able to stand safely 2 minutes Sitting with Back Unsupported but Feet Supported on Floor or Stool: Able to sit safely and securely 2 minutes Stand to Sit: Sits safely with minimal use of hands Transfers: Able to transfer safely, minor use of hands Standing Unsupported with Eyes Closed: Able to stand 10 seconds safely Standing Ubsupported with Feet Together: Able to place feet together independently and stand for 1 minute with supervision From Standing, Reach Forward with Outstretched Arm: Can reach forward >5 cm safely (2") From Standing Position, Pick up Object from Floor: Able to pick up shoe, needs supervision From Standing Position, Turn to Look Behind Over each Shoulder: Needs supervision when turning Turn 360 Degrees: Needs close supervision or verbal cueing Standing Unsupported, Alternately Place Feet on Step/Stool: Able to complete >2 steps/needs minimal assist Standing Unsupported, One Foot in Front: Loses balance while stepping or standing Standing on One Leg: Unable to try or needs assist to prevent fall Total Score: 35 Static Sitting Balance Static Sitting - Balance Support: Feet supported;No upper extremity supported Static Sitting - Level of Assistance: 7: Independent Dynamic Sitting Balance Dynamic Sitting - Balance  Support: Feet supported;No upper extremity supported Dynamic Sitting - Level of Assistance: 7: Independent Static Standing Balance Static Standing - Balance Support: No upper extremity supported Static Standing - Level of Assistance: 5: Stand by assistance Dynamic Standing Balance Dynamic Standing - Balance Support: During functional activity Dynamic Standing - Level of Assistance: 5: Stand by assistance Extremity Assessment  RLE Assessment RLE Assessment: Exceptions to [facility] General Strength Comments: Grossly 4+/5 LLE Assessment LLE Assessment: Within Functional Limits General Strength Comments: Grossly 5/5  Skilled Intervention: Pt greeted supine in bed, agreeable to therapy. No reports of pain. Daughter at bedside for family ed. Bed mobility completed indep without difficulty. Sit's EOB indep without LOB. Lengthy discussion with patient and daughter regarding DC planning, home safety training, role of f/u therapies, fall prevention strategies, removal of throw rugs and mats in home, clear straight paths in home without clutter, etc. Daughter reports she has purchased a device similar to that of a "Life Alert" that patient will wear 24/7. Pt performed sit<>stand mod I to rollator and she ambulated ~227f with close supervision and rollator to main rehab gym. During ambulation, educated daughter on guarding technique and to be watchful for R toe drag/catch and narrow BOS with R lateral lean. Pt with x1 instance of R toe catch with a self corrected LOB, daughter able to observe and have better understanding. Pt then ambulated additional ~2030fto ortho therapy gym with supervision and rollator. She completed car transfer with supervision and rollator with car height set to simulate that of their HR-V. She completed this twice to ensure carryover of technique as she initially tried to transfer via lateral stepping into the car, but with cues she was able to perform the safe method of sitting first and  then swinging legs in/out. Pt then ambulated up/down ~1054famp with CGA and her rollator - cues for improving R foot awareness and safety. She then completed the BERG as outlined above in Balance section.   Patient demonstrates increased fall risk as noted by score of 35/56 on Berg Balance Scale.  (<36= high risk for falls, close to 100%; 37-45 significant >80%; 46-51 moderate >50%; 52-55 lower >25%). (pt scored 27/56 on BERG on date of eval).  Results of BERG relayed to daughter and patient and reinforced that she  is still in the "high falls risk" category and falls risk to be addressed in f/u therapies. Also reinforced importance of 24/7 supervision at DC due to falls risk.  Pt then ambulated back to her room with supervision and rollator, >372f - similar cues as above. She ended session seated in recliner with chair alarm on, daughter at bedside, needs within reach.    Kmari Brian P Chia Mowers PT 06/13/2020, 7:58 AM

## 2020-06-13 NOTE — Progress Notes (Signed)
[place] PHYSICAL MEDICINE & REHABILITATION PROGRESS NOTE  Subjective/Complaints: Patient seen laying in bed this AM.  She states she slept well overnight.  She has questions regarding discharge follow up appointment.   ROS: Denies CP, SOB, N/V/D.  Objective: Vital Signs: Blood pressure 139/66, pulse 76, temperature (!) 97.5 F (36.4 C), temperature source Oral, resp. rate 17, height 5\' 2"  (1.575 m), weight 74.7 kg, SpO2 95 %. No results found. No results for input(s): WBC, HGB, HCT, PLT in the last 72 hours. Recent Labs    06/13/20 0532  NA 141  K 4.2  CL 107  CO2 26  GLUCOSE 117*  BUN 15  CREATININE 0.86  CALCIUM 9.0    Intake/Output Summary (Last 24 hours) at 06/13/2020 1033 Last data filed at 06/13/2020 0854 Gross per 24 hour  Intake 840 ml  Output --  Net 840 ml        Physical Exam: BP 139/66 (BP Location: Right Arm)   Pulse 76   Temp (!) 97.5 F (36.4 C) (Oral)   Resp 17   Ht 5\' 2"  (1.575 m)   Wt 74.7 kg   SpO2 95%   BMI 30.12 kg/m   Constitutional: No distress . Vital signs reviewed. HENT: Normocephalic.  Atraumatic. Eyes: EOMI. No discharge. Cardiovascular: No JVD.   Respiratory: Normal effort.  No stridor.   GI: Non-distended.   Skin: Warm and dry.  Scattered ecchymosis. Psych: Normal mood.  Normal behavior. Musc: No edema in extremities.  No tenderness in extremities. Neuro: Alert and oriented Mild expressive aphasia, stable Motor: RUE/RLE: 4+-5/5 proximal distal, unchanged LUE/LE: 5/5 proximal distal Sensation diminished to light touch right side  Assessment/Plan: 1. Functional deficits which require 3+ hours per day of interdisciplinary therapy in a comprehensive inpatient rehab setting.  Physiatrist is providing close team supervision and 24 hour management of active medical problems listed below.  Physiatrist and rehab team continue to assess barriers to discharge/monitor patient progress toward functional and medical goals   Care  Tool:  Bathing    Body parts bathed by patient: Right arm,Left arm,Chest,Abdomen,Front perineal area,Right upper leg,Buttocks,Left upper leg,Right lower leg,Left lower leg,Face         Bathing assist Assist Level: Supervision/Verbal cueing     Upper Body Dressing/Undressing Upper body dressing   What is the patient wearing?: Pull over shirt    Upper body assist Assist Level: Set up assist Assistive Device Comment: seaed  Lower Body Dressing/Undressing Lower body dressing      What is the patient wearing?: Underwear/pull up,Pants     Lower body assist Assist for lower body dressing: Supervision/Verbal cueing     Toileting Toileting    Toileting assist Assist for toileting: Supervision/Verbal cueing     Transfers Chair/bed transfer  Transfers assist     Chair/bed transfer assist level: Contact Guard/Touching assist     Locomotion Ambulation   Ambulation assist      Assist level: Contact Guard/Touching assist Assistive device: Walker-rolling Max distance: 250   Walk 10 feet activity   Assist     Assist level: Contact Guard/Touching assist Assistive device: Walker-rolling   Walk 50 feet activity   Assist    Assist level: Contact Guard/Touching assist Assistive device: Walker-rolling    Walk 150 feet activity   Assist Walk 150 feet activity did not occur: Safety/medical concerns (Elevated HR)  Assist level: Contact Guard/Touching assist Assistive device: Walker-rolling    Walk 10 feet on uneven surface  activity   Assist Walk  10 feet on uneven surfaces activity did not occur:  (Ramp)   Assist level: Minimal Assistance - Patient > 75% Assistive device: Other (comment) (No devie)   Wheelchair     Assist Will patient use wheelchair at discharge?: No   Wheelchair activity did not occur: N/A         Wheelchair 50 feet with 2 turns activity    Assist    Wheelchair 50 feet with 2 turns activity did not occur:  N/A       Wheelchair 150 feet activity     Assist  Wheelchair 150 feet activity did not occur: N/A        Medical Problem List and Plan: 1.  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  Impaired mobility -DVT/anticoagulation: Continue Eliquis             -antiplatelet therapy: N/A 3. Right sided neuropathic pain: Tylenol as needed  Increased gabapentin to 100mg  TID   Relatively controlled on 5/3, patient does not want to make any further changes present. 4. Mood: Zoloft 50 mg daily             -antipsychotic agents: N/A 5. Neuropsych: This patient is capable of making decisions on her own behalf. 6. Skin/Wound Care: Routine skin checks 7. Fluids/Electrolytes/Nutrition: Routine in and outs 8.  Atrial fibrillation.  Cardiac rate controlled.  Continue Toprol 25 mg daily.  Continue Eliquis  Heart rate controlled on 5/3             Monitor with increased activity 9.  Hypothyroidism: Continue Synthroid 10. Hyperlipidemia: Lipitor 11. Obesity: BMI 30.97: provide counseling  12. Anemia: Resolved  13. AKI: Resolved   Continue to encourage fluids 14.  Prediabetes  Elevated on 5/3  Continue to monitor in ambulatory setting with increase in meds if necessary.   Monitor with increased mobility 15. Low total protein: counsel regarding high protein foods  LOS: 8 days A FACE TO FACE EVALUATION WAS PERFORMED  Davionte Lusby Lorie Phenix 06/13/2020, 10:33 AM

## 2020-06-13 NOTE — Plan of Care (Signed)
  Problem: Consults Goal: RH STROKE PATIENT EDUCATION Description: See Patient Education module for education specifics  Outcome: Progressing   Problem: RH BOWEL ELIMINATION Goal: RH STG MANAGE BOWEL WITH ASSISTANCE Description: STG Manage Bowel with mod I Assistance. Outcome: Progressing Goal: RH STG MANAGE BOWEL W/MEDICATION W/ASSISTANCE Description: STG Manage Bowel with Medication with mod I Assistance. Outcome: Progressing   Problem: RH BLADDER ELIMINATION Goal: RH STG MANAGE BLADDER WITH ASSISTANCE Description: STG Manage Bladder With mod I Assistance Outcome: Progressing   Problem: RH SAFETY Goal: RH STG ADHERE TO SAFETY PRECAUTIONS W/ASSISTANCE/DEVICE Description: STG Adhere to Safety Precautions With cues/reminders Assistance/Device. Outcome: Progressing   Problem: RH PAIN MANAGEMENT Goal: RH STG PAIN MANAGED AT OR BELOW PT'S PAIN GOAL Description: At or below level 4 Outcome: Progressing   Problem: RH KNOWLEDGE DEFICIT Goal: RH STG INCREASE KNOWLEDGE OF DIABETES Description: Patient will be able to manage DM with medications and dietary modifications independently Outcome: Progressing Goal: RH STG INCREASE KNOWLEDGE OF HYPERTENSION Description: Patient will be able to manage HTN with medications and dietary modifications independently Outcome: Progressing Goal: RH STG INCREASE KNOWLEGDE OF HYPERLIPIDEMIA Description: Patient will be able to manage HLD with medications and dietary modifications independently Outcome: Progressing Goal: RH STG INCREASE KNOWLEDGE OF STROKE PROPHYLAXIS Description: Patient will be able to manage secondary stroke risks with medications and dietary modifications independently Outcome: Progressing   Problem: Consults Goal: RH STROKE PATIENT EDUCATION Description: See Patient Education module for education specifics  Outcome: Progressing

## 2020-06-13 NOTE — Progress Notes (Signed)
Patient ID: Katrina Randolph, female   DOB: November 03, 1930, 87 y.o.   MRN: [id number] Follow up with the patient regarding pending discharge. Reviewed prediabetes and handouts given for carb counting. Alos reviewed low protein level and recommendations for foods high in protein. Patient noted she had most of the items listed in her refrigerator at home and routinely ate high protein content foods including peanut/nut butter, whole milk yoghurt, cottage cheese, etc. Patient noted an understanding of the information reviewed. Also reported she is "ready to go home" however is concerned that her speech is not clearing. Denied decline or worsening, just persistence of the issue. Otherwise feels like she has made gains and is ready for and comfortable with the discharge. Margarito Liner, RN

## 2020-06-14 DIAGNOSIS — M792 Neuralgia and neuritis, unspecified: Secondary | ICD-10-CM

## 2020-06-14 NOTE — Progress Notes (Signed)
[place] PHYSICAL MEDICINE & REHABILITATION PROGRESS NOTE  Subjective/Complaints: Patient seen laying in bed.  She states she slept well overnight.  She states she is ready for discharge.  She has questions regarding discharge medications.  ROS: Denies CP, SOB, N/V/D.  Objective: Vital Signs: Blood pressure 130/73, pulse 72, temperature 97.9 F (36.6 C), resp. rate 17, height 5\' 2"  (1.575 m), weight 74.7 kg, SpO2 97 %. No results found. No results for input(s): WBC, HGB, HCT, PLT in the last 72 hours. Recent Labs    06/13/20 0532  NA 141  K 4.2  CL 107  CO2 26  GLUCOSE 117*  BUN 15  CREATININE 0.86  CALCIUM 9.0    Intake/Output Summary (Last 24 hours) at 06/14/2020 1052 Last data filed at 06/14/2020 0800 Gross per 24 hour  Intake 600 ml  Output --  Net 600 ml        Physical Exam: BP 130/73 (BP Location: Right Arm)   Pulse 72   Temp 97.9 F (36.6 C)   Resp 17   Ht 5\' 2"  (1.575 m)   Wt 74.7 kg   SpO2 97%   BMI 30.12 kg/m   Constitutional: No distress . Vital signs reviewed. HENT: Normocephalic.  Atraumatic. Eyes: EOMI. No discharge. Cardiovascular: No JVD.  RRR. Respiratory: Normal effort.  No stridor.  Bilateral clear to auscultation. GI: Non-distended.  BS +. Skin: Warm and dry.  Scattered ecchymosis. Psych: Normal mood.  Normal behavior. Musc: No edema in extremities.  No tenderness in extremities. Neuro: Alert Mild expressive aphasia, unchanged Motor: RUE/RLE: 4+-5/5 proximal distal, unchanged LUE/LE: 5/5 proximal distal Sensation diminished to light touch right side  Assessment/Plan: 1. Functional deficits which require 3+ hours per day of interdisciplinary therapy in a comprehensive inpatient rehab setting.  Physiatrist is providing close team supervision and 24 hour management of active medical problems listed below.  Physiatrist and rehab team continue to assess barriers to discharge/monitor patient progress toward functional and medical  goals   Care Tool:  Bathing    Body parts bathed by patient: Right arm,Left arm,Chest,Abdomen,Front perineal area,Right upper leg,Buttocks,Left upper leg,Right lower leg,Left lower leg,Face         Bathing assist Assist Level: Supervision/Verbal cueing     Upper Body Dressing/Undressing Upper body dressing   What is the patient wearing?: Pull over shirt    Upper body assist Assist Level: Set up assist Assistive Device Comment: seaed  Lower Body Dressing/Undressing Lower body dressing      What is the patient wearing?: Underwear/pull up,Pants     Lower body assist Assist for lower body dressing: Supervision/Verbal cueing     Toileting Toileting    Toileting assist Assist for toileting: Independent     Transfers Chair/bed transfer  Transfers assist     Chair/bed transfer assist level: Supervision/Verbal cueing     Locomotion Ambulation   Ambulation assist      Assist level: Supervision/Verbal cueing Assistive device: Walker-rolling Max distance: 362ft   Walk 10 feet activity   Assist     Assist level: Supervision/Verbal cueing Assistive device: Walker-rolling   Walk 50 feet activity   Assist    Assist level: Supervision/Verbal cueing Assistive device: Walker-rolling    Walk 150 feet activity   Assist Walk 150 feet activity did not occur: Safety/medical concerns (Elevated HR)  Assist level: Supervision/Verbal cueing Assistive device: Walker-rolling    Walk 10 feet on uneven surface  activity   Assist Walk 10 feet on uneven surfaces activity did  not occur:  (Ramp)   Assist level: Contact Guard/Touching assist Assistive device: Aeronautical engineer Will patient use wheelchair at discharge?: No   Wheelchair activity did not occur: N/A         Wheelchair 50 feet with 2 turns activity    Assist    Wheelchair 50 feet with 2 turns activity did not occur: N/A       Wheelchair 150 feet  activity     Assist  Wheelchair 150 feet activity did not occur: N/A        Medical Problem List and Plan: 1.  Aphasia as well as gait abnormality secondary to left MCA territory infarction  DC today  Will see patient for hospital follow-up in 1 month post-discharge 2.  Impaired mobility -DVT/anticoagulation: Continue Eliquis             -antiplatelet therapy: N/A 3. Right sided neuropathic pain: Tylenol as needed  Increased gabapentin to 100mg  TID   Relatively controlled on 5/4, patient does not want to make any further changes present. 4. Mood: Zoloft 50 mg daily             -antipsychotic agents: N/A 5. Neuropsych: This patient is capable of making decisions on her own behalf. 6. Skin/Wound Care: Routine skin checks 7. Fluids/Electrolytes/Nutrition: Routine in and outs 8.  Atrial fibrillation.  Cardiac rate controlled.  Continue Toprol 25 mg daily.  Continue Eliquis  Heart rate controlled on 5/4             Monitor with increased activity 9.  Hypothyroidism: Continue Synthroid 10. Hyperlipidemia: Lipitor 11. Obesity: BMI 30.97: provide counseling  12. Anemia: Resolved  13. AKI: Resolved   Continue to encourage fluids 14.  Prediabetes  Elevated on 5/3  Continue to monitor in ambulatory setting with increase in meds if necessary.   Monitor with increased mobility 15. Low total protein: counsel regarding high protein foods  > 30 minutes spent in total in discharge planning between myself and PA regarding aforementioned, as well discussion regarding DME equipment, follow-up appointments, follow-up therapies, discharge medications, discharge recommendations, answering questions.  Please see discharge summary as well.  LOS: 9 days A FACE TO FACE EVALUATION WAS PERFORMED  Cattaleya Wien Lorie Phenix 06/14/2020, 10:52 AM

## 2020-06-14 NOTE — Progress Notes (Signed)
INPATIENT REHABILITATION DISCHARGE NOTE   Discharge instructions by: Linna Hoff, PA  Verbalized understanding: yes  Skin care/Wound care: none  Pain: none  IV's: none  Tubes/Drains: none  Safety instructions: done  Patient belongings: sent with pt  Discharged to: home  Discharged via: Family transporting  Notes: done   [employer] Katrina Randolph

## 2020-06-19 ENCOUNTER — Encounter: Payer: Self-pay | Admitting: Speech Pathology

## 2020-06-19 ENCOUNTER — Other Ambulatory Visit: Payer: Self-pay

## 2020-06-19 ENCOUNTER — Ambulatory Visit: Payer: Medicare Other | Attending: Physical Medicine & Rehabilitation

## 2020-06-19 ENCOUNTER — Ambulatory Visit: Payer: Medicare Other | Admitting: Speech Pathology

## 2020-06-19 DIAGNOSIS — R4701 Aphasia: Secondary | ICD-10-CM

## 2020-06-19 DIAGNOSIS — R262 Difficulty in walking, not elsewhere classified: Secondary | ICD-10-CM | POA: Diagnosis present

## 2020-06-19 DIAGNOSIS — R2681 Unsteadiness on feet: Secondary | ICD-10-CM | POA: Insufficient documentation

## 2020-06-19 DIAGNOSIS — M6281 Muscle weakness (generalized): Secondary | ICD-10-CM | POA: Diagnosis present

## 2020-06-19 DIAGNOSIS — R208 Other disturbances of skin sensation: Secondary | ICD-10-CM | POA: Insufficient documentation

## 2020-06-19 DIAGNOSIS — I63512 Cerebral infarction due to unspecified occlusion or stenosis of left middle cerebral artery: Secondary | ICD-10-CM | POA: Insufficient documentation

## 2020-06-19 DIAGNOSIS — R252 Cramp and spasm: Secondary | ICD-10-CM | POA: Insufficient documentation

## 2020-06-19 NOTE — Therapy (Signed)
[place]. [place], [place], [address] Phone: [phone]   Fax:  [phone]  Physical Therapy Evaluation  Patient Details  Name: Katrina Randolph MRN: [id number] Date of Birth: [birth year] Referring Provider (PT): Posey Pronto   Encounter Date: 06/19/2020   PT End of Session - 06/19/20 1108    Visit Number 1    Number of Visits 17    Date for PT Re-Evaluation 08/21/20    Authorization Type U[employer] MCR- medical necessity    Progress Note Due on Visit 10    PT Start Time 6433    PT Stop Time 1100    PT Time Calculation (min) 45 min    Equipment Utilized During Treatment Gait belt    Activity Tolerance Patient tolerated treatment well    Behavior During Therapy WFL for tasks assessed/performed           Past Medical History:  Diagnosis Date  . Atrial fibrillation [facility])     Past Surgical History:  Procedure Laterality Date  . ABDOMINAL HYSTERECTOMY    . BIOPSY  10/19/2019   Procedure: BIOPSY;  Surgeon: [name], MD;  [facility];  Service: Gastroenterology;;  . BREAST LUMPECTOMY    . CHOLECYSTECTOMY    . COLONOSCOPY WITH PROPOFOL N/A 10/19/2019   Procedure: COLONOSCOPY WITH PROPOFOL;  Surgeon: [name], MD;  [facility];  Service: Gastroenterology;  Laterality: N/A;  . ESOPHAGOGASTRODUODENOSCOPY (EGD) WITH PROPOFOL N/A 10/19/2019   Procedure: ESOPHAGOGASTRODUODENOSCOPY (EGD) WITH PROPOFOL;  Surgeon: [name], MD;  [facility];  Service: Gastroenterology;  Laterality: N/A;  . POLYPECTOMY  10/19/2019   Procedure: POLYPECTOMY;  Surgeon: [name], MD;  [facility];  Service: Gastroenterology;;    There were no vitals filed for this visit.    Subjective Assessment - 06/19/20 1011    Subjective 87 y.o. female admitted to ED with AMS, decreased balance, and speech difficulties on 05/30/2020.  MRI of brain showed Lt posterior MCA territory acute  infarcts with occlusion of M2/M3, moderate microvascular ischemic disease. COmpleted some IPR P/OT/ST and Was discharged to home 06/14/2020 with FWW. Using forward wheeled walker at home and outside. denies any falls.    Patient is accompained by: Family member   Daughter Alcie Runions   Pertinent History A fib, lumpectomy at least 10 years ago, thyroid    Patient Stated Goals to be able to improve speech, to walk without walker    Currently in Pain? Yes    Pain Score --   some random places along right side, electric shots   Pain Type Acute pain              [facility] PT Assessment - 06/19/20 1012      Assessment   Medical Diagnosis L CVA    Referring Provider (PT) Posey Pronto    Onset Date/Surgical Date 05/30/20    Hand Dominance Right    Next MD Visit has one scheduled    Prior Therapy IP rehab      Home Environment   Additional Comments townhouse. 1 story. all one level small step garage into laudry room      Prior Function   Vocation Retired    [employer], grocery shopping      Cognition   Overall Cognitive Status Within Functional Limits for tasks assessed      Observation/Other Assessments   Focus on Therapeutic Outcomes (FOTO)  to be assessed      ROM /  Strength   AROM / PROM / Strength AROM;Strength      Strength   Strength Assessment Site Hip;Knee;Ankle    Right/Left Hip Right;Left    Right Hip Flexion 3+/5    Left Hip Flexion 4-/5    Right/Left Knee Right;Left    Right Knee Flexion 4+/5    Right Knee Extension 4+/5    Left Knee Flexion 3+/5    Left Knee Extension 3+/5   knee discomfort     Transfers   Five time sit to stand comments  19 seconds with BUE support. 23 sec without UE support      Standardized Balance Assessment   Standardized Balance Assessment Berg Balance Test;Timed Up and Go Test      Timed Up and Go Test   Normal TUG (seconds) 22   with FWW and without walker with CGA and giat belt                     Objective  measurements completed on examination: See above findings.       [facility] Adult PT Treatment/Exercise - 06/19/20 0001      Berg Balance Test   Sit to Stand Able to stand without using hands and stabilize independently    Standing Unsupported Able to stand safely 2 minutes    Sitting with Back Unsupported but Feet Supported on Floor or Stool Able to sit safely and securely 2 minutes    Stand to Sit Sits safely with minimal use of hands    Transfers Able to transfer safely, minor use of hands    Standing Unsupported with Eyes Closed Able to stand 10 seconds safely    Standing Ubsupported with Feet Together Able to place feet together independently and stand for 1 minute with supervision    From Standing, Reach Forward with Outstretched Arm Can reach forward >5 cm safely (2")    From Standing Position, Pick up Object from Floor Able to pick up shoe, needs supervision    From Standing Position, Turn to Look Behind Over each Shoulder Turn sideways only but maintains balance    Turn 360 Degrees Needs close supervision or verbal cueing    Standing Unsupported, Alternately Place Feet on Step/Stool Able to complete >2 steps/needs minimal assist    Standing Unsupported, One Foot in Front Loses balance while stepping or standing    Standing on One Leg Unable to try or needs assist to prevent fall    Total Score 36                  PT Education - 06/19/20 1106    Education Details Initial PT POC and HEP. Access Code: [email or web address] emphasis seated strength exercises in addition to STS with support. Pt lives alone and demo'd signifcant posterior LOB with trial of standing march instead of sitting requiring min A to regain stanidng balance - not safe for independent standing strength/balance activities at home without supervision at this time.    Person(s) Educated Patient;Child(ren)    Methods Explanation;Demonstration;Handout    Comprehension Verbalized understanding;Returned demonstration;Need  further instruction            PT Short Term Goals - 06/19/20 1116      PT SHORT TERM GOAL #1   Title Independent with initial HEP    Time 2    Period Weeks    Status New    Target Date 07/03/20  PT Long Term Goals - 06/19/20 1116      PT LONG TERM GOAL #1   Title Independent with advanced HEP    Time 8    Period Weeks    Status New    Target Date 08/21/20      PT LONG TERM GOAL #2   Title Pt will be able to complete 5TSTS and TUG independent and without LOB in </= 12 seconds to demonstrate improved functional strength, gait speed, and balance.    Baseline 06/19/2020: 5TSTS 23 seconds without UE support, TUG 22 seconds without walker and with CGA/gait belt    Time 8    Period Weeks    Status New    Target Date 08/21/20      PT LONG TERM GOAL #3   Title Pt will score at least 46/56 on the berg balance scale to demonstrate decreased falls risk    Baseline 06/19/20: 36/56    Time 8    Period Weeks    Status New    Target Date 08/21/20      PT LONG TERM GOAL #4   Title BLE strength at least 4+/5    Time 8    Period Weeks    Status New    Target Date 08/21/20      PT LONG TERM GOAL #5   Title Pt will be able to ambulate indoor and outdoor uneven surfaces and negotiate obstacles with LRAD and no LOB to facilitate safety within home and community environments.    Baseline depending on FWW, decreases speed around turns    Time 8    Period Weeks    Status New    Target Date 08/21/20                  Plan - 06/19/20 1046    Clinical Impression Statement Pt is an 87 yo female s/p L MCA CVA sustained on 05/30/2020. PMH significant for AFib. Pt currently presents with general deconditioning, decreased and asymmetrical mms strength (right side weaker than left), abnormal gait and diminished functional balance (especially with turns and narrow base of support, or without visual input). As per scores on the Berg, TUG and 5TSTS pt has diminished functional  balance and presents as a falls risk at this time. Pt would benefit from skilled physical therapy at increased frequency of 2x/wk to maximinze functional strength and balance gains, improve independence with functional mobility, and decrease falls risk.    Stability/Clinical Decision Making Evolving/Moderate complexity    Clinical Decision Making Moderate    PT Frequency 2x / week    PT Duration 8 weeks    PT Treatment/Interventions ADLs/Self Care Home Management;Cryotherapy;Electrical Stimulation;Moist Heat;Gait training;Neuromuscular re-education;Therapeutic exercise;Therapeutic activities;Functional mobility training;Patient/family education;Manual techniques;Energy conservation;Taping;Vestibular    PT Next Visit Plan Stroke FOTO. Reassess HEP. cardio endurance, strengthening, balance progression as tolerated. CGA for standing balance/strength exercises with BUE supported for safety initially.    PT Home Exercise Plan Access Code: [email or web address] emphasis seated strength exercises (yellow TB) in addition to STS with support. Pt lives alone and demo'd signifcant posterior LOB with trial of standing march instead of sitting requiring min A to regain standing balance - not safe for independent standing strength/balance activities at home without supervision at this time.    Consulted and Agree with Plan of Care Patient;Family member/caregiver    Family Member Consulted Daughter Jelitza Ohle           Patient will benefit from skilled therapeutic intervention in  order to improve the following deficits and impairments:  Abnormal gait,Decreased endurance,Pain,Decreased balance,Decreased mobility,Decreased strength,Impaired sensation  Visit Diagnosis: Left middle cerebral artery stroke ([place]) - Plan: PT plan of care cert/re-cert  Unsteadiness on feet - Plan: PT plan of care cert/re-cert  Muscle weakness (generalized) - Plan: PT plan of care cert/re-cert  Cramp and spasm - Plan: PT plan of care  cert/re-cert  Other disturbances of skin sensation - Plan: PT plan of care cert/re-cert     Problem List Patient Active Problem List   Diagnosis Date Noted  . Neuropathic pain   . Dysesthesia   . Prediabetes   . Expressive aphasia   . Atrial fibrillation ([place])   . 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 CVA (cerebral vascular accident) ([place]) 05/30/2020  . Orthostatic hypotension 01/26/2020  . Essential hypertension 01/26/2020  . Acute respiratory failure ([place]) 10/19/2019  . Colon polyp 10/19/2019  . Orthostatic dizziness 10/19/2019  . Hypothyroidism 10/19/2019  . Rapid atrial fibrillation ([place]) 10/12/2019  . Pneumonia 10/12/2019  . Anemia requiring transfusions 10/12/2019  . DNR (do not resuscitate) 10/12/2019  . Acquired thrombophilia ([place]) 10/12/2019  . Iron deficiency anemia 11/24/2017  . Paroxysmal atrial fibrillation ([place]) 11/24/2017  . Chronic depression 11/24/2017  . GERD (gastroesophageal reflux disease) 11/24/2017  . Breast cancer ([place]) 11/24/2017  . Hypothyroid 11/24/2017  . Dyslipidemia 11/24/2017    Hall Busing, PT, DPT 06/19/2020, 11:24 AM  [place]. [place], [place], [address] Phone: [phone]   Fax:  [phone]  Name: Katrina Randolph MRN: [id number] Date of Birth: 21-Mar-1930

## 2020-06-19 NOTE — Therapy (Signed)
[place]. [place], [place], [address] Phone: [phone]   Fax:  [phone]  Speech Language Pathology Evaluation  Patient Details  Name: Katrina Randolph MRN: [id number] Date of Birth: December 20, 1930 Referring Provider (SLP): Jamse Arn MD   Encounter Date: 06/19/2020   End of Session - 06/19/20 1106    Visit Number 1    Number of Visits 17    Date for SLP Re-Evaluation 08/19/20    SLP Start Time 1100    SLP Stop Time  1145    SLP Time Calculation (min) 45 min    Activity Tolerance Patient tolerated treatment well           Past Medical History:  Diagnosis Date  . Atrial fibrillation [facility])     Past Surgical History:  Procedure Laterality Date  . ABDOMINAL HYSTERECTOMY    . BIOPSY  10/19/2019   Procedure: BIOPSY;  Surgeon: [name], MD;  [facility];  Service: Gastroenterology;;  . BREAST LUMPECTOMY    . CHOLECYSTECTOMY    . COLONOSCOPY WITH PROPOFOL N/A 10/19/2019   Procedure: COLONOSCOPY WITH PROPOFOL;  Surgeon: [name], MD;  [facility];  Service: Gastroenterology;  Laterality: N/A;  . ESOPHAGOGASTRODUODENOSCOPY (EGD) WITH PROPOFOL N/A 10/19/2019   Procedure: ESOPHAGOGASTRODUODENOSCOPY (EGD) WITH PROPOFOL;  Surgeon: [name], MD;  [facility];  Service: Gastroenterology;  Laterality: N/A;  . POLYPECTOMY  10/19/2019   Procedure: POLYPECTOMY;  Surgeon: [name], MD;  [facility];  Service: Gastroenterology;;    There were no vitals filed for this visit.   Subjective Assessment - 06/19/20 1104    Subjective Pt reports she lives alone and would prefer to stay independent.    Currently in Pain? No/denies              SLP Evaluation [facility] - 06/19/20 1128      SLP Visit Information   SLP Received On 06/19/20    Referring Provider (SLP) Jamse Arn MD    Onset Date 05/30/20    Medical Diagnosis LCVA       Subjective   Patient/Family Stated Goal To be able to get the words out      General Information   HPI Hx: Pt is a 87 yo female who presented to the ED on 05/30/20 w/ word finding and mild confusion. She was living independently. Found to have acute L posterior MCA CVA. Other hx sig for a-fib and hypothyroidism.      Balance Screen   Has the patient fallen in the past 6 months No      Prior Functional Status   Cognitive/Linguistic Baseline Within functional limits    Type of Home House     Lives With Alone    Available Support Family    Vocation Retired      Doctor, general practice   Overall Auditory Comprehension Impaired    Yes/No Questions Within Functional Limits    Complex Questions 75-100% accurate    Paragraph Comprehension (via yes/no questions) 76-100% accurate    Commands Impaired    One Step Basic Commands 75-100% accurate    Two Step Basic Commands 75-100% accurate    Interfering Components Processing speed    EffectiveTechniques Extra processing time;Repetition      Verbal Expression   Overall Verbal Expression Impaired    Initiation No impairment    Naming Impairment    Confrontation 25-49% accurate    Verbal Errors Phonemic paraphasias;Not aware  of errors;Inconsistent      Standardized Assessments   Standardized Assessments  [facility] Naming Test-2nd edition    [place] Naming Test-2nd edition  Short Form - 6/15                           SLP Education - 06/19/20 1134    Education Details Aphasia    Person(s) Educated Patient    Methods Explanation    Comprehension Verbalized understanding;Returned demonstration;Need further instruction            SLP Short Term Goals - 06/19/20 1124      SLP SHORT TERM GOAL #1   Title Pt will recall 3 word finding strategies to assist with finding words.    Time 4    Period Weeks    Status New      SLP SHORT TERM GOAL #2   Title Pt will produce single word without phonemic paraphasias given a single  model.    Time 4    Period Weeks    Status New            SLP Long Term Goals - 06/19/20 1118      SLP LONG TERM GOAL #1   Title Pt will produce <2 phonemic errors in a structured sentence.    Time 8    Period Weeks    Status New      SLP LONG TERM GOAL #2   Title Pt will name 4 semantic features of a given object.    Time 8    Period Weeks    Status New            Plan - 06/19/20 1136    Clinical Impression Statement Pt is a 87 yo female who presents with expressive and receptive language deficits. Pt exhibited significant phonemic paraphasias in single words. Was receptive to verbal models and cueing. SLP gave BNT-2 e - short form and patient scored a 5/16.  Required extra time for processing. Endorses trouble with writing. Further assessment warranted of auditory comprehension. To assess next session. SLP rec skilled speech services to address expressive language deficit to increase pt's ability to verbalize needs and wants and increase patient's confidence.    Speech Therapy Frequency 2x / week    Duration 8 weeks    Treatment/Interventions Cueing hierarchy;Functional tasks;Patient/family education;Compensatory strategies;Environmental controls;Multimodal communcation approach;Language facilitation;Compensatory techniques;Cognitive reorganization;SLP instruction and feedback;Internal/external aids    Potential to Achieve Goals Good    Consulted and Agree with Plan of Care Patient           Patient will benefit from skilled therapeutic intervention in order to improve the following deficits and impairments:   Aphasia    Problem List Patient Active Problem List   Diagnosis Date Noted  . Neuropathic pain   . Dysesthesia   . Prediabetes   . Expressive aphasia   . Atrial fibrillation ([place])   . 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 CVA (cerebral vascular accident) ([place]) 05/30/2020  . Orthostatic hypotension 01/26/2020  . Essential hypertension  01/26/2020  . Acute respiratory failure ([place]) 10/19/2019  . Colon polyp 10/19/2019  . Orthostatic dizziness 10/19/2019  . Hypothyroidism 10/19/2019  . Rapid atrial fibrillation ([place]) 10/12/2019  . Pneumonia 10/12/2019  . Anemia requiring transfusions 10/12/2019  . DNR (do not resuscitate) 10/12/2019  . Acquired thrombophilia ([place]) 10/12/2019  . Iron deficiency anemia 11/24/2017  . Paroxysmal atrial fibrillation ([place]) 11/24/2017  . Chronic  depression 11/24/2017  . GERD (gastroesophageal reflux disease) 11/24/2017  . Breast cancer ([place]) 11/24/2017  . Hypothyroid 11/24/2017  . Dyslipidemia 11/24/2017    Verdene Lennert MS, [place], CBIS  06/19/2020, 11:43 AM  [place]. [place], [place], [address] Phone: [phone]   Fax:  [phone]  Name: Jaden Batchelder MRN: [id number] Date of Birth: Mar 31, 1930

## 2020-06-19 NOTE — Patient Instructions (Signed)
Access Code: [email or web address] URL: [email or web address] Date: 06/19/2020 Prepared by: Sherlynn Stalls  Exercises Sit to Stand with Counter Support - 1 x daily - 7 x weekly - 3 sets - 10 reps Seated March with Resistance - 1 x daily - 7 x weekly - 3 sets - 10 reps Seated Long Arc Quad - 1 x daily - 7 x weekly - 3 sets - 10 reps Seated inner thigh squeeze with ball or pillow - 1 x daily - 7 x weekly - 3 sets - 10 reps Seated Hip Abduction with Resistance - 1 x daily - 7 x weekly - 3 sets - 10 reps

## 2020-06-30 ENCOUNTER — Encounter: Payer: Self-pay | Admitting: Physical Therapy

## 2020-06-30 ENCOUNTER — Ambulatory Visit: Payer: Medicare Other | Admitting: Speech Pathology

## 2020-06-30 ENCOUNTER — Ambulatory Visit: Payer: Medicare Other | Admitting: Physical Therapy

## 2020-06-30 ENCOUNTER — Other Ambulatory Visit: Payer: Self-pay

## 2020-06-30 ENCOUNTER — Encounter: Payer: Self-pay | Admitting: Speech Pathology

## 2020-06-30 DIAGNOSIS — M6281 Muscle weakness (generalized): Secondary | ICD-10-CM

## 2020-06-30 DIAGNOSIS — R2681 Unsteadiness on feet: Secondary | ICD-10-CM

## 2020-06-30 DIAGNOSIS — R4701 Aphasia: Secondary | ICD-10-CM

## 2020-06-30 DIAGNOSIS — R262 Difficulty in walking, not elsewhere classified: Secondary | ICD-10-CM

## 2020-06-30 DIAGNOSIS — I63512 Cerebral infarction due to unspecified occlusion or stenosis of left middle cerebral artery: Secondary | ICD-10-CM | POA: Diagnosis not present

## 2020-06-30 NOTE — Therapy (Signed)
[place]. [place], [place], [address] Phone: [phone]   Fax:  [phone]  Speech Language Pathology Treatment  Patient Details  Name: Katrina Randolph MRN: [id number] Date of Birth: 01-Mar-1930 Referring Provider (SLP): Jamse Arn MD   Encounter Date: 06/30/2020   End of Session - 06/30/20 1149    Visit Number 2    Number of Visits 17    Date for SLP Re-Evaluation 08/19/20    SLP Start Time 1100    SLP Stop Time  1149    SLP Time Calculation (min) 49 min    Activity Tolerance Patient tolerated treatment well           Past Medical History:  Diagnosis Date  . Atrial fibrillation [facility])     Past Surgical History:  Procedure Laterality Date  . ABDOMINAL HYSTERECTOMY    . BIOPSY  10/19/2019   Procedure: BIOPSY;  Surgeon: [name], MD;  [facility];  Service: Gastroenterology;;  . BREAST LUMPECTOMY    . CHOLECYSTECTOMY    . COLONOSCOPY WITH PROPOFOL N/A 10/19/2019   Procedure: COLONOSCOPY WITH PROPOFOL;  Surgeon: [name], MD;  [facility];  Service: Gastroenterology;  Laterality: N/A;  . ESOPHAGOGASTRODUODENOSCOPY (EGD) WITH PROPOFOL N/A 10/19/2019   Procedure: ESOPHAGOGASTRODUODENOSCOPY (EGD) WITH PROPOFOL;  Surgeon: [name], MD;  [facility];  Service: Gastroenterology;  Laterality: N/A;  . POLYPECTOMY  10/19/2019   Procedure: POLYPECTOMY;  Surgeon: [name], MD;  [facility];  Service: Gastroenterology;;    There were no vitals filed for this visit.   Subjective Assessment - 06/30/20 1059    Subjective Pt reports she is doing well.    Currently in Pain? No/denies                 ADULT SLP TREATMENT - 06/30/20 1138      General Information   Behavior/Cognition Alert;Pleasant mood      Treatment Provided   Treatment provided Cognitive-Linquistic      Cognitive-Linquistic Treatment   Treatment  focused on Aphasia    Skilled Treatment SLP trained pt in sound-to-letter correspondance - pt had difficulty grasping concept. Educatedpatient on aphasia (expressive vs receptive). Benefited from visuals.      Assessment / Recommendations / Plan   Plan Continue with current plan of care      Progression Toward Goals   Progression toward goals Progressing toward goals              SLP Short Term Goals - 06/30/20 1149      SLP SHORT TERM GOAL #1   Title Pt will recall 3 word finding strategies to assist with finding words.    Time 4    Period Weeks    Status On-going      SLP SHORT TERM GOAL #2   Title Pt will produce single word without phonemic paraphasias given a single model.    Time 4    Period Weeks    Status On-going            SLP Long Term Goals - 06/30/20 1150      SLP LONG TERM GOAL #1   Title Pt will produce <2 phonemic errors in a structured sentence.    Time 8    Period Weeks    Status On-going      SLP LONG TERM GOAL #2   Title Pt will name 4 semantic features of a given object.  Time 8    Period Weeks    Status On-going            Plan - 06/30/20 1149    Clinical Impression Statement Pt is a 87 yo female who presents with expressive and receptive language deficits. Pt exhibited significant phonemic paraphasias in single words. Was receptive to verbal models and cueing. SLP gave BNT-2 e - short form and patient scored a 5/16.  Required extra time for processing. Endorses trouble with writing. Further assessment warranted of auditory comprehension. To assess next session. SLP rec skilled speech services to address expressive language deficit to increase pt's ability to verbalize needs and wants and increase patient's confidence.    Speech Therapy Frequency 2x / week    Duration 8 weeks    Treatment/Interventions Cueing hierarchy;Functional tasks;Patient/family education;Compensatory strategies;Environmental controls;Multimodal communcation  approach;Language facilitation;Compensatory techniques;Cognitive reorganization;SLP instruction and feedback;Internal/external aids    Potential to Achieve Goals Good    Consulted and Agree with Plan of Care Patient           Patient will benefit from skilled therapeutic intervention in order to improve the following deficits and impairments:   Aphasia    Problem List Patient Active Problem List   Diagnosis Date Noted  . Neuropathic pain   . Dysesthesia   . Prediabetes   . Expressive aphasia   . Atrial fibrillation ([place])   . 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 CVA (cerebral vascular accident) ([place]) 05/30/2020  . Orthostatic hypotension 01/26/2020  . Essential hypertension 01/26/2020  . Acute respiratory failure ([place]) 10/19/2019  . Colon polyp 10/19/2019  . Orthostatic dizziness 10/19/2019  . Hypothyroidism 10/19/2019  . Rapid atrial fibrillation ([place]) 10/12/2019  . Pneumonia 10/12/2019  . Anemia requiring transfusions 10/12/2019  . DNR (do not resuscitate) 10/12/2019  . Acquired thrombophilia ([place]) 10/12/2019  . Iron deficiency anemia 11/24/2017  . Paroxysmal atrial fibrillation ([place]) 11/24/2017  . Chronic depression 11/24/2017  . GERD (gastroesophageal reflux disease) 11/24/2017  . Breast cancer ([place]) 11/24/2017  . Hypothyroid 11/24/2017  . Dyslipidemia 11/24/2017    Verdene Lennert MS, [place], CBIS  06/30/2020, 11:50 AM  [place]. [place], [place], [address] Phone: [phone]   Fax:  [phone]   Name: Katrina Randolph MRN: [id number] Date of Birth: Jan 31, 1931

## 2020-06-30 NOTE — Therapy (Signed)
[place]. [place], [place], [address] Phone: [phone]   Fax:  [phone]  Physical Therapy Treatment  Patient Details  Name: Katrina Randolph MRN: [id number] Date of Birth: 11-Sep-1930 Referring Provider (PT): Posey Pronto   Encounter Date: 06/30/2020   PT End of Session - 06/30/20 1104    Visit Number 2    Number of Visits 17    Date for PT Re-Evaluation 08/21/20    Authorization Type UHC MCR- medical necessity    PT Start Time 1020    PT Stop Time 1100    PT Time Calculation (min) 40 min    Activity Tolerance Patient tolerated treatment well    Behavior During Therapy [facility] for tasks assessed/performed           Past Medical History:  Diagnosis Date  . Atrial fibrillation [facility])     Past Surgical History:  Procedure Laterality Date  . ABDOMINAL HYSTERECTOMY    . BIOPSY  10/19/2019   Procedure: BIOPSY;  Surgeon: [name], MD;  [facility];  Service: Gastroenterology;;  . BREAST LUMPECTOMY    . CHOLECYSTECTOMY    . COLONOSCOPY WITH PROPOFOL N/A 10/19/2019   Procedure: COLONOSCOPY WITH PROPOFOL;  Surgeon: [name], MD;  [facility];  Service: Gastroenterology;  Laterality: N/A;  . ESOPHAGOGASTRODUODENOSCOPY (EGD) WITH PROPOFOL N/A 10/19/2019   Procedure: ESOPHAGOGASTRODUODENOSCOPY (EGD) WITH PROPOFOL;  Surgeon: [name], MD;  [facility];  Service: Gastroenterology;  Laterality: N/A;  . POLYPECTOMY  10/19/2019   Procedure: POLYPECTOMY;  Surgeon: [name], MD;  [facility];  Service: Gastroenterology;;    There were no vitals filed for this visit.   Subjective Assessment - 06/30/20 1026    Subjective Reports doing well, not big issues    Currently in Pain? No/denies                             [facility] Adult PT Treatment/Exercise - 06/30/20 0001      Ambulation/Gait   Gait Comments gait with one light HHA x  100 feet, LOB with her looking around, had one instance at the end of the walk where she caught her toe      High Level Balance   High Level Balance Activities Side stepping;Backward walking;Direction changes    High Level Balance Comments static standing solid surface ball toss, head turns, on airex just standing, then some head turns and reaching, tried standing with a SPC and doing alternating toe touches on 4"      Exercises   Exercises Knee/Hip      Knee/Hip Exercises: Aerobic   Nustep level 3 x 5 minutes      Knee/Hip Exercises: Seated   Long Arc Quad Right;2 sets;10 reps    [facility] 20 reps    Marching Both;2 sets;10 reps    Marching Weights 2 lbs.                    PT Short Term Goals - 06/19/20 1116      PT SHORT TERM GOAL #1   Title Independent with initial HEP    Time 2    Period Weeks    Status New    Target Date 07/03/20             PT Long Term Goals - 06/19/20 1116      PT LONG TERM GOAL #1  Title Independent with advanced HEP    Time 8    Period Weeks    Status New    Target Date 08/21/20      PT LONG TERM GOAL #2   Title Pt will be able to complete 5TSTS and TUG independent and without LOB in </= 12 seconds to demonstrate improved functional strength, gait speed, and balance.    Baseline 06/19/2020: 5TSTS 23 seconds without UE support, TUG 22 seconds without walker and with CGA/gait belt    Time 8    Period Weeks    Status New    Target Date 08/21/20      PT LONG TERM GOAL #3   Title Pt will score at least 46/56 on the berg balance scale to demonstrate decreased falls risk    Baseline 06/19/20: 36/56    Time 8    Period Weeks    Status New    Target Date 08/21/20      PT LONG TERM GOAL #4   Title BLE strength at least 4+/5    Time 8    Period Weeks    Status New    Target Date 08/21/20      PT LONG TERM GOAL #5   Title Pt will be able to ambulate indoor and outdoor uneven surfaces and negotiate obstacles with LRAD and no  LOB to facilitate safety within home and community environments.    Baseline depending on FWW, decreases speed around turns    Time 8    Period Weeks    Status New    Target Date 08/21/20                 Plan - 06/30/20 1104    Clinical Impression Statement Initiated exercises and balance activities, she has left knee OA so we were careful to not irritate this, with balance head turns are difficult for her, she has the most difficulty with dynamic surface standing, even when static standing she has to work very hard to balance with this.  The other issue is shortness of breath with activities, she blames this on the a-fib    PT Next Visit Plan work on her balance and functional safety    Consulted and Agree with Plan of Care Patient           Patient will benefit from skilled therapeutic intervention in order to improve the following deficits and impairments:  Abnormal gait,Decreased endurance,Pain,Decreased balance,Decreased mobility,Decreased strength,Impaired sensation  Visit Diagnosis: Unsteadiness on feet  Muscle weakness (generalized)  Difficulty in walking, not elsewhere classified     Problem List Patient Active Problem List   Diagnosis Date Noted  . Neuropathic pain   . Dysesthesia   . Prediabetes   . Expressive aphasia   . Atrial fibrillation ([place])   . 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 CVA (cerebral vascular accident) ([place]) 05/30/2020  . Orthostatic hypotension 01/26/2020  . Essential hypertension 01/26/2020  . Acute respiratory failure ([place]) 10/19/2019  . Colon polyp 10/19/2019  . Orthostatic dizziness 10/19/2019  . Hypothyroidism 10/19/2019  . Rapid atrial fibrillation ([place]) 10/12/2019  . Pneumonia 10/12/2019  . Anemia requiring transfusions 10/12/2019  . DNR (do not resuscitate) 10/12/2019  . Acquired thrombophilia ([place]) 10/12/2019  . Iron deficiency anemia 11/24/2017  . Paroxysmal atrial fibrillation ([place]) 11/24/2017  .  Chronic depression 11/24/2017  . GERD (gastroesophageal reflux disease) 11/24/2017  . Breast cancer ([place]) 11/24/2017  . Hypothyroid 11/24/2017  . Dyslipidemia 11/24/2017  Sumner Boast., PT 06/30/2020, 11:10 AM  [place]. [place], [place], [address] Phone: [phone]   Fax:  [phone]  Name: Katrina Randolph MRN: [id number] Date of Birth: 10-Sep-1930

## 2020-07-03 ENCOUNTER — Ambulatory Visit: Payer: Medicare Other | Admitting: Speech Pathology

## 2020-07-03 ENCOUNTER — Encounter: Payer: Self-pay | Admitting: Speech Pathology

## 2020-07-03 ENCOUNTER — Ambulatory Visit: Payer: Medicare Other

## 2020-07-03 ENCOUNTER — Other Ambulatory Visit: Payer: Self-pay

## 2020-07-03 VITALS — BP 118/93

## 2020-07-03 DIAGNOSIS — R4701 Aphasia: Secondary | ICD-10-CM

## 2020-07-03 DIAGNOSIS — R252 Cramp and spasm: Secondary | ICD-10-CM

## 2020-07-03 DIAGNOSIS — I63512 Cerebral infarction due to unspecified occlusion or stenosis of left middle cerebral artery: Secondary | ICD-10-CM

## 2020-07-03 DIAGNOSIS — R262 Difficulty in walking, not elsewhere classified: Secondary | ICD-10-CM

## 2020-07-03 DIAGNOSIS — R2681 Unsteadiness on feet: Secondary | ICD-10-CM

## 2020-07-03 DIAGNOSIS — M6281 Muscle weakness (generalized): Secondary | ICD-10-CM

## 2020-07-03 NOTE — Therapy (Signed)
[place]. [place], [place], [address] Phone: [phone]   Fax:  [phone]  Physical Therapy Treatment  Patient Details  Name: Katrina Randolph MRN: [id number] Date of Birth: 19-Jan-1931 Referring Provider (PT): Posey Pronto   Encounter Date: 07/03/2020   PT End of Session - 07/03/20 1425    Visit Number 3    Number of Visits 17    Date for PT Re-Evaluation 08/21/20    Authorization Type UHC MCR- medical necessity    PT Start Time 6301    PT Stop Time 1445    PT Time Calculation (min) 37 min    Equipment Utilized During Treatment Gait belt    Activity Tolerance Patient tolerated treatment well    Behavior During Therapy [facility] for tasks assessed/performed           Past Medical History:  Diagnosis Date  . Atrial fibrillation [facility])     Past Surgical History:  Procedure Laterality Date  . ABDOMINAL HYSTERECTOMY    . BIOPSY  10/19/2019   Procedure: BIOPSY;  Surgeon: [name], MD;  [facility];  Service: Gastroenterology;;  . BREAST LUMPECTOMY    . CHOLECYSTECTOMY    . COLONOSCOPY WITH PROPOFOL N/A 10/19/2019   Procedure: COLONOSCOPY WITH PROPOFOL;  Surgeon: [name], MD;  [facility];  Service: Gastroenterology;  Laterality: N/A;  . ESOPHAGOGASTRODUODENOSCOPY (EGD) WITH PROPOFOL N/A 10/19/2019   Procedure: ESOPHAGOGASTRODUODENOSCOPY (EGD) WITH PROPOFOL;  Surgeon: [name], MD;  [facility];  Service: Gastroenterology;  Laterality: N/A;  . POLYPECTOMY  10/19/2019   Procedure: POLYPECTOMY;  Surgeon: [name], MD;  [facility];  Service: Gastroenterology;;    Vitals:   07/03/20 1424  BP: (!) 118/93     Subjective Assessment - 07/03/20 1409    Subjective just woke up not feeling too well, nothing new though. Reports has had some swelling in the feet    Patient Stated Goals to be able to improve speech, to walk without walker     Currently in Pain? No/denies              [facility] PT Assessment - 07/03/20 0001      Palpation   Palpation comment nonpitting edema B ankles                         [facility] Adult PT Treatment/Exercise - 07/03/20 0001      Ambulation/Gait   Gait Comments gait with one light HHA x 100 feet, LOB with her looking around, a few instances of veering to the left      High Level Balance   High Level Balance Activities Side stepping;Backward walking;Direction changes      Knee/Hip Exercises: Seated   Long Arc Quad Right;2 sets;10 reps    Long Arc Quad Weight --   1.5   Ball Squeeze 20 reps    Other Seated Knee/Hip Exercises heel/toe raises x 10 B. manual light  resisted hip ABD/ADD RLE only x 10    Marching Both;2 sets;10 reps    Marching Weights --   1.5                   PT Short Term Goals - 06/19/20 1116      PT SHORT TERM GOAL #1   Title Independent with initial HEP    Time 2    Period Weeks    Status New    Target  Date 07/03/20             PT Long Term Goals - 06/19/20 1116      PT LONG TERM GOAL #1   Title Independent with advanced HEP    Time 8    Period Weeks    Status New    Target Date 08/21/20      PT LONG TERM GOAL #2   Title Pt will be able to complete 5TSTS and TUG independent and without LOB in </= 12 seconds to demonstrate improved functional strength, gait speed, and balance.    Baseline 06/19/2020: 5TSTS 23 seconds without UE support, TUG 22 seconds without walker and with CGA/gait belt    Time 8    Period Weeks    Status New    Target Date 08/21/20      PT LONG TERM GOAL #3   Title Pt will score at least 46/56 on the berg balance scale to demonstrate decreased falls risk    Baseline 06/19/20: 36/56    Time 8    Period Weeks    Status New    Target Date 08/21/20      PT LONG TERM GOAL #4   Title BLE strength at least 4+/5    Time 8    Period Weeks    Status New    Target Date 08/21/20      PT LONG TERM GOAL #5    Title Pt will be able to ambulate indoor and outdoor uneven surfaces and negotiate obstacles with LRAD and no LOB to facilitate safety within home and community environments.    Baseline depending on FWW, decreases speed around turns    Time 8    Period Weeks    Status New    Target Date 08/21/20                 Plan - 07/03/20 1427    Clinical Impression Statement Further progressed gait and balanc ewith more instances of instability especially with veering towards the left side with dynamic activities with HHA - she is aware of this but does require some cues to correct. Did get a little breathless after sit to stands today, and she was offered seated rest with VS monitoring to recover. HR range 120 bpm to 80 bpm, O2 sat above 94%. She notified me that she noticed some increased swelling in her feet, I advised she follow up with cardiologist regarding this.    PT Next Visit Plan work on her balance and functional safety    Consulted and Agree with Plan of Care Patient           Patient will benefit from skilled therapeutic intervention in order to improve the following deficits and impairments:  Abnormal gait,Decreased endurance,Pain,Decreased balance,Decreased mobility,Decreased strength,Impaired sensation  Visit Diagnosis: Unsteadiness on feet  Muscle weakness (generalized)  Difficulty in walking, not elsewhere classified  Left middle cerebral artery stroke ([place])  Cramp and spasm     Problem List Patient Active Problem List   Diagnosis Date Noted  . Neuropathic pain   . Dysesthesia   . Prediabetes   . Expressive aphasia   . Atrial fibrillation ([place])   . 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 CVA (cerebral vascular accident) ([place]) 05/30/2020  . Orthostatic hypotension 01/26/2020  . Essential hypertension 01/26/2020  . Acute respiratory failure ([place]) 10/19/2019  . Colon polyp 10/19/2019  . Orthostatic dizziness 10/19/2019  . Hypothyroidism  10/19/2019  . Rapid atrial  fibrillation ([place]) 10/12/2019  . Pneumonia 10/12/2019  . Anemia requiring transfusions 10/12/2019  . DNR (do not resuscitate) 10/12/2019  . Acquired thrombophilia ([place]) 10/12/2019  . Iron deficiency anemia 11/24/2017  . Paroxysmal atrial fibrillation ([place]) 11/24/2017  . Chronic depression 11/24/2017  . GERD (gastroesophageal reflux disease) 11/24/2017  . Breast cancer ([place]) 11/24/2017  . Hypothyroid 11/24/2017  . Dyslipidemia 11/24/2017    Hall Busing, PT, DPT 07/03/2020, 3:58 PM  [place]. [place], [place], [address] Phone: [phone]   Fax:  [phone]  Name: Katrina Randolph MRN: [id number] Date of Birth: 1930/04/07

## 2020-07-03 NOTE — Therapy (Signed)
[place]. [place], [place], [address] Phone: [phone]   Fax:  [phone]  Speech Language Pathology Treatment  Patient Details  Name: Katrina Randolph MRN: [id number] Date of Birth: [birth year] Referring Provider (SLP): Jamse Arn MD   Encounter Date: 07/03/2020   End of Session - 07/03/20 1531    Visit Number 3    Number of Visits 17    Date for SLP Re-Evaluation 08/19/20    SLP Start Time 1445    SLP Stop Time  9371    SLP Time Calculation (min) 45 min    Activity Tolerance Patient tolerated treatment well           Past Medical History:  Diagnosis Date  . Atrial fibrillation [facility])     Past Surgical History:  Procedure Laterality Date  . ABDOMINAL HYSTERECTOMY    . BIOPSY  10/19/2019   Procedure: BIOPSY;  Surgeon: [name], MD;  [facility];  Service: Gastroenterology;;  . BREAST LUMPECTOMY    . CHOLECYSTECTOMY    . COLONOSCOPY WITH PROPOFOL N/A 10/19/2019   Procedure: COLONOSCOPY WITH PROPOFOL;  Surgeon: [name], MD;  [facility];  Service: Gastroenterology;  Laterality: N/A;  . ESOPHAGOGASTRODUODENOSCOPY (EGD) WITH PROPOFOL N/A 10/19/2019   Procedure: ESOPHAGOGASTRODUODENOSCOPY (EGD) WITH PROPOFOL;  Surgeon: [name], MD;  [facility];  Service: Gastroenterology;  Laterality: N/A;  . POLYPECTOMY  10/19/2019   Procedure: POLYPECTOMY;  Surgeon: [name], MD;  [facility];  Service: Gastroenterology;;    There were no vitals filed for this visit.          ADULT SLP TREATMENT - 07/03/20 1524      General Information   Behavior/Cognition Alert;Pleasant mood      Treatment Provided   Treatment provided Cognitive-Linquistic      Cognitive-Linquistic Treatment   Treatment focused on Aphasia    Skilled Treatment SLP trained pt in sound-to-letter correspondance using Phonological Treatment.       Assessment / Recommendations / Plan   Plan Continue with current plan of care      Progression Toward Goals   Progression toward goals Progressing toward goals              SLP Short Term Goals - 07/03/20 1532      SLP SHORT TERM GOAL #1   Title Pt will recall 3 word finding strategies to assist with finding words.    Time 3    Period Weeks    Status On-going      SLP SHORT TERM GOAL #2   Title Pt will produce single word without phonemic paraphasias given a single model.    Time 3    Period Weeks    Status On-going            SLP Long Term Goals - 07/03/20 1532      SLP LONG TERM GOAL #1   Title Pt will produce <2 phonemic errors in a structured sentence.    Time 7    Period Weeks    Status On-going      SLP LONG TERM GOAL #2   Title Pt will name 4 semantic features of a given object.    Time 7    Period Weeks    Status On-going            Plan - 07/03/20 1531    Clinical Impression Statement Pt required several repetitions of words. Difficulty processing  words and sounds. Made improvements today in Phonological Treatment.    Speech Therapy Frequency 2x / week    Duration 8 weeks    Treatment/Interventions Cueing hierarchy;Functional tasks;Patient/family education;Compensatory strategies;Environmental controls;Multimodal communcation approach;Language facilitation;Compensatory techniques;Cognitive reorganization;SLP instruction and feedback;Internal/external aids    Potential to Achieve Goals Good    Consulted and Agree with Plan of Care Patient           Patient will benefit from skilled therapeutic intervention in order to improve the following deficits and impairments:   Aphasia    Problem List Patient Active Problem List   Diagnosis Date Noted  . Neuropathic pain   . Dysesthesia   . Prediabetes   . Expressive aphasia   . Atrial fibrillation ([place])   . 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 CVA (cerebral vascular accident)  ([place]) 05/30/2020  . Orthostatic hypotension 01/26/2020  . Essential hypertension 01/26/2020  . Acute respiratory failure ([place]) 10/19/2019  . Colon polyp 10/19/2019  . Orthostatic dizziness 10/19/2019  . Hypothyroidism 10/19/2019  . Rapid atrial fibrillation ([place]) 10/12/2019  . Pneumonia 10/12/2019  . Anemia requiring transfusions 10/12/2019  . DNR (do not resuscitate) 10/12/2019  . Acquired thrombophilia ([place]) 10/12/2019  . Iron deficiency anemia 11/24/2017  . Paroxysmal atrial fibrillation ([place]) 11/24/2017  . Chronic depression 11/24/2017  . GERD (gastroesophageal reflux disease) 11/24/2017  . Breast cancer ([place]) 11/24/2017  . Hypothyroid 11/24/2017  . Dyslipidemia 11/24/2017    Verdene Lennert MS, [place], CBIS  07/03/2020, 3:33 PM  [place]. [place], [place], [address] Phone: [phone]   Fax:  [phone]   Name: Katrina Randolph MRN: [id number] Date of Birth: 16-Oct-1930

## 2020-07-05 ENCOUNTER — Encounter: Payer: Self-pay | Admitting: Speech Pathology

## 2020-07-05 ENCOUNTER — Inpatient Hospital Stay: Payer: Medicare Other | Admitting: Registered Nurse

## 2020-07-05 ENCOUNTER — Ambulatory Visit: Payer: Medicare Other

## 2020-07-05 ENCOUNTER — Ambulatory Visit: Payer: Medicare Other | Admitting: Speech Pathology

## 2020-07-05 ENCOUNTER — Other Ambulatory Visit: Payer: Self-pay

## 2020-07-05 DIAGNOSIS — R252 Cramp and spasm: Secondary | ICD-10-CM

## 2020-07-05 DIAGNOSIS — R4701 Aphasia: Secondary | ICD-10-CM

## 2020-07-05 DIAGNOSIS — I63512 Cerebral infarction due to unspecified occlusion or stenosis of left middle cerebral artery: Secondary | ICD-10-CM | POA: Diagnosis not present

## 2020-07-05 DIAGNOSIS — M6281 Muscle weakness (generalized): Secondary | ICD-10-CM

## 2020-07-05 DIAGNOSIS — R262 Difficulty in walking, not elsewhere classified: Secondary | ICD-10-CM

## 2020-07-05 DIAGNOSIS — R2681 Unsteadiness on feet: Secondary | ICD-10-CM

## 2020-07-05 NOTE — Therapy (Signed)
[place]. [place], [place], [address] Phone: [phone]   Fax:  [phone]  Speech Language Pathology Treatment  Patient Details  Name: Katrina Randolph MRN: [id number] Date of Birth: November 03, 1930 Referring Provider (SLP): Jamse Arn MD   Encounter Date: 07/05/2020   End of Session - 07/05/20 1410    Visit Number 4    Number of Visits 17    Date for SLP Re-Evaluation 08/19/20    SLP Start Time 0240    SLP Stop Time  1443    SLP Time Calculation (min) 38 min    Activity Tolerance Patient tolerated treatment well           Past Medical History:  Diagnosis Date  . Atrial fibrillation [facility])     Past Surgical History:  Procedure Laterality Date  . ABDOMINAL HYSTERECTOMY    . BIOPSY  10/19/2019   Procedure: BIOPSY;  Surgeon: [name], MD;  [facility];  Service: Gastroenterology;;  . BREAST LUMPECTOMY    . CHOLECYSTECTOMY    . COLONOSCOPY WITH PROPOFOL N/A 10/19/2019   Procedure: COLONOSCOPY WITH PROPOFOL;  Surgeon: [name], MD;  [facility];  Service: Gastroenterology;  Laterality: N/A;  . ESOPHAGOGASTRODUODENOSCOPY (EGD) WITH PROPOFOL N/A 10/19/2019   Procedure: ESOPHAGOGASTRODUODENOSCOPY (EGD) WITH PROPOFOL;  Surgeon: [name], MD;  [facility];  Service: Gastroenterology;  Laterality: N/A;  . POLYPECTOMY  10/19/2019   Procedure: POLYPECTOMY;  Surgeon: [name], MD;  [facility];  Service: Gastroenterology;;    There were no vitals filed for this visit.   Subjective Assessment - 07/05/20 1408    Subjective "i just am not feeling well. I am dealing with this swelling."    Currently in Pain? No/denies                 ADULT SLP TREATMENT - 07/05/20 1443      General Information   Behavior/Cognition Alert;Pleasant mood      Treatment Provided   Treatment provided Cognitive-Linquistic       Cognitive-Linquistic Treatment   Treatment focused on Aphasia    Skilled Treatment Assessed pt with word finding during naming task. SLP attempted to inhibit patient by counting 5 seconds before she was allowed to answer. Pt eventually was able to inhibit self before answering - this significantly impacted and increased her accuracy in naming.      Assessment / Recommendations / Plan   Plan Continue with current plan of care      Progression Toward Goals   Progression toward goals Progressing toward goals              SLP Short Term Goals - 07/05/20 1426      SLP SHORT TERM GOAL #1   Title Pt will recall 3 word finding strategies to assist with finding words.    Time 3    Period Weeks    Status On-going      SLP SHORT TERM GOAL #2   Title Pt will produce single word without phonemic paraphasias given a single model.    Time 3    Period Weeks    Status On-going            SLP Long Term Goals - 07/05/20 1427      SLP LONG TERM GOAL #1   Title Pt will produce <2 phonemic errors in a structured sentence.    Time 7    Period Weeks  Status On-going      SLP LONG TERM GOAL #2   Title Pt will name 4 semantic features of a given object.    Time 7    Period Weeks    Status On-going            Plan - 07/05/20 1444    Clinical Impression Statement Required inhibition of speaking before naming to increase accuracy. Pt responded favoraly.    Speech Therapy Frequency 2x / week    Duration 8 weeks    Treatment/Interventions Cueing hierarchy;Functional tasks;Patient/family education;Compensatory strategies;Environmental controls;Multimodal communcation approach;Language facilitation;Compensatory techniques;Cognitive reorganization;SLP instruction and feedback;Internal/external aids    Potential to Achieve Goals Good    Consulted and Agree with Plan of Care Patient           Patient will benefit from skilled therapeutic intervention in order to improve the following  deficits and impairments:   Aphasia    Problem List Patient Active Problem List   Diagnosis Date Noted  . Neuropathic pain   . Dysesthesia   . Prediabetes   . Expressive aphasia   . Atrial fibrillation ([place])   . 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 CVA (cerebral vascular accident) ([place]) 05/30/2020  . Orthostatic hypotension 01/26/2020  . Essential hypertension 01/26/2020  . Acute respiratory failure ([place]) 10/19/2019  . Colon polyp 10/19/2019  . Orthostatic dizziness 10/19/2019  . Hypothyroidism 10/19/2019  . Rapid atrial fibrillation ([place]) 10/12/2019  . Pneumonia 10/12/2019  . Anemia requiring transfusions 10/12/2019  . DNR (do not resuscitate) 10/12/2019  . Acquired thrombophilia ([place]) 10/12/2019  . Iron deficiency anemia 11/24/2017  . Paroxysmal atrial fibrillation ([place]) 11/24/2017  . Chronic depression 11/24/2017  . GERD (gastroesophageal reflux disease) 11/24/2017  . Breast cancer ([place]) 11/24/2017  . Hypothyroid 11/24/2017  . Dyslipidemia 11/24/2017    Verdene Lennert MS, CCC-SLP, CBIS] 07/05/2020, 2:46 PM  [place]. [place], [place], [address] Phone: [phone]   Fax:  [phone]   Name: Katrina Randolph MRN: [id number] Date of Birth: December 19, 1930

## 2020-07-05 NOTE — Therapy (Signed)
[place] [place], [place], [address] Phone: [phone]   Fax:  [phone]  Physical Therapy Treatment  Patient Details  Name: Katrina Randolph MRN: [id number] Date of Birth: 1930-12-13 Referring Provider (PT): Posey Pronto   Encounter Date: 07/05/2020   PT End of Session - 07/05/20 1434    Visit Number 4    Number of Visits 17    Date for PT Re-Evaluation 08/21/20    Authorization Type UHC MCR- medical necessity    PT Start Time 3846    PT Stop Time 1525    PT Time Calculation (min) 40 min    Equipment Utilized During Treatment Gait belt    Activity Tolerance Patient tolerated treatment well    Behavior During Therapy [facility] for tasks assessed/performed           Past Medical History:  Diagnosis Date  . Atrial fibrillation [facility])     Past Surgical History:  Procedure Laterality Date  . ABDOMINAL HYSTERECTOMY    . BIOPSY  10/19/2019   Procedure: BIOPSY;  Surgeon: [name], MD;  [facility];  Service: Gastroenterology;;  . BREAST LUMPECTOMY    . CHOLECYSTECTOMY    . COLONOSCOPY WITH PROPOFOL N/A 10/19/2019   Procedure: COLONOSCOPY WITH PROPOFOL;  Surgeon: [name], MD;  [facility];  Service: Gastroenterology;  Laterality: N/A;  . ESOPHAGOGASTRODUODENOSCOPY (EGD) WITH PROPOFOL N/A 10/19/2019   Procedure: ESOPHAGOGASTRODUODENOSCOPY (EGD) WITH PROPOFOL;  Surgeon: [name], MD;  [facility];  Service: Gastroenterology;  Laterality: N/A;  . POLYPECTOMY  10/19/2019   Procedure: POLYPECTOMY;  Surgeon: [name], MD;  [facility];  Service: Gastroenterology;;    There were no vitals filed for this visit.   Subjective Assessment - 07/05/20 1448    Subjective "still not feeling too well" still some foot swelling. Have not called heart doctor    Patient Stated Goals to be able to improve speech, to walk without walker    Currently in Pain?  Yes    Pain Score --   left knee bothersome                            [facility] Adult PT Treatment/Exercise - 07/05/20 0001      Knee/Hip Exercises: Aerobic   Nustep L1 x 2 minutes   lef tknee hurting     Knee/Hip Exercises: Seated   Long Arc Quad Right;2 sets;10 reps    Long Arc Quad Weight 2 lbs.    Ball Squeeze 20 reps    Other Seated Knee/Hip Exercises heel/toe raises  x 20 , mimicking PT motions for coordination, alternating mvmts.    Other Seated Knee/Hip Exercises hip abd red TB X 20    Marching Both;2 sets;10 reps    Marching Limitations red TB    Hamstring Curl Strengthening;Both;2 sets;10 reps    Hamstring Limitations red tb      Modalities   Modalities Cryotherapy      Cryotherapy   Number Minutes Cryotherapy 5 Minutes    Cryotherapy Location Knee   left   Type of Cryotherapy Ice pack                    PT Short Term Goals - 06/19/20 1116      PT SHORT TERM GOAL #1   Title Independent with initial HEP    Time 2    Period Weeks  Status New    Target Date 07/03/20             PT Long Term Goals - 06/19/20 1116      PT LONG TERM GOAL #1   Title Independent with advanced HEP    Time 8    Period Weeks    Status New    Target Date 08/21/20      PT LONG TERM GOAL #2   Title Pt will be able to complete 5TSTS and TUG independent and without LOB in </= 12 seconds to demonstrate improved functional strength, gait speed, and balance.    Baseline 06/19/2020: 5TSTS 23 seconds without UE support, TUG 22 seconds without walker and with CGA/gait belt    Time 8    Period Weeks    Status New    Target Date 08/21/20      PT LONG TERM GOAL #3   Title Pt will score at least 46/56 on the berg balance scale to demonstrate decreased falls risk    Baseline 06/19/20: 36/56    Time 8    Period Weeks    Status New    Target Date 08/21/20      PT LONG TERM GOAL #4   Title BLE strength at least 4+/5    Time 8    Period Weeks    Status New     Target Date 08/21/20      PT LONG TERM GOAL #5   Title Pt will be able to ambulate indoor and outdoor uneven surfaces and negotiate obstacles with LRAD and no LOB to facilitate safety within home and community environments.    Baseline depending on FWW, decreases speed around turns    Time 8    Period Weeks    Status New    Target Date 08/21/20                 Plan - 07/05/20 1435    Clinical Impression Statement Focus of todays session on some seated exercises for general strength, coordination. Limited today d/t fatigue and left knee pain today. Continued swelling, nonpitting at B ankles. continued to advise pt to follow up with cardiology about this. Also discussed using her compression stockings as she reports she notices this worsens as the day goes on, no so bad in the mornings.    PT Next Visit Plan work on her balance and functional safety    Consulted and Agree with Plan of Care Patient           Patient will benefit from skilled therapeutic intervention in order to improve the following deficits and impairments:  Abnormal gait,Decreased endurance,Pain,Decreased balance,Decreased mobility,Decreased strength,Impaired sensation  Visit Diagnosis: Unsteadiness on feet  Muscle weakness (generalized)  Difficulty in walking, not elsewhere classified  Left middle cerebral artery stroke ([place])  Cramp and spasm     Problem List Patient Active Problem List   Diagnosis Date Noted  . Neuropathic pain   . Dysesthesia   . Prediabetes   . Expressive aphasia   . Atrial fibrillation ([place])   . 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 CVA (cerebral vascular accident) ([place]) 05/30/2020  . Orthostatic hypotension 01/26/2020  . Essential hypertension 01/26/2020  . Acute respiratory failure ([place]) 10/19/2019  . Colon polyp 10/19/2019  . Orthostatic dizziness 10/19/2019  . Hypothyroidism 10/19/2019  . Rapid atrial fibrillation ([place]) 10/12/2019  . Pneumonia  10/12/2019  . Anemia requiring transfusions 10/12/2019  . DNR (do not resuscitate) 10/12/2019  .  Acquired thrombophilia ([place]) 10/12/2019  . Iron deficiency anemia 11/24/2017  . Paroxysmal atrial fibrillation ([place]) 11/24/2017  . Chronic depression 11/24/2017  . GERD (gastroesophageal reflux disease) 11/24/2017  . Breast cancer ([place]) 11/24/2017  . Hypothyroid 11/24/2017  . Dyslipidemia 11/24/2017    Hall Busing , PT, DPT 07/05/2020, 3:27 PM  [address]. [place], [place], [address] Phone: [phone]   Fax:  [phone]  Name: Naveah Brave MRN: [id number] Date of Birth: 25-Jul-1930

## 2020-07-11 ENCOUNTER — Ambulatory Visit: Payer: Medicare Other | Admitting: Physical Therapy

## 2020-07-11 ENCOUNTER — Encounter: Payer: Self-pay | Admitting: Physical Therapy

## 2020-07-11 ENCOUNTER — Encounter: Payer: Self-pay | Admitting: Speech Pathology

## 2020-07-11 ENCOUNTER — Other Ambulatory Visit: Payer: Self-pay

## 2020-07-11 ENCOUNTER — Ambulatory Visit: Payer: Medicare Other | Admitting: Speech Pathology

## 2020-07-11 DIAGNOSIS — I63512 Cerebral infarction due to unspecified occlusion or stenosis of left middle cerebral artery: Secondary | ICD-10-CM | POA: Diagnosis not present

## 2020-07-11 DIAGNOSIS — R4701 Aphasia: Secondary | ICD-10-CM

## 2020-07-11 DIAGNOSIS — M6281 Muscle weakness (generalized): Secondary | ICD-10-CM

## 2020-07-11 DIAGNOSIS — R262 Difficulty in walking, not elsewhere classified: Secondary | ICD-10-CM

## 2020-07-11 DIAGNOSIS — R2681 Unsteadiness on feet: Secondary | ICD-10-CM

## 2020-07-11 NOTE — Therapy (Signed)
Katrina Randolph. [place], [place], [address] Phone: [phone]   Fax:  [phone]  Physical Therapy Treatment  Patient Details  Name: Katrina Randolph MRN: [id number] Date of Birth: April 22, 1930 Referring Provider (PT): Katrina Randolph   Encounter Date: 07/11/2020   PT End of Session - 07/11/20 1434    Visit Number 5    Number of Visits 17    Date for PT Re-Evaluation 08/21/20    Authorization Type UHC MCR- medical necessity    PT Start Time 9528    PT Stop Time 1359    PT Time Calculation (min) 41 min    Activity Tolerance Patient tolerated treatment well    Behavior During Therapy [facility] for tasks assessed/performed           Past Medical History:  Diagnosis Date  . Atrial fibrillation [facility])     Past Surgical History:  Procedure Laterality Date  . ABDOMINAL HYSTERECTOMY    . BIOPSY  10/19/2019   Procedure: BIOPSY;  Surgeon: Katrina Brace, MD;  [facility];  Service: Gastroenterology;;  . BREAST LUMPECTOMY    . CHOLECYSTECTOMY    . COLONOSCOPY WITH PROPOFOL N/A 10/19/2019   Procedure: COLONOSCOPY WITH PROPOFOL;  Surgeon: Katrina Brace, MD;  [facility];  Service: Gastroenterology;  Laterality: N/A;  . ESOPHAGOGASTRODUODENOSCOPY (EGD) WITH PROPOFOL N/A 10/19/2019   Procedure: ESOPHAGOGASTRODUODENOSCOPY (EGD) WITH PROPOFOL;  Surgeon: Katrina Brace, MD;  [facility];  Service: Gastroenterology;  Laterality: N/A;  . POLYPECTOMY  10/19/2019   Procedure: POLYPECTOMY;  Surgeon: Katrina Brace, MD;  [facility];  Service: Gastroenterology;;    There were no vitals filed for this visit.   Subjective Assessment - 07/11/20 1323    Subjective I have on my compression socks today, reports that over the weekend it got better, denies pain or falls    Currently in Pain? No/denies                             [facility] Adult PT Treatment/Exercise - 07/11/20 0001       High Level Balance   High Level Balance Activities Backward walking;Side stepping;Sudden stops;Direction changes;Head turns    High Level Balance Comments static standing solid surface ball toss, head turns, on airex just standing, then some head turns and reaching, tried standing with a SPC and doing alternating toe touches on 4", airex balance beam tandem walk and then side stepping this was performed in the Pbars, walking ball toss      Knee/Hip Exercises: Aerobic   Nustep level 3 x 4 minutes rested at 2 minutes      Knee/Hip Exercises: Seated   Long Arc Quad Right;2 sets;10 reps    Long Arc Quad Weight 2 lbs.    Marching Both;2 sets;10 reps    Marching Weights 2 lbs.                    PT Short Term Goals - 06/19/20 1116      PT SHORT TERM GOAL #1   Title Independent with initial HEP    Time 2    Period Weeks    Status New    Target Date 07/03/20             PT Long Term Goals - 07/11/20 1436      PT LONG TERM GOAL #1   Title Independent with advanced HEP  Status On-going      PT LONG TERM GOAL #2   Title Pt will be able to complete 5TSTS and TUG independent and without LOB in </= 12 seconds to demonstrate improved functional strength, gait speed, and balance.    Status On-going      PT LONG TERM GOAL #3   Title Pt will score at least 46/56 on the berg balance scale to demonstrate decreased falls risk    Status On-going      PT LONG TERM GOAL #4   Title BLE strength at least 4+/5    Status On-going                 Plan - 07/11/20 1434    Clinical Impression Statement I did a lot of walking with her in the clinic with close supervision, with head turns, ball toss or is she was distracted she would easily lose her balance requiring CGA to right the LOB.  With the walker she does well, but at times she is so independent that she does not use it correctly, however she needs something due to her easily losing her balance when she is  distracted    PT Next Visit Plan work on her balance and functional safety    Consulted and Agree with Plan of Care Patient           Patient will benefit from skilled therapeutic intervention in order to improve the following deficits and impairments:  Abnormal gait,Decreased endurance,Pain,Decreased balance,Decreased mobility,Decreased strength,Impaired sensation  Visit Diagnosis: Unsteadiness on feet  Muscle weakness (generalized)  Difficulty in walking, not elsewhere classified     Problem List Patient Active Problem List   Diagnosis Date Noted  . Neuropathic pain   . Dysesthesia   . Prediabetes   . Expressive aphasia   . Atrial fibrillation ([place])   . 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 CVA (cerebral vascular accident) ([place]) 05/30/2020  . Orthostatic hypotension 01/26/2020  . Essential hypertension 01/26/2020  . Acute respiratory failure ([place]) 10/19/2019  . Colon polyp 10/19/2019  . Orthostatic dizziness 10/19/2019  . Hypothyroidism 10/19/2019  . Rapid atrial fibrillation ([place]) 10/12/2019  . Pneumonia 10/12/2019  . Anemia requiring transfusions 10/12/2019  . DNR (do not resuscitate) 10/12/2019  . Acquired thrombophilia ([place]) 10/12/2019  . Iron deficiency anemia 11/24/2017  . Paroxysmal atrial fibrillation ([place]) 11/24/2017  . Chronic depression 11/24/2017  . GERD (gastroesophageal reflux disease) 11/24/2017  . Breast cancer ([place]) 11/24/2017  . Hypothyroid 11/24/2017  . Dyslipidemia 11/24/2017    Katrina Boast., PT 07/11/2020, 2:37 PM  [address]. [place], [place], [address] Phone: [phone]   Fax:  [phone]  Name: Katrina Randolph MRN: [id number] Date of Birth: [birth year]

## 2020-07-11 NOTE — Therapy (Signed)
[address]. [place], [place], [address] Phone: [phone]   Fax:  [phone]  Speech Language Pathology Treatment  Patient Details  Name: Katrina Randolph MRN: [id number] Date of Birth: May 10, 1930 Referring Provider (SLP): Jamse Arn MD   Encounter Date: 07/11/2020   End of Session - 07/11/20 1433    Visit Number 5    Number of Visits 17    Date for SLP Re-Evaluation 08/19/20    SLP Start Time 56    SLP Stop Time  1440    SLP Time Calculation (min) 40 min    Activity Tolerance Patient tolerated treatment well           Past Medical History:  Diagnosis Date  . Atrial fibrillation [facility])     Past Surgical History:  Procedure Laterality Date  . ABDOMINAL HYSTERECTOMY    . BIOPSY  10/19/2019   Procedure: BIOPSY;  Surgeon: [name], MD;  [facility];  Service: Gastroenterology;;  . BREAST LUMPECTOMY    . CHOLECYSTECTOMY    . COLONOSCOPY WITH PROPOFOL N/A 10/19/2019   Procedure: COLONOSCOPY WITH PROPOFOL;  Surgeon: [name], MD;  [facility];  Service: Gastroenterology;  Laterality: N/A;  . ESOPHAGOGASTRODUODENOSCOPY (EGD) WITH PROPOFOL N/A 10/19/2019   Procedure: ESOPHAGOGASTRODUODENOSCOPY (EGD) WITH PROPOFOL;  Surgeon: [name], MD;  [facility];  Service: Gastroenterology;  Laterality: N/A;  . POLYPECTOMY  10/19/2019   Procedure: POLYPECTOMY;  Surgeon: [name], MD;  [facility];  Service: Gastroenterology;;    There were no vitals filed for this visit.   Subjective Assessment - 07/11/20 1402    Subjective Pt reports she is doing well.    Currently in Pain? No/denies                 ADULT SLP TREATMENT - 07/11/20 1435      General Information   Behavior/Cognition Alert;Pleasant mood      Treatment Provided   Treatment provided Cognitive-Linquistic      Cognitive-Linquistic Treatment   Treatment  focused on Aphasia    Skilled Treatment Assessed pt with word finding during naming task. SLP attempted to inhibit patient by counting 5 seconds before she was allowed to answer. Pt eventually was able to inhibit self before answering - this significantly impacted and increased her accuracy in naming. Increased phonemic paraphasias towards end of session.      Assessment / Recommendations / Plan   Plan Continue with current plan of care      Progression Toward Goals   Progression toward goals Progressing toward goals              SLP Short Term Goals - 07/11/20 1434      SLP SHORT TERM GOAL #1   Title Pt will recall 3 word finding strategies to assist with finding words.    Time 2    Period Weeks    Status On-going      SLP SHORT TERM GOAL #2   Title Pt will produce single word without phonemic paraphasias given a single model.    Time 2    Period Weeks    Status On-going            SLP Long Term Goals - 07/11/20 1434      SLP LONG TERM GOAL #1   Title Pt will produce <2 phonemic errors in a structured sentence.    Time 6    Period Weeks  Status On-going      SLP LONG TERM GOAL #2   Title Pt will name 4 semantic features of a given object.    Time 6    Period Weeks    Status On-going            Plan - 07/11/20 1433    Clinical Impression Statement Required inhibition of speaking before naming to increase accuracy. Pt demonstrated cognitive fatigue with naming as session went on. Brain breaks rec.    Speech Therapy Frequency 2x / week    Duration 8 weeks    Treatment/Interventions Cueing hierarchy;Functional tasks;Patient/family education;Compensatory strategies;Environmental controls;Multimodal communcation approach;Language facilitation;Compensatory techniques;Cognitive reorganization;SLP instruction and feedback;Internal/external aids    Potential to Achieve Goals Good    Consulted and Agree with Plan of Care Patient           Patient will benefit  from skilled therapeutic intervention in order to improve the following deficits and impairments:   Aphasia    Problem List Patient Active Problem List   Diagnosis Date Noted  . Neuropathic pain   . Dysesthesia   . Prediabetes   . Expressive aphasia   . Atrial fibrillation ([place])   . 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 CVA (cerebral vascular accident) ([place]) 05/30/2020  . Orthostatic hypotension 01/26/2020  . Essential hypertension 01/26/2020  . Acute respiratory failure ([place]) 10/19/2019  . Colon polyp 10/19/2019  . Orthostatic dizziness 10/19/2019  . Hypothyroidism 10/19/2019  . Rapid atrial fibrillation ([place]) 10/12/2019  . Pneumonia 10/12/2019  . Anemia requiring transfusions 10/12/2019  . DNR (do not resuscitate) 10/12/2019  . Acquired thrombophilia ([place]) 10/12/2019  . Iron deficiency anemia 11/24/2017  . Paroxysmal atrial fibrillation ([place]) 11/24/2017  . Chronic depression 11/24/2017  . GERD (gastroesophageal reflux disease) 11/24/2017  . Breast cancer ([place]) 11/24/2017  . Hypothyroid 11/24/2017  . Dyslipidemia 11/24/2017    Verdene Lennert MS, [place], CBIS  07/11/2020, 2:36 PM  [address]. [place], [place], [address] Phone: [phone]   Fax:  [phone]   Name: Katrina Randolph MRN: [id number] Date of Birth: August 17, 1930

## 2020-07-12 ENCOUNTER — Encounter: Payer: Medicare Other | Attending: Registered Nurse | Admitting: Registered Nurse

## 2020-07-12 ENCOUNTER — Encounter: Payer: Self-pay | Admitting: Registered Nurse

## 2020-07-12 VITALS — BP 117/78 | HR 85 | Temp 98.6°F | Ht 62.0 in | Wt 166.4 lb

## 2020-07-12 DIAGNOSIS — I63512 Cerebral infarction due to unspecified occlusion or stenosis of left middle cerebral artery: Secondary | ICD-10-CM | POA: Insufficient documentation

## 2020-07-12 DIAGNOSIS — I4891 Unspecified atrial fibrillation: Secondary | ICD-10-CM | POA: Diagnosis present

## 2020-07-12 DIAGNOSIS — R4701 Aphasia: Secondary | ICD-10-CM | POA: Insufficient documentation

## 2020-07-12 DIAGNOSIS — E785 Hyperlipidemia, unspecified: Secondary | ICD-10-CM | POA: Insufficient documentation

## 2020-07-12 NOTE — Progress Notes (Signed)
Subjective:    Patient ID: Katrina Randolph, female    DOB: [birth year], 87 y.o.   MRN: [id number]  HPI: Katrina Randolph is a 87 y.o. female who  Is here for HFU appointment of her Left Middle Cerebral Artery Stroke, Ex[pressive aphasia, Atrial Fibrillation and Dyslipidemia. She presented to [facility] , she stated she lost her balanced and fell. Also reported Right sided weakness with upper and lower extremity, difficulty with word find and mild confusion. Neurology consulted.  CT Head WO Contrast:  IMPRESSION: 1. No acute intracranial findings. 2. Age related global parenchymal volume loss and chronic ischemic small vessel white matter disease. MR Brain WO Contrast:  IMPRESSION: 1. Left posterior MCA territory acute infarcts with occlusion of multiple left M2 and M2/M3 MCA branches, as described above. Associated edema without substantial mass effect. 2. Moderate chronic microvascular ischemic disease. 3. Frontal predominant cerebral volume loss. 4. Possible small (approximately 2 mm) aneurysm in the region of the right ICA terminus. A CTA could further characterize if clinically indicated Katrina Randolph was initially on aspirin for CVA prophylaxis, she was changed to Eliquis with history of Atrial Fibrillation.   Katrina Randolph was admitted to inpatient Rehabilitation on 06/05/2020 and discharged home on 06/14/2020. She is receiving outpatient therapy at [facility]. She denies any pain at this time. She rated her pain on The Health and History 2. Also reports she has a good appetite.   Daughter in Room  Katrina Randolph refuses to return for F/U appointment, she will F/U with Neurology and PCP, she verbalizes understanding.   Pain Inventory Average Pain 3 Pain Right Now 2 My pain is tingling  LOCATION OF PAIN  Neck shoulder and leg  BOWEL Number of stools per week: 7   BLADDER Normal I   Mobility walk with assistance use a walker how many minutes can you  walk? 10-30 ability to climb steps?  yes do you drive?  no  Function retired  Neuro/Psych No problems in this area  Prior Studies Any changes since last visit?  no  Physicians involved in your care Any changes since last visit?  no   No family history on file. Social History   Socioeconomic History  . Marital status: Married    Spouse name: Not on file  . Number of children: Not on file  . Years of education: Not on file  . Highest education level: Not on file  Occupational History  . Not on file  Tobacco Use  . Smoking status: Former Smoker    Types: Cigarettes  . Smokeless tobacco: Never Used  . Tobacco comment: quit over 20 Years ago  Vaping Use  . Vaping Use: Never used  Substance and Sexual Activity  . Alcohol use: Yes    Comment: occational  . Drug use: Never  . Sexual activity: Not Currently  Other Topics Concern  . Not on file  Social History Narrative   ** Merged History Encounter **       Social Determinants of Health   Financial Resource Strain: Not on file  Food Insecurity: Not on file  Transportation Needs: Not on file  Physical Activity: Not on file  Stress: Not on file  Social Connections: Not on file   Past Surgical History:  Procedure Laterality Date  . ABDOMINAL HYSTERECTOMY    . BIOPSY  10/19/2019   Procedure: BIOPSY;  Surgeon: [name], MD;  [facility];  Service: Gastroenterology;;  . BREAST LUMPECTOMY    .  CHOLECYSTECTOMY    . COLONOSCOPY WITH PROPOFOL N/A 10/19/2019   Procedure: COLONOSCOPY WITH PROPOFOL;  Surgeon: [name], MD;  [facility];  Service: Gastroenterology;  Laterality: N/A;  . ESOPHAGOGASTRODUODENOSCOPY (EGD) WITH PROPOFOL N/A 10/19/2019   Procedure: ESOPHAGOGASTRODUODENOSCOPY (EGD) WITH PROPOFOL;  Surgeon: [name], MD;  [facility];  Service: Gastroenterology;  Laterality: N/A;  . POLYPECTOMY  10/19/2019   Procedure: POLYPECTOMY;  Surgeon: [name],  MD;  [facility];  Service: Gastroenterology;;   Past Medical History:  Diagnosis Date  . Atrial fibrillation ([facility])    BP 117/78   Pulse 85   Temp 98.6 F (37 C)   Ht 5\' 2"  (1.575 m)   Wt 166 lb 6.4 oz (75.5 kg)   SpO2 94%   BMI 30.43 kg/m   Opioid Risk Score:   Fall Risk Score:  `1  Depression screen PHQ 2/9  Depression screen PHQ 2/9 07/12/2020  Decreased Interest 1  Down, Depressed, Hopeless 0  PHQ - 2 Score 1  Altered sleeping 1  Tired, decreased energy 1  Change in appetite 1  Feeling bad or failure about yourself  0  Trouble concentrating 1  Moving slowly or fidgety/restless 0  Suicidal thoughts 0  PHQ-9 Score 5  Difficult doing work/chores Somewhat difficult    Review of Systems  Constitutional: Negative.   HENT: Negative.   Eyes: Negative.   Respiratory: Negative.   Cardiovascular: Positive for leg swelling.       Has had ankle swelling  Gastrointestinal: Negative.   Endocrine: Negative.   Genitourinary: Negative.   Musculoskeletal: Negative.   Skin: Negative.   Allergic/Immunologic: Negative.   Neurological:       Tingling  Hematological: Bruises/bleeds easily.       Eliquis  Psychiatric/Behavioral: Negative.   All other systems reviewed and are negative.      Objective:   Physical Exam Vitals and nursing note reviewed.  Constitutional:      Appearance: Normal appearance.  Cardiovascular:     Rate and Rhythm: Normal rate and regular rhythm.     Pulses: Normal pulses.     Heart sounds: Normal heart sounds.  Pulmonary:     Effort: Pulmonary effort is normal.     Breath sounds: Normal breath sounds.  Musculoskeletal:     Cervical back: Normal range of motion and neck supple.     Comments: Normal Muscle Bulk and Muscle Testing Reveals:  Upper Extremities: Full ROM and Muscle Strength 5/5 Lower Extremities: Full ROM and Muscle Strength 5/5 Right Lower Extremity Flexion Produces Pain into her right lower extremity Left lower  Extremity Flexion Produces Pain into her Left Patella Arises from Table Slowly using walker for support Narrow Based  Gait   Skin:    General: Skin is warm and dry.  Neurological:     General: No focal deficit present.     Mental Status: She is alert and oriented to person, place, and time.  Psychiatric:        Mood and Affect: Mood normal.        Behavior: Behavior normal.           Assessment & Plan:  1.Left Middle Cerebral Artery Stroke,: Ex[pressive aphasia: Continue Outpatient Therapy at [facility]. She has a scheduled appointment with [facility] Neurology. Continue current medication regimen. Continue to monitor.  2. Atrial Fibrillation: Continue current medication regimen. F/U with Cardiology.  3. Dyslipidemia.Continue current medication regimen. PCP Following.   Katrina Randolph refuses F/U appointment

## 2020-07-13 ENCOUNTER — Ambulatory Visit: Payer: Medicare Other | Admitting: Speech Pathology

## 2020-07-13 ENCOUNTER — Ambulatory Visit: Payer: Medicare Other

## 2020-07-18 ENCOUNTER — Other Ambulatory Visit: Payer: Self-pay

## 2020-07-18 ENCOUNTER — Ambulatory Visit: Payer: Medicare Other | Admitting: Physical Therapy

## 2020-07-18 ENCOUNTER — Encounter: Payer: Self-pay | Admitting: Speech Pathology

## 2020-07-18 ENCOUNTER — Ambulatory Visit: Payer: Medicare Other | Attending: Physical Medicine & Rehabilitation | Admitting: Speech Pathology

## 2020-07-18 ENCOUNTER — Encounter: Payer: Self-pay | Admitting: Physical Therapy

## 2020-07-18 DIAGNOSIS — M6281 Muscle weakness (generalized): Secondary | ICD-10-CM | POA: Insufficient documentation

## 2020-07-18 DIAGNOSIS — I63512 Cerebral infarction due to unspecified occlusion or stenosis of left middle cerebral artery: Secondary | ICD-10-CM

## 2020-07-18 DIAGNOSIS — R252 Cramp and spasm: Secondary | ICD-10-CM | POA: Insufficient documentation

## 2020-07-18 DIAGNOSIS — R2681 Unsteadiness on feet: Secondary | ICD-10-CM | POA: Diagnosis present

## 2020-07-18 DIAGNOSIS — R262 Difficulty in walking, not elsewhere classified: Secondary | ICD-10-CM | POA: Diagnosis present

## 2020-07-18 DIAGNOSIS — R4701 Aphasia: Secondary | ICD-10-CM | POA: Diagnosis not present

## 2020-07-18 NOTE — Therapy (Signed)
[place]. [place], [place], [address] Phone: [phone]   Fax:  [phone]  Speech Language Pathology Treatment  Patient Details  Name: Katrina Randolph MRN: [id number] Date of Birth: Dec 09, 1930 Referring Provider (SLP): Jamse Arn MD   Encounter Date: 07/18/2020   End of Session - 07/18/20 1405    Visit Number 6    Number of Visits 17    Date for SLP Re-Evaluation 08/19/20    SLP Start Time 53    SLP Stop Time  1440    SLP Time Calculation (min) 40 min    Activity Tolerance Patient tolerated treatment well           Past Medical History:  Diagnosis Date  . Atrial fibrillation [facility])     Past Surgical History:  Procedure Laterality Date  . ABDOMINAL HYSTERECTOMY    . BIOPSY  10/19/2019   Procedure: BIOPSY;  Surgeon: [name], MD;  [facility];  Service: Gastroenterology;;  . BREAST LUMPECTOMY    . CHOLECYSTECTOMY    . COLONOSCOPY WITH PROPOFOL N/A 10/19/2019   Procedure: COLONOSCOPY WITH PROPOFOL;  Surgeon: [name], MD;  [facility];  Service: Gastroenterology;  Laterality: N/A;  . ESOPHAGOGASTRODUODENOSCOPY (EGD) WITH PROPOFOL N/A 10/19/2019   Procedure: ESOPHAGOGASTRODUODENOSCOPY (EGD) WITH PROPOFOL;  Surgeon: [name], MD;  [facility];  Service: Gastroenterology;  Laterality: N/A;  . POLYPECTOMY  10/19/2019   Procedure: POLYPECTOMY;  Surgeon: [name], MD;  [facility];  Service: Gastroenterology;;    There were no vitals filed for this visit.   Subjective Assessment - 07/18/20 1404    Subjective Pt reports she had a stomach virus last week and still does not have any energy.    Currently in Pain? No/denies                 ADULT SLP TREATMENT - 07/18/20 1441      General Information   Behavior/Cognition Alert;Pleasant mood      Treatment Provided   Treatment provided Cognitive-Linquistic       Cognitive-Linquistic Treatment   Treatment focused on Aphasia;Apraxia    Skilled Treatment Phonomotor treatment with sound-letter correspondance (t/d, s/z, k/g). Pt required maxA and benefited from "fun letter names" ex: tick tock sound /t/.      Assessment / Recommendations / Plan   Plan Continue with current plan of care      Progression Toward Goals   Progression toward goals Progressing toward goals              SLP Short Term Goals - 07/18/20 1405      SLP SHORT TERM GOAL #1   Title Pt will recall 3 word finding strategies to assist with finding words.    Time 2    Period Weeks    Status On-going      SLP SHORT TERM GOAL #2   Title Pt will produce single word without phonemic paraphasias given a single model.    Time 2    Period Weeks    Status On-going            SLP Long Term Goals - 07/18/20 1405      SLP LONG TERM GOAL #1   Title Pt will produce <2 phonemic errors in a structured sentence.    Time 6    Period Weeks    Status On-going      SLP LONG TERM GOAL #2   Title  Pt will name 4 semantic features of a given object.    Time 6    Period Weeks    Status On-going            Plan - 07/18/20 1443    Clinical Impression Statement Required inhibition of speaking before naming to increase accuracy. MaxA required with letter-sound correspondance.    Speech Therapy Frequency 2x / week    Duration 8 weeks    Treatment/Interventions Cueing hierarchy;Functional tasks;Patient/family education;Compensatory strategies;Environmental controls;Multimodal communcation approach;Language facilitation;Compensatory techniques;Cognitive reorganization;SLP instruction and feedback;Internal/external aids    Potential to Achieve Goals Good    Consulted and Agree with Plan of Care Patient           Patient will benefit from skilled therapeutic intervention in order to improve the following deficits and impairments:   Aphasia    Problem List Patient Active Problem  List   Diagnosis Date Noted  . Neuropathic pain   . Dysesthesia   . Prediabetes   . Expressive aphasia   . Atrial fibrillation ([place])   . 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 CVA (cerebral vascular accident) ([place]) 05/30/2020  . Orthostatic hypotension 01/26/2020  . Essential hypertension 01/26/2020  . Acute respiratory failure ([place]) 10/19/2019  . Colon polyp 10/19/2019  . Orthostatic dizziness 10/19/2019  . Hypothyroidism 10/19/2019  . Rapid atrial fibrillation ([place]) 10/12/2019  . Pneumonia 10/12/2019  . Anemia requiring transfusions 10/12/2019  . DNR (do not resuscitate) 10/12/2019  . Acquired thrombophilia ([place]) 10/12/2019  . Iron deficiency anemia 11/24/2017  . Paroxysmal atrial fibrillation ([place]) 11/24/2017  . Chronic depression 11/24/2017  . GERD (gastroesophageal reflux disease) 11/24/2017  . Breast cancer ([place]) 11/24/2017  . Hypothyroid 11/24/2017  . Dyslipidemia 11/24/2017    Verdene Lennert MS, [place], CBIS  07/18/2020, 2:43 PM  [place]. [place], [place], [address] Phone: [phone]   Fax:  [phone]   Name: Lachae Hohler MRN: [id number] Date of Birth: 23-Apr-1930

## 2020-07-18 NOTE — Therapy (Signed)
[place]. [place], [place], [address] Phone: [phone]   Fax:  [phone]  Physical Therapy Treatment  Patient Details  Name: Katrina Randolph MRN: [id number] Date of Birth: Jul 23, 1930 Referring Provider (PT): Posey Pronto   Encounter Date: 07/18/2020   PT End of Session - 07/18/20 1527    Visit Number 6    Number of Visits 17    Authorization Type UHC MCR- medical necessity    PT Start Time 6270    PT Stop Time 3500    PT Time Calculation (min) 42 min    Activity Tolerance Patient limited by fatigue    Behavior During Therapy [facility] for tasks assessed/performed           Past Medical History:  Diagnosis Date  . Atrial fibrillation [facility])     Past Surgical History:  Procedure Laterality Date  . ABDOMINAL HYSTERECTOMY    . BIOPSY  10/19/2019   Procedure: BIOPSY;  Surgeon: [name], MD;  [facility];  Service: Gastroenterology;;  . BREAST LUMPECTOMY    . CHOLECYSTECTOMY    . COLONOSCOPY WITH PROPOFOL N/A 10/19/2019   Procedure: COLONOSCOPY WITH PROPOFOL;  Surgeon: [name], MD;  [facility];  Service: Gastroenterology;  Laterality: N/A;  . ESOPHAGOGASTRODUODENOSCOPY (EGD) WITH PROPOFOL N/A 10/19/2019   Procedure: ESOPHAGOGASTRODUODENOSCOPY (EGD) WITH PROPOFOL;  Surgeon: [name], MD;  [facility];  Service: Gastroenterology;  Laterality: N/A;  . POLYPECTOMY  10/19/2019   Procedure: POLYPECTOMY;  Surgeon: [name], MD;  [facility];  Service: Gastroenterology;;    There were no vitals filed for this visit.   Subjective Assessment - 07/18/20 1449    Subjective Patient reports that she came down with a virus last week and has been very weak and did not do much.  She reports that she is over the virus but still very weak and tired    Currently in Pain? No/denies              [facility] PT Assessment - 07/18/20 0001       Ambulation/Gait   Gait Comments gait without device in the clinic 110 feet, SOB,  Then walked from the gym outside around the portico and into the gym 125' 2 x veered to the left and had to stop and rest twice      Berg Balance Test   Sit to Stand Able to stand without using hands and stabilize independently    Standing Unsupported Able to stand safely 2 minutes    Sitting with Back Unsupported but Feet Supported on Floor or Stool Able to sit safely and securely 2 minutes    Stand to Sit Controls descent by using hands    Transfers Able to transfer safely, definite need of hands    Standing Unsupported with Eyes Closed Able to stand 10 seconds safely    Standing Unsupported with Feet Together Able to place feet together independently but unable to hold for 30 seconds    From Standing, Reach Forward with Outstretched Arm Can reach forward >12 cm safely (5")    From Standing Position, Pick up Object from Floor Able to pick up shoe safely and easily    From Standing Position, Turn to Look Behind Over each Shoulder Looks behind one side only/other side shows less weight shift    Turn 360 Degrees Able to turn 360 degrees safely one side only in 4 seconds or less    Standing Unsupported,  Alternately Place Feet on Step/Stool Able to stand independently and complete 8 steps >20 seconds    Standing Unsupported, One Foot in Front Able to take small step independently and hold 30 seconds    Standing on One Leg Tries to lift leg/unable to hold 3 seconds but remains standing independently    Total Score 43      Timed Up and Go Test   Normal TUG (seconds) 18    TUG Comments no device                         [facility] Adult PT Treatment/Exercise - 07/18/20 0001      High Level Balance   High Level Balance Comments standing ball toss, standing on airex ball toss      Knee/Hip Exercises: Aerobic   Nustep level 4 x 6 minutes rested at 4 minutes                    PT Short Term  Goals - 06/19/20 1116      PT SHORT TERM GOAL #1   Title Independent with initial HEP    Time 2    Period Weeks    Status New    Target Date 07/03/20             PT Long Term Goals - 07/18/20 1535      PT LONG TERM GOAL #1   Title Independent with advanced HEP    Status On-going      PT LONG TERM GOAL #2   Title Pt will be able to complete 5TSTS and TUG independent and without LOB in </= 12 seconds to demonstrate improved functional strength, gait speed, and balance.    Status On-going      PT LONG TERM GOAL #3   Title Pt will score at least 46/56 on the berg balance scale to demonstrate decreased falls risk    Status Partially Met      PT LONG TERM GOAL #5   Title Pt will be able to ambulate indoor and outdoor uneven surfaces and negotiate obstacles with LRAD and no LOB to facilitate safety within home and community environments.    Status On-going                 Plan - 07/18/20 1527    Clinical Impression Statement Patient reports a set back with having a virus last week.  She is definitely more short of breath, having some more time to rest and catch her breath, she also seemed to have a little more frustration today and more times that she had difficulty finding words.  She did improve her TUG time, and I did perfrom Berg balance test, both of these tests still put her for an increased risk for falls.    PT Next Visit Plan work on her balance and functional safety    Consulted and Agree with Plan of Care Patient           Patient will benefit from skilled therapeutic intervention in order to improve the following deficits and impairments:  Abnormal gait,Decreased endurance,Pain,Decreased balance,Decreased mobility,Decreased strength,Impaired sensation  Visit Diagnosis: Unsteadiness on feet  Muscle weakness (generalized)  Difficulty in walking, not elsewhere classified  Left middle cerebral artery stroke ([place])  Cramp and spasm     Problem  List Patient Active Problem List   Diagnosis Date Noted  . Neuropathic pain   . Dysesthesia   .  Prediabetes   . Expressive aphasia   . Atrial fibrillation ([place])   . 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 CVA (cerebral vascular accident) ([place]) 05/30/2020  . Orthostatic hypotension 01/26/2020  . Essential hypertension 01/26/2020  . Acute respiratory failure ([place]) 10/19/2019  . Colon polyp 10/19/2019  . Orthostatic dizziness 10/19/2019  . Hypothyroidism 10/19/2019  . Rapid atrial fibrillation ([place]) 10/12/2019  . Pneumonia 10/12/2019  . Anemia requiring transfusions 10/12/2019  . DNR (do not resuscitate) 10/12/2019  . Acquired thrombophilia ([place]) 10/12/2019  . Iron deficiency anemia 11/24/2017  . Paroxysmal atrial fibrillation ([place]) 11/24/2017  . Chronic depression 11/24/2017  . GERD (gastroesophageal reflux disease) 11/24/2017  . Breast cancer ([place]) 11/24/2017  . Hypothyroid 11/24/2017  . Dyslipidemia 11/24/2017    Sumner Boast., PT 07/18/2020, 3:36 PM  [place]. [place], [place], [address] Phone: [phone]   Fax:  [phone]  Name: Ethelmae Ringel MRN: [id number] Date of Birth: 26-Mar-1930

## 2020-07-20 ENCOUNTER — Encounter: Payer: Self-pay | Admitting: Physical Therapy

## 2020-07-20 ENCOUNTER — Ambulatory Visit: Payer: Medicare Other | Admitting: Speech Pathology

## 2020-07-20 ENCOUNTER — Other Ambulatory Visit: Payer: Self-pay

## 2020-07-20 ENCOUNTER — Ambulatory Visit: Payer: Medicare Other | Admitting: Physical Therapy

## 2020-07-20 ENCOUNTER — Encounter: Payer: Self-pay | Admitting: Speech Pathology

## 2020-07-20 DIAGNOSIS — R4701 Aphasia: Secondary | ICD-10-CM

## 2020-07-20 DIAGNOSIS — M6281 Muscle weakness (generalized): Secondary | ICD-10-CM

## 2020-07-20 DIAGNOSIS — I63512 Cerebral infarction due to unspecified occlusion or stenosis of left middle cerebral artery: Secondary | ICD-10-CM

## 2020-07-20 DIAGNOSIS — R262 Difficulty in walking, not elsewhere classified: Secondary | ICD-10-CM

## 2020-07-20 DIAGNOSIS — R2681 Unsteadiness on feet: Secondary | ICD-10-CM

## 2020-07-20 NOTE — Therapy (Signed)
[place]. [place], [place], [address] Phone: [phone]   Fax:  [phone]  Physical Therapy Treatment  Patient Details  Name: Katrina Randolph MRN: [id number] Date of Birth: [birth year] Referring Provider (PT): Posey Pronto   Encounter Date: 07/20/2020   PT End of Session - 07/20/20 1522     Visit Number 7    Number of Visits 17    Date for PT Re-Evaluation 08/21/20    Authorization Type UHC MCR- medical necessity    PT Start Time 6283    PT Stop Time 1525    PT Time Calculation (min) 40 min    Activity Tolerance Patient tolerated treatment well    Behavior During Therapy [facility] for tasks assessed/performed             Past Medical History:  Diagnosis Date   Atrial fibrillation [facility])     Past Surgical History:  Procedure Laterality Date   ABDOMINAL HYSTERECTOMY     BIOPSY  10/19/2019   Procedure: BIOPSY;  Surgeon: [name], MD;  [facility];  Service: Gastroenterology;;   BREAST LUMPECTOMY     CHOLECYSTECTOMY     COLONOSCOPY WITH PROPOFOL N/A 10/19/2019   Procedure: COLONOSCOPY WITH PROPOFOL;  Surgeon: [name], MD;  [facility];  Service: Gastroenterology;  Laterality: N/A;   ESOPHAGOGASTRODUODENOSCOPY (EGD) WITH PROPOFOL N/A 10/19/2019   Procedure: ESOPHAGOGASTRODUODENOSCOPY (EGD) WITH PROPOFOL;  Surgeon: [name], MD;  [facility];  Service: Gastroenterology;  Laterality: N/A;   POLYPECTOMY  10/19/2019   Procedure: POLYPECTOMY;  Surgeon: [name], MD;  [facility];  Service: Gastroenterology;;    There were no vitals filed for this visit.   Subjective Assessment - 07/20/20 1453     Subjective No falls, just short of breath    Currently in Pain? No/denies                               [facility] Adult PT Treatment/Exercise - 07/20/20 0001       Ambulation/Gait   Gait Comments outside small slope, curbs  and grass with SPC and CGA, some gait with SPC and supervision in the clinc about 100'      High Level Balance   High Level Balance Activities Side stepping;Backward walking      Knee/Hip Exercises: Aerobic   Nustep level 5 x 6 minutes      Knee/Hip Exercises: Machines for Strengthening   Cybex Knee Extension 5# 2x10    Cybex Knee Flexion 15# 2x10      Knee/Hip Exercises: Standing   Hip Abduction Both;2 sets;10 reps    Hip Extension Both;2 sets;10 reps                      PT Short Term Goals - 06/19/20 1116       PT SHORT TERM GOAL #1   Title Independent with initial HEP    Time 2    Period Weeks    Status New    Target Date 07/03/20               PT Long Term Goals - 07/20/20 1524       PT LONG TERM GOAL #1   Title Independent with advanced HEP      PT LONG TERM GOAL #2   Title Pt will be able to complete 5TSTS and TUG independent and without  LOB in </= 12 seconds to demonstrate improved functional strength, gait speed, and balance.    Status On-going                   Plan - 07/20/20 1523     Clinical Impression Statement I added some machines for strength and she did well, we did some walking outside and inside with a SPC.  needs CGA.  Had some left knee pain when she twisted to sit.  biggest issue is SOB with exertion.    PT Next Visit Plan work on her balance and functional safety    Consulted and Agree with Plan of Care Patient             Patient will benefit from skilled therapeutic intervention in order to improve the following deficits and impairments:  Abnormal gait, Decreased endurance, Pain, Decreased balance, Decreased mobility, Decreased strength, Impaired sensation  Visit Diagnosis: Unsteadiness on feet  Muscle weakness (generalized)  Difficulty in walking, not elsewhere classified  Left middle cerebral artery stroke [facility])     Problem List Patient Active Problem List   Diagnosis Date Noted   Neuropathic  pain    Dysesthesia    Prediabetes    Expressive aphasia    Atrial fibrillation ([place])    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   CVA (cerebral vascular accident) ([place]) 05/30/2020   Orthostatic hypotension 01/26/2020   Essential hypertension 01/26/2020   Acute respiratory failure ([place]) 10/19/2019   Colon polyp 10/19/2019   Orthostatic dizziness 10/19/2019   Hypothyroidism 10/19/2019   Rapid atrial fibrillation ([place]) 10/12/2019   Pneumonia 10/12/2019   Anemia requiring transfusions 10/12/2019   DNR (do not resuscitate) 10/12/2019   Acquired thrombophilia ([place]) 10/12/2019   Iron deficiency anemia 11/24/2017   Paroxysmal atrial fibrillation ([place]) 11/24/2017   Chronic depression 11/24/2017   GERD (gastroesophageal reflux disease) 11/24/2017   Breast cancer ([place]) 11/24/2017   Hypothyroid 11/24/2017   Dyslipidemia 11/24/2017    Sumner Boast., PT 07/20/2020, 3:25 PM  [place]. [place], [place], [address] Phone: [phone]   Fax:  [phone]  Name: Katrina Randolph MRN: [id number] Date of Birth: 02/16/1930

## 2020-07-20 NOTE — Therapy (Signed)
[place]. [place], [place], [address] Phone: [phone]   Fax:  [phone]  Speech Language Pathology Treatment  Patient Details  Name: Katrina Randolph MRN: [id number] Date of Birth: Jun 21, 1930 Referring Provider (SLP): Jamse Arn MD   Encounter Date: 07/20/2020   End of Session - 07/20/20 1432     Visit Number 7    Number of Visits 17    Date for SLP Re-Evaluation 08/19/20    SLP Start Time 1400    SLP Stop Time  1443    SLP Time Calculation (min) 43 min    Activity Tolerance Patient tolerated treatment well             Past Medical History:  Diagnosis Date   Atrial fibrillation [facility])     Past Surgical History:  Procedure Laterality Date   ABDOMINAL HYSTERECTOMY     BIOPSY  10/19/2019   Procedure: BIOPSY;  Surgeon: [name], MD;  [facility];  Service: Gastroenterology;;   BREAST LUMPECTOMY     CHOLECYSTECTOMY     COLONOSCOPY WITH PROPOFOL N/A 10/19/2019   Procedure: COLONOSCOPY WITH PROPOFOL;  Surgeon: [name], MD;  [facility];  Service: Gastroenterology;  Laterality: N/A;   ESOPHAGOGASTRODUODENOSCOPY (EGD) WITH PROPOFOL N/A 10/19/2019   Procedure: ESOPHAGOGASTRODUODENOSCOPY (EGD) WITH PROPOFOL;  Surgeon: [name], MD;  [facility];  Service: Gastroenterology;  Laterality: N/A;   POLYPECTOMY  10/19/2019   Procedure: POLYPECTOMY;  Surgeon: [name], MD;  [facility];  Service: Gastroenterology;;    There were no vitals filed for this visit.   Subjective Assessment - 07/20/20 1403     Subjective Pt reports she is still feeling a little under the weather since her stomach virus last week.    Currently in Pain? Yes    Pain Score 5     Pain Location Head    Pain Descriptors / Indicators Aching    Pain Onset Today    Pain Frequency Intermittent    Pain Relieving Factors none                    ADULT SLP TREATMENT - 07/20/20 1438       General Information   Behavior/Cognition Alert;Pleasant mood      Treatment Provided   Treatment provided Cognitive-Linquistic      Cognitive-Linquistic Treatment   Treatment focused on Aphasia;Apraxia    Skilled Treatment Phonomotor treatment with sound-letter correspondance (t/d, s/z, k/g). Pt required modA with single phonemes. Transitioned to 3 letter words - pt was able to describe 9/10 words independently.      Assessment / Recommendations / Plan   Plan Continue with current plan of care      Progression Toward Goals   Progression toward goals Progressing toward goals                SLP Short Term Goals - 07/20/20 1434       SLP SHORT TERM GOAL #1   Title Pt will recall 3 word finding strategies to assist with finding words.    Time 1    Period Weeks    Status On-going      SLP SHORT TERM GOAL #2   Title Pt will produce single word without phonemic paraphasias given a single model.    Time 1    Period Weeks    Status On-going  SLP Long Term Goals - 07/20/20 1434       SLP LONG TERM GOAL #1   Title Pt will produce <2 phonemic errors in a structured sentence.    Time 5    Period Weeks    Status On-going      SLP LONG TERM GOAL #2   Title Pt will name 4 semantic features of a given object.    Time 5    Period Weeks    Status On-going              Plan - 07/20/20 1433     Clinical Impression Statement Trained patient in phonomotor treatment. Pt required modA for letter-sound correspondance - making improvement from previous sessions.    Speech Therapy Frequency 2x / week    Duration 8 weeks    Treatment/Interventions Cueing hierarchy;Functional tasks;Patient/family education;Compensatory strategies;Environmental controls;Multimodal communcation approach;Language facilitation;Compensatory techniques;Cognitive reorganization;SLP instruction and feedback;Internal/external aids    Potential  to Achieve Goals Good    Consulted and Agree with Plan of Care Patient             Patient will benefit from skilled therapeutic intervention in order to improve the following deficits and impairments:   Aphasia    Problem List Patient Active Problem List   Diagnosis Date Noted   Neuropathic pain    Dysesthesia    Prediabetes    Expressive aphasia    Atrial fibrillation ([place])    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   CVA (cerebral vascular accident) ([place]) 05/30/2020   Orthostatic hypotension 01/26/2020   Essential hypertension 01/26/2020   Acute respiratory failure ([place]) 10/19/2019   Colon polyp 10/19/2019   Orthostatic dizziness 10/19/2019   Hypothyroidism 10/19/2019   Rapid atrial fibrillation ([place]) 10/12/2019   Pneumonia 10/12/2019   Anemia requiring transfusions 10/12/2019   DNR (do not resuscitate) 10/12/2019   Acquired thrombophilia ([place]) 10/12/2019   Iron deficiency anemia 11/24/2017   Paroxysmal atrial fibrillation ([place]) 11/24/2017   Chronic depression 11/24/2017   GERD (gastroesophageal reflux disease) 11/24/2017   Breast cancer ([place]) 11/24/2017   Hypothyroid 11/24/2017   Dyslipidemia 11/24/2017    Rosann Auerbach [place] MS, [place], CBIS   07/20/2020, 2:41 PM  [place]. [place], [place], [address] Phone: [phone]   Fax:  [phone]   Name: Katrina Randolph MRN: [id number] Date of Birth: 09/24/1930

## 2020-07-24 ENCOUNTER — Ambulatory Visit: Payer: Medicare Other

## 2020-07-24 ENCOUNTER — Other Ambulatory Visit: Payer: Self-pay

## 2020-07-24 ENCOUNTER — Encounter: Payer: Self-pay | Admitting: Speech Pathology

## 2020-07-24 ENCOUNTER — Ambulatory Visit: Payer: Medicare Other | Admitting: Speech Pathology

## 2020-07-24 DIAGNOSIS — R262 Difficulty in walking, not elsewhere classified: Secondary | ICD-10-CM

## 2020-07-24 DIAGNOSIS — I63512 Cerebral infarction due to unspecified occlusion or stenosis of left middle cerebral artery: Secondary | ICD-10-CM

## 2020-07-24 DIAGNOSIS — R252 Cramp and spasm: Secondary | ICD-10-CM

## 2020-07-24 DIAGNOSIS — R2681 Unsteadiness on feet: Secondary | ICD-10-CM

## 2020-07-24 DIAGNOSIS — R4701 Aphasia: Secondary | ICD-10-CM

## 2020-07-24 DIAGNOSIS — M6281 Muscle weakness (generalized): Secondary | ICD-10-CM

## 2020-07-24 NOTE — Therapy (Signed)
[place]. [place], [place], [address] Phone: [phone]   Fax:  [phone]  Speech Language Pathology Treatment  Patient Details  Name: Katrina Randolph MRN: [id number] Date of Birth: [birth year] Referring Provider (SLP): Jamse Arn MD   Encounter Date: 07/24/2020   End of Session - 07/24/20 1419     Visit Number 8    Number of Visits 17    Date for SLP Re-Evaluation 08/19/20    SLP Start Time 1345    SLP Stop Time  1430    SLP Time Calculation (min) 45 min    Activity Tolerance Patient tolerated treatment well             Past Medical History:  Diagnosis Date   Atrial fibrillation [facility])     Past Surgical History:  Procedure Laterality Date   ABDOMINAL HYSTERECTOMY     BIOPSY  10/19/2019   Procedure: BIOPSY;  Surgeon: [name], MD;  Location: [facility];  Service: Gastroenterology;;   BREAST LUMPECTOMY     CHOLECYSTECTOMY     COLONOSCOPY WITH PROPOFOL N/A 10/19/2019   Procedure: COLONOSCOPY WITH PROPOFOL;  Surgeon: [name], MD;  Location: [facility];  Service: Gastroenterology;  Laterality: N/A;   ESOPHAGOGASTRODUODENOSCOPY (EGD) WITH PROPOFOL N/A 10/19/2019   Procedure: ESOPHAGOGASTRODUODENOSCOPY (EGD) WITH PROPOFOL;  Surgeon: [name], MD;  [facility]: [facility];  Service: Gastroenterology;  Laterality: N/A;   POLYPECTOMY  10/19/2019   Procedure: POLYPECTOMY;  Surgeon: [name], MD;  [facility]: [facility];  Service: Gastroenterology;;    There were no vitals filed for this visit.   Subjective Assessment - 07/24/20 1346     Subjective Pt reports her heart is feeling "fluttery" today because of her afib. She reports that is why she feels a little "sluggish" today.    Currently in Pain? No/denies    Pain Score 0-No pain                   ADULT SLP TREATMENT - 07/24/20 1348       General Information   Behavior/Cognition  Alert;Pleasant mood      Treatment Provided   Treatment provided Cognitive-Linquistic      Cognitive-Linquistic Treatment   Treatment focused on Aphasia;Apraxia    Skilled Treatment Phonomotor treatment with sound-letter correspondance. Pt has more difficulty producing the voiced sounds. Pt required modA with single phonemes. Pt was given a 10-letter word and asked to come up with as many small words as she could out of the orginal word. Pt required min-to-mod A.      Assessment / Recommendations / Plan   Plan Continue with current plan of care      Progression Toward Goals   Progression toward goals Progressing toward goals                SLP Short Term Goals - 07/24/20 1424       SLP SHORT TERM GOAL #1   Title Pt will recall 3 word finding strategies to assist with finding words.    Time 1    Period Weeks    Status On-going      SLP SHORT TERM GOAL #2   Title Pt will produce single word without phonemic paraphasias given a single model.    Time 1    Period Weeks    Status On-going              SLP Long Term Goals - 07/24/20 1424  SLP LONG TERM GOAL #1   Title Pt will produce <2 phonemic errors in simple sentence (<4 words).    Time 5    Period Weeks    Status Revised      SLP LONG TERM GOAL #2   Title Pt will name 4 semantic features of a given object.    Time 5    Period Weeks    Status On-going              Plan - 07/24/20 1423     Clinical Impression Statement Trained patient in phonomotor treatment. Pt required modA for letter-sound correspondance - making improvement from previous sessions.    Speech Therapy Frequency 2x / week    Duration 8 weeks    Treatment/Interventions Cueing hierarchy;Functional tasks;Patient/family education;Compensatory strategies;Environmental controls;Multimodal communcation approach;Language facilitation;Compensatory techniques;Cognitive reorganization;SLP instruction and feedback;Internal/external aids     Potential to Achieve Goals Good    Consulted and Agree with Plan of Care Patient             Patient will benefit from skilled therapeutic intervention in order to improve the following deficits and impairments:   Aphasia    Problem List Patient Active Problem List   Diagnosis Date Noted   Neuropathic pain    Dysesthesia    Prediabetes    Expressive aphasia    Atrial fibrillation ([place])    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   CVA (cerebral vascular accident) ([place]) 05/30/2020   Orthostatic hypotension 01/26/2020   Essential hypertension 01/26/2020   Acute respiratory failure ([place]) 10/19/2019   Colon polyp 10/19/2019   Orthostatic dizziness 10/19/2019   Hypothyroidism 10/19/2019   Rapid atrial fibrillation ([place]) 10/12/2019   Pneumonia 10/12/2019   Anemia requiring transfusions 10/12/2019   DNR (do not resuscitate) 10/12/2019   Acquired thrombophilia ([place]) 10/12/2019   Iron deficiency anemia 11/24/2017   Paroxysmal atrial fibrillation ([place]) 11/24/2017   Chronic depression 11/24/2017   GERD (gastroesophageal reflux disease) 11/24/2017   Breast cancer ([place]) 11/24/2017   Hypothyroid 11/24/2017   Dyslipidemia 11/24/2017    Rosann Auerbach Tenstrike MS, [place], CBIS  07/24/2020, 2:25 PM  [place]. [place], [place], [address] Phone: [phone]   Fax:  [phone]   Name: Kemaria Dedic MRN: [id number] Date of Birth: [birth year]

## 2020-07-24 NOTE — Therapy (Signed)
[place]. [place], [place], [address] Phone: [phone]   Fax:  [phone]  Physical Therapy Treatment  Patient Details  Name: Katrina Randolph MRN: [id number] Date of Birth: 1930-11-03 Referring Provider (PT): Posey Pronto   Encounter Date: 07/24/2020   PT End of Session - 07/24/20 1309     Visit Number 8    Number of Visits 17    Date for PT Re-Evaluation 08/21/20    Authorization Type [employer] MCR- medical necessity    PT Start Time 1300    PT Stop Time 1340    PT Time Calculation (min) 40 min    Activity Tolerance Patient tolerated treatment well    Behavior During Therapy [facility] for tasks assessed/performed             Past Medical History:  Diagnosis Date   Atrial fibrillation [facility])     Past Surgical History:  Procedure Laterality Date   ABDOMINAL HYSTERECTOMY     BIOPSY  10/19/2019   Procedure: BIOPSY;  Surgeon: [name], MD;  [facility];  Service: Gastroenterology;;   BREAST LUMPECTOMY     CHOLECYSTECTOMY     COLONOSCOPY WITH PROPOFOL N/A 10/19/2019   Procedure: COLONOSCOPY WITH PROPOFOL;  Surgeon: [name], MD;  [facility];  Service: Gastroenterology;  Laterality: N/A;   ESOPHAGOGASTRODUODENOSCOPY (EGD) WITH PROPOFOL N/A 10/19/2019   Procedure: ESOPHAGOGASTRODUODENOSCOPY (EGD) WITH PROPOFOL;  Surgeon: [name], MD;  [facility];  Service: Gastroenterology;  Laterality: N/A;   POLYPECTOMY  10/19/2019   Procedure: POLYPECTOMY;  Surgeon: [name], MD;  [facility];  Service: Gastroenterology;;    There were no vitals filed for this visit.   Subjective Assessment - 07/24/20 1303     Subjective Tired, seeing doctor tomorrow.    Currently in Pain? No/denies                               [facility] Adult PT Treatment/Exercise - 07/24/20 0001       High Level Balance   High Level Balance Activities Side  stepping;Backward walking;Turns   4 laps side stepping- required seated rest post     Therapeutic Activites    Therapeutic Activities Other Therapeutic Activities    Other Therapeutic Activities Pursed lip breathing, active recovery in sitting to manage fatigue/SOB. Therapeutic rest breaks.      Knee/Hip Exercises: Aerobic   Nustep level 2 x 6 min      Knee/Hip Exercises: Standing   Hip Abduction Both;2 sets;10 reps    Hip Extension Both;2 sets;10 reps      Knee/Hip Exercises: Seated   Long Arc Quad 10 reps;Right;Left    Marching Both;10 reps                      PT Short Term Goals - 06/19/20 1116       PT SHORT TERM GOAL #1   Title Independent with initial HEP    Time 2    Period Weeks    Status New    Target Date 07/03/20               PT Long Term Goals - 07/20/20 1524       PT LONG TERM GOAL #1   Title Independent with advanced HEP      PT LONG TERM GOAL #2   Title Pt will be able to complete  5TSTS and TUG independent and without LOB in </= 12 seconds to demonstrate improved functional strength, gait speed, and balance.    Status On-going                   Plan - 07/24/20 1309     Clinical Impression Statement Biggest limiting factor continues to be fatigue and SOB on exertion requiring therapeutic rest breaks. Emphasis of todays actiivties on functional strength balance . Educated patient in pursed lip breathing  to manage SOB/fatigue with some improvement noted. Continue to monitor SOB/fatigue with all exercises/activities    PT Next Visit Plan work on her balance and functional safety    Consulted and Agree with Plan of Care Patient             Patient will benefit from skilled therapeutic intervention in order to improve the following deficits and impairments:  Abnormal gait, Decreased endurance, Pain, Decreased balance, Decreased mobility, Decreased strength, Impaired sensation  Visit Diagnosis: Unsteadiness on  feet  Muscle weakness (generalized)  Left middle cerebral artery stroke (HCC)  Difficulty in walking, not elsewhere classified  Cramp and spasm     Problem List Patient Active Problem List   Diagnosis Date Noted   Neuropathic pain    Dysesthesia    Prediabetes    Expressive aphasia    Atrial fibrillation ([place])    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   CVA (cerebral vascular accident) ([place]) 05/30/2020   Orthostatic hypotension 01/26/2020   Essential hypertension 01/26/2020   Acute respiratory failure ([place]) 10/19/2019   Colon polyp 10/19/2019   Orthostatic dizziness 10/19/2019   Hypothyroidism 10/19/2019   Rapid atrial fibrillation ([place]) 10/12/2019   Pneumonia 10/12/2019   Anemia requiring transfusions 10/12/2019   DNR (do not resuscitate) 10/12/2019   Acquired thrombophilia ([place]) 10/12/2019   Iron deficiency anemia 11/24/2017   Paroxysmal atrial fibrillation ([place]) 11/24/2017   Chronic depression 11/24/2017   GERD (gastroesophageal reflux disease) 11/24/2017   Breast cancer ([place]) 11/24/2017   Hypothyroid 11/24/2017   Dyslipidemia 11/24/2017    Hall Busing, PT, DPT 07/24/2020, 1:46 PM  [place]. [place], [place], [address] Phone: [phone]   Fax:  [phone]  Name: Katrina Randolph MRN: [id number] Date of Birth: 05-25-1930

## 2020-07-25 ENCOUNTER — Inpatient Hospital Stay: Payer: Medicare Other | Admitting: Adult Health

## 2020-07-25 ENCOUNTER — Encounter: Payer: Self-pay | Admitting: Neurology

## 2020-07-25 ENCOUNTER — Ambulatory Visit: Payer: Medicare Other | Admitting: Neurology

## 2020-07-25 VITALS — BP 118/70 | HR 76 | Ht 63.0 in | Wt 169.0 lb

## 2020-07-25 DIAGNOSIS — G44209 Tension-type headache, unspecified, not intractable: Secondary | ICD-10-CM | POA: Diagnosis not present

## 2020-07-25 DIAGNOSIS — I482 Chronic atrial fibrillation, unspecified: Secondary | ICD-10-CM

## 2020-07-25 DIAGNOSIS — R202 Paresthesia of skin: Secondary | ICD-10-CM

## 2020-07-25 DIAGNOSIS — I63412 Cerebral infarction due to embolism of left middle cerebral artery: Secondary | ICD-10-CM | POA: Diagnosis not present

## 2020-07-25 MED ORDER — TOPIRAMATE 25 MG PO TABS: 25.0000 mg | ORAL_TABLET | Freq: Two times a day (BID) | ORAL | 3 refills | Status: DC

## 2020-07-25 NOTE — Patient Instructions (Signed)
I had a long d/w patient and her daughter about her  recent embolic stroke, atrial fibrillation, post stroke paresthesias,risk for recurrent stroke/TIAs, personally independently reviewed imaging studies and stroke evaluation results and answered questions.Continue Eliquis (apixaban) 5 mg twice daily  for secondary stroke prevention and maintain strict control of hypertension with blood pressure goal below 130/90, diabetes with hemoglobin A1c goal below 6.5% and lipids with LDL cholesterol goal below 70 mg/dL. I also advised the patient to eat a healthy diet with plenty of whole grains, cereals, fruits and vegetables, exercise regularly and maintain ideal body weight.  Continue ongoing use speech and physical therapy.  Discontinue gabapentin as its not working and she is having leg swelling and instead try Topamax 25 mg twice daily for postop paresthesias.  Followup in the future with my nurse practitioner Janett Billow in 3 months or call earlier if necessary. Stroke Prevention Some medical conditions and behaviors are associated with a higher chance of having a stroke. You can help prevent a stroke by making nutrition, lifestyle,and other changes, including managing any medical conditions you may have. What nutrition changes can be made?  Eat healthy foods. You can do this by: Choosing foods high in fiber, such as fresh fruits and vegetables and whole grains. Eating at least 5 or more servings of fruits and vegetables a day. Try to fill half of your plate at each meal with fruits and vegetables. Choosing lean protein foods, such as lean cuts of meat, poultry without skin, fish, tofu, beans, and nuts. Eating low-fat dairy products. Avoiding foods that are high in salt (sodium). This can help lower blood pressure. Avoiding foods that have saturated fat, trans fat, and cholesterol. This can help prevent high cholesterol. Avoiding processed and premade foods. Follow your health care provider's specific  guidelines for losing weight, controlling high blood pressure (hypertension), lowering high cholesterol, and managing diabetes. These may include: Reducing your daily calorie intake. Limiting your daily sodium intake to 1,500 milligrams (mg). Using only healthy fats for cooking, such as olive oil, canola oil, or sunflower oil. Counting your daily carbohydrate intake. What lifestyle changes can be made? Maintain a healthy weight. Talk to your health care provider about your ideal weight. Get at least 30 minutes of moderate physical activity at least 5 days a week. Moderate activity includes brisk walking, biking, and swimming. Do not use any products that contain nicotine or tobacco, such as cigarettes and e-cigarettes. If you need help quitting, ask your health care provider. It may also be helpful to avoid exposure to secondhand smoke. Limit alcohol intake to no more than 1 drink a day for nonpregnant women and 2 drinks a day for men. One drink equals 12 oz of beer, 5 oz of wine, or 1 oz of hard liquor. Stop any illegal drug use. Avoid taking birth control pills. Talk to your health care provider about the risks of taking birth control pills if: You are over 33 years old. You smoke. You get migraines. You have ever had a blood clot. What other changes can be made? Manage your cholesterol levels. Eating a healthy diet is important for preventing high cholesterol. If cholesterol cannot be managed through diet alone, you may also need to take medicines. Take any prescribed medicines to control your cholesterol as told by your health care provider. Manage your diabetes. Eating a healthy diet and exercising regularly are important parts of managing your blood sugar. If your blood sugar cannot be managed through diet and exercise, you may  need to take medicines. Take any prescribed medicines to control your diabetes as told by your health care provider. Control your hypertension. To reduce your  risk of stroke, try to keep your blood pressure below 130/80. Eating a healthy diet and exercising regularly are an important part of controlling your blood pressure. If your blood pressure cannot be managed through diet and exercise, you may need to take medicines. Take any prescribed medicines to control hypertension as told by your health care provider. Ask your health care provider if you should monitor your blood pressure at home. Have your blood pressure checked every year, even if your blood pressure is normal. Blood pressure increases with age and some medical conditions. Get evaluated for sleep disorders (sleep apnea). Talk to your health care provider about getting a sleep evaluation if you snore a lot or have excessive sleepiness. Take over-the-counter and prescription medicines only as told by your health care provider. Aspirin or blood thinners (antiplatelets or anticoagulants) may be recommended to reduce your risk of forming blood clots that can lead to stroke. Make sure that any other medical conditions you have, such as atrial fibrillation or atherosclerosis, are managed. What are the warning signs of a stroke? The warning signs of a stroke can be easily remembered as BEFAST. B is for balance. Signs include: Dizziness. Loss of balance or coordination. Sudden trouble walking. E is for eyes. Signs include: A sudden change in vision. Trouble seeing. F is for face. Signs include: Sudden weakness or numbness of the face. The face or eyelid drooping to one side. A is for arms. Signs include: Sudden weakness or numbness of the arm, usually on one side of the body. S is for speech. Signs include: Trouble speaking (aphasia). Trouble understanding. T is for time. These symptoms may represent a serious problem that is an emergency. Do not wait to see if the symptoms will go away. Get medical help right away. Call your local emergency services (911 in the [place] Do not drive yourself  to the hospital. Other signs of stroke may include: A sudden, severe headache with no known cause. Nausea or vomiting. Seizure. Where to find more information For more information, visit: American Stroke Association: www.strokeassociation.org National Stroke Association: www.stroke.org Summary You can prevent a stroke by eating healthy, exercising, not smoking, limiting alcohol intake, and managing any medical conditions you may have. Do not use any products that contain nicotine or tobacco, such as cigarettes and e-cigarettes. If you need help quitting, ask your health care provider. It may also be helpful to avoid exposure to secondhand smoke. Remember BEFAST for warning signs of stroke. Get help right away if you or a loved one has any of these signs. This information is not intended to replace advice given to you by your health care provider. Make sure you discuss any questions you have with your healthcare provider. Document Revised: 01/10/2017 Document Reviewed: 03/05/2016 Elsevier Patient Education  2021 [employer].

## 2020-07-25 NOTE — Progress Notes (Signed)
[facility] [address] [place]. [place] [address] [phone]       OFFICE CONSULT NOTE  [name] Date of Birth:  03/28/1930 Medical Record Number:  [id number]   Referring MD:  Silvestre Mesi, PA-c  Reason for Referral: Stroke  HPI: Katrina Randolph is a pleasant 87 year old Caucasian lady seen today for initial office consultation visit for stroke.  History is obtained from the patient and her daughters accompanying her as well as review of electronic medical records and I personally reviewed pertinent available imaging films in PACS.  She has a past medical history of atrial fibrillation who was not on anticoagulation or antiplatelet medications secondary to chronic anemia.  Patient presented to [facility] emergency room with difficulty in communication and decreased right hemibody sensation and paresthesias.  The daughter also noticed some difficulty with walking and some right-sided weakness.  She presented outside the time window for tPA.  MRI scan of the brain was obtained which showed posterior division left MCA territory acute infarct with occlusion of multiple left M2 and M3 branches.  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  Transthoracic echo showed ejection fraction of 55 to 60% without any clot.  LDL cholesterol is 103 mg percent and hemoglobin A1c was 6.2.  After careful discussion about risk benefit of anticoagulation and bleeding risk and stroke prevention the family agreed to start Eliquis which was started.  Patient had previously seen electrophysiologist [name] and had initial discussion for consideration for watchman device but she declined this after careful review of risk-benefit.  Patient states she has noticed improvement in her speech and language and is currently undergoing outpatient speech and physical therapy.  Right-sided strength is improved  but she still has significant right-sided paresthesias.  She is has been started on gabapentin 100 mg 3 times daily but feels its not working plus she has noticed increased swelling in her feet.  She has not had recurrent stroke or TIA symptoms.  She however has noticed headaches since her stroke.  They are not constant but they are all the time.  At times in the front but may also be on other occasions towards the back.  She describes this as a dull ache 5/10 in severity.  There is no accompanying nausea, vomiting, light or sound sensitivity.  She is able to carry on her usual activities despite the headaches.  She takes 2 tablets of Tylenol which seems to help.  She is also noticed mild tenderness in the scalp but denies any jaw claudication, blurred vision or loss of vision or muscle aches or pains.  She is tolerating Eliquis well without bleeding but does have minor bruising.  She lives alone and daughter keeps a close watch on her.  ROS:   14 system review of systems is positive for paresthesias, pain, swelling, speech and word finding difficulties, headache and all other systems negative  PMH:  Past Medical History:  Diagnosis Date   Atrial fibrillation ([place])     Social History:  Social History   Socioeconomic History   Marital status: Married    Spouse name: Not on file   Number of children: Not on file   Years of education: Not on file   Highest education level: Not on file  Occupational History   Not on file  Tobacco Use   Smoking status: Former    Pack years:  0.00    Types: Cigarettes   Smokeless tobacco: Never   Tobacco comments:    quit over 20 Years ago  Vaping Use   Vaping Use: Never used  Substance and Sexual Activity   Alcohol use: Yes    Comment: occational   Drug use: Never   Sexual activity: Not Currently  Other Topics Concern   Not on file  Social History Narrative   Lives alone   Right Handed    Drinks 2 cups caffeine daily   Social Determinants of  Health   Financial Resource Strain: Not on file  Food Insecurity: Not on file  Transportation Needs: Not on file  Physical Activity: Not on file  Stress: Not on file  Social Connections: Not on file  Intimate Partner Violence: Not on file    Medications:   Current Outpatient Medications on File Prior to Visit  Medication Sig Dispense Refill   acetaminophen (TYLENOL) 325 MG tablet Take 2 tablets (650 mg total) by mouth every 4 (four) hours as needed for mild pain (or temp > 37.5 C (99.5 F)).     apixaban (ELIQUIS) 5 MG TABS tablet Take 1 tablet (5 mg total) by mouth 2 (two) times daily. 60 tablet 0   atorvastatin (LIPITOR) 10 MG tablet Take 2 tablets (20 mg total) by mouth daily. 60 tablet 0   esomeprazole (NEXIUM) 20 MG capsule Take 1 capsule (20 mg total) by mouth daily. 30 capsule 0   furosemide (LASIX) 20 MG tablet Take 20 mg by mouth daily as needed for fluid.      levothyroxine (SYNTHROID) 112 MCG tablet Take 1 tablet (112 mcg total) by mouth every morning. 30 tablet 0   metoprolol succinate (TOPROL-XL) 25 MG 24 hr tablet Take 1 tablet (25 mg total) by mouth daily. Take with or immediately following a meal. 30 tablet 0   sertraline (ZOLOFT) 50 MG tablet Take 1 tablet (50 mg total) by mouth daily. 30 tablet 0   No current facility-administered medications on file prior to visit.    Allergies:   Allergies  Allergen Reactions   Sulfa Antibiotics     "feels bad"    Physical Exam General: Mildly obese pleasant elderly Caucasian lady, seated, in no evident distress Head: head normocephalic and atraumatic.   Neck: supple with no carotid or supraclavicular bruits Cardiovascular: irregular rate and rhythm, no murmurs Musculoskeletal: no deformity Skin:  no rash/petichiae Vascular:  Normal pulses all extremities.  Bilateral ankle swelling.  Neurologic Exam Mental Status: Awake and fully alert. Oriented to place and time. Recent and remote memory intact. Attention span,  concentration and fund of knowledge appropriate. Mood and affect appropriate.  Mild expressive aphasia with occasional paraphasic errors and word finding difficulties.  No dysarthria Cranial Nerves: Fundoscopic exam reveals sharp disc margins. Pupils equal, briskly reactive to light. Extraocular movements full without nystagmus. Visual fields full to confrontation. Hearing intact. Facial sensation intact. Face, tongue, palate moves normally and symmetrically.  Motor: Normal bulk and tone. Normal strength in all tested extremity   Sensory :.  Subjective paresthesias in the right upper and lower extremity.  Intact position vibration sense. Coordination: Rapid alternating movements normal in all extremities. Finger-to-nose and heel-to-shin performed accurately bilaterally. Gait and Station: Arises from chair without difficulty. Stance is normal. Gait demonstrates normal stride length and balance . Able to heel, toe and tandem walk without difficulty.  Reflexes: 1+ and symmetric. Toes downgoing.   NIHSS  2 Modified Rankin  2  ASSESSMENT: 87 year old Caucasian lady with left MCA branch infarct and April 2022 secondary to chronic atrial fibrillation while not on anticoagulation due to recurrent symptomatic anemia.  She has done reasonably well but has significant residual postop dysesthesias and mild headaches which seem like tension headaches.  Vascular risk factors of atrial fibrillation, hypertension, hyperlipidemia, borderline diabetes, mild obesity and age     PLAN:I had a long d/w patient and her daughter about her  recent embolic stroke, atrial fibrillation, post stroke paresthesias,risk for recurrent stroke/TIAs, personally independently reviewed imaging studies and stroke evaluation results and answered questions.Continue Eliquis (apixaban) 5 mg twice daily  for secondary stroke prevention and maintain strict control of hypertension with blood pressure goal below 130/90, diabetes with hemoglobin  A1c goal below 6.5% and lipids with LDL cholesterol goal below 70 mg/dL. I also advised the patient to eat a healthy diet with plenty of whole grains, cereals, fruits and vegetables, exercise regularly and maintain ideal body weight.  Continue ongoing use speech and physical therapy.  Discontinue gabapentin as its not working and she is having leg swelling and instead try Topamax 25 mg twice daily for postop paresthesias.  Followup in the future with my nurse practitioner Janett Billow in 3 months or call earlier if necessary.Greater than 50% time during this 45-minute visit was spent on counseling and coordination of care about her embolic stroke, poststroke paresthesias and tension headache and answering questions. Antony Contras, MD  Note: This document was prepared with digital dictation and possible smart phrase technology. Any transcriptional errors that result from this process are unintentional.

## 2020-07-26 ENCOUNTER — Ambulatory Visit: Payer: Medicare Other

## 2020-07-26 ENCOUNTER — Ambulatory Visit: Payer: Medicare Other | Admitting: Speech Pathology

## 2020-07-27 ENCOUNTER — Telehealth: Payer: Self-pay | Admitting: Neurology

## 2020-07-27 NOTE — Telephone Encounter (Signed)
Reached patient's daughter Santiago Glad.  Went over Dr. Clydene Fake review and recommendation.    Patient denied further questions, verbalized understanding and expressed appreciation for the phone call.

## 2020-07-27 NOTE — Telephone Encounter (Signed)
Pt's daughter, Juwana Thoreson (on [place]) called, she is not tolerating topiramate (TOPAMAX) 25 MG tablet  Her heart rate dropped, dizziness. She stop taking last night and she feels better. Would like a call from the nurse.

## 2020-07-31 ENCOUNTER — Other Ambulatory Visit: Payer: Self-pay

## 2020-07-31 ENCOUNTER — Ambulatory Visit: Payer: Medicare Other

## 2020-07-31 ENCOUNTER — Encounter: Payer: Self-pay | Admitting: Speech Pathology

## 2020-07-31 ENCOUNTER — Ambulatory Visit: Payer: Medicare Other | Admitting: Speech Pathology

## 2020-07-31 VITALS — BP 127/85

## 2020-07-31 DIAGNOSIS — R252 Cramp and spasm: Secondary | ICD-10-CM

## 2020-07-31 DIAGNOSIS — R4701 Aphasia: Secondary | ICD-10-CM

## 2020-07-31 DIAGNOSIS — I63512 Cerebral infarction due to unspecified occlusion or stenosis of left middle cerebral artery: Secondary | ICD-10-CM

## 2020-07-31 DIAGNOSIS — M6281 Muscle weakness (generalized): Secondary | ICD-10-CM

## 2020-07-31 DIAGNOSIS — R2681 Unsteadiness on feet: Secondary | ICD-10-CM

## 2020-07-31 DIAGNOSIS — R262 Difficulty in walking, not elsewhere classified: Secondary | ICD-10-CM

## 2020-07-31 NOTE — Therapy (Signed)
[place]. [place], [place], [address] Phone: [phone]   Fax:  [phone]  Physical Therapy Treatment  Patient Details  Name: Katrina Randolph MRN: [id number] Date of Birth: 1930-03-19 Referring Provider (PT): Posey Pronto   Encounter Date: 07/31/2020   PT End of Session - 07/31/20 6283     Visit Number 9    Number of Visits 17    Date for PT Re-Evaluation 08/21/20    Authorization Type [employer] MCR- medical necessity    PT Start Time 1300    PT Stop Time 1345    PT Time Calculation (min) 45 min    Activity Tolerance Patient tolerated treatment well    Behavior During Therapy [facility] for tasks assessed/performed             Past Medical History:  Diagnosis Date   Atrial fibrillation [facility])     Past Surgical History:  Procedure Laterality Date   ABDOMINAL HYSTERECTOMY     BIOPSY  10/19/2019   Procedure: BIOPSY;  Surgeon: [name], MD;  Location: [facility];  Service: Gastroenterology;;   BREAST LUMPECTOMY     CHOLECYSTECTOMY     COLONOSCOPY WITH PROPOFOL N/A 10/19/2019   Procedure: COLONOSCOPY WITH PROPOFOL;  Surgeon: [name], MD;  Location: [facility];  Service: Gastroenterology;  Laterality: N/A;   ESOPHAGOGASTRODUODENOSCOPY (EGD) WITH PROPOFOL N/A 10/19/2019   Procedure: ESOPHAGOGASTRODUODENOSCOPY (EGD) WITH PROPOFOL;  Surgeon: [name], MD;  Location: [facility];  Service: Gastroenterology;  Laterality: N/A;   POLYPECTOMY  10/19/2019   Procedure: POLYPECTOMY;  Surgeon: [name], MD;  Location: [facility];  Service: Gastroenterology;;    Vitals:   07/31/20 1329 07/31/20 1334  BP: (!) 131/94 127/85     Subjective Assessment - 07/31/20 1304     Subjective Doing okay. Pt entered with SPC today.    Patient Stated Goals to be able to improve speech, to walk without walker    Currently in Pain? No/denies                               [facility]  Adult PT Treatment/Exercise - 07/31/20 0001       Ambulation/Gait   Gait Comments gait with SPC vs 1 HHA and supervision in the clinic about 100'      High Level Balance   High Level Balance Activities Side stepping;Backward walking;Turns   4 laps side stepping- required seated rest post     Therapeutic Activites    Other Therapeutic Activities Pursed lip breathing, active recovery in sitting to manage fatigue/SOB. Therapeutic rest breaks.      Knee/Hip Exercises: Aerobic   Nustep L5 x 6 min      Knee/Hip Exercises: Machines for Strengthening   Cybex Knee Extension 5# 2x10    Cybex Knee Flexion 15# 2x10      Knee/Hip Exercises: Standing   Hip Abduction Both;2 sets;10 reps    Hip Extension Both;2 sets;10 reps                      PT Short Term Goals - 06/19/20 1116       PT SHORT TERM GOAL #1   Title Independent with initial HEP    Time 2    Period Weeks    Status New    Target Date 07/03/20               PT  Long Term Goals - 07/20/20 1524       PT LONG TERM GOAL #1   Title Independent with advanced HEP      PT LONG TERM GOAL #2   Title Pt will be able to complete 5TSTS and TUG independent and without LOB in </= 12 seconds to demonstrate improved functional strength, gait speed, and balance.    Status On-going                   Plan - 07/31/20 1306     Clinical Impression Statement Biggest limiting factor continues to be fatigue and SOB on exertion requiring therapeutic rest breaks. Assessed BP mid session and elevated to 131//94, provided rest break and reassessed and it decreased to 127/85 with rest.  Emphasis of todays actiivties on functional strength balance.  Continued education in pursed lip breathing to manage SOB/fatigue with some improvement noted. Cotninue gait trianing with SPC and CS, intermittent HHA but she does demo instances of instability with sharp turns or distraction and I advised that I recommend continued use of walker  when leaving the home to provide more stability, prevent possible fall.  Continue to monitor SOB/fatigue and BP with all exercises/activities    PT Next Visit Plan work on her balance and functional safety    Consulted and Agree with Plan of Care Patient             Patient will benefit from skilled therapeutic intervention in order to improve the following deficits and impairments:  Abnormal gait, Decreased endurance, Pain, Decreased balance, Decreased mobility, Decreased strength, Impaired sensation  Visit Diagnosis: Unsteadiness on feet  Muscle weakness (generalized)  Difficulty in walking, not elsewhere classified  Left middle cerebral artery stroke (HCC)  Cramp and spasm     Problem List Patient Active Problem List   Diagnosis Date Noted   Neuropathic pain    Dysesthesia    Prediabetes    Expressive aphasia    Atrial fibrillation ([place])    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) 10/12/2019   Pneumonia 10/12/2019   Anemia requiring transfusions 10/12/2019   DNR (do not resuscitate) 10/12/2019   Acquired thrombophilia ([place]) 10/12/2019   Iron deficiency anemia 11/24/2017   Paroxysmal atrial fibrillation ([place]) 11/24/2017   Chronic depression 11/24/2017   GERD (gastroesophageal reflux disease) 11/24/2017   Breast cancer ([place]) 11/24/2017   Hypothyroid 11/24/2017   Dyslipidemia 11/24/2017    Hall Busing, PT, DPT 07/31/2020, 3:09 PM  [place]. [place], [place], [address] Phone: [phone]   Fax:  [phone]  Name: Katrina Randolph MRN: [id number] Date of Birth: January 29, 1931

## 2020-07-31 NOTE — Therapy (Signed)
[place]. [place], [place], [address] Phone: [phone]   Fax:  [phone]  Speech Language Pathology Treatment  Patient Details  Name: Katrina Randolph MRN: [id number] Date of Birth: 1930/09/23 Referring Provider (SLP): Jamse Arn MD   Encounter Date: 07/31/2020   End of Session - 07/31/20 1426     Visit Number 9    Number of Visits 17    Date for SLP Re-Evaluation 08/19/20    SLP Start Time 51    SLP Stop Time  1428    SLP Time Calculation (min) 43 min    Activity Tolerance Patient tolerated treatment well             Past Medical History:  Diagnosis Date   Atrial fibrillation [facility])     Past Surgical History:  Procedure Laterality Date   ABDOMINAL HYSTERECTOMY     BIOPSY  10/19/2019   Procedure: BIOPSY;  Surgeon: [name], MD;  [facility]: [facility];  Service: Gastroenterology;;   BREAST LUMPECTOMY     CHOLECYSTECTOMY     COLONOSCOPY WITH PROPOFOL N/A 10/19/2019   Procedure: COLONOSCOPY WITH PROPOFOL;  Surgeon: [name], MD;  [facility]: [facility];  Service: Gastroenterology;  Laterality: N/A;   ESOPHAGOGASTRODUODENOSCOPY (EGD) WITH PROPOFOL N/A 10/19/2019   Procedure: ESOPHAGOGASTRODUODENOSCOPY (EGD) WITH PROPOFOL;  Surgeon: [name], MD;  [facility];  Service: Gastroenterology;  Laterality: N/A;   POLYPECTOMY  10/19/2019   Procedure: POLYPECTOMY;  Surgeon: [name], MD;  [facility];  Service: Gastroenterology;;    There were no vitals filed for this visit.   Subjective Assessment - 07/31/20 1354     Subjective Pt reports that the doctor has taken off her two medications. She is feeling discouraged because she hasn't been feel well.    Currently in Pain? No/denies                   ADULT SLP TREATMENT - 07/31/20 1355       General Information   Behavior/Cognition Alert;Pleasant mood      Treatment Provided    Treatment provided Cognitive-Linquistic      Cognitive-Linquistic Treatment   Treatment focused on Aphasia;Apraxia    Skilled Treatment Pt required modeling for 50% of sounds corresponding to letter. Pt has difficulty with sound-letter concept. Pt was able to repeat sounds when cued with 100% acc. Facilitated verbal expression by naming pictures. Pt benefited from picturing the word in her head to assist with correct pronunciation. Continues to exhibit word finding difficulty.      Assessment / Recommendations / Plan   Plan Continue with current plan of care      Progression Toward Goals   Progression toward goals Progressing toward goals                SLP Short Term Goals - 07/31/20 1427       SLP SHORT TERM GOAL #1   Title Pt will recall 3 word finding strategies to assist with finding words.    Time 1    Period Weeks    Status On-going      SLP SHORT TERM GOAL #2   Title Pt will produce single word without phonemic paraphasias given a single model.    Time 1    Period Weeks    Status Achieved              SLP Long Term Goals - 07/31/20 1428  SLP LONG TERM GOAL #1   Title Pt will produce <2 phonemic errors in simple sentence (<4 words).    Time 4    Period Weeks    Status Revised      SLP LONG TERM GOAL #2   Title Pt will name 4 semantic features of a given object.    Time 4    Period Weeks    Status On-going              Plan - 07/31/20 1426     Clinical Impression Statement Trained patient in phonomotor treatment. Pt required modA for letter-sound correspondance - making improvement from previous sessions. Benefits from visual cue of mouth.    Speech Therapy Frequency 2x / week    Duration 8 weeks    Treatment/Interventions Cueing hierarchy;Functional tasks;Patient/family education;Compensatory strategies;Environmental controls;Multimodal communcation approach;Language facilitation;Compensatory techniques;Cognitive reorganization;SLP  instruction and feedback;Internal/external aids    Potential to Achieve Goals Good    Consulted and Agree with Plan of Care Patient             Patient will benefit from skilled therapeutic intervention in order to improve the following deficits and impairments:   Aphasia    Problem List Patient Active Problem List   Diagnosis Date Noted   Neuropathic pain    Dysesthesia    Prediabetes    Expressive aphasia    Atrial fibrillation ([place])    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   CVA (cerebral vascular accident) ([place]) 05/30/2020   Orthostatic hypotension 01/26/2020   Essential hypertension 01/26/2020   Acute respiratory failure ([place]) 10/19/2019   Colon polyp 10/19/2019   Orthostatic dizziness 10/19/2019   Hypothyroidism 10/19/2019   Rapid atrial fibrillation ([place]) 10/12/2019   Pneumonia 10/12/2019   Anemia requiring transfusions 10/12/2019   DNR (do not resuscitate) 10/12/2019   Acquired thrombophilia ([place]) 10/12/2019   Iron deficiency anemia 11/24/2017   Paroxysmal atrial fibrillation ([place]) 11/24/2017   Chronic depression 11/24/2017   GERD (gastroesophageal reflux disease) 11/24/2017   Breast cancer ([place]) 11/24/2017   Hypothyroid 11/24/2017   Dyslipidemia 11/24/2017    Katrina Randolph Ruth MS, [place], CBIS  07/31/2020, 2:28 PM  [place]. [place], [place], [address] Phone: [phone]   Fax:  [phone]   Name: Katrina Randolph MRN: [id number] Date of Birth: 09/10/1930

## 2020-08-03 ENCOUNTER — Encounter: Payer: Self-pay | Admitting: Speech Pathology

## 2020-08-03 ENCOUNTER — Ambulatory Visit: Payer: Medicare Other

## 2020-08-03 ENCOUNTER — Ambulatory Visit: Payer: Medicare Other | Admitting: Speech Pathology

## 2020-08-03 ENCOUNTER — Other Ambulatory Visit: Payer: Self-pay

## 2020-08-03 DIAGNOSIS — R4701 Aphasia: Secondary | ICD-10-CM

## 2020-08-03 DIAGNOSIS — R262 Difficulty in walking, not elsewhere classified: Secondary | ICD-10-CM

## 2020-08-03 DIAGNOSIS — R252 Cramp and spasm: Secondary | ICD-10-CM

## 2020-08-03 DIAGNOSIS — I63512 Cerebral infarction due to unspecified occlusion or stenosis of left middle cerebral artery: Secondary | ICD-10-CM

## 2020-08-03 DIAGNOSIS — M6281 Muscle weakness (generalized): Secondary | ICD-10-CM

## 2020-08-03 DIAGNOSIS — R2681 Unsteadiness on feet: Secondary | ICD-10-CM

## 2020-08-03 NOTE — Therapy (Signed)
[place] [place], [place], [address] Phone: [phone]   Fax:  [phone]  Physical Therapy Treatment 10th visit Progress Note Reporting Period 06/19/20 to 08/03/20  See note below for Objective Data and Assessment of Progress/Goals.     Patient Details  Name: Katrina Randolph MRN: [id number] Date of Birth: 1930/09/21 Referring Provider (PT): Posey Pronto   Encounter Date: 08/03/2020   PT End of Session - 08/03/20 1432     Visit Number 10    Number of Visits 17    Date for PT Re-Evaluation 08/21/20    Authorization Type UHC MCR- medical necessity    PT Start Time 6962    PT Stop Time 1430    PT Time Calculation (min) 42 min    Activity Tolerance Patient tolerated treatment well    Behavior During Therapy [facility] for tasks assessed/performed             Past Medical History:  Diagnosis Date   Atrial fibrillation [facility])     Past Surgical History:  Procedure Laterality Date   ABDOMINAL HYSTERECTOMY     BIOPSY  10/19/2019   Procedure: BIOPSY;  Surgeon: [name], MD;  Location: [facility];  Service: Gastroenterology;;   BREAST LUMPECTOMY     CHOLECYSTECTOMY     COLONOSCOPY WITH PROPOFOL N/A 10/19/2019   Procedure: COLONOSCOPY WITH PROPOFOL;  Surgeon: [name], MD;  [facility];  Service: Gastroenterology;  Laterality: N/A;   ESOPHAGOGASTRODUODENOSCOPY (EGD) WITH PROPOFOL N/A 10/19/2019   Procedure: ESOPHAGOGASTRODUODENOSCOPY (EGD) WITH PROPOFOL;  Surgeon: [name], MD;  [facility];  Service: Gastroenterology;  Laterality: N/A;   POLYPECTOMY  10/19/2019   Procedure: POLYPECTOMY;  Surgeon: [name], MD;  [facility];  Service: Gastroenterology;;    There were no vitals filed for this visit.   Subjective Assessment - 08/03/20 1350     Subjective "thought Id do without it today" csame in without AD today. Same pain along right side.     Patient Stated Goals to be able to improve speech, to walk without walker                [facility] PT Assessment - 08/03/20 0001       Berg Balance Test   Sit to Stand Able to stand without using hands and stabilize independently    Standing Unsupported Able to stand safely 2 minutes    Sitting with Back Unsupported but Feet Supported on Floor or Stool Able to sit safely and securely 2 minutes    Stand to Sit Sits safely with minimal use of hands    Transfers Able to transfer safely, definite need of hands    Standing Unsupported with Eyes Closed Able to stand 10 seconds safely    Standing Unsupported with Feet Together Able to place feet together independently and stand for 1 minute with supervision    From Standing, Reach Forward with Outstretched Arm Can reach forward >12 cm safely (5")    From Standing Position, Pick up Object from Floor Able to pick up shoe safely and easily    From Standing Position, Turn to Look Behind Over each Shoulder Looks behind one side only/other side shows less weight shift    Turn 360 Degrees Able to turn 360 degrees safely in 4 seconds or less    Standing Unsupported, Alternately Place Feet on Step/Stool Able to stand independently and complete 8 steps >20 seconds    Standing Unsupported, One  Foot in [place] to take small step independently and hold 30 seconds    Standing on One Leg Tries to lift leg/unable to hold 3 seconds but remains standing independently    Total Score 46                           [facility] Adult PT Treatment/Exercise - 08/03/20 0001       Ambulation/Gait   Gait Comments Gait wihtout AD and with CS/CGA 80 ft x 2      High Level Balance   High Level Balance Activities Side stepping;Backward walking;Turns    High Level Balance Comments  bars negotiating 4" step and airex pads using UE support vs no UE support/CS-CGA for LOB. Tandem balance with UE taps 1 UE supported 5 each side.      Therapeutic Activites     Other Therapeutic Activities Pursed lip breathing, active recovery in sitting to manage fatigue/SOB. Therapeutic rest breaks.      Knee/Hip Exercises: Aerobic   Nustep L5 x 6 min      Knee/Hip Exercises: Seated   Sit to Sand 10 reps;1 set   from airex, 2 sets of 5 with cues for eccentric control. Standing  balance practice with each repetition                     PT Short Term Goals - 08/03/20 1353       PT SHORT TERM GOAL #1   Title Independent with initial HEP    Time 2    Period Weeks    Status Achieved               PT Long Term Goals - 08/03/20 1353       PT LONG TERM GOAL #1   Title Independent with advanced HEP    Status On-going      PT LONG TERM GOAL #2   Title Pt will be able to complete 5TSTS and TUG independent and without LOB in </= 12 seconds to demonstrate improved functional strength, gait speed, and balance.    Baseline 06/19/2020: 5TSTS 23 seconds without UE support, TUG 22 seconds without walker and with CGA/gait belt.  08/03/20: 5TSTS 19 seconds without UE support. TUG 17 seconds CS/CGA without AD, some breathlessness persists.    Status On-going      PT LONG TERM GOAL #3   Title Pt will score at least 46/56 on the berg balance scale to demonstrate decreased falls risk    Baseline 06/19/20: 36/56.  08/03/20: 46/56    Status Achieved      PT LONG TERM GOAL #4   Title BLE strength at least 4+/5    Status On-going      PT LONG TERM GOAL #5   Title Pt will be able to ambulate indoor and outdoor uneven surfaces and negotiate obstacles with LRAD and no LOB to facilitate safety within home and community environments.    Baseline without AD intermittent LOB, leg crossing espeically with turns or direction changes. Instability with narrow base and SLS activities    Status On-going                   Plan - 08/03/20 1434     Clinical Impression Statement Lanie is making good progress towards therapy goals for functional strength balance  and gait. However safety awareness does seem to be inconsistent and we do consistently need to take  many breaks in a session d/t complaints of shortness of breath, fatigue  with activity. She entered todays session without any AD even though we discussed t previous session use of walker especially for busy environments d/t instability with functional gait (uneven surfaces, obstacles ) when se doesnt have UE support. Reinforced HEP and use of AD espcially in community environments today and pt verbalized agreement. Plan to continue per POC.    PT Next Visit Plan work on her balance and functional safety    Consulted and Agree with Plan of Care Patient             Patient will benefit from skilled therapeutic intervention in order to improve the following deficits and impairments:  Abnormal gait, Decreased endurance, Pain, Decreased balance, Decreased mobility, Decreased strength, Impaired sensation  Visit Diagnosis: Unsteadiness on feet  Muscle weakness (generalized)  Difficulty in walking, not elsewhere classified  Left middle cerebral artery stroke (HCC)  Cramp and spasm     Problem List Patient Active Problem List   Diagnosis Date Noted   Neuropathic pain    Dysesthesia    Prediabetes    Expressive aphasia    Atrial fibrillation ([place])    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   CVA (cerebral vascular accident) ([place]) 05/30/2020   Orthostatic hypotension 01/26/2020   Essential hypertension 01/26/2020   Acute respiratory failure ([place]) 10/19/2019   Colon polyp 10/19/2019   Orthostatic dizziness 10/19/2019   Hypothyroidism 10/19/2019   Rapid atrial fibrillation ([place]) 10/12/2019   Pneumonia 10/12/2019   Anemia requiring transfusions 10/12/2019   DNR (do not resuscitate) 10/12/2019   Acquired thrombophilia ([place]) 10/12/2019   Iron deficiency anemia 11/24/2017   Paroxysmal atrial fibrillation ([place]) 11/24/2017   Chronic depression 11/24/2017   GERD  (gastroesophageal reflux disease) 11/24/2017   Breast cancer ([place]) 11/24/2017   Hypothyroid 11/24/2017   Dyslipidemia 11/24/2017    Hall Busing, PT, DPT 08/03/2020, 2:38 PM  [place]. [place], [place], [address] Phone: [phone]   Fax:  [phone]  Name: Sanaya Gwilliam MRN: [id number] Date of Birth: Jul 28, 1930

## 2020-08-03 NOTE — Therapy (Addendum)
[place] [place], [place], [address] Phone: [phone]   Fax:  [phone]  Speech Language Pathology Treatment  Patient Details  Name: Katrina Randolph MRN: [id number] Date of Birth: 1930-04-08 Referring Provider (SLP): Jamse Arn MD   Encounter Date: 08/03/2020   End of Session - 08/03/20 1448     Visit Number 10    Number of Visits 17    Date for SLP Re-Evaluation 08/19/20    SLP Start Time 1445    SLP Stop Time  1525    SLP Time Calculation (min) 40 min    Activity Tolerance Patient tolerated treatment well             Past Medical History:  Diagnosis Date   Atrial fibrillation [facility])     Past Surgical History:  Procedure Laterality Date   ABDOMINAL HYSTERECTOMY     BIOPSY  10/19/2019   Procedure: BIOPSY;  Surgeon: [name], MD;  [facility];  Service: Gastroenterology;;   BREAST LUMPECTOMY     CHOLECYSTECTOMY     COLONOSCOPY WITH PROPOFOL N/A 10/19/2019   Procedure: COLONOSCOPY WITH PROPOFOL;  Surgeon: [name], MD;  [facility];  Service: Gastroenterology;  Laterality: N/A;   ESOPHAGOGASTRODUODENOSCOPY (EGD) WITH PROPOFOL N/A 10/19/2019   Procedure: ESOPHAGOGASTRODUODENOSCOPY (EGD) WITH PROPOFOL;  Surgeon: [name], MD;  [facility];  Service: Gastroenterology;  Laterality: N/A;   POLYPECTOMY  10/19/2019   Procedure: POLYPECTOMY;  Surgeon: [name], MD;  [facility];  Service: Gastroenterology;;    There were no vitals filed for this visit.   Subjective Assessment - 08/03/20 1447     Subjective Pt reports she feels like her speech has gotten better.    Currently in Pain? No/denies                   ADULT SLP TREATMENT - 08/03/20 1515       General Information   Behavior/Cognition Alert;Pleasant mood      Treatment Provided   Treatment provided Cognitive-Linquistic       Cognitive-Linquistic Treatment   Treatment focused on Aphasia;Apraxia    Skilled Treatment Pt benefited from phonomotor treatment. She had minimal spelling errors when naming & writing verbs from pictures this date. Pt was aware of errors made, but unable to correct independently.      Assessment / Recommendations / Plan   Plan Continue with current plan of care      Progression Toward Goals   Progression toward goals Progressing toward goals                SLP Short Term Goals - 08/03/20 1449       SLP SHORT TERM GOAL #1   Title Pt will recall 3 word finding strategies to assist with finding words.    Time 1    Period Weeks    Status Achieved      SLP SHORT TERM GOAL #2   Title Pt will correct single words with phonemic paraphasias independently.    Time 2    Period Weeks    Status New              SLP Long Term Goals - 08/03/20 1505       SLP LONG TERM GOAL #1   Title Pt will produce <2 phonemic errors in simple sentence (<4 words).    Time 4    Period Weeks    Status Revised  SLP LONG TERM GOAL #2   Title Pt will name 4 semantic features of a given object.    Time 4    Period Weeks    Status On-going              Plan - 08/03/20 1449     Clinical Impression Statement Trained patient in phonomotor treatment. Pt required modA for letter-sound correspondance - making improvement from previous sessions. Benefits from visual cue of mouth.    Speech Therapy Frequency 2x / week    Duration 8 weeks    Treatment/Interventions Cueing hierarchy;Functional tasks;Patient/family education;Compensatory strategies;Environmental controls;Multimodal communcation approach;Language facilitation;Compensatory techniques;Cognitive reorganization;SLP instruction and feedback;Internal/external aids    Potential to Achieve Goals Good    Consulted and Agree with Plan of Care Patient             Patient will benefit from skilled therapeutic intervention in order  to improve the following deficits and impairments:   Aphasia    Problem List Patient Active Problem List   Diagnosis Date Noted   Neuropathic pain    Dysesthesia    Prediabetes    Expressive aphasia    Atrial fibrillation (HCC)    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   CVA (cerebral vascular accident) ([place]) 05/30/2020   Orthostatic hypotension 01/26/2020   Essential hypertension 01/26/2020   Acute respiratory failure ([place]) 10/19/2019   Colon polyp 10/19/2019   Orthostatic dizziness 10/19/2019   Hypothyroidism 10/19/2019   Rapid atrial fibrillation ([place]) 10/12/2019   Pneumonia 10/12/2019   Anemia requiring transfusions 10/12/2019   DNR (do not resuscitate) 10/12/2019   Acquired thrombophilia ([place]) 10/12/2019   Iron deficiency anemia 11/24/2017   Paroxysmal atrial fibrillation ([place]) 11/24/2017   Chronic depression 11/24/2017   GERD (gastroesophageal reflux disease) 11/24/2017   Breast cancer ([place]) 11/24/2017   Hypothyroid 11/24/2017   Dyslipidemia 11/24/2017   Speech Therapy Progress Note  Dates of Reporting Period: 06/19/20 to present  Subjective Statement: Pt reports she feels like she is getting better.   Objective: See tx notes  Goal Update: Meeting goals; see goal update.  Plan: Continue with phonomotor treatment  Reason Skilled Services are Required: Pt continues to be embarrassed by speech. SLP rec continued speech skills to assist with expressive language.  [place], [place], CBIS  08/03/2020, 4:34 PM  [place]. [place], [place], [address] Phone: [phone]   Fax:  [phone]   Name: Katrina Randolph MRN: [id number] Date of Birth: 08/18/1930

## 2020-08-07 ENCOUNTER — Encounter: Payer: Self-pay | Admitting: Physical Therapy

## 2020-08-07 ENCOUNTER — Other Ambulatory Visit: Payer: Self-pay

## 2020-08-07 ENCOUNTER — Ambulatory Visit: Payer: Medicare Other | Admitting: Speech Pathology

## 2020-08-07 ENCOUNTER — Encounter: Payer: Self-pay | Admitting: Speech Pathology

## 2020-08-07 ENCOUNTER — Ambulatory Visit: Payer: Medicare Other | Admitting: Physical Therapy

## 2020-08-07 DIAGNOSIS — R262 Difficulty in walking, not elsewhere classified: Secondary | ICD-10-CM

## 2020-08-07 DIAGNOSIS — R4701 Aphasia: Secondary | ICD-10-CM

## 2020-08-07 DIAGNOSIS — M6281 Muscle weakness (generalized): Secondary | ICD-10-CM

## 2020-08-07 DIAGNOSIS — R2681 Unsteadiness on feet: Secondary | ICD-10-CM

## 2020-08-07 NOTE — Therapy (Signed)
[place]. [place], [place], [address] Phone: [phone]   Fax:  [phone]  Physical Therapy Treatment  Patient Details  Name: Katrina Randolph MRN: [id number] Date of Birth: 1930-10-04 Referring Provider (PT): Posey Pronto   Encounter Date: 08/07/2020   PT End of Session - 08/07/20 2119     Visit Number 11    Date for PT Re-Evaluation 08/21/20    Authorization Type UHC MCR- medical necessity    PT Start Time 4174    PT Stop Time 1525    PT Time Calculation (min) 40 min    Activity Tolerance Patient tolerated treatment well;Patient limited by fatigue    Behavior During Therapy [facility] for tasks assessed/performed             Past Medical History:  Diagnosis Date   Atrial fibrillation [facility])     Past Surgical History:  Procedure Laterality Date   ABDOMINAL HYSTERECTOMY     BIOPSY  10/19/2019   Procedure: BIOPSY;  Surgeon: [name], MD;  [facility];  Service: Gastroenterology;;   BREAST LUMPECTOMY     CHOLECYSTECTOMY     COLONOSCOPY WITH PROPOFOL N/A 10/19/2019   Procedure: COLONOSCOPY WITH PROPOFOL;  Surgeon: [name], MD;  [facility];  Service: Gastroenterology;  Laterality: N/A;   ESOPHAGOGASTRODUODENOSCOPY (EGD) WITH PROPOFOL N/A 10/19/2019   Procedure: ESOPHAGOGASTRODUODENOSCOPY (EGD) WITH PROPOFOL;  Surgeon: [name], MD;  [facility];  Service: Gastroenterology;  Laterality: N/A;   POLYPECTOMY  10/19/2019   Procedure: POLYPECTOMY;  Surgeon: [name], MD;  [facility];  Service: Gastroenterology;;    There were no vitals filed for this visit.   Subjective Assessment - 08/07/20 1449     Subjective Patient reports no falls, reports just feels weak at times    Currently in Pain? No/denies                               [facility] Adult PT Treatment/Exercise - 08/07/20 0001       Ambulation/Gait   Gait  Comments no device 3 minutes 450 feet, very short of breath, some unsteadiness, one instance of a loss of balance needing Mod A to right      High Level Balance   High Level Balance Activities Side stepping;Backward walking;Turns;Tandem walking;Marching forwards    High Level Balance Comments ball toss, cone toe taps, then toe taps on the airex, with the marching had her do opposite hand to knee      Knee/Hip Exercises: Aerobic   Nustep L5 x 6 min      Knee/Hip Exercises: Machines for Strengthening   Cybex Knee Extension 5# 2x10    Cybex Knee Flexion 15# 2x10                      PT Short Term Goals - 08/03/20 1353       PT SHORT TERM GOAL #1   Title Independent with initial HEP    Time 2    Period Weeks    Status Achieved               PT Long Term Goals - 08/03/20 1353       PT LONG TERM GOAL #1   Title Independent with advanced HEP    Status On-going      PT LONG TERM GOAL #2   Title Pt will be able  to complete 5TSTS and TUG independent and without LOB in </= 12 seconds to demonstrate improved functional strength, gait speed, and balance.    Baseline 06/19/2020: 5TSTS 23 seconds without UE support, TUG 22 seconds without walker and with CGA/gait belt.  08/03/20: 5TSTS 19 seconds without UE support. TUG 17 seconds CS/CGA without AD, some breathlessness persists.    Status On-going      PT LONG TERM GOAL #3   Title Pt will score at least 46/56 on the berg balance scale to demonstrate decreased falls risk    Baseline 06/19/20: 36/56.  08/03/20: 46/56    Status Achieved      PT LONG TERM GOAL #4   Title BLE strength at least 4+/5    Status On-going      PT LONG TERM GOAL #5   Title Pt will be able to ambulate indoor and outdoor uneven surfaces and negotiate obstacles with LRAD and no LOB to facilitate safety within home and community environments.    Baseline without AD intermittent LOB, leg crossing espeically with turns or direction changes. Instability  with narrow base and SLS activities    Status On-going                   Plan - 08/07/20 1532     Clinical Impression Statement With the walk test she was able to go 450 feet in 3 minutes, had one instance of a near fall requiring mod A to correct, higher level balance activties are very difficult for her, she also is limited wue to fatigue, does require cues to breathe.  the marching with touching the opposite knee was very difficult for her , possibly procession issue    PT Next Visit Plan work on her balance and functional safety    Consulted and Agree with Plan of Care Patient             Patient will benefit from skilled therapeutic intervention in order to improve the following deficits and impairments:  Abnormal gait, Decreased endurance, Pain, Decreased balance, Decreased mobility, Decreased strength, Impaired sensation  Visit Diagnosis: Unsteadiness on feet  Muscle weakness (generalized)  Difficulty in walking, not elsewhere classified     Problem List Patient Active Problem List   Diagnosis Date Noted   Neuropathic pain    Dysesthesia    Prediabetes    Expressive aphasia    Atrial fibrillation ([place])    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   CVA (cerebral vascular accident) ([place]) 05/30/2020   Orthostatic hypotension 01/26/2020   Essential hypertension 01/26/2020   Acute respiratory failure ([place]) 10/19/2019   Colon polyp 10/19/2019   Orthostatic dizziness 10/19/2019   Hypothyroidism 10/19/2019   Rapid atrial fibrillation ([place]) 10/12/2019   Pneumonia 10/12/2019   Anemia requiring transfusions 10/12/2019   DNR (do not resuscitate) 10/12/2019   Acquired thrombophilia ([place]) 10/12/2019   Iron deficiency anemia 11/24/2017   Paroxysmal atrial fibrillation ([place]) 11/24/2017   Chronic depression 11/24/2017   GERD (gastroesophageal reflux disease) 11/24/2017   Breast cancer ([place]) 11/24/2017   Hypothyroid 11/24/2017   Dyslipidemia 11/24/2017     Sumner Boast., PT 08/07/2020, 3:34 PM  [place]. [place], [place], [address] Phone: [phone]   Fax:  [phone]  Name: Katrina Randolph MRN: [id number] Date of Birth: 10-Apr-1930

## 2020-08-07 NOTE — Therapy (Signed)
[place]. [place], [place], [address] Phone: [phone]   Fax:  [phone]  Speech Language Pathology Treatment  Patient Details  Name: Katrina Randolph MRN: [id number] Date of Birth: Oct 29, 1930 Referring Provider (SLP): Jamse Arn MD   Encounter Date: 08/07/2020   End of Session - 08/07/20 1417     Visit Number 11    Number of Visits 17    Date for SLP Re-Evaluation 08/19/20    SLP Start Time 8   Pt late   SLP Stop Time  1443    SLP Time Calculation (min) 38 min    Activity Tolerance Patient tolerated treatment well             Past Medical History:  Diagnosis Date   Atrial fibrillation [facility])     Past Surgical History:  Procedure Laterality Date   ABDOMINAL HYSTERECTOMY     BIOPSY  10/19/2019   Procedure: BIOPSY;  Surgeon: [name], MD;  [facility];  Service: Gastroenterology;;   BREAST LUMPECTOMY     CHOLECYSTECTOMY     COLONOSCOPY WITH PROPOFOL N/A 10/19/2019   Procedure: COLONOSCOPY WITH PROPOFOL;  Surgeon: [name], MD;  [facility];  Service: Gastroenterology;  Laterality: N/A;   ESOPHAGOGASTRODUODENOSCOPY (EGD) WITH PROPOFOL N/A 10/19/2019   Procedure: ESOPHAGOGASTRODUODENOSCOPY (EGD) WITH PROPOFOL;  Surgeon: [name], MD;  [facility];  Service: Gastroenterology;  Laterality: N/A;   POLYPECTOMY  10/19/2019   Procedure: POLYPECTOMY;  Surgeon: [name], MD;  [facility];  Service: Gastroenterology;;    There were no vitals filed for this visit.   Subjective Assessment - 08/07/20 1412     Subjective Pt reports her neuropathy pain is worse today.    Currently in Pain? No/denies    Pain Score 4     Pain Descriptors / Indicators Other (Comment)   Neuropathy   Pain Type Chronic pain    Pain Frequency Intermittent                   ADULT SLP TREATMENT - 08/07/20 1545       General  Information   Behavior/Cognition Alert;Pleasant mood      Treatment Provided   Treatment provided Cognitive-Linquistic      Cognitive-Linquistic Treatment   Treatment focused on Aphasia;Apraxia    Skilled Treatment SLP facilitated verbal expression by encouraging open dialogue in conversation today about a topic of patient's choosing. Pt was encouraged to use her word finding strategies in conversation (delay, describe etc). Pt demonstrated less phonemic paraphasias today, but was inconsistently unintelligible. Intellgibility increased with increased loudness and decreasing rate.      Assessment / Recommendations / Plan   Plan Continue with current plan of care      Progression Toward Goals   Progression toward goals Progressing toward goals                SLP Short Term Goals - 08/07/20 1443       SLP SHORT TERM GOAL #1   Title Pt will recall 3 word finding strategies to assist with finding words.    Time 1    Period Weeks    Status Achieved      SLP SHORT TERM GOAL #2   Title Pt will correct single words with phonemic paraphasias independently.    Time 2    Period Weeks    Status On-going  SLP Long Term Goals - 08/07/20 1443       SLP LONG TERM GOAL #1   Title Pt will produce <2 phonemic errors in simple sentence (<4 words).    Time 3    Period Weeks    Status Revised      SLP LONG TERM GOAL #2   Title Pt will name 4 semantic features of a given object.    Time 3    Period Weeks    Status On-going              Plan - 08/07/20 1441     Clinical Impression Statement Practiced word finding in conversation. Pt required encouragement to complete thoughts during conversation when she had trouble with finding the word. Less phonemic errors noted in conversation this date. More hesitancy and anomia noted.    Speech Therapy Frequency 2x / week    Duration 8 weeks    Treatment/Interventions Cueing hierarchy;Functional tasks;Patient/family  education;Compensatory strategies;Environmental controls;Multimodal communcation approach;Language facilitation;Compensatory techniques;Cognitive reorganization;SLP instruction and feedback;Internal/external aids    Potential to Achieve Goals Good    Consulted and Agree with Plan of Care Patient             Patient will benefit from skilled therapeutic intervention in order to improve the following deficits and impairments:   Aphasia    Problem List Patient Active Problem List   Diagnosis Date Noted   Neuropathic pain    Dysesthesia    Prediabetes    Expressive aphasia    Atrial fibrillation ([place])    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   CVA (cerebral vascular accident) ([place]) 05/30/2020   Orthostatic hypotension 01/26/2020   Essential hypertension 01/26/2020   Acute respiratory failure ([place]) 10/19/2019   Colon polyp 10/19/2019   Orthostatic dizziness 10/19/2019   Hypothyroidism 10/19/2019   Rapid atrial fibrillation ([place]) 10/12/2019   Pneumonia 10/12/2019   Anemia requiring transfusions 10/12/2019   DNR (do not resuscitate) 10/12/2019   Acquired thrombophilia ([place]) 10/12/2019   Iron deficiency anemia 11/24/2017   Paroxysmal atrial fibrillation ([place]) 11/24/2017   Chronic depression 11/24/2017   GERD (gastroesophageal reflux disease) 11/24/2017   Breast cancer ([place]) 11/24/2017   Hypothyroid 11/24/2017   Dyslipidemia 11/24/2017    Rosann Auerbach [place] MS, [place], CBIS  08/07/2020, 3:50 PM  [place]. [place], [place], [address] Phone: [phone]   Fax:  [phone]   Name: Katrina Randolph MRN: [id number] Date of Birth: Jan 28, 1931

## 2020-08-09 ENCOUNTER — Ambulatory Visit: Payer: Medicare Other | Admitting: Speech Pathology

## 2020-08-09 ENCOUNTER — Other Ambulatory Visit: Payer: Self-pay

## 2020-08-09 ENCOUNTER — Ambulatory Visit: Payer: Medicare Other | Admitting: Physical Therapy

## 2020-08-09 ENCOUNTER — Encounter: Payer: Self-pay | Admitting: Physical Therapy

## 2020-08-09 DIAGNOSIS — R2681 Unsteadiness on feet: Secondary | ICD-10-CM

## 2020-08-09 DIAGNOSIS — M6281 Muscle weakness (generalized): Secondary | ICD-10-CM

## 2020-08-09 DIAGNOSIS — R262 Difficulty in walking, not elsewhere classified: Secondary | ICD-10-CM

## 2020-08-09 DIAGNOSIS — R4701 Aphasia: Secondary | ICD-10-CM

## 2020-08-09 DIAGNOSIS — I63512 Cerebral infarction due to unspecified occlusion or stenosis of left middle cerebral artery: Secondary | ICD-10-CM

## 2020-08-09 NOTE — Therapy (Signed)
[place]. [place], [place], [address] Phone: [phone]   Fax:  [phone]  Physical Therapy Treatment  Patient Details  Name: Katrina Randolph MRN: [id number] Date of Birth: 1930/07/25 Referring Provider (PT): Posey Pronto   Encounter Date: 08/09/2020   PT End of Session - 08/09/20 1601     Visit Number 12    Date for PT Re-Evaluation 08/21/20    Authorization Type [employer] MCR- medical necessity    PT Start Time 1529    PT Stop Time 1611    PT Time Calculation (min) 42 min    Activity Tolerance Patient tolerated treatment well    Behavior During Therapy [facility] for tasks assessed/performed             Past Medical History:  Diagnosis Date   Atrial fibrillation [facility])     Past Surgical History:  Procedure Laterality Date   ABDOMINAL HYSTERECTOMY     BIOPSY  10/19/2019   Procedure: BIOPSY;  Surgeon: [name], MD;  [facility];  Service: Gastroenterology;;   BREAST LUMPECTOMY     CHOLECYSTECTOMY     COLONOSCOPY WITH PROPOFOL N/A 10/19/2019   Procedure: COLONOSCOPY WITH PROPOFOL;  Surgeon: [name], MD;  [facility];  Service: Gastroenterology;  Laterality: N/A;   ESOPHAGOGASTRODUODENOSCOPY (EGD) WITH PROPOFOL N/A 10/19/2019   Procedure: ESOPHAGOGASTRODUODENOSCOPY (EGD) WITH PROPOFOL;  Surgeon: [name], MD;  [facility];  Service: Gastroenterology;  Laterality: N/A;   POLYPECTOMY  10/19/2019   Procedure: POLYPECTOMY;  Surgeon: [name], MD;  [facility];  Service: Gastroenterology;;    There were no vitals filed for this visit.   Subjective Assessment - 08/09/20 1534     Subjective Patient reports a little more pain in the right side today, reports feels electrical    Currently in Pain? Yes    Pain Score 6     Pain Location --   arm and leg   Pain Orientation Right    Aggravating Factors  just hurts                                [facility] Adult PT Treatment/Exercise - 08/09/20 0001       Ambulation/Gait   Gait Comments tried walking with a SPC, she did not do well really overthinking and it was more of a hinderance, did 4 x 110 feet without device with SBA      High Level Balance   High Level Balance Activities Side stepping;Backward walking;Turns;Tandem walking;Marching forwards    High Level Balance Comments ball toss, cone toe taps, then toe taps on the airex, with the marching had her do opposite hand to knee      Knee/Hip Exercises: Aerobic   Nustep L5 x 6 min                      PT Short Term Goals - 08/03/20 1353       PT SHORT TERM GOAL #1   Title Independent with initial HEP    Time 2    Period Weeks    Status Achieved               PT Long Term Goals - 08/09/20 1609       PT LONG TERM GOAL #1   Title Independent with advanced HEP    Status On-going  Plan - 08/09/20 1601     Clinical Impression Statement She ahd better tolerance to the walking today, less SOB and less LOB, however she is unsafe with a SPC difficulty with sequencing and it seems to make her more unsafe, she struggled with the airex today, she tends to close her eyes when she struggles and this just made the balance worse.  She still struggles with finding words to communicate, expressively    PT Next Visit Plan work on her balance and functional safety    Consulted and Agree with Plan of Care Patient             Patient will benefit from skilled therapeutic intervention in order to improve the following deficits and impairments:  Abnormal gait, Decreased endurance, Pain, Decreased balance, Decreased mobility, Decreased strength, Impaired sensation  Visit Diagnosis: Unsteadiness on feet  Muscle weakness (generalized)  Difficulty in walking, not elsewhere classified  Left middle cerebral artery stroke [facility])     Problem  List Patient Active Problem List   Diagnosis Date Noted   Neuropathic pain    Dysesthesia    Prediabetes    Expressive aphasia    Atrial fibrillation ([place])    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   CVA (cerebral vascular accident) ([place]) 05/30/2020   Orthostatic hypotension 01/26/2020   Essential hypertension 01/26/2020   Acute respiratory failure ([place]) 10/19/2019   Colon polyp 10/19/2019   Orthostatic dizziness 10/19/2019   Hypothyroidism 10/19/2019   Rapid atrial fibrillation ([place]) 10/12/2019   Pneumonia 10/12/2019   Anemia requiring transfusions 10/12/2019   DNR (do not resuscitate) 10/12/2019   Acquired thrombophilia ([place]) 10/12/2019   Iron deficiency anemia 11/24/2017   Paroxysmal atrial fibrillation ([place]) 11/24/2017   Chronic depression 11/24/2017   GERD (gastroesophageal reflux disease) 11/24/2017   Breast cancer ([place]) 11/24/2017   Hypothyroid 11/24/2017   Dyslipidemia 11/24/2017    Sumner Boast., PT 08/09/2020, 4:09 PM  [place]. [place], [place], [address] Phone: [phone]   Fax:  [phone]  Name: Katrina Randolph MRN: [id number] Date of Birth: 02-Dec-1930

## 2020-08-09 NOTE — Therapy (Signed)
Katrina Randolph, [place], [address] Phone: [phone]   Fax:  [phone]  Speech Language Pathology Treatment  Patient Details  Name: Katrina Randolph MRN: [id number] Date of Birth: 11-20-1930 Referring Provider (SLP): Jamse Arn MD   Encounter Date: 08/09/2020   End of Session - 08/09/20 1517     Visit Number 12    Number of Visits 17    Date for SLP Re-Evaluation 08/19/20    SLP Start Time 1324    SLP Stop Time  1529    SLP Time Calculation (min) 33 min    Activity Tolerance Patient tolerated treatment well             Past Medical History:  Diagnosis Date   Atrial fibrillation [facility])     Past Surgical History:  Procedure Laterality Date   ABDOMINAL HYSTERECTOMY     BIOPSY  10/19/2019   Procedure: BIOPSY;  Surgeon: [name], MD;  [facility];  Service: Gastroenterology;;   BREAST LUMPECTOMY     CHOLECYSTECTOMY     COLONOSCOPY WITH PROPOFOL N/A 10/19/2019   Procedure: COLONOSCOPY WITH PROPOFOL;  Surgeon: [name], MD;  [facility];  Service: Gastroenterology;  Laterality: N/A;   ESOPHAGOGASTRODUODENOSCOPY (EGD) WITH PROPOFOL N/A 10/19/2019   Procedure: ESOPHAGOGASTRODUODENOSCOPY (EGD) WITH PROPOFOL;  Surgeon: [name], MD;  [facility];  Service: Gastroenterology;  Laterality: N/A;   POLYPECTOMY  10/19/2019   Procedure: POLYPECTOMY;  Surgeon: [name], MD;  [facility];  Service: Gastroenterology;;    There were no vitals filed for this visit.   Subjective Assessment - 08/09/20 1458     Subjective Pt was late due to the train today. Reports nerve pain is worse.    Currently in Pain? No/denies    Pain Score 8     Pain Descriptors / Indicators Pins and needles    Pain Type Chronic pain    Pain Frequency Intermittent                   ADULT SLP TREATMENT - 08/09/20 1504       General  Information   Behavior/Cognition Alert;Pleasant mood      Treatment Provided   Treatment provided Cognitive-Linquistic      Cognitive-Linquistic Treatment   Treatment focused on Aphasia;Apraxia    Skilled Treatment Facilitated verbal and written expressive language with unscrambling words and divergent naming tasks with functional words. Pt was required to verbalize and write each word out. Required verbal cueing to slow down. modA required.      Assessment / Recommendations / Plan   Plan Continue with current plan of care      Progression Toward Goals   Progression toward goals Progressing toward goals                SLP Short Term Goals - 08/09/20 1520       SLP SHORT TERM GOAL #1   Title Pt will recall 3 word finding strategies to assist with finding words.    Time 1    Period Weeks    Status Achieved      SLP SHORT TERM GOAL #2   Title Pt will correct single words with phonemic paraphasias independently.    Time 1    Period Weeks    Status On-going              SLP Long Term Goals - 08/09/20 1520  SLP LONG TERM GOAL #1   Title Pt will produce <2 phonemic errors in simple sentence (<4 words).    Time 3    Period Weeks    Status Revised      SLP LONG TERM GOAL #2   Title Pt will name 4 semantic features of a given object.    Time 3    Period Weeks    Status On-going              Plan - 08/09/20 1519     Clinical Impression Statement More difficulty noted today with tasks. Pt reports significant nerve pain today and that it was hard to focus.    Speech Therapy Frequency 2x / week    Duration 8 weeks    Treatment/Interventions Cueing hierarchy;Functional tasks;Patient/family education;Compensatory strategies;Environmental controls;Multimodal communcation approach;Language facilitation;Compensatory techniques;Cognitive reorganization;SLP instruction and feedback;Internal/external aids    Potential to Achieve Goals Good    Consulted and  Agree with Plan of Care Patient             Patient will benefit from skilled therapeutic intervention in order to improve the following deficits and impairments:   Aphasia    Problem List Patient Active Problem List   Diagnosis Date Noted   Neuropathic pain    Dysesthesia    Prediabetes    Expressive aphasia    Atrial fibrillation ([place])    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   CVA (cerebral vascular accident) ([place]) 05/30/2020   Orthostatic hypotension 01/26/2020   Essential hypertension 01/26/2020   Acute respiratory failure ([place]) 10/19/2019   Colon polyp 10/19/2019   Orthostatic dizziness 10/19/2019   Hypothyroidism 10/19/2019   Rapid atrial fibrillation ([place]) 10/12/2019   Pneumonia 10/12/2019   Anemia requiring transfusions 10/12/2019   DNR (do not resuscitate) 10/12/2019   Acquired thrombophilia ([place]) 10/12/2019   Iron deficiency anemia 11/24/2017   Paroxysmal atrial fibrillation ([place]) 11/24/2017   Chronic depression 11/24/2017   GERD (gastroesophageal reflux disease) 11/24/2017   Breast cancer ([place]) 11/24/2017   Hypothyroid 11/24/2017   Dyslipidemia 11/24/2017    Katrina Auerbach [place]  MS, [place], CBIS  08/09/2020, 3:22 PM  [place]. [place], [place], [address] Phone: [phone]   Fax:  [phone]   Name: Katrina Randolph MRN: [id number] Date of Birth: 15-Dec-1930

## 2020-08-15 ENCOUNTER — Encounter: Payer: Self-pay | Admitting: Speech Pathology

## 2020-08-15 ENCOUNTER — Other Ambulatory Visit: Payer: Self-pay

## 2020-08-15 ENCOUNTER — Ambulatory Visit: Payer: Medicare Other | Attending: Physical Medicine & Rehabilitation

## 2020-08-15 ENCOUNTER — Ambulatory Visit: Payer: Medicare Other | Admitting: Speech Pathology

## 2020-08-15 DIAGNOSIS — I63512 Cerebral infarction due to unspecified occlusion or stenosis of left middle cerebral artery: Secondary | ICD-10-CM | POA: Insufficient documentation

## 2020-08-15 DIAGNOSIS — R4701 Aphasia: Secondary | ICD-10-CM | POA: Insufficient documentation

## 2020-08-15 DIAGNOSIS — R262 Difficulty in walking, not elsewhere classified: Secondary | ICD-10-CM | POA: Diagnosis present

## 2020-08-15 DIAGNOSIS — M6281 Muscle weakness (generalized): Secondary | ICD-10-CM | POA: Insufficient documentation

## 2020-08-15 DIAGNOSIS — R2681 Unsteadiness on feet: Secondary | ICD-10-CM | POA: Insufficient documentation

## 2020-08-15 DIAGNOSIS — R252 Cramp and spasm: Secondary | ICD-10-CM | POA: Diagnosis present

## 2020-08-15 DIAGNOSIS — R208 Other disturbances of skin sensation: Secondary | ICD-10-CM | POA: Insufficient documentation

## 2020-08-15 NOTE — Therapy (Signed)
[place]. [place], [place], [address] Phone: [phone]   Fax:  [phone]  Speech Language Pathology Treatment  Patient Details  Name: Katrina Randolph MRN: [id number] Date of Birth: 1930/06/14 Referring Provider (SLP): Jamse Arn MD   Encounter Date: 08/15/2020   End of Session - 08/15/20 1416     Visit Number 13    Number of Visits 17    Date for SLP Re-Evaluation 08/19/20    SLP Start Time 1353    SLP Stop Time  1438    SLP Time Calculation (min) 45 min    Activity Tolerance Patient tolerated treatment well             Past Medical History:  Diagnosis Date   Atrial fibrillation [facility])     Past Surgical History:  Procedure Laterality Date   ABDOMINAL HYSTERECTOMY     BIOPSY  10/19/2019   Procedure: BIOPSY;  Surgeon: [name], MD;  [facility];  Service: Gastroenterology;;   BREAST LUMPECTOMY     CHOLECYSTECTOMY     COLONOSCOPY WITH PROPOFOL N/A 10/19/2019   Procedure: COLONOSCOPY WITH PROPOFOL;  Surgeon: [name], MD;  [facility];  Service: Gastroenterology;  Laterality: N/A;   ESOPHAGOGASTRODUODENOSCOPY (EGD) WITH PROPOFOL N/A 10/19/2019   Procedure: ESOPHAGOGASTRODUODENOSCOPY (EGD) WITH PROPOFOL;  Surgeon: [name], MD;  [facility];  Service: Gastroenterology;  Laterality: N/A;   POLYPECTOMY  10/19/2019   Procedure: POLYPECTOMY;  Surgeon: [name], MD;  [facility];  Service: Gastroenterology;;    There were no vitals filed for this visit.   Subjective Assessment - 08/15/20 1400     Subjective pt reports she is to give therapy another month and then discharge.    Currently in Pain? Yes    Pain Score 4     Pain Location Head                   ADULT SLP TREATMENT - 08/15/20 1408       General Information   Behavior/Cognition Alert;Pleasant mood      Treatment Provided   Treatment  provided Cognitive-Linquistic      Cognitive-Linquistic Treatment   Treatment focused on Aphasia;Apraxia    Skilled Treatment Facilitated verbal and written expressive language with naming objects from descriptios task  Pt was required to verbalize and write each word out. Required verbal cueing to slow down and double check for errors. modA required.      Assessment / Recommendations / Plan   Plan Continue with current plan of care      Progression Toward Goals   Progression toward goals Progressing toward goals                SLP Short Term Goals - 08/15/20 1422       SLP SHORT TERM GOAL #1   Title Pt will recall 3 word finding strategies to assist with finding words.    Time 1    Period Weeks    Status Achieved      SLP SHORT TERM GOAL #2   Title Pt will correct single words with phonemic paraphasias independently.    Time 1    Period Weeks    Status On-going              SLP Long Term Goals - 08/15/20 1422       SLP LONG TERM GOAL #1   Title Pt will produce <2 phonemic  errors in simple sentence (<4 words).    Time 2    Period Weeks    Status Revised      SLP LONG TERM GOAL #2   Title Pt will name 4 semantic features of a given object.    Time 2    Period Weeks    Status On-going              Plan - 08/15/20 1417     Clinical Impression Statement Pt was able to complete tasks with min-to-mod A today. She was able to name objects with 100% acc. Continues to have difficulty spelling. Needs cues to slow.    Speech Therapy Frequency 2x / week    Duration 8 weeks    Treatment/Interventions Cueing hierarchy;Functional tasks;Patient/family education;Compensatory strategies;Environmental controls;Multimodal communcation approach;Language facilitation;Compensatory techniques;Cognitive reorganization;SLP instruction and feedback;Internal/external aids    Potential to Achieve Goals Good    Consulted and Agree with Plan of Care Patient              Patient will benefit from skilled therapeutic intervention in order to improve the following deficits and impairments:   Aphasia    Problem List Patient Active Problem List   Diagnosis Date Noted   Neuropathic pain    Dysesthesia    Prediabetes    Expressive aphasia    Atrial fibrillation ([place])    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   CVA (cerebral vascular accident) ([place]) 05/30/2020   Orthostatic hypotension 01/26/2020   Essential hypertension 01/26/2020   Acute respiratory failure ([place]) 10/19/2019   Colon polyp 10/19/2019   Orthostatic dizziness 10/19/2019   Hypothyroidism 10/19/2019   Rapid atrial fibrillation ([place]) 10/12/2019   Pneumonia 10/12/2019   Anemia requiring transfusions 10/12/2019   DNR (do not resuscitate) 10/12/2019   Acquired thrombophilia ([place]) 10/12/2019   Iron deficiency anemia 11/24/2017   Paroxysmal atrial fibrillation ([place]) 11/24/2017   Chronic depression 11/24/2017   GERD (gastroesophageal reflux disease) 11/24/2017   Breast cancer ([place]) 11/24/2017   Hypothyroid 11/24/2017   Dyslipidemia 11/24/2017    Rosann Randolph [place] MS, [place], CBIS  08/15/2020, 2:27 PM  [place]. [place], [place], [address] Phone: [phone]   Fax:  [phone]   Name: Katrina Randolph MRN: [id number] Date of Birth: November 06, 1930

## 2020-08-15 NOTE — Therapy (Signed)
[place]. [place], [place], [address] Phone: [phone]   Fax:  [phone]  Physical Therapy Treatment  Patient Details  Name: Katrina Randolph MRN: [id number] Date of Birth: 06-01-1930 Referring Provider (PT): Posey Pronto   Encounter Date: 08/15/2020   PT End of Session - 08/15/20 1308     Visit Number 13    Date for PT Re-Evaluation 08/21/20    Authorization Type UHC MCR- medical necessity    PT Start Time 1304    PT Stop Time 1345    PT Time Calculation (min) 41 min    Activity Tolerance Patient tolerated treatment well    Behavior During Therapy [facility] for tasks assessed/performed             Past Medical History:  Diagnosis Date   Atrial fibrillation [facility])     Past Surgical History:  Procedure Laterality Date   ABDOMINAL HYSTERECTOMY     BIOPSY  10/19/2019   Procedure: BIOPSY;  Surgeon: [name], MD;  [facility];  Service: Gastroenterology;;   BREAST LUMPECTOMY     CHOLECYSTECTOMY     COLONOSCOPY WITH PROPOFOL N/A 10/19/2019   Procedure: COLONOSCOPY WITH PROPOFOL;  Surgeon: [name], MD;  [facility];  Service: Gastroenterology;  Laterality: N/A;   ESOPHAGOGASTRODUODENOSCOPY (EGD) WITH PROPOFOL N/A 10/19/2019   Procedure: ESOPHAGOGASTRODUODENOSCOPY (EGD) WITH PROPOFOL;  Surgeon: [name], MD;  [facility];  Service: Gastroenterology;  Laterality: N/A;   POLYPECTOMY  10/19/2019   Procedure: POLYPECTOMY;  Surgeon: [name], MD;  [facility];  Service: Gastroenterology;;    There were no vitals filed for this visit.   Subjective Assessment - 08/15/20 1308     Subjective No pain today. Enters with FWW    Currently in Pain? No/denies                               [facility] Adult PT Treatment/Exercise - 08/15/20 0001       Ambulation/Gait   Gait Comments Gait with visual scanning for objects throughout  clinic - 2 x 110 feet without device with SBA      High Level Balance   High Level Balance Activities Side stepping;Backward walking;Turns;Tandem walking;Marching forwards    High Level Balance Comments ball toss on airex 10x2, cone  toe taps on the airex 10 x 2, marching opposite hand to knee 30 ft x 2 laps with sitting breaks in between d/t fatigue. small obstacle course: stepping over foam rolls, up/over 4" step and airex      Knee/Hip Exercises: Aerobic   Nustep L5 x 6 min                      PT Short Term Goals - 08/03/20 1353       PT SHORT TERM GOAL #1   Title Independent with initial HEP    Time 2    Period Weeks    Status Achieved               PT Long Term Goals - 08/09/20 1609       PT LONG TERM GOAL #1   Title Independent with advanced HEP    Status On-going                   Plan - 08/15/20 1309     Clinical Impression Statement Arrived 29minutes late to  todays session today. Good carryover of recommendaitons for use of FWW instead of SPC for safe functional mobility but did require some reinforcement for safety. Safety awareness still a bit limited with slower balance reactions on unstable surfaces. CGA to standing static and dynamic balance activities today but no c/o pain.    PT Next Visit Plan work on her balance and functional safety    Consulted and Agree with Plan of Care Patient             Patient will benefit from skilled therapeutic intervention in order to improve the following deficits and impairments:  Abnormal gait, Decreased endurance, Pain, Decreased balance, Decreased mobility, Decreased strength, Impaired sensation  Visit Diagnosis: Unsteadiness on feet  Muscle weakness (generalized)  Difficulty in walking, not elsewhere classified  Left middle cerebral artery stroke (HCC)  Cramp and spasm  Other disturbances of skin sensation     Problem List Patient Active Problem List   Diagnosis Date Noted    Neuropathic pain    Dysesthesia    Prediabetes    Expressive aphasia    Atrial fibrillation ([place])    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   CVA (cerebral vascular accident) ([place]) 05/30/2020   Orthostatic hypotension 01/26/2020   Essential hypertension 01/26/2020   Acute respiratory failure ([place]) 10/19/2019   Colon polyp 10/19/2019   Orthostatic dizziness 10/19/2019   Hypothyroidism 10/19/2019   Rapid atrial fibrillation ([place]) 10/12/2019   Pneumonia 10/12/2019   Anemia requiring transfusions 10/12/2019   DNR (do not resuscitate) 10/12/2019   Acquired thrombophilia ([place]) 10/12/2019   Iron deficiency anemia 11/24/2017   Paroxysmal atrial fibrillation ([place]) 11/24/2017   Chronic depression 11/24/2017   GERD (gastroesophageal reflux disease) 11/24/2017   Breast cancer ([place]) 11/24/2017   Hypothyroid 11/24/2017   Dyslipidemia 11/24/2017    Katrina Randolph, PT, DPT 08/15/2020, 1:47 PM  [place]. [place], [place], [address] Phone: [phone]   Fax:  [phone]  Name: Katrina Randolph MRN: [id number] Date of Birth: 10-18-1930

## 2020-08-17 ENCOUNTER — Ambulatory Visit: Payer: Medicare Other | Admitting: Speech Pathology

## 2020-08-17 ENCOUNTER — Ambulatory Visit: Payer: Medicare Other

## 2020-08-17 ENCOUNTER — Other Ambulatory Visit: Payer: Self-pay

## 2020-08-17 DIAGNOSIS — R4701 Aphasia: Secondary | ICD-10-CM

## 2020-08-17 DIAGNOSIS — I63512 Cerebral infarction due to unspecified occlusion or stenosis of left middle cerebral artery: Secondary | ICD-10-CM

## 2020-08-17 DIAGNOSIS — R2681 Unsteadiness on feet: Secondary | ICD-10-CM

## 2020-08-17 DIAGNOSIS — R262 Difficulty in walking, not elsewhere classified: Secondary | ICD-10-CM

## 2020-08-17 DIAGNOSIS — R208 Other disturbances of skin sensation: Secondary | ICD-10-CM

## 2020-08-17 DIAGNOSIS — R252 Cramp and spasm: Secondary | ICD-10-CM

## 2020-08-17 DIAGNOSIS — M6281 Muscle weakness (generalized): Secondary | ICD-10-CM

## 2020-08-17 NOTE — Therapy (Signed)
[place]. [place], [place], [address] Phone: [phone]   Fax:  [phone]  Physical Therapy Treatment  Patient Details  Name: Katrina Randolph MRN: [id number] Date of Birth: 1931-01-20 Referring Provider (PT): Posey Pronto   Encounter Date: 08/17/2020   PT End of Session - 08/17/20 1319     Visit Number 14    Date for PT Re-Evaluation 08/21/20    Authorization Type UHC MCR- medical necessity    PT Start Time 1304    PT Stop Time 1345    PT Time Calculation (min) 41 min    Activity Tolerance Patient tolerated treatment well    Behavior During Therapy [facility] for tasks assessed/performed             Past Medical History:  Diagnosis Date   Atrial fibrillation [facility])     Past Surgical History:  Procedure Laterality Date   ABDOMINAL HYSTERECTOMY     BIOPSY  10/19/2019   Procedure: BIOPSY;  Surgeon: [name], MD;  [facility];  Service: Gastroenterology;;   BREAST LUMPECTOMY     CHOLECYSTECTOMY     COLONOSCOPY WITH PROPOFOL N/A 10/19/2019   Procedure: COLONOSCOPY WITH PROPOFOL;  Surgeon: [name], MD;  [facility];  Service: Gastroenterology;  Laterality: N/A;   ESOPHAGOGASTRODUODENOSCOPY (EGD) WITH PROPOFOL N/A 10/19/2019   Procedure: ESOPHAGOGASTRODUODENOSCOPY (EGD) WITH PROPOFOL;  Surgeon: [name], MD;  [facility];  Service: Gastroenterology;  Laterality: N/A;   POLYPECTOMY  10/19/2019   Procedure: POLYPECTOMY;  Surgeon: [name], MD;  [facility];  Service: Gastroenterology;;    There were no vitals filed for this visit.   Subjective Assessment - 08/17/20 1318     Subjective some right knee pain. doing okay. no change. denies any falls at home. Feel more fatigue today    Currently in Pain? Yes    Pain Score 4     Pain Location Knee    Pain Orientation Right                               [facility] Adult PT  Treatment/Exercise - 08/17/20 0001       Ambulation/Gait   Gait Comments gait without AD around obstacles in the clinic, CS      High Level Balance   High Level Balance Activities Backward walking;Turns;Tandem walking;Marching forwards   CGA tandem   High Level Balance Comments step taps to 6" step from Airex 2 x10 sidestepping over foam beams in  bars      Knee/Hip Exercises: Aerobic   Nustep L5 x 6 min      Knee/Hip Exercises: Standing   Hip Abduction Both;2 sets;10 reps      Knee/Hip Exercises: Seated   Sit to Sand --   on airex: 5 x 2 - rest required d/t lightheadedness which resolved with short break                     PT Short Term Goals - 08/03/20 1353       PT SHORT TERM GOAL #1   Title Independent with initial HEP    Time 2    Period Weeks    Status Achieved               PT Long Term Goals - 08/09/20 1609       PT LONG TERM GOAL #1   Title Independent  with advanced HEP    Status On-going                   Plan - 08/17/20 1344     Clinical Impression Statement Katrina Randolph tolerated all interventions fairly but with much more rest needed today d/t fatigue. Continued gait practice for safety without AD with mild deviations noted. Difficulty with SLS persists. Cues needed for pacing with balance exercises. tandem continues to be very difficult requring CGA torughout to prevent falls with LOB.    PT Next Visit Plan work on her balance and functional safety    Consulted and Agree with Plan of Care Patient             Patient will benefit from skilled therapeutic intervention in order to improve the following deficits and impairments:  Abnormal gait, Decreased endurance, Pain, Decreased balance, Decreased mobility, Decreased strength, Impaired sensation  Visit Diagnosis: Aphasia  Unsteadiness on feet  Muscle weakness (generalized)  Difficulty in walking, not elsewhere classified  Left middle cerebral artery stroke  (HCC)  Cramp and spasm  Other disturbances of skin sensation     Problem List Patient Active Problem List   Diagnosis Date Noted   Neuropathic pain    Dysesthesia    Prediabetes    Expressive aphasia    Atrial fibrillation ([place])    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   CVA (cerebral vascular accident) ([place]) 05/30/2020   Orthostatic hypotension 01/26/2020   Essential hypertension 01/26/2020   Acute respiratory failure ([place]) 10/19/2019   Colon polyp 10/19/2019   Orthostatic dizziness 10/19/2019   Hypothyroidism 10/19/2019   Rapid atrial fibrillation ([place]) 10/12/2019   Pneumonia 10/12/2019   Anemia requiring transfusions 10/12/2019   DNR (do not resuscitate) 10/12/2019   Acquired thrombophilia ([place]) 10/12/2019   Iron deficiency anemia 11/24/2017   Paroxysmal atrial fibrillation ([place]) 11/24/2017   Chronic depression 11/24/2017   GERD (gastroesophageal reflux disease) 11/24/2017   Breast cancer ([place]) 11/24/2017   Hypothyroid 11/24/2017   Dyslipidemia 11/24/2017    Hall Busing, PT, DPT 08/17/2020, 2:16 PM  [place]. [place], [place], [address] Phone: [phone]   Fax:  [phone]  Name: Katrina Randolph MRN: [id number] Date of Birth: [birth year]

## 2020-08-17 NOTE — Therapy (Signed)
[place]. [place], [place], [address] Phone: [phone]   Fax:  [phone]  Speech Language Pathology Treatment  Patient Details  Name: Katrina Randolph MRN: [id number] Date of Birth: [birth year] Referring Provider (SLP): Jamse Arn MD   Encounter Date: 08/17/2020   End of Session - 08/17/20 1355     Visit Number 14    Number of Visits 17    Date for SLP Re-Evaluation 08/19/20    SLP Start Time 1347    SLP Stop Time  1430    SLP Time Calculation (min) 43 min    Activity Tolerance Patient tolerated treatment well             Past Medical History:  Diagnosis Date   Atrial fibrillation [facility])     Past Surgical History:  Procedure Laterality Date   ABDOMINAL HYSTERECTOMY     BIOPSY  10/19/2019   Procedure: BIOPSY;  Surgeon: [name], MD;  [facility];  Service: Gastroenterology;;   BREAST LUMPECTOMY     CHOLECYSTECTOMY     COLONOSCOPY WITH PROPOFOL N/A 10/19/2019   Procedure: COLONOSCOPY WITH PROPOFOL;  Surgeon: [name], MD;  [facility];  Service: Gastroenterology;  Laterality: N/A;   ESOPHAGOGASTRODUODENOSCOPY (EGD) WITH PROPOFOL N/A 10/19/2019   Procedure: ESOPHAGOGASTRODUODENOSCOPY (EGD) WITH PROPOFOL;  Surgeon: [name], MD;  [facility];  Service: Gastroenterology;  Laterality: N/A;   POLYPECTOMY  10/19/2019   Procedure: POLYPECTOMY;  Surgeon: [name], MD;  [facility];  Service: Gastroenterology;;    There were no vitals filed for this visit.   Subjective Assessment - 08/17/20 1353     Subjective Reports being frustrated with her leg/foot swelling.    Currently in Pain? Yes    Pain Score 5     Pain Location Leg    Pain Orientation Right;Left    Pain Descriptors / Indicators Pins and needles    Pain Type Chronic pain    Pain Frequency Intermittent                   ADULT SLP TREATMENT -  08/17/20 1431       General Information   Behavior/Cognition Alert;Pleasant mood      Treatment Provided   Treatment provided Cognitive-Linquistic      Cognitive-Linquistic Treatment   Treatment focused on Aphasia;Apraxia    Skilled Treatment Facilitated verbal and written expressive language with divergent naming task. Pt was required to verbalize and write each word out. Required verbal cueing to slow down and double check for errors. modA required. Pt was able to complete short sentences (4 word) independently without any spelling errors in 5 trials.      Assessment / Recommendations / Plan   Plan Continue with current plan of care      Progression Toward Goals   Progression toward goals Progressing toward goals                SLP Short Term Goals - 08/17/20 1415       SLP SHORT TERM GOAL #1   Title Pt will verbalize spelling of a single word before writing it down independently to slow rate and decrease errors.    Time 2    Period Weeks    Status New      SLP SHORT TERM GOAL #2   Title Pt will correct single words with phonemic paraphasias independently.    Time 1    Period  Weeks    Status Achieved              SLP Long Term Goals - 08/17/20 1419       SLP LONG TERM GOAL #1   Title Pt will produce <2 phonemic errors in simple sentence (<4 words).    Time 2    Period Weeks    Status Achieved      SLP LONG TERM GOAL #2   Title Pt will name 4 semantic features of a given object.    Time 2    Period Weeks    Status Achieved      SLP LONG TERM GOAL #3   Title Pt will independently correct errors in words, phrases, and/or short sentences (<4 words).    Time 4    Period Weeks    Status New              Plan - 08/17/20 1411     Clinical Impression Statement Pt has made improvement throughout her sessions. She continues to have difficulty spelling, but has made improvement overall. Her accuracy decreases has she becomes more tired. Her  frustration tolerance with herself is low which tends to create more dificulty.    Speech Therapy Frequency 2x / week    Duration 8 weeks    Treatment/Interventions Cueing hierarchy;Functional tasks;Patient/family education;Compensatory strategies;Environmental controls;Multimodal communcation approach;Language facilitation;Compensatory techniques;Cognitive reorganization;SLP instruction and feedback;Internal/external aids    Potential to Achieve Goals Good    Consulted and Agree with Plan of Care Patient             Patient will benefit from skilled therapeutic intervention in order to improve the following deficits and impairments:   Aphasia    Problem List Patient Active Problem List   Diagnosis Date Noted   Neuropathic pain    Dysesthesia    Prediabetes    Expressive aphasia    Atrial fibrillation ([place])    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   CVA (cerebral vascular accident) ([place]) 05/30/2020   Orthostatic hypotension 01/26/2020   Essential hypertension 01/26/2020   Acute respiratory failure ([place]) 10/19/2019   Colon polyp 10/19/2019   Orthostatic dizziness 10/19/2019   Hypothyroidism 10/19/2019   Rapid atrial fibrillation ([place]) 10/12/2019   Pneumonia 10/12/2019   Anemia requiring transfusions 10/12/2019   DNR (do not resuscitate) 10/12/2019   Acquired thrombophilia ([place]) 10/12/2019   Iron deficiency anemia 11/24/2017   Paroxysmal atrial fibrillation ([place]) 11/24/2017   Chronic depression 11/24/2017   GERD (gastroesophageal reflux disease) 11/24/2017   Breast cancer ([place]) 11/24/2017   Hypothyroid 11/24/2017   Dyslipidemia 11/24/2017    Rosann Auerbach Olivia MS, [place], CBIS  08/17/2020, 2:33 PM  [place]. [place], [place], [address] Phone: [phone]   Fax:  [phone]   Name: Katrina Randolph MRN: [id number] Date of Birth: [birth year]

## 2020-08-21 ENCOUNTER — Telehealth: Payer: Self-pay | Admitting: Neurology

## 2020-08-21 ENCOUNTER — Other Ambulatory Visit: Payer: Self-pay | Admitting: Neurology

## 2020-08-21 MED ORDER — DABIGATRAN ETEXILATE MESYLATE 150 MG PO CAPS: 150.0000 mg | ORAL_CAPSULE | Freq: Two times a day (BID) | ORAL | 3 refills | Status: DC

## 2020-08-21 NOTE — Telephone Encounter (Signed)
Returned daughters call.  Patient has stopped gabapentin due to swelling in bilateral ankles/feet and patient wasn't having any improvement with numbness/tingling.  Started on topamax, patient had a bad reaction with dizziness and low heart rate with hypotension.  Also, stopped Eliquis and will not restart taking it because it caused a rash.  Asking for different medications.    Okay to MyChart message response from Dr. Leonie Man.

## 2020-08-21 NOTE — Telephone Encounter (Signed)
Pt's daughter(on DPR) is asking for a call to discuss the nerve tingling pt is having on her right side, topomax not helping.  Daughter asking for a call to discuss another blood thinner because the apixaban (ELIQUIS) 5 MG TABS tablet is causing pt a rash, please call

## 2020-08-22 ENCOUNTER — Other Ambulatory Visit: Payer: Self-pay

## 2020-08-22 ENCOUNTER — Ambulatory Visit: Payer: Medicare Other | Admitting: Speech Pathology

## 2020-08-22 ENCOUNTER — Ambulatory Visit: Payer: Medicare Other

## 2020-08-22 ENCOUNTER — Encounter: Payer: Self-pay | Admitting: Speech Pathology

## 2020-08-22 DIAGNOSIS — R4701 Aphasia: Secondary | ICD-10-CM

## 2020-08-22 DIAGNOSIS — R2681 Unsteadiness on feet: Secondary | ICD-10-CM | POA: Diagnosis not present

## 2020-08-22 NOTE — Therapy (Signed)
[address]rina Randolph. [place], [place], [address] Phone: [phone]   Fax:  [phone]  Speech Language Pathology Treatment  Patient Details  Name: Katrina Randolph MRN: [id number] Date of Birth: April 30, 1930 Referring Provider (SLP): Jamse Arn MD   Encounter Date: 08/22/2020   End of Session - 08/22/20 1502     Visit Number 15    Number of Visits 4    Date for SLP Re-Evaluation 09/19/20    Authorization - Visit Number 1    Authorization - Number of Visits 4    SLP Start Time 1500   Pt late   SLP Stop Time  41    SLP Time Calculation (min) 40 min    Activity Tolerance Patient tolerated treatment well             Past Medical History:  Diagnosis Date   Atrial fibrillation [facility])     Past Surgical History:  Procedure Laterality Date   ABDOMINAL HYSTERECTOMY     BIOPSY  10/19/2019   Procedure: BIOPSY;  Surgeon: [name], MD;  [facility];  Service: Gastroenterology;;   BREAST LUMPECTOMY     CHOLECYSTECTOMY     COLONOSCOPY WITH PROPOFOL N/A 10/19/2019   Procedure: COLONOSCOPY WITH PROPOFOL;  Surgeon: [name], MD;  [facility];  Service: Gastroenterology;  Laterality: N/A;   ESOPHAGOGASTRODUODENOSCOPY (EGD) WITH PROPOFOL N/A 10/19/2019   Procedure: ESOPHAGOGASTRODUODENOSCOPY (EGD) WITH PROPOFOL;  Surgeon: [name], MD;  [facility];  Service: Gastroenterology;  Laterality: N/A;   POLYPECTOMY  10/19/2019   Procedure: POLYPECTOMY;  Surgeon: [name], MD;  [facility];  Service: Gastroenterology;;    There were no vitals filed for this visit.   Subjective Assessment - 08/22/20 1500     Subjective Pt reports she fell asleep and that is why she was late today.    Currently in Pain? Yes    Pain Score 4     Pain Location Leg    Pain Orientation Right    Pain Type Chronic pain                   ADULT SLP TREATMENT -  08/22/20 1509       General Information   Behavior/Cognition Alert;Pleasant mood      Treatment Provided   Treatment provided Cognitive-Linquistic      Cognitive-Linquistic Treatment   Treatment focused on Aphasia;Apraxia    Skilled Treatment Facilitated verbal and written language by having patient verbally generate a sentence and write it down next to the associated picture. required minA to look for errors in her words.      Assessment / Recommendations / Plan   Plan Continue with current plan of care      Progression Toward Goals   Progression toward goals Progressing toward goals                SLP Short Term Goals - 08/22/20 1519       SLP SHORT TERM GOAL #1   Title Pt will verbalize spelling of a single word before writing it down independently to slow rate and decrease errors.    Time 2    Period Weeks    Status On-going      SLP SHORT TERM GOAL #2   Title Pt will correct single words with phonemic paraphasias independently.    Time 1    Period Weeks    Status Achieved  SLP Long Term Goals - 08/22/20 1520       SLP LONG TERM GOAL #1   Title Pt will produce <2 phonemic errors in simple sentence (<4 words).    Time 2    Period Weeks    Status Achieved      SLP LONG TERM GOAL #2   Title Pt will name 4 semantic features of a given object.    Time 2    Period Weeks    Status Achieved      SLP LONG TERM GOAL #3   Title Pt will independently correct errors in words, phrases, and/or short sentences (<4 words).    Time 4    Period Weeks    Status On-going              Plan - 08/22/20 1513     Clinical Impression Statement Pt benefits from spelling words aloud prior to writing them down. Pt required prompting  x2 to use strategy, then used independenty when noting an error. Pt would benefit from x4 therapy sessions to solidify use of strategies prior to d/c from [place] services.    Speech Therapy Frequency 1x /week    Duration 4  weeks    Treatment/Interventions Cueing hierarchy;Functional tasks;Patient/family education;Compensatory strategies;Environmental controls;Multimodal communcation approach;Language facilitation;Compensatory techniques;Cognitive reorganization;SLP instruction and feedback;Internal/external aids    Potential to Achieve Goals Good    Consulted and Agree with Plan of Care Patient             Patient will benefit from skilled therapeutic intervention in order to improve the following deficits and impairments:   Aphasia    Problem List Patient Active Problem List   Diagnosis Date Noted   Neuropathic pain    Dysesthesia    Prediabetes    Expressive aphasia    Atrial fibrillation ([place])    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   CVA (cerebral vascular accident) ([place]) 05/30/2020   Orthostatic hypotension 01/26/2020   Essential hypertension 01/26/2020   Acute respiratory failure ([place]) 10/19/2019   Colon polyp 10/19/2019   Orthostatic dizziness 10/19/2019   Hypothyroidism 10/19/2019   Rapid atrial fibrillation ([place]) 10/12/2019   Pneumonia 10/12/2019   Anemia requiring transfusions 10/12/2019   DNR (do not resuscitate) 10/12/2019   Acquired thrombophilia ([place]) 10/12/2019   Iron deficiency anemia 11/24/2017   Paroxysmal atrial fibrillation ([place]) 11/24/2017   Chronic depression 11/24/2017   GERD (gastroesophageal reflux disease) 11/24/2017   Breast cancer ([place]) 11/24/2017   Hypothyroid 11/24/2017   Dyslipidemia 11/24/2017    Katrina Randolph, [place], CBIS  08/22/2020, 3:23 PM  [place]. [place], [place], [address] Phone: [phone]   Fax:  [phone]   Name: Katrina Randolph MRN: [id number] Date of Birth: April 04, 1930

## 2020-08-24 ENCOUNTER — Ambulatory Visit: Payer: Medicare Other

## 2020-08-24 ENCOUNTER — Ambulatory Visit: Payer: Medicare Other | Admitting: Speech Pathology

## 2020-08-28 ENCOUNTER — Encounter: Payer: Self-pay | Admitting: Speech Pathology

## 2020-08-28 ENCOUNTER — Encounter: Payer: Self-pay | Admitting: Physical Therapy

## 2020-08-28 ENCOUNTER — Ambulatory Visit: Payer: Medicare Other | Admitting: Speech Pathology

## 2020-08-28 ENCOUNTER — Ambulatory Visit: Payer: Medicare Other | Admitting: Physical Therapy

## 2020-08-28 ENCOUNTER — Other Ambulatory Visit: Payer: Self-pay

## 2020-08-28 DIAGNOSIS — R262 Difficulty in walking, not elsewhere classified: Secondary | ICD-10-CM

## 2020-08-28 DIAGNOSIS — J189 Pneumonia, unspecified organism: Secondary | ICD-10-CM | POA: Diagnosis not present

## 2020-08-28 DIAGNOSIS — I63512 Cerebral infarction due to unspecified occlusion or stenosis of left middle cerebral artery: Secondary | ICD-10-CM

## 2020-08-28 DIAGNOSIS — M6281 Muscle weakness (generalized): Secondary | ICD-10-CM

## 2020-08-28 DIAGNOSIS — R079 Chest pain, unspecified: Secondary | ICD-10-CM | POA: Diagnosis not present

## 2020-08-28 DIAGNOSIS — R2681 Unsteadiness on feet: Secondary | ICD-10-CM

## 2020-08-28 DIAGNOSIS — R4701 Aphasia: Secondary | ICD-10-CM

## 2020-08-28 NOTE — Therapy (Signed)
[place]. [place], [place], [address] Phone: [phone]   Fax:  [phone]  Physical Therapy Treatment  Patient Details  Name: Katrina Randolph MRN: [id number] Date of Birth: [birth year] Referring Provider (PT): Posey Pronto   Encounter Date: 08/28/2020   PT End of Session - 08/28/20 1527     Visit Number 15    Date for PT Re-Evaluation 09/21/20    Authorization Type UHC MCR- medical necessity    PT Start Time 2820    PT Stop Time 1527    PT Time Calculation (min) 42 min    Activity Tolerance Patient tolerated treatment well    Behavior During Therapy [facility] for tasks assessed/performed             Past Medical History:  Diagnosis Date   Atrial fibrillation [facility])     Past Surgical History:  Procedure Laterality Date   ABDOMINAL HYSTERECTOMY     BIOPSY  10/19/2019   Procedure: BIOPSY;  Surgeon: [name], MD;  Location: [facility];  Service: Gastroenterology;;   BREAST LUMPECTOMY     CHOLECYSTECTOMY     COLONOSCOPY WITH PROPOFOL N/A 10/19/2019   Procedure: COLONOSCOPY WITH PROPOFOL;  Surgeon: [name], MD;  [facility];  Service: Gastroenterology;  Laterality: N/A;   ESOPHAGOGASTRODUODENOSCOPY (EGD) WITH PROPOFOL N/A 10/19/2019   Procedure: ESOPHAGOGASTRODUODENOSCOPY (EGD) WITH PROPOFOL;  Surgeon: [name], MD;  [facility];  Service: Gastroenterology;  Laterality: N/A;   POLYPECTOMY  10/19/2019   Procedure: POLYPECTOMY;  Surgeon: [name], MD;  [facility];  Service: Gastroenterology;;    There were no vitals filed for this visit.   Subjective Assessment - 08/28/20 1450     Subjective Patient reports not feeling great today, she reports no falls.  Reports that she has not tried to do any shopping on her own.  She reports some worries about being able to get to a cart or an electric cart    Currently in Pain? No/denies                 [facility] PT Assessment - 08/28/20 0001       Assessment   Medical Diagnosis L CVA    Referring Provider (PT) Zara Chess Adult PT Treatment/Exercise - 08/28/20 0001       Ambulation/Gait   Gait Comments no device, walking in clinic, also up and down stairs.      High Level Balance   High Level Balance Activities Negotiating over obstacles    High Level Balance Comments step taps to 6" step from Airex 2 x10 sidestepping over foam beams in  bars, on airex ball toss, head turns and eyes closed                      PT Short Term Goals - 08/03/20 1353       PT SHORT TERM GOAL #1   Title Independent with initial HEP    Time 2    Period Weeks    Status Achieved               PT Long Term Goals - 08/28/20 1536       PT LONG TERM GOAL #1   Title Independent with advanced HEP    Status On-going  PT LONG TERM GOAL #2   Title Pt will be able to complete 5TSTS and TUG independent and without LOB in </= 12 seconds to demonstrate improved functional strength, gait speed, and balance.    Status Partially Met      PT LONG TERM GOAL #3   Title Pt will score at least 46/56 on the berg balance scale to demonstrate decreased falls risk    Status Achieved      PT LONG TERM GOAL #4   Title BLE strength at least 4+/5    Status Partially Met      PT LONG TERM GOAL #5   Title Pt will be able to ambulate indoor and outdoor uneven surfaces and negotiate obstacles with LRAD and no LOB to facilitate safety within home and community environments.    Status Partially Met                   Plan - 08/28/20 1532     Clinical Impression Statement Patient has continued to improve overall, she has less stumbling and loss of balance, she can use the cane at times and we are working on safety with no device.  We have continued to promote safety with her using the walker, During todays session she had difficulty with  higher level standing airex exercises had 2 LOB that required mod A to correct, with walking today without device had one or two times where she made some small shuffling steps to catch herself, no need for more than SBA from PT.  She is doing some cooking at home and reports that this week she was going to try to shop for groceries, she does have fear of making it to a cart or electric cart    PT Frequency 2x / week    PT Duration 4 weeks    PT Treatment/Interventions ADLs/Self Care Home Management;Cryotherapy;Electrical Stimulation;Moist Heat;Gait training;Neuromuscular re-education;Therapeutic exercise;Therapeutic activities;Functional mobility training;Patient/family education;Manual techniques;Energy conservation;Taping;Vestibular    PT Next Visit Plan try to maximize her function and her safety in the community    Consulted and Agree with Plan of Care Patient             Patient will benefit from skilled therapeutic intervention in order to improve the following deficits and impairments:  Decreased endurance, Pain, Decreased balance, Decreased mobility, Decreased strength, Impaired sensation, Abnormal gait  Visit Diagnosis: Unsteadiness on feet - Plan: PT plan of care cert/re-cert  Muscle weakness (generalized) - Plan: PT plan of care cert/re-cert  Difficulty in walking, not elsewhere classified - Plan: PT plan of care cert/re-cert  Left middle cerebral artery stroke ([place]) - Plan: PT plan of care cert/re-cert     Problem List Patient Active Problem List   Diagnosis Date Noted   Neuropathic pain    Dysesthesia    Prediabetes    Expressive aphasia    Atrial fibrillation ([place])    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) 10/19/2019   Colon polyp 10/19/2019   Orthostatic dizziness 10/19/2019    Hypothyroidism 10/19/2019   Rapid atrial fibrillation ([place]) 10/12/2019   Pneumonia 10/12/2019   Anemia requiring transfusions 10/12/2019   DNR (do not resuscitate) 10/12/2019   Acquired thrombophilia ([place]) 10/12/2019   Iron deficiency anemia 11/24/2017   Paroxysmal atrial fibrillation ([place]) 11/24/2017   Chronic depression 11/24/2017   GERD (gastroesophageal reflux disease)  11/24/2017   Breast cancer ([place]) 11/24/2017   Hypothyroid 11/24/2017   Dyslipidemia 11/24/2017    Sumner Boast., PT 08/28/2020, 3:39 PM  [place] [place]. [place], [place], [address] Phone: [phone]   Fax:  [phone]  Name: Anuja Manka MRN: [id number] Date of Birth: [birth year]

## 2020-08-28 NOTE — Therapy (Signed)
[place]. [place], [place], [address] Phone: [phone]   Fax:  [phone]  Speech Language Pathology Treatment  Patient Details  Name: Katrina Randolph MRN: [id number] Date of Birth: [birth year] Referring Provider (SLP): Jamse Arn MD   Encounter Date: 08/28/2020   End of Session - 08/28/20 1415     Visit Number 16    Date for SLP Re-Evaluation 09/19/20    Authorization - Visit Number 2    Authorization - Number of Visits 4    SLP Start Time 1400    SLP Stop Time  5573    SLP Time Calculation (min) 45 min    Activity Tolerance Patient tolerated treatment well             Past Medical History:  Diagnosis Date   Atrial fibrillation [facility])     Past Surgical History:  Procedure Laterality Date   ABDOMINAL HYSTERECTOMY     BIOPSY  10/19/2019   Procedure: BIOPSY;  Surgeon: [name], MD;  [facility];  Service: Gastroenterology;;   BREAST LUMPECTOMY     CHOLECYSTECTOMY     COLONOSCOPY WITH PROPOFOL N/A 10/19/2019   Procedure: COLONOSCOPY WITH PROPOFOL;  Surgeon: [name], MD;  [facility];  Service: Gastroenterology;  Laterality: N/A;   ESOPHAGOGASTRODUODENOSCOPY (EGD) WITH PROPOFOL N/A 10/19/2019   Procedure: ESOPHAGOGASTRODUODENOSCOPY (EGD) WITH PROPOFOL;  Surgeon: [name], MD;  [facility];  Service: Gastroenterology;  Laterality: N/A;   POLYPECTOMY  10/19/2019   Procedure: POLYPECTOMY;  Surgeon: [name], MD;  [facility];  Service: Gastroenterology;;    There were no vitals filed for this visit.          ADULT SLP TREATMENT - 08/28/20 1413       General Information   Behavior/Cognition Alert;Pleasant mood      Treatment Provided   Treatment provided Cognitive-Linquistic      Cognitive-Linquistic Treatment   Treatment focused on Aphasia;Apraxia    Skilled Treatment facilitated verbal and written  expression with deductive reasoning and spelling task. benefits from spelling word aloud prior to writing. Continues to require minA.      Assessment / Recommendations / Plan   Plan Continue with current plan of care      Progression Toward Goals   Progression toward goals Progressing toward goals                SLP Short Term Goals - 08/28/20 1412       SLP SHORT TERM GOAL #1   Title Pt will verbalize spelling of a single word before writing it down independently to slow rate and decrease errors.    Time 1    Period Weeks    Status On-going      SLP SHORT TERM GOAL #2   Title Pt will correct single words with phonemic paraphasias independently.    Time 1    Period Weeks    Status Achieved              SLP Long Term Goals - 08/28/20 1412       SLP LONG TERM GOAL #1   Title Pt will produce <2 phonemic errors in simple sentence (<4 words).    Time 2    Period Weeks    Status Achieved      SLP LONG TERM GOAL #2   Title Pt will name 4 semantic features of a given object.    Time 2  Period Weeks    Status Achieved      SLP LONG TERM GOAL #3   Title Pt will independently correct errors in words, phrases, and/or short sentences (<4 words).    Time 2    Period Weeks    Status On-going              Plan - 08/28/20 1415     Clinical Impression Statement Pt benefits from spelling words aloud prior to writing them down. Pt required prompting  x2 to use strategy, then used independenty when noting an error.    Speech Therapy Frequency 1x /week    Duration 4 weeks    Treatment/Interventions Cueing hierarchy;Functional tasks;Patient/family education;Compensatory strategies;Environmental controls;Multimodal communcation approach;Language facilitation;Compensatory techniques;Cognitive reorganization;SLP instruction and feedback;Internal/external aids    Potential to Achieve Goals Good    Consulted and Agree with Plan of Care Patient             Patient  will benefit from skilled therapeutic intervention in order to improve the following deficits and impairments:   Aphasia    Problem List Patient Active Problem List   Diagnosis Date Noted   Neuropathic pain    Dysesthesia    Prediabetes    Expressive aphasia    Atrial fibrillation ([place])    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   CVA (cerebral vascular accident) ([place]) 05/30/2020   Orthostatic hypotension 01/26/2020   Essential hypertension 01/26/2020   Acute respiratory failure ([place]) 10/19/2019   Colon polyp 10/19/2019   Orthostatic dizziness 10/19/2019   Hypothyroidism 10/19/2019   Rapid atrial fibrillation ([place]) 10/12/2019   Pneumonia 10/12/2019   Anemia requiring transfusions 10/12/2019   DNR (do not resuscitate) 10/12/2019   Acquired thrombophilia ([place]) 10/12/2019   Iron deficiency anemia 11/24/2017   Paroxysmal atrial fibrillation ([place]) 11/24/2017   Chronic depression 11/24/2017   GERD (gastroesophageal reflux disease) 11/24/2017   Breast cancer ([place]) 11/24/2017   Hypothyroid 11/24/2017   Dyslipidemia 11/24/2017    Rosann Auerbach [place] MS, [place], CBIS  08/28/2020, 2:17 PM  [address]. [place], [place], [address] Phone: [phone]   Fax:  [phone]   Name: Katrina Randolph MRN: [id number] Date of Birth: 02/20/1930

## 2020-08-29 ENCOUNTER — Ambulatory Visit: Payer: Medicare Other | Admitting: Speech Pathology

## 2020-08-29 ENCOUNTER — Ambulatory Visit: Payer: Medicare Other

## 2020-08-31 ENCOUNTER — Inpatient Hospital Stay (HOSPITAL_COMMUNITY)
Admission: EM | Admit: 2020-08-31 | Discharge: 2020-09-03 | DRG: 194 | Disposition: A | Discharge status: Home / Self Care | Payer: Medicare Other | Attending: Internal Medicine | Admitting: Internal Medicine

## 2020-08-31 ENCOUNTER — Emergency Department (HOSPITAL_COMMUNITY): Payer: Medicare Other

## 2020-08-31 ENCOUNTER — Other Ambulatory Visit: Payer: Self-pay

## 2020-08-31 ENCOUNTER — Encounter (HOSPITAL_COMMUNITY): Payer: Self-pay

## 2020-08-31 DIAGNOSIS — I482 Chronic atrial fibrillation, unspecified: Secondary | ICD-10-CM | POA: Diagnosis present

## 2020-08-31 DIAGNOSIS — E785 Hyperlipidemia, unspecified: Secondary | ICD-10-CM | POA: Diagnosis present

## 2020-08-31 DIAGNOSIS — D649 Anemia, unspecified: Secondary | ICD-10-CM | POA: Diagnosis present

## 2020-08-31 DIAGNOSIS — Z9049 Acquired absence of other specified parts of digestive tract: Secondary | ICD-10-CM | POA: Diagnosis not present

## 2020-08-31 DIAGNOSIS — Z20822 Contact with and (suspected) exposure to covid-19: Secondary | ICD-10-CM | POA: Diagnosis present

## 2020-08-31 DIAGNOSIS — Z7901 Long term (current) use of anticoagulants: Secondary | ICD-10-CM | POA: Diagnosis not present

## 2020-08-31 DIAGNOSIS — Z79899 Other long term (current) drug therapy: Secondary | ICD-10-CM

## 2020-08-31 DIAGNOSIS — Z66 Do not resuscitate: Secondary | ICD-10-CM | POA: Diagnosis present

## 2020-08-31 DIAGNOSIS — Z90710 Acquired absence of both cervix and uterus: Secondary | ICD-10-CM

## 2020-08-31 DIAGNOSIS — J189 Pneumonia, unspecified organism: Secondary | ICD-10-CM | POA: Diagnosis present

## 2020-08-31 DIAGNOSIS — Z8673 Personal history of transient ischemic attack (TIA), and cerebral infarction without residual deficits: Secondary | ICD-10-CM | POA: Diagnosis not present

## 2020-08-31 DIAGNOSIS — R079 Chest pain, unspecified: Secondary | ICD-10-CM | POA: Diagnosis present

## 2020-08-31 DIAGNOSIS — Z882 Allergy status to sulfonamides status: Secondary | ICD-10-CM

## 2020-08-31 DIAGNOSIS — Z6831 Body mass index (BMI) 31.0-31.9, adult: Secondary | ICD-10-CM | POA: Diagnosis not present

## 2020-08-31 DIAGNOSIS — K219 Gastro-esophageal reflux disease without esophagitis: Secondary | ICD-10-CM | POA: Diagnosis present

## 2020-08-31 DIAGNOSIS — I4891 Unspecified atrial fibrillation: Secondary | ICD-10-CM | POA: Diagnosis not present

## 2020-08-31 DIAGNOSIS — E039 Hypothyroidism, unspecified: Secondary | ICD-10-CM | POA: Diagnosis present

## 2020-08-31 DIAGNOSIS — R0902 Hypoxemia: Secondary | ICD-10-CM

## 2020-08-31 DIAGNOSIS — Z87891 Personal history of nicotine dependence: Secondary | ICD-10-CM | POA: Diagnosis not present

## 2020-08-31 DIAGNOSIS — Z79890 Hormone replacement therapy (postmenopausal): Secondary | ICD-10-CM | POA: Diagnosis not present

## 2020-08-31 LAB — CBC WITH DIFFERENTIAL/PLATELET
Abs Immature Granulocytes: 0.07 10*3/uL (ref 0.00–0.07)
Basophils Absolute: 0 10*3/uL (ref 0.0–0.1)
Basophils Relative: 0 %
Eosinophils Absolute: 0 10*3/uL (ref 0.0–0.5)
Eosinophils Relative: 0 %
HCT: 40.3 % (ref 36.0–46.0)
Hemoglobin: 12.6 g/dL (ref 12.0–15.0)
Immature Granulocytes: 1 %
Lymphocytes Relative: 5 %
Lymphs Abs: 0.7 10*3/uL (ref 0.7–4.0)
MCH: 28.1 pg (ref 26.0–34.0)
MCHC: 31.3 g/dL (ref 30.0–36.0)
MCV: 89.8 fL (ref 80.0–100.0)
Monocytes Absolute: 1.2 10*3/uL — ABNORMAL HIGH (ref 0.1–1.0)
Monocytes Relative: 10 %
Neutro Abs: 10.6 10*3/uL — ABNORMAL HIGH (ref 1.7–7.7)
Neutrophils Relative %: 84 %
Platelets: 214 10*3/uL (ref 150–400)
RBC: 4.49 MIL/uL (ref 3.87–5.11)
RDW: 15.7 % — ABNORMAL HIGH (ref 11.5–15.5)
WBC: 12.6 10*3/uL — ABNORMAL HIGH (ref 4.0–10.5)
nRBC: 0 % (ref 0.0–0.2)

## 2020-08-31 LAB — COMPREHENSIVE METABOLIC PANEL
ALT: 12 U/L (ref 0–44)
AST: 17 U/L (ref 15–41)
Albumin: 3.7 g/dL (ref 3.5–5.0)
Alkaline Phosphatase: 61 U/L (ref 38–126)
Anion gap: 13 (ref 5–15)
BUN: 14 mg/dL (ref 8–23)
CO2: 21 mmol/L — ABNORMAL LOW (ref 22–32)
Calcium: 9.1 mg/dL (ref 8.9–10.3)
Chloride: 105 mmol/L (ref 98–111)
Creatinine, Ser: 0.66 mg/dL (ref 0.44–1.00)
GFR, Estimated: >60 mL/min (ref >60)
Glucose, Bld: 141 mg/dL — ABNORMAL HIGH (ref 70–99)
Potassium: 4.5 mmol/L (ref 3.5–5.1)
Sodium: 139 mmol/L (ref 135–145)
Total Bilirubin: 1.4 mg/dL — ABNORMAL HIGH (ref 0.3–1.2)
Total Protein: 7.5 g/dL (ref 6.5–8.1)

## 2020-08-31 LAB — LIPASE, BLOOD: Lipase: 27 U/L (ref 11–51)

## 2020-08-31 LAB — RESP PANEL BY RT-PCR (FLU A&B, COVID) ARPGX2
Influenza A by PCR: NEGATIVE
Influenza B by PCR: NEGATIVE
SARS Coronavirus 2 by RT PCR: NEGATIVE

## 2020-08-31 LAB — TROPONIN I (HIGH SENSITIVITY)
Troponin I (High Sensitivity): 7 ng/L (ref <18)
Troponin I (High Sensitivity): 7 ng/L (ref <18)

## 2020-08-31 MED ORDER — SODIUM CHLORIDE 0.9 % IV BOLUS
500.0000 mL | Freq: Once | INTRAVENOUS | Status: AC
  Administered 2020-08-31: 500 mL via INTRAVENOUS

## 2020-08-31 MED ORDER — DILTIAZEM HCL-DEXTROSE 125-5 MG/125ML-% IV SOLN (PREMIX)
5.0000 mg/h | INTRAVENOUS | Status: DC
Start: 2020-08-31 — End: 2020-09-02
  Administered 2020-08-31 – 2020-09-01 (×2): 5 mg/h via INTRAVENOUS
  Filled 2020-08-31 (×2): qty 125

## 2020-08-31 MED ORDER — DOXYCYCLINE HYCLATE 100 MG PO TABS
100.0000 mg | ORAL_TABLET | Freq: Two times a day (BID) | ORAL | Status: DC
  Administered 2020-09-01 – 2020-09-03 (×5): 100 mg via ORAL
  Filled 2020-08-31 (×6): qty 1

## 2020-08-31 MED ORDER — IOHEXOL 350 MG/ML SOLN
100.0000 mL | Freq: Once | INTRAVENOUS | Status: AC | PRN
  Administered 2020-08-31: 100 mL via INTRAVENOUS

## 2020-08-31 MED ORDER — MORPHINE SULFATE (PF) 4 MG/ML IV SOLN
4.0000 mg | Freq: Once | INTRAVENOUS | Status: AC
  Administered 2020-08-31: 4 mg via INTRAVENOUS
  Filled 2020-08-31: qty 1

## 2020-08-31 MED ORDER — MELATONIN 3 MG PO TABS
3.0000 mg | ORAL_TABLET | Freq: Once | ORAL | Status: AC
  Administered 2020-08-31: 3 mg via ORAL
  Filled 2020-08-31: qty 1

## 2020-08-31 MED ORDER — ONDANSETRON HCL 4 MG/2ML IJ SOLN: 4.0000 mg | Freq: Four times a day (QID) | INTRAMUSCULAR | Status: DC | PRN

## 2020-08-31 MED ORDER — DABIGATRAN ETEXILATE MESYLATE 150 MG PO CAPS
150.0000 mg | ORAL_CAPSULE | Freq: Two times a day (BID) | ORAL | Status: DC
  Administered 2020-08-31 – 2020-09-03 (×6): 150 mg via ORAL
  Filled 2020-08-31 (×7): qty 1

## 2020-08-31 MED ORDER — BENZONATATE 100 MG PO CAPS
100.0000 mg | ORAL_CAPSULE | Freq: Three times a day (TID) | ORAL | Status: DC
Start: 2020-08-31 — End: 2020-09-03
  Administered 2020-08-31 – 2020-09-03 (×9): 100 mg via ORAL
  Filled 2020-08-31 (×9): qty 1

## 2020-08-31 MED ORDER — SODIUM CHLORIDE 0.9 % IV SOLN
2.0000 g | INTRAVENOUS | Status: DC
  Administered 2020-09-01 – 2020-09-03 (×3): 2 g via INTRAVENOUS
  Filled 2020-08-31 (×2): qty 2
  Filled 2020-08-31: qty 20

## 2020-08-31 MED ORDER — METOPROLOL TARTRATE 25 MG PO TABS
25.0000 mg | ORAL_TABLET | Freq: Once | ORAL | Status: AC
  Administered 2020-08-31: 25 mg via ORAL
  Filled 2020-08-31: qty 1

## 2020-08-31 MED ORDER — KETOROLAC TROMETHAMINE 10 MG PO TABS
10.0000 mg | ORAL_TABLET | Freq: Three times a day (TID) | ORAL | Status: DC | PRN
  Administered 2020-08-31: 10 mg via ORAL
  Filled 2020-08-31 (×2): qty 1

## 2020-08-31 MED ORDER — SODIUM CHLORIDE 0.9 % IV SOLN
1.0000 g | Freq: Once | INTRAVENOUS | Status: AC
  Administered 2020-08-31: 1 g via INTRAVENOUS
  Filled 2020-08-31: qty 10

## 2020-08-31 MED ORDER — GUAIFENESIN ER 600 MG PO TB12
1200.0000 mg | ORAL_TABLET | Freq: Two times a day (BID) | ORAL | Status: DC
Start: 2020-08-31 — End: 2020-09-03
  Administered 2020-08-31 – 2020-09-03 (×7): 1200 mg via ORAL
  Filled 2020-08-31 (×7): qty 2

## 2020-08-31 MED ORDER — ACETAMINOPHEN 325 MG PO TABS
650.0000 mg | ORAL_TABLET | ORAL | Status: DC | PRN
  Filled 2020-08-31: qty 2

## 2020-08-31 MED ORDER — SODIUM CHLORIDE 0.9 % IV SOLN
500.0000 mg | Freq: Once | INTRAVENOUS | Status: AC
  Administered 2020-08-31: 500 mg via INTRAVENOUS
  Filled 2020-08-31: qty 500

## 2020-08-31 MED ORDER — ENSURE ENLIVE PO LIQD
237.0000 mL | Freq: Two times a day (BID) | ORAL | Status: DC
  Administered 2020-09-01 – 2020-09-03 (×5): 237 mL via ORAL

## 2020-08-31 MED ORDER — HYDROCODONE-ACETAMINOPHEN 7.5-325 MG PO TABS
1.0000 | ORAL_TABLET | Freq: Four times a day (QID) | ORAL | Status: DC | PRN
  Administered 2020-08-31 – 2020-09-02 (×5): 1 via ORAL
  Filled 2020-08-31 (×6): qty 1

## 2020-08-31 MED ORDER — ONDANSETRON HCL 4 MG/2ML IJ SOLN
4.0000 mg | Freq: Once | INTRAMUSCULAR | Status: AC
  Administered 2020-08-31: 4 mg via INTRAVENOUS
  Filled 2020-08-31: qty 2

## 2020-08-31 NOTE — ED Triage Notes (Signed)
Pt BIB by EMS due to having ABD pain. Per pt, sharo sudden pain in the RLQ quadrant started around yesterday evening. Pt received 93mcg of fentanyl enroute. Pt has hx of a.fib. Per EMS, HR was 100-150.

## 2020-08-31 NOTE — H&P (Addendum)
History and Physical    Katrina Randolph FIE:[id number] DOB: [birth year] DOA: 08/31/2020  PCP: [name], PA-C  Patient coming from: Home  Chief Complaint: right chest pain and cough  HPI: Katrina Randolph is a 87 y.o. female with medical history significant of a fib on anticoagulation, HLD, GERD. Presenting with right chest pain and cough. Her cough started 1 week ago. It was non-productive. She tried children's benadryl, but it didn't help. She had no sick contacts. She reports her chest pain began about 3 days ago. It's right side, stabbing, in a couple-minute episodes, and non-radiating. It's worsened with cough. Nothing makes it better. Her symptoms have seemed to grow in intensity last night, so she decided to come to the ED this morning. She denies any other aggravating or alleviating factors.    ED Course: CTA chest showed RLL PNA. Her EKG showed a fib. She ran rates into the 150s. She was started on rocephin and zithro. She was given her home metoprolol. TRH was called for admission.   Review of Systems:  Denies dyspnea, lightheadedness, dizziness, fever, N/V/D. Review of systems is otherwise negative for all not mentioned in HPI.   PMHx Past Medical History:  Diagnosis Date   Atrial fibrillation ([place])     PSHx Past Surgical History:  Procedure Laterality Date   ABDOMINAL HYSTERECTOMY     BIOPSY  10/19/2019   Procedure: BIOPSY;  Surgeon: [name], MD;  [facility];  Service: Gastroenterology;;   BREAST LUMPECTOMY     CHOLECYSTECTOMY     COLONOSCOPY WITH PROPOFOL N/A 10/19/2019   Procedure: COLONOSCOPY WITH PROPOFOL;  Surgeon: [name], MD;  [facility];  Service: Gastroenterology;  Laterality: N/A;   ESOPHAGOGASTRODUODENOSCOPY (EGD) WITH PROPOFOL N/A 10/19/2019   Procedure: ESOPHAGOGASTRODUODENOSCOPY (EGD) WITH PROPOFOL;  Surgeon: [name], MD;  [facility];  Service: Gastroenterology;  Laterality: N/A;    POLYPECTOMY  10/19/2019   Procedure: POLYPECTOMY;  Surgeon: [name], MD;  [facility];  Service: Gastroenterology;;    SocHx  reports that she has quit smoking. Her smoking use included cigarettes. She has never used smokeless tobacco. She reports current alcohol use. She reports that she does not use drugs.  Allergies  Allergen Reactions   Sulfa Antibiotics     "feels bad"    FamHx No family history on file.  Prior to Admission medications   Medication Sig Start Date End Date Taking? Authorizing Provider  acetaminophen (TYLENOL) 325 MG tablet Take 2 tablets (650 mg total) by mouth every 4 (four) hours as needed for mild pain (or temp > 37.5 C (99.5 F)). 06/13/20   Angiulli, Lavon Paganini, PA-C  atorvastatin (LIPITOR) 10 MG tablet Take 2 tablets (20 mg total) by mouth daily. 06/13/20   Angiulli, Lavon Paganini, PA-C  dabigatran (PRADAXA) 150 MG CAPS capsule Take 1 capsule (150 mg total) by mouth 2 (two) times daily. 08/21/20   Garvin Fila, MD  esomeprazole (NEXIUM) 20 MG capsule Take 1 capsule (20 mg total) by mouth daily. 06/13/20   Angiulli, Lavon Paganini, PA-C  furosemide (LASIX) 20 MG tablet Take 20 mg by mouth daily as needed for fluid.  10/03/19   [provider]  levothyroxine (SYNTHROID) 112 MCG tablet Take 1 tablet (112 mcg total) by mouth every morning. 06/13/20   Angiulli, Lavon Paganini, PA-C  metoprolol succinate (TOPROL-XL) 25 MG 24 hr tablet Take 1 tablet (25 mg total) by mouth daily. Take with or immediately following a meal. 06/13/20  Angiulli, Lavon Paganini, PA-C  sertraline (ZOLOFT) 50 MG tablet Take 1 tablet (50 mg total) by mouth daily. 06/13/20   Cathlyn Parsons, PA-C    Physical Exam: Vitals:   08/31/20 0630 08/31/20 0700 08/31/20 0802 08/31/20 0833  BP: 125/75 129/76 119/88 119/89  Pulse: (!) 159 (!) 116 (!) 130 (!) 119  Resp: (!) 25 19 18 18   Temp:      SpO2: 91% (!) 89% 97% 92%  Weight:      Height:        General: 87 y.o. female resting in bed in  NAD Eyes: PERRL, normal sclera ENMT: Nares patent w/o discharge, orophaynx clear, dentition normal, ears w/o discharge/lesions/ulcers Neck: Supple, trachea midline Cardiovascular: irregular, +S1, S2, no m/g/r, equal pulses throughout; reproducible right chest pain on exam Respiratory: CTABL, no w/r/r, normal WOB GI: BS+, NDNT, no masses noted, no organomegaly noted MSK: No e/c/c Skin: No rashes, bruises, ulcerations noted Neuro: A&O x 3, no focal deficits Psyc: Appropriate interaction and affect, calm/cooperative  Labs on Admission: I have personally reviewed following labs and imaging studies  CBC: Recent Labs  Lab 08/31/20 0607  WBC 12.6*  NEUTROABS 10.6*  HGB 12.6  HCT 40.3  MCV 89.8  PLT 956   Basic Metabolic Panel: Recent Labs  Lab 08/31/20 0607  NA 139  K 4.5  CL 105  CO2 21*  GLUCOSE 141*  BUN 14  CREATININE 0.66  CALCIUM 9.1   GFR: Estimated Creatinine Clearance: 45.6 mL/min (by C-G formula based on SCr of 0.66 mg/dL). Liver Function Tests: Recent Labs  Lab 08/31/20 0607  AST 17  ALT 12  ALKPHOS 61  BILITOT 1.4*  PROT 7.5  ALBUMIN 3.7   Recent Labs  Lab 08/31/20 0736  LIPASE 27   No results for input(s): AMMONIA in the last 168 hours. Coagulation Profile: No results for input(s): INR, PROTIME in the last 168 hours. Cardiac Enzymes: No results for input(s): CKTOTAL, CKMB, CKMBINDEX, TROPONINI in the last 168 hours. BNP (last 3 results) No results for input(s): PROBNP in the last 8760 hours. HbA1C: No results for input(s): HGBA1C in the last 72 hours. CBG: No results for input(s): GLUCAP in the last 168 hours. Lipid Profile: No results for input(s): CHOL, HDL, LDLCALC, TRIG, CHOLHDL, LDLDIRECT in the last 72 hours. Thyroid Function Tests: No results for input(s): TSH, T4TOTAL, FREET4, T3FREE, THYROIDAB in the last 72 hours. Anemia Panel: No results for input(s): VITAMINB12, FOLATE, FERRITIN, TIBC, IRON, RETICCTPCT in the last 72  hours. Urine analysis:    Component Value Date/Time   COLORURINE AMBER (A) 05/30/2020 1332   APPEARANCEUR CLEAR 05/30/2020 1332   LABSPEC 1.023 05/30/2020 1332   PHURINE 5.0 05/30/2020 1332   GLUCOSEU NEGATIVE 05/30/2020 1332   HGBUR SMALL (A) 05/30/2020 1332   BILIRUBINUR NEGATIVE 05/30/2020 1332   KETONESUR 5 (A) 05/30/2020 1332   PROTEINUR 30 (A) 05/30/2020 1332   NITRITE NEGATIVE 05/30/2020 1332   LEUKOCYTESUR NEGATIVE 05/30/2020 1332    Radiological Exams on Admission: CT Angio Chest/Abd/Pel for Dissection W and/or Wo Contrast  Result Date: 08/31/2020 CLINICAL DATA:  Sudden onset sharp abdominal pain, initial encounter EXAM: CT ANGIOGRAPHY CHEST, ABDOMEN AND PELVIS TECHNIQUE: Non-contrast CT of the chest was initially obtained. Multidetector CT imaging through the chest, abdomen and pelvis was performed using the standard protocol during bolus administration of intravenous contrast. Multiplanar reconstructed images and MIPs were obtained and reviewed to evaluate the vascular anatomy. CONTRAST:  184mL OMNIPAQUE IOHEXOL 350 MG/ML SOLN COMPARISON:  None. FINDINGS: CTA CHEST FINDINGS Cardiovascular: Initial precontrast images of the thoracic aorta show atherosclerotic calcification without hyperdense crescent to suggest acute aortic injury. Post-contrast images demonstrate no evidence of aneurysmal dilatation or dissection. Cardiac enlargement is seen. No significant coronary calcifications are noted. The pulmonary artery as visualized is within normal limits. Mediastinum/Nodes: Thoracic inlet is within normal limits. Scattered mediastinal lymph nodes are seen. Right paratracheal lymph node measures 11 mm in short axis. Pre tracheal node measures 13 mm in short axis. Scattered smaller nodes are seen. These are likely reactive in nature given changes in the lung parenchyma. The esophagus as visualized is within normal limits. Lungs/Pleura: The lungs are well aerated bilaterally. Small right-sided  pleural effusion is noted as well as bibasilar atelectasis. More focal right lower lobe consolidation is noted anterolaterally. Scattered calcified granulomas are noted no other focal abnormality is noted. Musculoskeletal: Degenerative changes of the thoracic spine are seen. No acute rib abnormality is noted. No compression deformities are noted. Sternum is within normal limits. Review of the MIP images confirms the above findings. CTA ABDOMEN AND PELVIS FINDINGS VASCULAR Aorta: Abdominal aorta demonstrates atherosclerotic calcifications without aneurysmal dilatation or dissection. Celiac: The celiac axis demonstrates mild soft atherosclerotic plaque at its origin with mild poststenotic dilatation. The more distal branches are within normal limits. SMA: Patent without evidence of aneurysm, dissection, vasculitis or significant stenosis. Renals: Both renal arteries are patent without evidence of aneurysm, dissection, vasculitis, fibromuscular dysplasia or significant stenosis. IMA: Patent without evidence of aneurysm, dissection, vasculitis or significant stenosis. Iliacs: Iliacs demonstrate atherosclerotic calcifications without aneurysmal dilatation or dissection. Veins: No specific venous abnormality is noted at this time. Review of the MIP images confirms the above findings. NON-VASCULAR Hepatobiliary: Gallbladder has been surgically removed. Common bile duct is enlarged in the compensatory fashion. Liver is unremarkable. Pancreas: Unremarkable. No pancreatic ductal dilatation or surrounding inflammatory changes. Spleen: Normal in size without focal abnormality. Adrenals/Urinary Tract: Adrenal glands are unremarkable. Kidneys demonstrate a normal enhancement pattern bilaterally. No definitive renal calculi or urinary tract obstructive changes are seen. The bladder is partially distended. Stomach/Bowel: Diverticular change of the colon is noted particularly in the sigmoid colon although no findings to suggest  diverticulitis are noted. No obstructive or inflammatory changes are seen. The appendix is small but otherwise within normal limits. No inflammatory changes are seen. The small bowel is unremarkable. Moderate to large-sized hiatal hernia is noted with proximal half of the stomach within the chest cavity. Lymphatic: No sizable lymphadenopathy is noted. Reproductive: Status post hysterectomy. No adnexal masses. Other: No abdominal wall hernia or abnormality. No abdominopelvic ascites. Musculoskeletal: Degenerative changes of lumbar spine are noted. Review of the MIP images confirms the above findings. IMPRESSION: CTA of the chest: No aortic abnormality is noted. No pulmonary emboli are seen. Scattered mediastinal lymph nodes are seen likely reactive in nature. The lungs demonstrate bibasilar atelectasis as well as a small right-sided pleural effusion. More focal consolidation is noted in the anterolateral aspect of the right lower lobe. This is most consistent with a focal pneumonia however Followup PA and lateral chest X-ray is recommended in 3-4 weeks following trial of antibiotic therapy to ensure resolution and exclude underlying malignancy. CTA of the abdomen and pelvis: Atherosclerotic changes are noted without aneurysmal dilatation or dissection. Diverticulosis without diverticulitis. Hiatal hernia. Postsurgical changes as described. Aortic Atherosclerosis (ICD10-I70.0). Electronically Signed   By: Inez Catalina M.D.   On: 08/31/2020 08:20    EKG: Independently reviewed. A fib  Assessment/Plan A fib w/  RVR on anticoagulation     - admit to inpt, progressive-tele     - given home metoprolol but still running 130 - 150 during interview     - home metoprolol dosing has been change multiple times over the last year per daughter     - will start cardizem gtt     - continue pradaxa     - trp is negative; BP is good, CTA chest negative for clot; follow  RLL PNA Pleuritic chest pain     - as seen on CTA  chest     - CAP coverage w/ rocephin and doxy (d/t zithro interaction with pradaxa)  GERD     - PPI  HLD     - continue home statin when confirmed  Hypothyroidism     - continue home synthroid when confirmed  DVT prophylaxis: pradaxa  Code Status: DNR, confirmed w/ pt and family at bedside  Family Communication: w/ dtr at bedside  Consults called: none   Status is: Inpatient  Remains inpatient appropriate because:Inpatient level of care appropriate due to severity of illness  Dispo: The patient is from: Home              Anticipated d/c is to: Home              Patient currently is not medically stable to d/c.   Difficult to place patient No  Time spent coordinating admission: 60 minutes  [facility]  If 7PM-7AM, please contact night-coverage [email or web address]  08/31/2020, 9:21 AM

## 2020-08-31 NOTE — ED Provider Notes (Signed)
Patient signed out to me as pending additional imaging studies from prior physician.  Please see prior note for complete history and physical exam.  Work-up today is consistent with right lower lobe pneumonia likely the cause of her right upper quadrant pain.  Labs show white count 12.  Patient started on IV Rocephin and azithromycin after cultures have been drawn.  She presents in A. fib RVR but heart rate appears improved with interventions here.  Given oral metoprolol as well.  Admitted to the hospitalist team.  .Critical Care  Date/Time: 08/31/2020 9:07 AM Performed by: Luna Fuse, MD Authorized by: Luna Fuse, MD   Critical care provider statement:    Critical care time (minutes):  30   Critical care time was exclusive of:  Separately billable procedures and treating other patients   Critical care was necessary to treat or prevent imminent or life-threatening deterioration of the following conditions:  Cardiac failure and respiratory failure Comments:     A. fib, RVR.  Pneumonia with resultant hypoxemia requiring 2 L nasal cannula.    Luna Fuse, MD 09/01/20 1130

## 2020-08-31 NOTE — ED Notes (Signed)
Pt gone to CT unable to update vitals or obtain EKG at this time.

## 2020-08-31 NOTE — ED Notes (Signed)
Patient transported to CT 

## 2020-08-31 NOTE — ED Provider Notes (Signed)
[facility] DEPT Provider Note   CSN: [id number] Arrival date & time: 08/31/20  3536     History Chief Complaint  Patient presents with   Abdominal Pain    Katrina Randolph is a 87 y.o. female.  Patient is a 87 year old female with past medical history of atrial fibrillation, prior CVA, anemia, GERD, and prior cholecystectomy.  She presents with complaints of right upper quadrant/right lower chest pain.  This began earlier this evening and is worsening.  She describes a sharp, stabbing pain to the right anterior lower rib area.  This began in the absence of any injury or trauma.  Pain is intermittent and sharp.  She denies any shortness of breath, nausea, or diaphoresis.  She does report cough and congestion last week but this seemed to improve as the week went on.  She denies fevers or chills.  The history is provided by the patient.      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   CVA (cerebral vascular accident) ([place]) 05/30/2020   Orthostatic hypotension 01/26/2020   Essential hypertension 01/26/2020   Acute respiratory failure ([place]) 10/19/2019   Colon polyp 10/19/2019   Orthostatic dizziness 10/19/2019   Hypothyroidism 10/19/2019   Rapid atrial fibrillation ([place]) 10/12/2019   Pneumonia 10/12/2019   Anemia requiring transfusions 10/12/2019   DNR (do not resuscitate) 10/12/2019   Acquired thrombophilia ([place]) 10/12/2019   Iron deficiency anemia 11/24/2017   Paroxysmal atrial fibrillation ([place]) 11/24/2017   Chronic depression 11/24/2017   GERD (gastroesophageal reflux disease) 11/24/2017   Breast cancer ([place]) 11/24/2017   Hypothyroid 11/24/2017   Dyslipidemia 11/24/2017    Past Surgical History:   Procedure Laterality Date   ABDOMINAL HYSTERECTOMY     BIOPSY  10/19/2019   Procedure: BIOPSY;  Surgeon: [name], MD;  Location: [facility];  Service: Gastroenterology;;   BREAST LUMPECTOMY     CHOLECYSTECTOMY     COLONOSCOPY WITH PROPOFOL N/A 10/19/2019   Procedure: COLONOSCOPY WITH PROPOFOL;  Surgeon: [name], MD;  Location: [facility];  Service: Gastroenterology;  Laterality: N/A;   ESOPHAGOGASTRODUODENOSCOPY (EGD) WITH PROPOFOL N/A 10/19/2019   Procedure: ESOPHAGOGASTRODUODENOSCOPY (EGD) WITH PROPOFOL;  Surgeon: [name], MD;  Location: [facility];  Service: Gastroenterology;  Laterality: N/A;   POLYPECTOMY  10/19/2019   Procedure: POLYPECTOMY;  Surgeon: [name], MD;  Location: [facility];  Service: Gastroenterology;;     OB History   No obstetric history on file.     No family history on file.  Social History   Tobacco Use   Smoking status: Former    Types: Cigarettes   Smokeless tobacco: Never   Tobacco comments:    quit over 20 Years ago  Vaping Use   Vaping Use: Never used  Substance Use Topics   Alcohol use: Yes    Comment: occational   Drug use: Never    Home Medications Prior to Admission medications   Medication Sig Start Date End Date Taking? Authorizing Provider  acetaminophen (TYLENOL) 325 MG tablet Take 2 tablets (650 mg total) by mouth every 4 (four) hours as needed for mild pain (or temp > 37.5 C (99.5 F)). 06/13/20   Angiulli, Lavon Paganini, PA-C  atorvastatin (  LIPITOR) 10 MG tablet Take 2 tablets (20 mg total) by mouth daily. 06/13/20   Angiulli, Lavon Paganini, PA-C  dabigatran (PRADAXA) 150 MG CAPS capsule Take 1 capsule (150 mg total) by mouth 2 (two) times daily. 08/21/20   Garvin Fila, MD  esomeprazole (NEXIUM) 20 MG capsule Take 1 capsule (20 mg total) by mouth daily. 06/13/20   Angiulli, Lavon Paganini, PA-C  furosemide (LASIX) 20 MG tablet Take 20 mg by mouth daily as needed for fluid.  10/03/19   [provider]   levothyroxine (SYNTHROID) 112 MCG tablet Take 1 tablet (112 mcg total) by mouth every morning. 06/13/20   Angiulli, Lavon Paganini, PA-C  metoprolol succinate (TOPROL-XL) 25 MG 24 hr tablet Take 1 tablet (25 mg total) by mouth daily. Take with or immediately following a meal. 06/13/20   Angiulli, Lavon Paganini, PA-C  sertraline (ZOLOFT) 50 MG tablet Take 1 tablet (50 mg total) by mouth daily. 06/13/20   Angiulli, Lavon Paganini, PA-C    Allergies    Sulfa antibiotics  Review of Systems   Review of Systems  All other systems reviewed and are negative.  Physical Exam Updated Vital Signs BP (!) 119/97 (BP Location: Left Arm)   Pulse (!) 139   Temp 98.4 F (36.9 C)   Resp (!) 26   Ht 5\' 1"  (1.549 m)   Wt 76.7 kg   SpO2 95%   BMI 31.93 kg/m   Physical Exam Vitals and nursing note reviewed.  Constitutional:      General: She is not in acute distress.    Appearance: She is well-developed. She is not diaphoretic.  HENT:     Head: Normocephalic and atraumatic.  Cardiovascular:     Rate and Rhythm: Normal rate and regular rhythm.     Heart sounds: No murmur heard.   No friction rub. No gallop.  Pulmonary:     Effort: Pulmonary effort is normal. No respiratory distress.     Breath sounds: Normal breath sounds. No wheezing.  Abdominal:     General: Bowel sounds are normal. There is no distension.     Palpations: Abdomen is soft.     Tenderness: There is abdominal tenderness in the right upper quadrant.     Comments: There is tenderness to palpation in the right upper quadrant/right lower ribs.  This seems to reproduce her symptoms.  Musculoskeletal:        General: Normal range of motion.     Cervical back: Normal range of motion and neck supple.  Skin:    General: Skin is warm and dry.  Neurological:     General: No focal deficit present.     Mental Status: She is alert and oriented to person, place, and time.    ED Results / Procedures / Treatments   Labs (all labs ordered are listed, but  only abnormal results are displayed) Labs Reviewed - No data to display  EKG None  Radiology No results found.  Procedures Procedures   Medications Ordered in ED Medications  sodium chloride 0.9 % bolus 500 mL (has no administration in time range)  ondansetron (ZOFRAN) injection 4 mg (has no administration in time range)  morphine 4 MG/ML injection 4 mg (has no administration in time range)    ED Course  I have reviewed the triage vital signs and the nursing notes.  Pertinent labs & imaging results that were available during my care of the patient were reviewed by me and considered in my medical  decision making (see chart for details).    MDM Rules/Calculators/A&P  Patient presenting here with complaints of pain to the right upper quadrant/right lower chest.  She does describe recent URI symptoms, but this pain seems likely musculoskeletal.  Due to her age and ongoing discomfort, I feel as though a CTA of the chest, abdomen, and pelvis is indicated to rule out dissection or other serious pathology.  Care will be signed out to oncoming provider at shift change awaiting results of this study.  Final Clinical Impression(s) / ED Diagnoses Final diagnoses:  None    Rx / DC Orders ED Discharge Orders     None        Veryl Speak, MD 09/04/20 2307

## 2020-09-01 LAB — BASIC METABOLIC PANEL WITH GFR
Anion gap: 7 (ref 5–15)
BUN: 22 mg/dL (ref 8–23)
CO2: 20 mmol/L — ABNORMAL LOW (ref 22–32)
Calcium: 8.4 mg/dL — ABNORMAL LOW (ref 8.9–10.3)
Chloride: 109 mmol/L (ref 98–111)
Creatinine, Ser: 0.77 mg/dL (ref 0.44–1.00)
GFR, Estimated: >60 mL/min
Glucose, Bld: 116 mg/dL — ABNORMAL HIGH (ref 70–99)
Potassium: 4.2 mmol/L (ref 3.5–5.1)
Sodium: 136 mmol/L (ref 135–145)

## 2020-09-01 LAB — LIPID PANEL
Cholesterol: 134 mg/dL (ref 0–200)
HDL: 48 mg/dL
LDL Cholesterol: 73 mg/dL (ref 0–99)
Total CHOL/HDL Ratio: 2.8 ratio
Triglycerides: 65 mg/dL
VLDL: 13 mg/dL (ref 0–40)

## 2020-09-01 LAB — CBC
HCT: 36.5 % (ref 36.0–46.0)
Hemoglobin: 11.1 g/dL — ABNORMAL LOW (ref 12.0–15.0)
MCH: 27.4 pg (ref 26.0–34.0)
MCHC: 30.4 g/dL (ref 30.0–36.0)
MCV: 90.1 fL (ref 80.0–100.0)
Platelets: 180 10*3/uL (ref 150–400)
RBC: 4.05 MIL/uL (ref 3.87–5.11)
RDW: 15.8 % — ABNORMAL HIGH (ref 11.5–15.5)
WBC: 8.4 10*3/uL (ref 4.0–10.5)
nRBC: 0 % (ref 0.0–0.2)

## 2020-09-01 MED ORDER — PREDNISONE 20 MG PO TABS
20.0000 mg | ORAL_TABLET | Freq: Every day | ORAL | Status: DC
  Administered 2020-09-01 – 2020-09-02 (×2): 20 mg via ORAL
  Filled 2020-09-01 (×2): qty 1

## 2020-09-01 MED ORDER — METOPROLOL TARTRATE 25 MG PO TABS
12.5000 mg | ORAL_TABLET | Freq: Two times a day (BID) | ORAL | Status: DC
  Administered 2020-09-01 (×2): 12.5 mg via ORAL
  Filled 2020-09-01 (×2): qty 1

## 2020-09-01 MED ORDER — DILTIAZEM HCL 30 MG PO TABS
30.0000 mg | ORAL_TABLET | Freq: Four times a day (QID) | ORAL | Status: DC
  Administered 2020-09-01 – 2020-09-02 (×5): 30 mg via ORAL
  Filled 2020-09-01 (×5): qty 1

## 2020-09-01 NOTE — Progress Notes (Signed)
Nutrition Brief Note  Patient identified on the Malnutrition Screening Tool (MST) Report.  Wt Readings from Last 15 Encounters:  08/31/20 76.7 kg  07/25/20 76.7 kg  07/12/20 75.5 kg  06/05/20 74.7 kg  05/30/20 76.8 kg  02/16/20 76.5 kg  01/27/20 75.1 kg  10/28/19 76.7 kg  10/13/19 82 kg  01/14/18 78.4 kg  12/24/17 78.5 kg  12/03/17 78 kg  11/24/17 78.6 kg    Body mass index is 31.93 kg/m. Patient meets criteria for obesity based on current BMI. Weight has been stable since 10/28/19. Skin WDL.   Current diet order is Heart Healthy. She ate 100% of dinner last night (540 kcal and 28 grams protein). Ensure Enlive ordered BID starting today.   Labs and medications reviewed.   No nutrition interventions warranted at this time. If nutrition issues arise, please consult RD.       Jarome Matin, MS, RD, LDN, CNSC Inpatient Clinical Dietitian RD pager # available in [place]  After hours/weekend pager # available in [facility]

## 2020-09-01 NOTE — Progress Notes (Signed)
PROGRESS NOTE    Katrina Randolph  [id number] DOB: 1930/11/10 DOA: 08/31/2020 PCP: [name], PA-C   Chief Complaint  Patient presents with   Abdominal Pain   Brief Narrative: 87 year old female with A. fib on Pradaxa, HLD, GERD presents with right chest pain, cough.  Cough onset a week ago nonproductive did not improve with Benadryl, no sick contacts.  Started having chest pain x3 days prior to admission right-sided stabbing nonradiating and worsened with cough. She was seen in the ED underwent imaging with a CTA that showed right lower lobe pneumonia EKG showed A. fib with RVR in the 150s patient was given antibiotics placed on Cardizem drip and admitted.  Subjective: Complains of sore throat, ongoing cough Overnight afebrile, heart rate has improved on Cardizem drip On 2 L [place]  Assessment & Plan:  RLL pneumonia: Seen on CT chest continue ceftriaxone doxycycline.  Leukocytosis resolved.  Continue incentive spirometry supplemental oxygen encourage ambulation.  We will add prednisone 20 g x 5 days Recent Labs  Lab 08/31/20 0607 09/01/20 0434  WBC 12.6* 8.4    Right-sided chest pain likely from above.  Pleuritic chest pain.  Troponins serially negative.  CT chest reviewed.  A. fib with RVR: Normally on metoprolol and Pradaxa.  Rate controlled on Cardizem drip  at 5 mg- we will start oral Cardizem and wean off IV Cardizem.  cont metoprolol 12.5 mg twice daily, with holding parameters. Continue with Pradaxa  HLD continue statin Hypothyroidism continue Synthroid GERD continue PPI Morbid obesity BMI 31: Will benefit with weight loss healthy lifestyle. Diet Order             Diet Heart Room service appropriate? Yes; Fluid consistency: Thin  Diet effective now                   Patient's Body mass index is 31.93 kg/m. dVT prophylaxis:   Pradaxa Code Status:   Code Status: DNR  Family Communication: plan of care discussed with patient at bedside. Status is:  Inpatient Remains inpatient appropriate because:IV treatments appropriate due to intensity of illness or inability to take PO and Inpatient level of care appropriate due to severity of illness Dispo: The patient is from: Home              Anticipated d/c is to: Home              Patient currently is not medically stable to d/c.   Difficult to place patient No  Unresulted Labs (From admission, onward)    None      Medications reviewed:  Scheduled Meds:  benzonatate  100 mg Oral TID   dabigatran  150 mg Oral BID   doxycycline  100 mg Oral Q12H   feeding supplement  237 mL Oral BID BM   guaiFENesin  1,200 mg Oral BID   Continuous Infusions:  cefTRIAXone (ROCEPHIN)  IV     diltiazem (CARDIZEM) infusion 5 mg/hr (08/31/20 1109)   Consultants:see note  Procedures:see note Antimicrobials: Anti-infectives (From admission, onward)    Start     Dose/Rate Route Frequency Ordered Stop   09/01/20 1000  cefTRIAXone (ROCEPHIN) 2 g in sodium chloride 0.9 % 100 mL IVPB        2 g 200 mL/hr over 30 Minutes Intravenous Every 24 hours 08/31/20 1722     09/01/20 1000  doxycycline (VIBRA-TABS) tablet 100 mg        100 mg Oral Every 12 hours 08/31/20 1021  08/31/20 0845  cefTRIAXone (ROCEPHIN) 1 g in sodium chloride 0.9 % 100 mL IVPB        1 g 200 mL/hr over 30 Minutes Intravenous  Once 08/31/20 0839 08/31/20 1000   08/31/20 0845  azithromycin (ZITHROMAX) 500 mg in sodium chloride 0.9 % 250 mL IVPB        500 mg 250 mL/hr over 60 Minutes Intravenous  Once 08/31/20 [id number] 08/31/20 1129      Culture/Microbiology    Component Value Date/Time   SDES  08/31/2020 0920    BLOOD RIGHT ANTECUBITAL Performed at [facility], [place] [address] [place], [place] [address]    SPECREQUEST  08/31/2020 0920    BOTTLES DRAWN AEROBIC AND ANAEROBIC Blood Culture adequate volume Performed at [facility] [address] [place], [place] [address]    CULT  08/31/2020  0920    NO GROWTH < 24 HOURS Performed at [facility] [address], [place], [place] [address]    REPTSTATUS PENDING 08/31/2020 0920    Other culture-see note  Objective: Vitals: Today's Vitals   08/31/20 2200 08/31/20 2358 09/01/20 0200 09/01/20 0600  BP: 117/68  112/74 (!) 121/59  Pulse: 67  82 70  Resp: '19  19 19  '$ Temp: 97.7 F (36.5 C)  97.8 F (36.6 C) 97.9 F (36.6 C)  TempSrc: Oral  Oral Oral  SpO2: 96%  94%   Weight:      Height:      PainSc:  0-No pain      Intake/Output Summary (Last 24 hours) at 09/01/2020 0709 Last data filed at 09/01/2020 0448 Gross per 24 hour  Intake 193.54 ml  Output 110 ml  Net 83.54 ml   Filed Weights   08/31/20 0452  Weight: 76.7 kg   Weight change:   Intake/Output from previous day: 07/21 0701 - 07/22 0700 In: 193.5 [P.O.:120; I.V.:73.5] Out: 110 [Urine:110] Intake/Output this shift: No intake/output data recorded. Filed Weights   08/31/20 0452  Weight: 76.7 kg   Examination: General exam: AAOx 3, elderly female, not in acute distress  HEENT:Oral mucosa moist, Ear/Nose WNL grossly, dentition normal. Respiratory system: bilaterally diminished mild wheezing crackles with deep inspiration hoarseness of voice, no use of accessory muscle Cardiovascular system: S1 & S2 +, No JVD,. Gastrointestinal system: Abdomen soft, NT,ND, BS+ Nervous System:Alert, awake, moving extremities and grossly nonfocal Extremities: no edema, distal peripheral pulses palpable.  Skin: No rashes,no icterus. MSK: Normal muscle bulk,tone, power  Data Reviewed: I have personally reviewed following labs and imaging studies CBC: Recent Labs  Lab 08/31/20 0607 09/01/20 0434  WBC 12.6* 8.4  NEUTROABS 10.6*  --   HGB 12.6 11.1*  HCT 40.3 36.5  MCV 89.8 90.1  PLT 214 [id number]   Basic Metabolic Panel: Recent Labs  Lab 08/31/20 0607 09/01/20 0434  NA 139 136  K 4.5 4.2  CL 105 109  CO2 21* 20*  GLUCOSE 141* 116*  BUN 14 22  CREATININE  0.66 0.77  CALCIUM 9.1 8.4*   GFR: Estimated Creatinine Clearance: 45.6 mL/min (by C-G formula based on SCr of 0.77 mg/dL). Liver Function Tests: Recent Labs  Lab 08/31/20 0607  AST 17  ALT 12  ALKPHOS 61  BILITOT 1.4*  PROT 7.5  ALBUMIN 3.7   Recent Labs  Lab 08/31/20 0736  LIPASE 27   No results for input(s): AMMONIA in the last 168 hours. Coagulation Profile: No results for input(s): INR, PROTIME in the last 168  hours. Cardiac Enzymes: No results for input(s): CKTOTAL, CKMB, CKMBINDEX, TROPONINI in the last 168 hours. BNP (last 3 results) No results for input(s): PROBNP in the last 8760 hours. HbA1C: No results for input(s): HGBA1C in the last 72 hours. CBG: No results for input(s): GLUCAP in the last 168 hours. Lipid Profile: Recent Labs    09/01/20 0434  CHOL 134  HDL 48  LDLCALC 73  TRIG 65  CHOLHDL 2.8   Thyroid Function Tests: No results for input(s): TSH, T4TOTAL, FREET4, T3FREE, THYROIDAB in the last 72 hours. Anemia Panel: No results for input(s): VITAMINB12, FOLATE, FERRITIN, TIBC, IRON, RETICCTPCT in the last 72 hours. Sepsis Labs: No results for input(s): PROCALCITON, LATICACIDVEN in the last 168 hours.  Recent Results (from the past 240 hour(s))  Resp Panel by RT-PCR (Flu A&B, Covid) Nasopharyngeal Swab     Status: None   Collection Time: 08/31/20  7:57 AM   Specimen: Nasopharyngeal Swab; Nasopharyngeal(NP) swabs in vial transport medium  Result Value Ref Range Status   SARS Coronavirus 2 by RT PCR NEGATIVE NEGATIVE Final    Comment: (NOTE) SARS-CoV-2 target nucleic acids are NOT DETECTED.  The SARS-CoV-2 RNA is generally detectable in upper respiratory specimens during the acute phase of infection. The lowest concentration of SARS-CoV-2 viral copies this assay can detect is 138 copies/mL. A negative result does not preclude SARS-Cov-2 infection and should not be used as the sole basis for treatment or other patient management decisions. A  negative result may occur with  improper specimen collection/handling, submission of specimen other than nasopharyngeal swab, presence of viral mutation(s) within the areas targeted by this assay, and inadequate number of viral copies(<138 copies/mL). A negative result must be combined with clinical observations, patient history, and epidemiological information. The expected result is Negative.  Fact Sheet for Patients:  EntrepreneurPulse.com.au  Fact Sheet for Healthcare Providers:  IncredibleEmployment.be  This test is no t yet approved or cleared by the [place] FDA and  has been authorized for detection and/or diagnosis of SARS-CoV-2 by FDA under an Emergency Use Authorization (EUA). This EUA will remain  in effect (meaning this test can be used) for the duration of the COVID-19 declaration under Section 564(b)(1) of the Act, 21 U.S.C.section 360bbb-3(b)(1), unless the authorization is terminated  or revoked sooner.       Influenza A by PCR NEGATIVE NEGATIVE Final   Influenza B by PCR NEGATIVE NEGATIVE Final    Comment: (NOTE) The Xpert Xpress SARS-CoV-2/FLU/RSV plus assay is intended as an aid in the diagnosis of influenza from Nasopharyngeal swab specimens and should not be used as a sole basis for treatment. Nasal washings and aspirates are unacceptable for Xpert Xpress SARS-CoV-2/FLU/RSV testing.  Fact Sheet for Patients: EntrepreneurPulse.com.au  Fact Sheet for Healthcare Providers: IncredibleEmployment.be  This test is not yet approved or cleared by the [place] FDA and has been authorized for detection and/or diagnosis of SARS-CoV-2 by FDA under an Emergency Use Authorization (EUA). This EUA will remain in effect (meaning this test can be used) for the duration of the COVID-19 declaration under Section 564(b)(1) of the Act, 21 U.S.C. section 360bbb-3(b)(1), unless the authorization  is terminated or revoked.  Performed at [facility], [place] [address], [place], [place] [address]   Culture, blood (routine x 2)     Status: None (Preliminary result)   Collection Time: 08/31/20  9:15 AM   Specimen: BLOOD  Result Value Ref Range Status   Specimen Description   Final  BLOOD LEFT ANTECUBITAL Performed at [facility] [address], [place], [place] [address]    Special Requests   Final    BOTTLES DRAWN AEROBIC AND ANAEROBIC Blood Culture adequate volume Performed at [facility] [address], [place], [place] [address]    Culture   Final    NO GROWTH < 24 HOURS Performed at [facility] [address] [place], [place] [address]    Report Status PENDING  Incomplete  Culture, blood (routine x 2)     Status: None (Preliminary result)   Collection Time: 08/31/20  9:20 AM   Specimen: BLOOD  Result Value Ref Range Status   Specimen Description   Final    BLOOD RIGHT ANTECUBITAL Performed at [facility] [address], [place], [place] [address]    Special Requests   Final    BOTTLES DRAWN AEROBIC AND ANAEROBIC Blood Culture adequate volume Performed at [facility] [address], [place], [place] [address]    Culture   Final    NO GROWTH < 24 HOURS Performed at [facility] [address], [place], [place] [address]    Report Status PENDING  Incomplete     Radiology Studies: CT Angio Chest/Abd/Pel for Dissection W and/or Wo Contrast  Result Date: 08/31/2020 CLINICAL DATA:  Sudden onset sharp abdominal pain, initial encounter EXAM: CT ANGIOGRAPHY CHEST, ABDOMEN AND PELVIS TECHNIQUE: Non-contrast CT of the chest was initially obtained. Multidetector CT imaging through the chest, abdomen and pelvis was performed using the standard protocol during bolus administration of intravenous contrast. Multiplanar reconstructed images and MIPs were obtained  and reviewed to evaluate the vascular anatomy. CONTRAST:  115m OMNIPAQUE IOHEXOL 350 MG/ML SOLN COMPARISON:  None. FINDINGS: CTA CHEST FINDINGS Cardiovascular: Initial precontrast images of the thoracic aorta show atherosclerotic calcification without hyperdense crescent to suggest acute aortic injury. Post-contrast images demonstrate no evidence of aneurysmal dilatation or dissection. Cardiac enlargement is seen. No significant coronary calcifications are noted. The pulmonary artery as visualized is within normal limits. Mediastinum/Nodes: Thoracic inlet is within normal limits. Scattered mediastinal lymph nodes are seen. Right paratracheal lymph node measures 11 mm in short axis. Pre tracheal node measures 13 mm in short axis. Scattered smaller nodes are seen. These are likely reactive in nature given changes in the lung parenchyma. The esophagus as visualized is within normal limits. Lungs/Pleura: The lungs are well aerated bilaterally. Small right-sided pleural effusion is noted as well as bibasilar atelectasis. More focal right lower lobe consolidation is noted anterolaterally. Scattered calcified granulomas are noted no other focal abnormality is noted. Musculoskeletal: Degenerative changes of the thoracic spine are seen. No acute rib abnormality is noted. No compression deformities are noted. Sternum is within normal limits. Review of the MIP images confirms the above findings. CTA ABDOMEN AND PELVIS FINDINGS VASCULAR Aorta: Abdominal aorta demonstrates atherosclerotic calcifications without aneurysmal dilatation or dissection. Celiac: The celiac axis demonstrates mild soft atherosclerotic plaque at its origin with mild poststenotic dilatation. The more distal branches are within normal limits. SMA: Patent without evidence of aneurysm, dissection, vasculitis or significant stenosis. Renals: Both renal arteries are patent without evidence of aneurysm, dissection, vasculitis, fibromuscular dysplasia or  significant stenosis. IMA: Patent without evidence of aneurysm, dissection, vasculitis or significant stenosis. Iliacs: Iliacs demonstrate atherosclerotic calcifications without aneurysmal dilatation or dissection. Veins: No specific venous abnormality is noted at this time. Review of the MIP images confirms the above findings. NON-VASCULAR Hepatobiliary: Gallbladder has been  surgically removed. Common bile duct is enlarged in the compensatory fashion. Liver is unremarkable. Pancreas: Unremarkable. No pancreatic ductal dilatation or surrounding inflammatory changes. Spleen: Normal in size without focal abnormality. Adrenals/Urinary Tract: Adrenal glands are unremarkable. Kidneys demonstrate a normal enhancement pattern bilaterally. No definitive renal calculi or urinary tract obstructive changes are seen. The bladder is partially distended. Stomach/Bowel: Diverticular change of the colon is noted particularly in the sigmoid colon although no findings to suggest diverticulitis are noted. No obstructive or inflammatory changes are seen. The appendix is small but otherwise within normal limits. No inflammatory changes are seen. The small bowel is unremarkable. Moderate to large-sized hiatal hernia is noted with proximal half of the stomach within the chest cavity. Lymphatic: No sizable lymphadenopathy is noted. Reproductive: Status post hysterectomy. No adnexal masses. Other: No abdominal wall hernia or abnormality. No abdominopelvic ascites. Musculoskeletal: Degenerative changes of lumbar spine are noted. Review of the MIP images confirms the above findings. IMPRESSION: CTA of the chest: No aortic abnormality is noted. No pulmonary emboli are seen. Scattered mediastinal lymph nodes are seen likely reactive in nature. The lungs demonstrate bibasilar atelectasis as well as a small right-sided pleural effusion. More focal consolidation is noted in the anterolateral aspect of the right lower lobe. This is most consistent  with a focal pneumonia however Followup PA and lateral chest X-ray is recommended in 3-4 weeks following trial of antibiotic therapy to ensure resolution and exclude underlying malignancy. CTA of the abdomen and pelvis: Atherosclerotic changes are noted without aneurysmal dilatation or dissection. Diverticulosis without diverticulitis. Hiatal hernia. Postsurgical changes as described. Aortic Atherosclerosis (ICD10-I70.0). Electronically Signed   By: Inez Catalina M.D.   On: 08/31/2020 08:20     LOS: 1 day   Antonieta Pert, MD [facility]  09/01/2020, 7:09 AM

## 2020-09-02 DIAGNOSIS — Z7901 Long term (current) use of anticoagulants: Secondary | ICD-10-CM

## 2020-09-02 DIAGNOSIS — I4891 Unspecified atrial fibrillation: Secondary | ICD-10-CM

## 2020-09-02 DIAGNOSIS — J189 Pneumonia, unspecified organism: Principal | ICD-10-CM

## 2020-09-02 LAB — BASIC METABOLIC PANEL
Anion gap: 9 (ref 5–15)
BUN: 20 mg/dL (ref 8–23)
CO2: 20 mmol/L — ABNORMAL LOW (ref 22–32)
Calcium: 8.8 mg/dL — ABNORMAL LOW (ref 8.9–10.3)
Chloride: 108 mmol/L (ref 98–111)
Creatinine, Ser: 0.59 mg/dL (ref 0.44–1.00)
GFR, Estimated: >60 mL/min (ref >60)
Glucose, Bld: 154 mg/dL — ABNORMAL HIGH (ref 70–99)
Potassium: 4.3 mmol/L (ref 3.5–5.1)
Sodium: 137 mmol/L (ref 135–145)

## 2020-09-02 LAB — MAGNESIUM: Magnesium: 2 mg/dL (ref 1.7–2.4)

## 2020-09-02 MED ORDER — METOPROLOL TARTRATE 25 MG PO TABS: 12.5000 mg | ORAL_TABLET | Freq: Four times a day (QID) | ORAL | Status: DC | PRN

## 2020-09-02 MED ORDER — METOPROLOL TARTRATE 25 MG PO TABS
25.0000 mg | ORAL_TABLET | Freq: Two times a day (BID) | ORAL | Status: DC
  Administered 2020-09-02 – 2020-09-03 (×3): 25 mg via ORAL
  Filled 2020-09-02 (×3): qty 1

## 2020-09-02 NOTE — Progress Notes (Addendum)
PROGRESS NOTE    Katrina Randolph  [id number] DOB: 1930/05/13 DOA: 08/31/2020 PCP: [name], PA-C   Chief Complaint  Patient presents with   Abdominal Pain   Brief Narrative:  87 yo w/PMHx of chronic Atrial fibrillation recently restarted on anticoagulation who presents with cough and pleuritic chest pain. CTA Chest showed RLL PNA. She presented with AF in RVR to the 150s. She was started on diltiazem gtt.  Assessment & Plan:   Active Problems:   Atrial fibrillation with rapid ventricular response (HCC)  #RLL PNA; improving - seen on CTA chest - Ceftriaxone + doxycycline - not requiring O2, CTM - guaifenesin, tessalon pearls for symptomatic treatment - hold prednisone given lack of O2 req, nl WBC and HDS and given in elderly patient, increased risk of steroid  #AF w/RVR No longer in RVR this AM, likely RVR due to pneumonia. CHADSVASc - >5 - s/p diltiazem gtt - increase metoprolol to '25mg'$  BID, weaning off diltiazem - can use prn metop for exacerbation or switch to q6h metoprolol - C/w dabigatran  Chronic conditions: #GERD #HLD  #Hypothyroidism  - Not on treatment, CTM  #Anemia - appears improved from prior, has hx of anemia before which led to conversation regarding watchman  DVT prophylaxis:  dabigatran Code Status: DNR Family Communication: discussed w/patient Disposition:   Status is: Inpatient, downgrade to med/surg  Remains inpatient appropriate because: managing atrial fibrillation  Dispo: The patient is from: Home              Anticipated d/c is to: Home              Patient currently  Patient should be stable for discharge in AM   Difficult to place patient No       Consultants:  N/a  Procedures:  N/a  Antimicrobials: ceftrx and doxy  Subjective: Overnight, no acute events. HR improved, she denies sensing palpitations. Continues to have cough, looking forward to going home.  Objective: Vitals:   09/01/20 2200 09/01/20  2345 09/02/20 0623 09/02/20 1256  BP:  (!) 145/68 131/84 (!) 146/93  Pulse:    61  Resp:    18  Temp: (!) 97.1 F (36.2 C)   97.8 F (36.6 C)  TempSrc: Oral   Oral  SpO2:    93%  Weight:      Height:        Intake/Output Summary (Last 24 hours) at 09/02/2020 1606 Last data filed at 09/02/2020 1451 Gross per 24 hour  Intake 240 ml  Output 1450 ml  Net -1210 ml   Filed Weights   08/31/20 0452  Weight: 76.7 kg    Examination: General exam: Appears calm and comfortable  Respiratory system: +rhonchi, no resp distress, NL WOB Cardiovascular system: S1 & S2 heard, irregularly irregular. No pedal edema. Gastrointestinal system: Abdomen is nondistended, soft and nontender. No organomegaly or masses felt. Normal bowel sounds heard. Central nervous system: Alert , moves all ext Extremities: NL muscle tone Skin: No rashes, lesions or ulcers Psychiatry: Judgement and insight appear normal. Mood & affect appropriate.   Data Reviewed: I have personally reviewed following labs and imaging studies  CBC: Recent Labs  Lab 08/31/20 0607 09/01/20 0434  WBC 12.6* 8.4  NEUTROABS 10.6*  --   HGB 12.6 11.1*  HCT 40.3 36.5  MCV 89.8 90.1  PLT 214 [id number]    Basic Metabolic Panel: Recent Labs  Lab 08/31/20 0607 09/01/20 0434 09/02/20 0433  NA 139 136 137  K  4.5 4.2 4.3  CL 105 109 108  CO2 21* 20* 20*  GLUCOSE 141* 116* 154*  BUN '14 22 20  '$ CREATININE 0.66 0.77 0.59  CALCIUM 9.1 8.4* 8.8*  MG  --   --  2.0    GFR: Estimated Creatinine Clearance: 45.6 mL/min (by C-G formula based on SCr of 0.59 mg/dL).  Liver Function Tests: Recent Labs  Lab 08/31/20 0607  AST 17  ALT 12  ALKPHOS 61  BILITOT 1.4*  PROT 7.5  ALBUMIN 3.7    CBG: No results for input(s): GLUCAP in the last 168 hours.   Recent Results (from the past 240 hour(s))  Resp Panel by RT-PCR (Flu A&B, Covid) Nasopharyngeal Swab     Status: None   Collection Time: 08/31/20  7:57 AM   Specimen: Nasopharyngeal  Swab; Nasopharyngeal(NP) swabs in vial transport medium  Result Value Ref Range Status   SARS Coronavirus 2 by RT PCR NEGATIVE NEGATIVE Final    Comment: (NOTE) SARS-CoV-2 target nucleic acids are NOT DETECTED.  The SARS-CoV-2 RNA is generally detectable in upper respiratory specimens during the acute phase of infection. The lowest concentration of SARS-CoV-2 viral copies this assay can detect is 138 copies/mL. A negative result does not preclude SARS-Cov-2 infection and should not be used as the sole basis for treatment or other patient management decisions. A negative result may occur with  improper specimen collection/handling, submission of specimen other than nasopharyngeal swab, presence of viral mutation(s) within the areas targeted by this assay, and inadequate number of viral copies(<138 copies/mL). A negative result must be combined with clinical observations, patient history, and epidemiological information. The expected result is Negative.  Fact Sheet for Patients:  EntrepreneurPulse.com.au  Fact Sheet for Healthcare Providers:  IncredibleEmployment.be  This test is no t yet approved or cleared by the [place] FDA and  has been authorized for detection and/or diagnosis of SARS-CoV-2 by FDA under an Emergency Use Authorization (EUA). This EUA will remain  in effect (meaning this test can be used) for the duration of the COVID-19 declaration under Section 564(b)(1) of the Act, 21 U.S.C.section 360bbb-3(b)(1), unless the authorization is terminated  or revoked sooner.       Influenza A by PCR NEGATIVE NEGATIVE Final   Influenza B by PCR NEGATIVE NEGATIVE Final    Comment: (NOTE) The Xpert Xpress SARS-CoV-2/FLU/RSV plus assay is intended as an aid in the diagnosis of influenza from Nasopharyngeal swab specimens and should not be used as a sole basis for treatment. Nasal washings and aspirates are unacceptable for Xpert Xpress  SARS-CoV-2/FLU/RSV testing.  Fact Sheet for Patients: EntrepreneurPulse.com.au  Fact Sheet for Healthcare Providers: IncredibleEmployment.be  This test is not yet approved or cleared by the [place] FDA and has been authorized for detection and/or diagnosis of SARS-CoV-2 by FDA under an Emergency Use Authorization (EUA). This EUA will remain in effect (meaning this test can be used) for the duration of the COVID-19 declaration under Section 564(b)(1) of the Act, 21 U.S.C. section 360bbb-3(b)(1), unless the authorization is terminated or revoked.  Performed at [facility], [place] [address] [place], [place] [address]   Culture, blood (routine x 2)     Status: None (Preliminary result)   Collection Time: 08/31/20  9:15 AM   Specimen: BLOOD  Result Value Ref Range Status   Specimen Description   Final    BLOOD LEFT ANTECUBITAL Performed at [facility] [address] [place], [place] [address]    Special  Requests   Final    BOTTLES DRAWN AEROBIC AND ANAEROBIC Blood Culture adequate volume Performed at [facility] [address], [place], [place] [address]    Culture   Final    NO GROWTH 2 DAYS Performed at [facility] [address], [place], [place] [address]    Report Status PENDING  Incomplete  Culture, blood (routine x 2)     Status: None (Preliminary result)   Collection Time: 08/31/20  9:20 AM   Specimen: BLOOD  Result Value Ref Range Status   Specimen Description   Final    BLOOD RIGHT ANTECUBITAL Performed at [facility] [address], [place], [place] [address]    Special Requests   Final    BOTTLES DRAWN AEROBIC AND ANAEROBIC Blood Culture adequate volume Performed at [facility] [address], [place], [place] [address]    Culture   Final    NO GROWTH 2 DAYS Performed at [facility] [address], [place], [place] [address]    Report Status PENDING  Incomplete      Radiology Studies: No results found.   Scheduled Meds:  benzonatate  100 mg Oral TID   dabigatran  150 mg Oral BID   doxycycline  100 mg Oral Q12H   feeding supplement  237 mL Oral BID BM   guaiFENesin  1,200 mg Oral BID   metoprolol tartrate  25 mg Oral BID   predniSONE  20 mg Oral Q breakfast   Continuous Infusions:  cefTRIAXone (ROCEPHIN)  IV 2 g (09/02/20 0923)     LOS: 2 days    Cecille Rubin, MD, MPH [facility]   To contact the attending physician between 7A-7P or the covering provider during after hours 7P-7A, please log into the web site [email or web address] and access using universal [place] password for that web site. If you do not have the password, please call the hospital operator.  09/02/2020, 4:06 PM

## 2020-09-02 NOTE — Progress Notes (Signed)
Pt noted to be A&O x 4 and appropriate at the beginning of the shift. At 2345, primary RN for this pt came into pt's room to administer scheduled medication, and pt was noted to be increasingly anxious and suspicious. Pt did take her scheduled oral cardizem, but approximately 15 minutes later pt attempted to climb OOB and was increasingly agitated, shouting at RN and NT. Pt insisted on speaking to her daughter Santiago Glad, so primary RN called pt's daughter to help calm pt's fears. Pt was observed yelling into the phone that "they are trying to kill me", and "the [place] is here". Pt's daughter expressed concern, states to RN that this behavior is completely new for her mother, and requested to come up and stay with pt and help reassure her. Charge nurse stated it would be ok for pt's daughter to come, and pt informed that her daughter was on her way. Pt now resting quietly, on call provider notified of events.

## 2020-09-03 MED ORDER — LEVOTHYROXINE SODIUM 112 MCG PO TABS
112.0000 ug | ORAL_TABLET | Freq: Every day | ORAL | Status: DC
  Administered 2020-09-03: 112 ug via ORAL
  Filled 2020-09-03: qty 1

## 2020-09-03 MED ORDER — SERTRALINE HCL 50 MG PO TABS
50.0000 mg | ORAL_TABLET | Freq: Every day | ORAL | Status: DC
  Administered 2020-09-03: 50 mg via ORAL
  Filled 2020-09-03: qty 1

## 2020-09-03 MED ORDER — GUAIFENESIN ER 600 MG PO TB12: 600.0000 mg | ORAL_TABLET | Freq: Two times a day (BID) | ORAL | 0 refills | Status: AC

## 2020-09-03 MED ORDER — BENZONATATE 100 MG PO CAPS: 100.0000 mg | ORAL_CAPSULE | Freq: Three times a day (TID) | ORAL | 0 refills | Status: AC | PRN

## 2020-09-03 MED ORDER — ACETAMINOPHEN 325 MG PO TABS: 650.0000 mg | ORAL_TABLET | ORAL | Status: DC | PRN

## 2020-09-03 MED ORDER — CEFDINIR 300 MG PO CAPS: 300.0000 mg | ORAL_CAPSULE | Freq: Two times a day (BID) | ORAL | 0 refills | Status: AC

## 2020-09-03 MED ORDER — METOPROLOL SUCCINATE ER 50 MG PO TB24: 50.0000 mg | ORAL_TABLET | Freq: Every day | ORAL | 0 refills | Status: DC

## 2020-09-03 MED ORDER — PANTOPRAZOLE SODIUM 40 MG PO TBEC
40.0000 mg | DELAYED_RELEASE_TABLET | Freq: Every day | ORAL | Status: DC
  Administered 2020-09-03: 40 mg via ORAL
  Filled 2020-09-03: qty 1

## 2020-09-03 MED ORDER — LEVOTHYROXINE SODIUM 112 MCG PO TABS: 112.0000 ug | ORAL_TABLET | Freq: Every morning | ORAL | Status: DC

## 2020-09-03 MED ORDER — FUROSEMIDE 20 MG PO TABS: 20.0000 mg | ORAL_TABLET | Freq: Every day | ORAL | Status: DC | PRN

## 2020-09-03 MED ORDER — DOXYCYCLINE HYCLATE 100 MG PO TABS: 100.0000 mg | ORAL_TABLET | Freq: Two times a day (BID) | ORAL | 0 refills | Status: AC

## 2020-09-03 NOTE — Progress Notes (Signed)
Katrina Randolph to be D/C'd home per MD order. Discussed with the patient and daughter and all questions fully answered.  Skin clean, dry and intact without evidence of skin break down, no evidence of skin tears noted.  IV catheter discontinued intact. Site without signs and symptoms of complications. Dressing and pressure applied.  An After Visit Summary was printed and given to the patient.  Patient escorted via [place], and D/C home via private auto.  [name]  09/03/2020

## 2020-09-03 NOTE — Evaluation (Addendum)
Physical Therapy Evaluation Patient Details Name: Katrina Randolph MRN: [id number] DOB: 02-29-1930 Today's Date: 09/03/2020   History of Present Illness  87 yo w/PMHx of chronic Atrial fibrillation, posterior MCA CVA, HTN, anemia. presents with cough and pleuritic chest pain. CTA Chest showed RLL PNA.  admitted with pna and  AF in RVR to the 150s. She was started on diltiazem gtt.  Clinical Impression  Pt admitted with above diagnosis.  Pt mobilizing well however HR remains tachy 118-164 (156-164 briefly with amb). Unsure of pt baseline mental status, she is intermittently confused during PT session--not consistently oriented to place or situation--becomes easily aggravated by questions, has some word finding difficulty which is likely related to prior CVA.  Pt currently with functional limitations due to the deficits listed below (see PT Problem List). Pt will benefit from skilled PT to increase their independence and safety with mobility to allow discharge to the venue listed below.       Follow Up Recommendations Outpatient PT (continue OPPT)--may need incr supervision initially--?confusion    Equipment Recommendations  None recommended by PT    Recommendations for Other Services       Precautions / Restrictions Precautions Precautions: Fall Restrictions Weight Bearing Restrictions: No      Mobility  Bed Mobility Overal bed mobility: Modified Independent                  Transfers Overall transfer level: Needs assistance Equipment used: Rolling walker (2 wheeled) Transfers: Sit to/from Stand Sit to Stand: Supervision         General transfer comment: for safety only  Ambulation/Gait Ambulation/Gait assistance: Min guard;Supervision Gait Distance (Feet): 160 Feet Assistive device: Rolling walker (2 wheeled) Gait Pattern/deviations: Step-through pattern;Decreased stride length     General Gait Details: pt makes turns without warning, no overt LOB  however does not feel comfortable amb without RW yet  Stairs            Wheelchair Mobility    Modified Rankin (Stroke Patients Only)       Balance Overall balance assessment: Needs assistance Sitting-balance support: No upper extremity supported;Feet supported Sitting balance-Leahy Scale: Good       Standing balance-Leahy Scale: Fair                               Pertinent Vitals/Pain Pain Assessment: No/denies pain    Home Living Family/patient expects to be discharged to:: Private residence Living Arrangements: Alone Available Help at Discharge: Family;Available PRN/intermittently (dtr) Type of Home: House Home Access: Stairs to enter Entrance Stairs-Rails: None Entrance Stairs-Number of Steps: 1 Home Layout: One level Home Equipment: Walker - 4 wheels;Cane - single point;Shower seat - built in Additional Comments: daughter lives 2 minutes away from patient.     Prior Function           Comments: pt reports her dtr takes her to ggrocery store/errands but she does her own shopping     Hand Dominance        Extremity/Trunk Assessment   Upper Extremity Assessment Upper Extremity Assessment: Overall WFL for tasks assessed    Lower Extremity Assessment Lower Extremity Assessment: Overall WFL for tasks assessed       Communication   Communication: Expressive difficulties (word finding difficulty)  Cognition Arousal/Alertness: Awake/alert Behavior During Therapy: WFL for tasks assessed/performed Overall Cognitive Status: No family/caregiver present to determine baseline cognitive functioning  General Comments: seems to have word finding difficulty at times. ?mild confusion--states "which way is it to therapy?" not consistently oriented to place and situation,  is easily agitated by questions and redirection      General Comments      Exercises     Assessment/Plan    PT Assessment  Patient needs continued PT services  PT Problem List Decreased activity tolerance;Decreased mobility;Decreased balance;Decreased cognition       PT Treatment Interventions DME instruction;Therapeutic activities;Gait training;Functional mobility training;Therapeutic exercise;Patient/family education;Balance training    PT Goals (Current goals can be found in the Care Plan section)  Acute Rehab PT Goals Patient Stated Goal: home very soon PT Goal Formulation: With patient Time For Goal Achievement: 09/17/20 Potential to Achieve Goals: Good    Frequency Min 3X/week   Barriers to discharge        Co-evaluation               AM-PAC PT "6 Clicks" Mobility  Outcome Measure Help needed turning from your back to your side while in a flat bed without using bedrails?: None Help needed moving from lying on your back to sitting on the side of a flat bed without using bedrails?: None Help needed moving to and from a bed to a chair (including a wheelchair)?: A Little Help needed standing up from a chair using your arms (e.g., wheelchair or bedside chair)?: A Little Help needed to walk in hospital room?: A Little Help needed climbing 3-5 steps with a railing? : A Little 6 Click Score: 20    End of Session Equipment Utilized During Treatment: Gait belt Activity Tolerance: Patient tolerated treatment well Patient left: in bed;with call bell/phone within reach;with bed alarm set Nurse Communication: Mobility status PT Visit Diagnosis: Other abnormalities of gait and mobility (R26.89)    Time: [id number] PT Time Calculation (min) (ACUTE ONLY): 18 min   Charges:   PT Evaluation $PT Eval Low Complexity: [place], PT  Acute Rehab Dept ([place]) [phone] Pager [phone]  09/03/2020   [facility] 09/03/2020, 1:27 PM

## 2020-09-03 NOTE — Discharge Summary (Signed)
Physician Discharge Summary  Patient ID: Katrina Randolph MRN: [id number] DOB/AGE: 06-03-1930 87 y.o.  Admit date: 08/31/2020 Discharge date: 09/03/2020  Admission Diagnoses:  Discharge Diagnoses:  Active Problems:   Atrial fibrillation with rapid ventricular response [facility])   Discharged Condition: good  Hospital Course:  Laquaisha Portuondo is an 87 y.o. female with medical history significant of a fib on anticoagulation, HLD, GERD. She presented with right chest pain and cough. Her cough started 1 week PTA. It was non-productive.  She reports her chest pain began about 3 days ago. It's right side, stabbing, in a couple-minute episodes, and non-radiating. It's worsened with cough. Given worsening, she came in to ED. She denies any other aggravating or alleviating factors.   CTA chest showed RLL PNA and EKG revealed AF w/RVR. She was treated with ceftriaxone and doxycycline (due to Pradaxa interaction) and discharged to complete the full 5 day course (on cefdinir and doxy). Her rates improved with diltiazem and she was transitioned to PO diltiazem. For simplicity, her metoprolol was titrated to '25mg'$  BID with stable HR and diltiazem was discontinued. She remained well controlled and was up ambulating with walker. She was evaluated by PT who did not find any other needs. She was clinically doing well at time of discharge. Plan discussed with family who agreed.  Consults: None  Significant Diagnostic Studies: radiology: CTA Chest/abd/pelvis  CTA of the chest: No aortic abnormality is noted. No pulmonary emboli are seen.   Scattered mediastinal lymph nodes are seen likely reactive in nature. The lungs demonstrate bibasilar atelectasis as well as a small right-sided pleural effusion. More focal consolidation is noted in the anterolateral aspect of the right lower lobe. This is most consistent with a focal pneumonia however Followup PA and lateral chest X-ray is recommended in 3-4 weeks  following trial of antibiotic therapy to ensure resolution and exclude underlying malignancy.   CTA of the abdomen and pelvis: Atherosclerotic changes are noted without aneurysmal dilatation or dissection.   Diverticulosis without diverticulitis.   Hiatal hernia.   Postsurgical changes as described.  Treatments: antibiotics: ceftriaxone and doxycycline  Discharge Exam: Blood pressure (!) 127/92, pulse (!) 107 (after ambulating), temperature 97.8 F (36.6 C), temperature source Oral, resp. rate 16, height '5\' 1"'$  (1.549 m), weight 76.7 kg, SpO2 94 %. General exam: Appears calm and comfortable HEENT: hoarseness much improved Respiratory system: improving aeration, no resp distress, NL WOB Cardiovascular system: S1 & S2 heard, irregularly irregular. No pedal edema. Gastrointestinal system: Abdomen is nondistended, soft and nontender. No organomegaly or masses felt. Normal bowel sounds heard. Central nervous system: Alert , moves all ext Extremities: NL muscle tone Skin: No rashes, lesions or ulcers Psychiatry: Judgement and insight appear normal. Mood & affect appropriate.  Disposition: Discharge disposition: 01-Home or Self Care       Discharge Instructions     Call MD for:  difficulty breathing, headache or visual disturbances   Complete by: As directed    Call MD for:  persistant dizziness or light-headedness   Complete by: As directed    Call MD for:  persistant nausea and vomiting   Complete by: As directed    Call MD for:  severe uncontrolled pain   Complete by: As directed    Call MD for:  temperature >100.4   Complete by: As directed    Diet - low sodium heart healthy   Complete by: As directed    Increase activity slowly   Complete by: As directed  Allergies as of 09/03/2020       Reactions   Sulfa Antibiotics    "feels bad"        Medication List     TAKE these medications    acetaminophen 325 MG tablet Commonly known as: TYLENOL Take 2  tablets (650 mg total) by mouth every 4 (four) hours as needed for mild pain (or temp > 37.5 C (99.5 F)).   benzonatate 100 MG capsule Commonly known as: Tessalon Perles Take 1 capsule (100 mg total) by mouth 3 (three) times daily as needed for up to 5 days for cough.   cefdinir 300 MG capsule Commonly known as: OMNICEF Take 1 capsule (300 mg total) by mouth 2 (two) times daily for 5 doses. Start evening 7/24   dabigatran 150 MG Caps capsule Commonly known as: Pradaxa Take 1 capsule (150 mg total) by mouth 2 (two) times daily.   doxycycline 100 MG tablet Commonly known as: VIBRA-TABS Take 1 tablet (100 mg total) by mouth every 12 (twelve) hours for 5 doses. Start evening 7/24   esomeprazole 20 MG capsule Commonly known as: NEXIUM Take 1 capsule (20 mg total) by mouth daily. What changed:  when to take this reasons to take this   furosemide 20 MG tablet Commonly known as: LASIX Take 20 mg by mouth daily as needed for fluid.   guaiFENesin 600 MG 12 hr tablet Commonly known as: MUCINEX Take 1 tablet (600 mg total) by mouth 2 (two) times daily for 3 days.   levothyroxine 112 MCG tablet Commonly known as: SYNTHROID Take 1 tablet (112 mcg total) by mouth every morning.   metoprolol succinate 50 MG 24 hr tablet Commonly known as: TOPROL-XL Take 1 tablet (50 mg total) by mouth daily. Take with or immediately following a meal. What changed:  medication strength how much to take   sertraline 50 MG tablet Commonly known as: ZOLOFT Take 1 tablet (50 mg total) by mouth daily.       ASK your doctor about these medications    atorvastatin 10 MG tablet Commonly known as: LIPITOR Take 2 tablets (20 mg total) by mouth daily.         Signed: Cecille Rubin, MD MPH 09/03/2020, 4:52 PM  I spent >30 minutes in discharge planning and preparation.

## 2020-09-03 NOTE — Discharge Instructions (Signed)
You were hospitalized for pneumonia. Finish taking your medications over the next few days.

## 2020-09-05 ENCOUNTER — Ambulatory Visit: Payer: Medicare Other

## 2020-09-05 ENCOUNTER — Ambulatory Visit: Payer: Medicare Other | Admitting: Speech Pathology

## 2020-09-05 LAB — CULTURE, BLOOD (ROUTINE X 2)
Culture: NO GROWTH
Culture: NO GROWTH
Special Requests: ADEQUATE
Special Requests: ADEQUATE

## 2020-09-07 ENCOUNTER — Ambulatory Visit: Payer: Medicare Other

## 2020-09-07 ENCOUNTER — Ambulatory Visit: Payer: Medicare Other | Admitting: Speech Pathology

## 2020-09-08 ENCOUNTER — Ambulatory Visit: Payer: Medicare Other | Admitting: Cardiology

## 2020-09-12 ENCOUNTER — Ambulatory Visit: Payer: Medicare Other

## 2020-09-12 ENCOUNTER — Ambulatory Visit: Payer: Medicare Other | Admitting: Speech Pathology

## 2020-09-19 ENCOUNTER — Other Ambulatory Visit: Payer: Self-pay

## 2020-09-19 ENCOUNTER — Ambulatory Visit: Payer: Medicare Other | Attending: Physical Medicine & Rehabilitation

## 2020-09-19 ENCOUNTER — Encounter: Payer: Self-pay | Admitting: Speech Pathology

## 2020-09-19 ENCOUNTER — Ambulatory Visit: Payer: Medicare Other | Admitting: Speech Pathology

## 2020-09-19 VITALS — BP 107/79 | HR 78

## 2020-09-19 DIAGNOSIS — I63512 Cerebral infarction due to unspecified occlusion or stenosis of left middle cerebral artery: Secondary | ICD-10-CM | POA: Diagnosis present

## 2020-09-19 DIAGNOSIS — R2681 Unsteadiness on feet: Secondary | ICD-10-CM | POA: Diagnosis present

## 2020-09-19 DIAGNOSIS — R4701 Aphasia: Secondary | ICD-10-CM | POA: Diagnosis present

## 2020-09-19 DIAGNOSIS — R262 Difficulty in walking, not elsewhere classified: Secondary | ICD-10-CM | POA: Diagnosis present

## 2020-09-19 DIAGNOSIS — M6281 Muscle weakness (generalized): Secondary | ICD-10-CM | POA: Insufficient documentation

## 2020-09-19 DIAGNOSIS — R252 Cramp and spasm: Secondary | ICD-10-CM | POA: Insufficient documentation

## 2020-09-19 NOTE — Therapy (Signed)
[place] [place], [place], [address] Phone: [phone]   Fax:  [phone]  Speech Language Pathology Treatment  Patient Details  Name: Katrina Randolph MRN: [id number] Date of Birth: 29-Sep-1930 Referring Provider (SLP): Jamse Arn MD   Encounter Date: 09/19/2020   End of Session - 09/19/20 1615     Visit Number 17    Date for SLP Re-Evaluation 10/20/20    Authorization - Visit Number 1    Authorization - Number of Visits 5    SLP Start Time [id number]    SLP Stop Time  [id number]    SLP Time Calculation (min) 45 min    Activity Tolerance Patient tolerated treatment well             Past Medical History:  Diagnosis Date   Atrial fibrillation [facility])     Past Surgical History:  Procedure Laterality Date   ABDOMINAL HYSTERECTOMY     BIOPSY  10/19/2019   Procedure: BIOPSY;  Surgeon: [name], MD;  Location: [facility];  Service: Gastroenterology;;   BREAST LUMPECTOMY     CHOLECYSTECTOMY     COLONOSCOPY WITH PROPOFOL N/A 10/19/2019   Procedure: COLONOSCOPY WITH PROPOFOL;  Surgeon: [name], MD;  Location: [facility];  Service: Gastroenterology;  Laterality: N/A;   ESOPHAGOGASTRODUODENOSCOPY (EGD) WITH PROPOFOL N/A 10/19/2019   Procedure: ESOPHAGOGASTRODUODENOSCOPY (EGD) WITH PROPOFOL;  Surgeon: [name], MD;  Location: [facility];  Service: Gastroenterology;  Laterality: N/A;   POLYPECTOMY  10/19/2019   Procedure: POLYPECTOMY;  Surgeon: [name], MD;  Location: [facility];  Service: Gastroenterology;;    There were no vitals filed for this visit.   Subjective Assessment - 09/19/20 1611     Subjective Pt shares about her time in the hospital. Reports she feels like her speech hsa gotten better, but her writing is still poor.    Currently in Pain? No/denies                   ADULT SLP TREATMENT - 09/19/20 1612       General Information    Behavior/Cognition Alert;Pleasant mood      Treatment Provided   Treatment provided Cognitive-Linquistic      Cognitive-Linquistic Treatment   Treatment focused on Aphasia;Apraxia    Skilled Treatment Reassessment completed today. Pt scored a 11/15 on [place] (short form) increasing her score by 5 from initial assessment. Pt assessed using WAB-B subtests. Pt scored a 9/10 on repetition, 10/10 on aud comp, 4/10 on writing, and 10/10 on following commands. Phonemic paraphasias noted occasionally in conversation. Continue addressing phonemic paraphasias and writing.      Assessment / Recommendations / Plan   Plan Continue with current plan of care      Progression Toward Goals   Progression toward goals Progressing toward goals                SLP Short Term Goals - 09/19/20 1618       SLP SHORT TERM GOAL #1   Title Pt will verbalize spelling of a single word before writing it down independently to slow rate and decrease errors.    Time 2    Period Weeks    Status On-going   Due to hospitalization, pt regressed.     SLP SHORT TERM GOAL #2   Title Pt will correct 2-3 word phrases with phonemic paraphasias given minA.    Time 2    Period Weeks  Status On-going   pt has regressed due to hospitalization; will continue with this goal.             SLP Long Term Goals - 09/19/20 1620       SLP LONG TERM GOAL #1   Title Pt will produce <2 phonemic errors in simple sentence (<4 words).    Time 2    Period Weeks    Status Achieved      SLP LONG TERM GOAL #2   Title Pt will name 4 semantic features of a given object.    Time 2    Period Weeks    Status Achieved      SLP LONG TERM GOAL #3   Title Pt will independently correct errors in words, phrases, and/or short sentences (<4 words).    Time 4    Period Weeks    Status On-going   Ongoing due to regression from hospitalization             Plan - 09/19/20 1616     Clinical Impression Statement  Prior to hospitalization, pt was ready to "be done" with therapies. Pt agreed to complete one more month of therapy to continue to work on her speech and writing.    Speech Therapy Frequency 1x /week    Duration 4 weeks    Treatment/Interventions Cueing hierarchy;Functional tasks;Patient/family education;Compensatory strategies;Environmental controls;Multimodal communcation approach;Language facilitation;Compensatory techniques;Cognitive reorganization;SLP instruction and feedback;Internal/external aids    Potential to Achieve Goals Good    Consulted and Agree with Plan of Care Patient             Patient will benefit from skilled therapeutic intervention in order to improve the following deficits and impairments:   Aphasia    Problem List Patient Active Problem List   Diagnosis Date Noted   Atrial fibrillation with rapid ventricular response ([place]) 08/31/2020   Neuropathic pain    Dysesthesia    Prediabetes    Expressive aphasia    Atrial fibrillation ([place])    Left middle cerebral artery stroke ([place]) 06/05/2020   CVA (cerebral vascular accident) ([place]) 05/30/2020   Orthostatic hypotension 01/26/2020   Essential hypertension 01/26/2020   Acute respiratory failure ([place]) 10/19/2019   Colon polyp 10/19/2019   Orthostatic dizziness 10/19/2019   Hypothyroidism 10/19/2019   Rapid atrial fibrillation ([name]) 10/12/2019   Pneumonia 10/12/2019   Anemia requiring transfusions 10/12/2019   DNR (do not resuscitate) 10/12/2019   Acquired thrombophilia ([place]) 10/12/2019   Iron deficiency anemia 11/24/2017   Paroxysmal atrial fibrillation ([place]) 11/24/2017   Chronic depression 11/24/2017   GERD (gastroesophageal reflux disease) 11/24/2017   Breast cancer ([place]) 11/24/2017   Hypothyroid 11/24/2017   Dyslipidemia 11/24/2017   Speech Therapy Recertification  New reporting period: 09-19-20 to 10-20-20  Subjective Statement: "I am just ready to see Jenny Reichmann." (Late husband)  Objective: See tx  note.  Goal Update: Has regressed. Will see for additional month.   Plan: Cont to address phonemic paraphasias in writing and speech.  Reason Skilled Services are Required: To increase confidence in communication skills.  Rosann Auerbach [place] MS, [place], CBIS  09/19/2020, 4:21 PM  [address]. [place], [place], [address] Phone: [phone]   Fax:  [phone]   Name: Katrina Randolph MRN: [id number] Date of Birth: 1930-08-21

## 2020-09-20 NOTE — Therapy (Addendum)
[address]. [place], [place], [address] Phone: [phone]   Fax:  [phone]  Physical Therapy Treatment/ Recertification  Patient Details  Name: Katrina Randolph MRN: [id number] Date of Birth: 01-21-1931 Referring Provider (PT): Posey Pronto   Encounter Date: 09/19/2020   PT End of Session - 09/19/20 1353     Visit Number 16    Date for PT Re-Evaluation 10/20/20    Authorization Type [employer] MCR- medical necessity    PT Start Time 1350    PT Stop Time 1430    PT Time Calculation (min) 40 min    Activity Tolerance Patient tolerated treatment well    Behavior During Therapy [facility] for tasks assessed/performed             Past Medical History:  Diagnosis Date   Atrial fibrillation [facility])     Past Surgical History:  Procedure Laterality Date   ABDOMINAL HYSTERECTOMY     BIOPSY  10/19/2019   Procedure: BIOPSY;  Surgeon: [name], MD;  [facility]: [facility];  Service: Gastroenterology;;   BREAST LUMPECTOMY     CHOLECYSTECTOMY     COLONOSCOPY WITH PROPOFOL N/A 10/19/2019   Procedure: COLONOSCOPY WITH PROPOFOL;  Surgeon: [name], MD;  Location: [facility];  Service: Gastroenterology;  Laterality: N/A;   ESOPHAGOGASTRODUODENOSCOPY (EGD) WITH PROPOFOL N/A 10/19/2019   Procedure: ESOPHAGOGASTRODUODENOSCOPY (EGD) WITH PROPOFOL;  Surgeon: [name], MD;  [facility]: [facility];  Service: Gastroenterology;  Laterality: N/A;   POLYPECTOMY  10/19/2019   Procedure: POLYPECTOMY;  Surgeon: [name], MD;  Location: [facility];  Service: Gastroenterology;;    Vitals:   09/19/20 1354  BP: 107/79  Pulse: 78  SpO2: 97%     Subjective Assessment - 09/19/20 1351     Subjective Pt returns after 3 wk gap in therapy d/t hospitalization from Afib and PNA. Today report "legs arent working"                [facility] PT Assessment - 09/19/20 0001       Assessment   Medical Diagnosis L CVA     Referring Provider (PT) Posey Pronto    Prior Therapy recently hospitalized      Berg Balance Test   Sit to Stand Able to stand without using hands and stabilize independently    Standing Unsupported Able to stand safely 2 minutes    Sitting with Back Unsupported but Feet Supported on Floor or Stool Able to sit safely and securely 2 minutes    Stand to Sit Sits safely with minimal use of hands    Transfers Able to transfer safely, definite need of hands    Standing Unsupported with Eyes Closed Able to stand 10 seconds safely    Standing Unsupported with Feet Together Able to place feet together independently and stand 1 minute safely    From Standing, Reach Forward with Outstretched Arm Can reach forward >12 cm safely (5")    From Standing Position, Pick up Object from Floor Able to pick up shoe safely and easily    From Standing Position, Turn to Look Behind Over each Shoulder Looks behind one side only/other side shows less weight shift    Turn 360 Degrees Able to turn 360 degrees safely but slowly    Standing Unsupported, Alternately Place Feet on Step/Stool Able to complete 4 steps without aid or supervision    Standing Unsupported, One Foot in [employer] balance while stepping or standing    Standing on  One Leg Tries to lift leg/unable to hold 3 seconds but remains standing independently    Total Score 42                           [facility] Adult PT Treatment/Exercise - 09/19/20 0001       Ambulation/Gait   Gait Comments no device around clinic short distances with standing and seated breaks as needed for fatigue management. 2 laps x 110 ft      High Level Balance   High Level Balance Activities Side stepping   CGA to HHAx1     Knee/Hip Exercises: Standing   Hip Extension Both;10 reps;Knee straight   BUE on FWW   Other Standing Knee Exercises standign march alt B 2 x 5 with CGA - increased SLS instability      Knee/Hip Exercises: Seated   Other Seated Knee/Hip Exercises  hip abd red TB X 20    Marching Both;2 sets;10 reps    Sit to [employer] 10 reps                      PT Short Term Goals - 08/03/20 1353       PT SHORT TERM GOAL #1   Title Independent with initial HEP    Time 2    Period Weeks    Status Achieved               PT Long Term Goals - 09/19/20 1355       PT LONG TERM GOAL #1   Title Independent with advanced HEP    Status On-going      PT LONG TERM GOAL #2   Title Pt will be able to complete 5TSTS and TUG independent and without LOB in </= 12 seconds to demonstrate improved functional strength, gait speed, and balance.    Baseline 08/03/20: 5TSTS 19 seconds without UE support. TUG 17 seconds CS/CGA without AD, some breathlessness persists. post hospitalization 09/19/20 21 seconds without UE support. TUG 21 seconds slow and cautious without AD esp with turns.    Status On-going      PT LONG TERM GOAL #3   Title Pt will score at least 46/56 on the berg balance scale to demonstrate decreased falls risk    Baseline 06/19/20: 36/56.  08/03/20: 46/56. 09/19/20: 42/56    Status On-going      PT LONG TERM GOAL #4   Title BLE strength at least 4+/5    Status Partially Met      PT LONG TERM GOAL #5   Title Pt will be able to ambulate indoor and outdoor uneven surfaces and negotiate obstacles with LRAD and no LOB to facilitate safety within home and community environments.    Baseline without AD intermittent LOB, leg crossing espeically with turns or direction changes. Instability with narrow base and SLS activities    Status On-going   requires FWW for safety at this time                  Plan - 09/19/20 1418     Clinical Impression Statement Pt returns after 3 wk gap in therapy d/t hospitalization from Afib and PNA. Goals reassessed and she has regressed instrength, functional mobility and balance since previous assessment. Increased instability on SL and with turns especially without AD and she also report she is  depending on it more at home. She was able to complete 2 ~110  ft laps at [facility] without AD in clinic today but she demosntrated increased instability with turns and intermittently veering to one side. She will benefit from continued skilled physical therapy to work towards imprroving functional strength, balance and functional mobility.    PT Frequency 2x / week    PT Duration 4 weeks    PT Treatment/Interventions ADLs/Self Care Home Management;Cryotherapy;Electrical Stimulation;Moist Heat;Gait training;Neuromuscular re-education;Therapeutic exercise;Therapeutic activities;Functional mobility training;Patient/family education;Manual techniques;Energy conservation;Taping;Vestibular    PT Next Visit Plan try to maximize her function and her safety in the community. rest breaks as needed to manage fatigue    Consulted and Agree with Plan of Care Patient             Patient will benefit from skilled therapeutic intervention in order to improve the following deficits and impairments:  Decreased endurance, Pain, Decreased balance, Decreased mobility, Decreased strength, Impaired sensation, Abnormal gait  Visit Diagnosis: Unsteadiness on feet  Muscle weakness (generalized)  Difficulty in walking, not elsewhere classified  Left middle cerebral artery stroke (H[facility])  Cramp and spasm     Problem List Patient Active Problem List   Diagnosis Date Noted   Atrial fibrillation with rapid ventricular response ([place]) 08/31/2020   Neuropathic pain    Dysesthesia    Prediabetes    Expressive aphasia    Atrial fibrillation ([place])    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   CVA (cerebral vascular accident) ([place]) 05/30/2020   Orthostatic hypotension 01/26/2020   Essential hypertension 01/26/2020   Acute respiratory failure ([place]) 10/19/2019   Colon polyp 10/19/2019   Orthostatic dizziness 10/19/2019   Hypothyroidism 10/19/2019   Rapid atrial fibrillation ([place]) 10/12/2019   Pneumonia  10/12/2019   Anemia requiring transfusions 10/12/2019   DNR (do not resuscitate) 10/12/2019   Acquired thrombophilia ([place]) 10/12/2019   Iron deficiency anemia 11/24/2017   Paroxysmal atrial fibrillation ([place]) 11/24/2017   Chronic depression 11/24/2017   GERD (gastroesophageal reflux disease) 11/24/2017   Breast cancer ([place]) 11/24/2017   Hypothyroid 11/24/2017   Dyslipidemia 11/24/2017   PHYSICAL THERAPY DISCHARGE SUMMARY 09/27/20  Visits from Start of Care: 16   Plan: Pt contacted clinic on 09/27/2020 to cancel all remaining appointments and requested end to therapies at this time.      Hall Busing, PT, DPT 09/20/2020, 7:35 AM  [address]. [place], [place], [address] Phone: [phone]   Fax:  [phone]  Name: Taviana Westergren MRN: [id number] Date of Birth: December 23, 1930

## 2020-09-26 ENCOUNTER — Ambulatory Visit: Payer: Medicare Other

## 2020-09-26 ENCOUNTER — Ambulatory Visit: Payer: Medicare Other | Admitting: Speech Pathology

## 2020-10-02 ENCOUNTER — Ambulatory Visit: Payer: Medicare Other | Admitting: Speech Pathology

## 2020-10-02 ENCOUNTER — Ambulatory Visit: Payer: Medicare Other

## 2020-10-31 ENCOUNTER — Encounter: Payer: Self-pay | Admitting: Adult Health

## 2020-10-31 ENCOUNTER — Ambulatory Visit: Payer: Medicare Other | Admitting: Adult Health

## 2020-10-31 VITALS — BP 133/68 | HR 76 | Ht 62.0 in | Wt 165.0 lb

## 2020-10-31 DIAGNOSIS — I482 Chronic atrial fibrillation, unspecified: Secondary | ICD-10-CM

## 2020-10-31 DIAGNOSIS — I63412 Cerebral infarction due to embolism of left middle cerebral artery: Secondary | ICD-10-CM | POA: Diagnosis not present

## 2020-10-31 NOTE — Progress Notes (Signed)
[facility] [address] [place]. [place] [address] [phone]       OFFICE FOLLOW UP NOTE  Ms. Grenda Lora Date of Birth:  [birth year] Medical Record Number:  [id number]   Primary neurologist: Dr. Leonie Man Referring MD:  Silvestre Mesi, PA-c  Reason for Referral: Stroke  Chief Complaint  Patient presents with   Follow-up    RM 2 alone Pt is well and stable      HPI:   Today, 10/31/2020, Katrina Randolph returns for 85-month stroke follow-up.  Initially doing well making recovery in regards to balance and speech but unfortunately had a regression after 3-day hospitalization on 08/31/2020 for pneumonia and A. fib with RVR.  She was reevaluated by therapies on 8/9 with noted regression with some improvement but not yet back to how she was prior to admission.  She was not interested in additional therapy sessions. She continues HEP routinely for residual strengthening and occasional speech difficulty. She continues to live independently maintaining ADLs and IADLs independently. Right foot numbness and occasional zap sensation in RUE. intolerant to gabapentin therefore transition to topiramate after prior visit which she also had difficulty tolerating.  Denies new stroke/TIA symptoms.  She has since been switched to Pradaxa with concerns of Eliquis causing upper extremity and groin rash -she has been tolerating Pradaxa without difficulty.  Blood pressure today 133/68. She does complain of red itchy spots on her chest and head which have been present since around April of this year.  She is concerned this could be a medication reaction.  She has not further discussed this with her PCP.  No further concerns at this time.    History provided for reference purposes only Consult visit 07/25/2020 Dr. Leonie Man: Ms. Stahl is a pleasant 87 year old Caucasian lady seen today for initial office consultation visit for stroke.  History is obtained from the patient and her daughters  accompanying her as well as review of electronic medical records and I personally reviewed pertinent available imaging films in PACS.  She has a past medical history of atrial fibrillation who was not on anticoagulation or antiplatelet medications secondary to chronic anemia.  Patient presented to [facility] emergency room with difficulty in communication and decreased right hemibody sensation and paresthesias.  The daughter also noticed some difficulty with walking and some right-sided weakness.  She presented outside the time window for tPA.  MRI scan of the brain was obtained which showed posterior division left MCA territory acute infarct with occlusion of multiple left M2 and M3 branches.  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  Transthoracic echo showed ejection fraction of 55 to 60% without any clot.  LDL cholesterol is 103 mg percent and hemoglobin A1c was 6.2.  After careful discussion about risk benefit of anticoagulation and bleeding risk and stroke prevention the family agreed to start Eliquis which was started.  Patient had previously seen electrophysiologist [name] and had initial discussion for consideration for watchman device but she declined this after careful review of risk-benefit.  Patient states she has noticed improvement in her speech and language and is currently undergoing outpatient speech and physical therapy.  Right-sided strength is improved but she still has significant right-sided paresthesias.  She is has been started on gabapentin 100 mg 3 times daily but feels its not working plus she has noticed increased swelling in her feet.  She has not had recurrent  stroke or TIA symptoms.  She however has noticed headaches since her stroke.  They are not constant but they are all the time.  At times in the front but may also be on other occasions towards the back.  She describes this as a dull  ache 5/10 in severity.  There is no accompanying nausea, vomiting, light or sound sensitivity.  She is able to carry on her usual activities despite the headaches.  She takes 2 tablets of Tylenol which seems to help.  She is also noticed mild tenderness in the scalp but denies any jaw claudication, blurred vision or loss of vision or muscle aches or pains.  She is tolerating Eliquis well without bleeding but does have minor bruising.  She lives alone and daughter keeps a close watch on her.  ROS:   14 system review of systems is positive for those listed in HPI and all other systems negative  PMH:  Past Medical History:  Diagnosis Date   Atrial fibrillation ([place])     Social History:  Social History   Socioeconomic History   Marital status: Married    Spouse name: Not on file   Number of children: Not on file   Years of education: Not on file   Highest education level: Not on file  Occupational History   Not on file  Tobacco Use   Smoking status: Former    Types: Cigarettes    Quit date: 08/31/1980    Years since quitting: 40.1   Smokeless tobacco: Never   Tobacco comments:    quit over 20 Years ago  Vaping Use   Vaping Use: Never used  Substance and Sexual Activity   Alcohol use: Yes    Comment: occasionally   Drug use: Never   Sexual activity: Not Currently  Other Topics Concern   Not on file  Social History Narrative   Lives alone   Right Handed    Drinks 2 cups caffeine daily   Social Determinants of Health   Financial Resource Strain: Not on file  Food Insecurity: Not on file  Transportation Needs: Not on file  Physical Activity: Not on file  Stress: Not on file  Social Connections: Not on file  Intimate Partner Violence: Not on file    Medications:   Current Outpatient Medications on File Prior to Visit  Medication Sig Dispense Refill   acetaminophen (TYLENOL) 325 MG tablet Take 2 tablets (650 mg total) by mouth every 4 (four) hours as needed for mild  pain (or temp > 37.5 C (99.5 F)).     atorvastatin (LIPITOR) 10 MG tablet Take 2 tablets (20 mg total) by mouth daily. 60 tablet 0   dabigatran (PRADAXA) 150 MG CAPS capsule Take 1 capsule (150 mg total) by mouth 2 (two) times daily. 60 capsule 3   esomeprazole (NEXIUM) 20 MG capsule Take 1 capsule (20 mg total) by mouth daily. 30 capsule 0   furosemide (LASIX) 20 MG tablet Take 20 mg by mouth daily as needed for fluid.      levothyroxine (SYNTHROID) 112 MCG tablet Take 1 tablet (112 mcg total) by mouth every morning. 30 tablet 0   sertraline (ZOLOFT) 50 MG tablet Take 1 tablet (50 mg total) by mouth daily. 30 tablet 0   metoprolol succinate (TOPROL-XL) 50 MG 24 hr tablet Take 1 tablet (50 mg total) by mouth daily. Take with or immediately following a meal. 30 tablet 0   No current facility-administered medications on file prior to  visit.    Allergies:   Allergies  Allergen Reactions   Sulfa Antibiotics     "feels bad"    Physical Exam Today's Vitals   10/31/20 1412  BP: 133/68  Pulse: 76  Weight: 165 lb (74.8 kg)  Height: 5\' 2"  (1.575 m)   Body mass index is 30.18 kg/m.   General: Mildly obese pleasant elderly Caucasian lady, seated, in no evident distress Head: head normocephalic and atraumatic.   Neck: supple with no carotid or supraclavicular bruits Cardiovascular: irregular rate and rhythm, no murmurs Musculoskeletal: no deformity Skin:  no rash/petichiae Vascular:  Normal pulses all extremities.  Bilateral ankle swelling.  Neurologic Exam Mental Status: Awake and fully alert. Oriented to place and time. Recent and remote memory intact. Attention span, concentration and fund of knowledge appropriate. Mood and affect appropriate.  Mild expressive aphasia with occasional paraphasic errors and word finding difficulties.  No dysarthria Cranial Nerves: Pupils equal, briskly reactive to light. Extraocular movements full without nystagmus. Visual fields full to confrontation.  Hearing intact. Facial sensation intact. Face, tongue, palate moves normally and symmetrically.  Motor: Normal bulk and tone. Normal strength in all tested extremity   Sensory :.  Decreased light touch sensation RLE.  Intact in all other extremities.  Subjective paresthesias in the right lower extremity.  Coordination: Rapid alternating movements normal in all extremities. Finger-to-nose and heel-to-shin performed accurately bilaterally. Gait and Station: Arises from chair without difficulty. Stance is normal. Gait demonstrates normal stride length and balance . Able to heel, toe and tandem walk without difficulty.  Reflexes: 1+ and symmetric. Toes downgoing.       ASSESSMENT/PLAN: 87 year old Caucasian lady with left MCA branch infarct and April 2022 secondary to chronic atrial fibrillation while not on anticoagulation due to recurrent symptomatic anemia.   Vascular risk factors of atrial fibrillation, hypertension, hyperlipidemia, borderline diabetes, mild obesity and age    37.  Left MCA stroke -Residual deficits: Residual mild expressive aphasia and RLE paresthesias with ongoing improvement.  Encouraged continued HEP for possible further recovery -Continue Pradaxa for secondary stroke prevention  -HTN: BP goal<130/90.  Stable monitored by PCP -HLD: LDL goal< 70. Recent LDL 73 down from 103.  Was prescribed atorvastatin at discharge in 05/2020 but she apparently never started -as recent LDL near goal, she wishes to hold off on starting any other medication until her skin concerns are addressed  2. Atrial fibrillation -continue Pradaxa 150 mg twice daily -Intolerant to Eliquis (c/o rash upper arms and groin after starting) -Advised to schedule follow-up visit with cardiology [name]  3. Skin concerns -small red areas on chest and head of unknown cause but seems to be consistent with eczema -low suspicion of medication reaction -Advised to discuss further with PCP and may potentially  need referral to dermatology if indicated   Follow-up in 6 months or call earlier if needed    CC:  [place] provider: Dr. Shirlee More, Loma Sousa, PA-C   I spent 38 minutes of face-to-face and non-face-to-face time with patient.  This included previsit chart review, lab review, study review, order entry, electronic health record documentation, patient education and discussion regarding history of prior stroke, secondary stroke prevention measures and aggressive stroke risk factor management, residual stroke deficits, skin concerns and further discussion and answered all other questions to patient satisfaction  Frann Rider, AGNP-BC  [facility] [address] [place], [place] [address]  Phone [phone] Fax [phone] Note: This document was prepared with digital dictation and possible smart  Company secretary. Any transcriptional errors that result from this process are unintentional.

## 2020-10-31 NOTE — Patient Instructions (Signed)
Continue Pradaxa (dabigatran) twice a day for secondary stroke prevention  Continue to follow up with PCP regarding blood pressure management and routine monitoring of cholesterol levels   Please schedule follow-up visit with your cardiologist  Maintain strict control of hypertension with blood pressure goal below 130/90 and cholesterol with LDL cholesterol (bad cholesterol) goal below 70 mg/dL.   Would recommend follow up with your primary care provider      Followup in the future with me in 6 months or call earlier if needed       Thank you for coming to see [place] at [facility]. I hope we have been able to provide you high quality care today.  You may receive a patient satisfaction survey over the next few weeks. We would appreciate your feedback and comments so that we may continue to improve ourselves and the health of our patients.

## 2020-11-01 NOTE — Progress Notes (Signed)
I agree with the above plan 

## 2020-12-11 ENCOUNTER — Other Ambulatory Visit: Payer: Self-pay | Admitting: Neurology

## 2021-04-19 NOTE — Progress Notes (Signed)
?  ?Cardiology Office Note ? ? ?Date:  04/20/2021  ? ?ID:  Katrina Randolph, DOB [birth year], MRN [id number] ? ?PCP:  [name], PA-C  ?Cardiologist:   [name], MD ? ? ?Chief Complaint  ?Patient presents with  ? Atrial Fibrillation  ? ? ?  ?History of Present Illness: ?Katrina Randolph is a 88 y.o. female who for follow up of chronic atrial fibrillation.  She is not on anticoagulation due to anemia that has required multiple transfusions.  There is been no clear source of her bleeding.  She was admitted to the hospital 10/12/2019-10/20/2019 with rapid atrial fibrillation, pneumonia, anemia, acute thrombocytopenia, and orthostatic dizziness.  She had presented to her PCP and was found to have a tachycardic rhythm with a heart rate greater than 160 bpm.  We were involved to help with heart rate control.  She had active bleeding and GI evaluation with colonoscopy and EGD with no clear source identified.  She has a hiatal hernia and Cameron's erosions.  She has some small polyps and diverticulosis as well as hemorrhoids.  She has had orthostatic hypotension.   Cozaar was stopped.    ? ?She returns for follow up.   I had sent her to consider Watchman but she declined.  She subsequently had a CVA.   She was subsequently treated with Pradaxa.   However, she took herself off the Pradaxa.  It turns out she has been on the beta-blocker.  She does not feel her atrial fibrillation.  She has not had any presyncope or syncope although she has some mild dizziness.  She has some chronic weakness and some gait disturbance.  She is close to work with a cane.  She says her memory is off a little bit.  She has a little expressive aphasia.  She is not describing any chest pressure, neck or arm discomfort.  She is not having any new shortness of breath, PND or orthopnea. ? ? ?Past Medical History:  ?Diagnosis Date  ? Atrial fibrillation ([place])   ? ? ?Past Surgical History:  ?Procedure Laterality Date  ? ABDOMINAL  HYSTERECTOMY    ? BIOPSY  10/19/2019  ? Procedure: BIOPSY;  Surgeon: [name], MD;  Location: [facility];  Service: Gastroenterology;;  ? BREAST LUMPECTOMY    ? CHOLECYSTECTOMY    ? COLONOSCOPY WITH PROPOFOL N/A 10/19/2019  ? Procedure: COLONOSCOPY WITH PROPOFOL;  Surgeon: [name], MD;  Location: [facility];  Service: Gastroenterology;  Laterality: N/A;  ? ESOPHAGOGASTRODUODENOSCOPY (EGD) WITH PROPOFOL N/A 10/19/2019  ? Procedure: ESOPHAGOGASTRODUODENOSCOPY (EGD) WITH PROPOFOL;  Surgeon: [name], MD;  Location: [facility];  Service: Gastroenterology;  Laterality: N/A;  ? POLYPECTOMY  10/19/2019  ? Procedure: POLYPECTOMY;  Surgeon: [name], MD;  Location: [facility];  Service: Gastroenterology;;  ? ? ? ?Current Outpatient Medications  ?Medication Sig Dispense Refill  ? acetaminophen (TYLENOL) 325 MG tablet Take 2 tablets (650 mg total) by mouth every 4 (four) hours as needed for mild pain (or temp > 37.5 C (99.5 F)).    ? clotrimazole-betamethasone (LOTRISONE) cream APPLY TO AFFECTED AREA TOPICALLY TWICE A DAY FOR 14 DAYS    ? esomeprazole (NEXIUM) 20 MG capsule Take 1 capsule (20 mg total) by mouth daily. 30 capsule 0  ? furosemide (LASIX) 20 MG tablet Take 20 mg by mouth daily as needed for fluid.     ? gabapentin (NEURONTIN) 100 MG capsule Take 100 mg by mouth 3 (three) times daily.    ? levothyroxine (SYNTHROID)  112 MCG tablet Take 1 tablet (112 mcg total) by mouth every morning. 30 tablet 0  ? sertraline (ZOLOFT) 50 MG tablet Take 1 tablet (50 mg total) by mouth daily. 30 tablet 0  ? atorvastatin (LIPITOR) 10 MG tablet Take 2 tablets (20 mg total) by mouth daily. (Patient not taking: Reported on 04/20/2021) 60 tablet 0  ? metoprolol succinate (TOPROL-XL) 50 MG 24 hr tablet Take 1 tablet (50 mg total) by mouth daily. Take with or immediately following a meal. 90 tablet 3  ? PRADAXA 150 MG CAPS capsule TAKE 1 CAPSULE BY MOUTH TWICE A DAY (Patient not taking: Reported on  04/20/2021) 60 capsule 3  ? ?No current facility-administered medications for this visit.  ? ? ?Allergies:   Sulfa antibiotics  ? ? ?ROS:  Please see the history of present illness.   Otherwise, review of systems are positive for none.   All other systems are reviewed and negative.  ? ? ?PHYSICAL EXAM: ?VS:  BP 126/68   Pulse (!) 120   Ht '5\' 2"'$  (1.575 m)   Wt 166 lb 12.8 oz (75.7 kg)   SpO2 95%   BMI 30.51 kg/m?  , BMI Body mass index is 30.51 kg/m?. ?GENERAL:  Well appearing ?NECK:  No jugular venous distention, waveform within normal limits, carotid upstroke brisk and symmetric, no bruits, no thyromegaly ?LUNGS:  Clear to auscultation bilaterally ?CHEST:  Unremarkable ?HEART:  PMI not displaced or sustained,S1 and S2 within normal limits, no S3,  no clicks, no rubs, no murmurs, irregular ?ABD:  Flat, positive bowel sounds normal in frequency in pitch, no bruits, no rebound, no guarding, no midline pulsatile mass, no hepatomegaly, no splenomegaly ?EXT:  2 plus pulses throughout, no edema, no cyanosis no clubbing ? ?EKG:  EKG is  ordered today. ?Atrial fibrillation, rapid ventricular rate, low voltage in limb leads, axis within normal limits, intervals within normal limits, no acute ST-T wave changes. ? ? ?Recent Labs: ?08/31/2020: ALT 12 ?09/01/2020: Hemoglobin 11.1; Platelets 180 ?09/02/2020: BUN 20; Creatinine, Ser 0.59; Magnesium 2.0; Potassium 4.3; Sodium 137  ? ? ?Lipid Panel ?   ?Component Value Date/Time  ? CHOL 134 09/01/2020 0434  ? TRIG 65 09/01/2020 0434  ? HDL 48 09/01/2020 0434  ? CHOLHDL 2.8 09/01/2020 0434  ? VLDL 13 09/01/2020 0434  ? [place] 73 09/01/2020 0434  ? ?  ? ?Wt Readings from Last 3 Encounters:  ?04/20/21 166 lb 12.8 oz (75.7 kg)  ?10/31/20 165 lb (74.8 kg)  ?08/31/20 169 lb (76.7 kg)  ?  ? ? ?Other studies Reviewed: ?Additional studies/ records that were reviewed today include: Hospital records ?Review of the above records demonstrates:  Please see elsewhere in the note.    ? ? ?ASSESSMENT AND PLAN: ? ? ?Atrial fibrillation with RVR:   [name] has a CHA2DS2 - VASc score of 6.  I had a long conversation with her and she does not want to get committed to taking a blood thinner.  She does not want to commit to getting the Watchman.  I described to her very high risk of another event.  I have suggested she reconsider the Watchman.  I offered to call and talk to her daughter she says this will be her decision.  She will let me know.  In the meantime her rate is too fast but she has been out of her beta-blocker for weeks and so I am going to represcribe this.   She has a high  fall risk but was tolerating the Pradaxa.  At the least I think she should restart this although this is not the better choice.  I think the risk of neither anticoagulation for Watchman is very high risk and she understands this conversation. ? ?Essential hypertension: Her blood pressure will be managed in the context of controlling her rate. ? ?Orthostatic hypotension:   She does have a little dizziness.  She understands being very careful.  She came in here without her cane today awaiting discussion about this.  ? ?Hypothyroidism:    I do not see a recent TSH and she should get one when she returns. ? ? ? ?Current medicines are reviewed at length with the patient today.  The patient does not have concerns regarding medicines. ? ?The following changes have been made:  None ? ?Labs/ tests ordered today include: None ? ?Orders Placed This Encounter  ?Procedures  ? EKG 12-Lead  ? ? ? ?Disposition:   FU with APP in 3 months.  ? ? ?Signed, ?[name], MD  ?04/20/2021 3:23 PM    ?[place] ? ?] ? ?

## 2021-04-20 ENCOUNTER — Telehealth: Payer: Self-pay | Admitting: Cardiology

## 2021-04-20 ENCOUNTER — Other Ambulatory Visit: Payer: Self-pay

## 2021-04-20 ENCOUNTER — Ambulatory Visit (INDEPENDENT_AMBULATORY_CARE_PROVIDER_SITE_OTHER): Payer: Medicare Other | Admitting: Cardiology

## 2021-04-20 ENCOUNTER — Encounter: Payer: Self-pay | Admitting: Cardiology

## 2021-04-20 VITALS — BP 126/68 | HR 120 | Ht 62.0 in | Wt 166.8 lb

## 2021-04-20 DIAGNOSIS — I482 Chronic atrial fibrillation, unspecified: Secondary | ICD-10-CM

## 2021-04-20 DIAGNOSIS — I10 Essential (primary) hypertension: Secondary | ICD-10-CM | POA: Diagnosis not present

## 2021-04-20 MED ORDER — METOPROLOL SUCCINATE ER 50 MG PO TB24: 50.0000 mg | ORAL_TABLET | Freq: Every day | ORAL | 3 refills | Status: DC

## 2021-04-20 NOTE — Patient Instructions (Signed)
Medication Instructions:  ?Your physician recommends that you continue on your current medications as directed. Please refer to the Current Medication list given to you today.  ? ?*If you need a refill on your cardiac medications before your next appointment, please call your pharmacy* ? ?Lab Work: ?NONE ? ?Testing/Procedures: ?NONE ? ?Follow-Up: ?At [facility], you and your health needs are our priority.  As part of our continuing mission to provide you with exceptional heart care, we have created designated Provider Care Teams.  These Care Teams include your primary Cardiologist (physician) and Advanced Practice Providers (APPs -  Physician Assistants and Nurse Practitioners) who all work together to provide you with the care you need, when you need it. ? ?We recommend signing up for the patient portal called "MyChart".  Sign up information is provided on this After Visit Summary.  MyChart is used to connect with patients for Virtual Visits (Telemedicine).  Patients are able to view lab/test results, encounter notes, upcoming appointments, etc.  Non-urgent messages can be sent to your provider as well.   ?To learn more about what you can do with MyChart, go to [email or web address].   ? ?Your next appointment:   ?4 month(s) ? ?The format for your next appointment:   ?In Person ? ?Provider:   ?WITH NP/PA  ?

## 2021-04-20 NOTE — Telephone Encounter (Signed)
Daughter calling saying patient is ready to do the watchmon. Please advise  ?

## 2021-04-20 NOTE — Telephone Encounter (Signed)
Routed to [name] and Rip Harbour LPN - patient seen in office today  ?

## 2021-04-23 ENCOUNTER — Encounter: Payer: Self-pay | Admitting: Hematology and Oncology

## 2021-04-23 ENCOUNTER — Telehealth: Payer: Self-pay

## 2021-04-23 DIAGNOSIS — I482 Chronic atrial fibrillation, unspecified: Secondary | ICD-10-CM

## 2021-04-23 NOTE — Telephone Encounter (Signed)
-----   Message from [name], MD sent at 04/21/2021  7:22 AM EST ----- ?Please schedule for watchman consult. Thanks! ?CL ?

## 2021-04-23 NOTE — Telephone Encounter (Signed)
Dr Percival Spanish reached out to team 3/10 to get patient scheduled ?Patient has been scheduled with APP 04/30/2021, see phone note 3/13 ?

## 2021-04-23 NOTE — Telephone Encounter (Signed)
Spoke with the patient's daughter in great detail. They had a formal Watchman consult with [name] in January 2022.  ?Instead of seeing [name], scheduled the patient with Kathyrn Drown 04/30/21 to discuss the procedure again and to get CT instruction letter as they wish to proceed with LAAO ASAP. ?She was grateful for call and agrees with plan.  ?

## 2021-04-24 NOTE — Addendum Note (Signed)
Addended by: Harland German A on: 04/24/2021 11:25 AM ? ? Modules accepted: Orders ? ?

## 2021-04-24 NOTE — Telephone Encounter (Signed)
Scheduled the patient for Watchman CT 3/24 at 1130. ?Per previous conversation, the patient will receive her instructions at time of visit on 04/30/21. ?

## 2021-04-26 NOTE — Progress Notes (Addendum)
?[facility]   ?                                  ?Cardiology Office Note:   ? ?Date:  04/30/2021  ? ?ID:  Katrina Randolph, DOB 30-Dec-1930, MRN [id number] ? ?PCP:  [name], PA-C  ?[facility] Cardiologist:  [name], MD  ?[facility] Electrophysiologist:  [name], MD  ? ?Referring MD: [name], PA-C  ? ?Chief Complaint  ?Patient presents with  ? Follow-up  ?  Pre Watchman CT   ? ?History of Present Illness:   ? ?Katrina Randolph is a 88 y.o. female with a hx of chronic atrial fibrillation not on anticoagulation due to anemia requiring multiple transfusions with no clear source of her bleeding by EGD.  ? ?She was admitted to the hospital 10/12/2019-10/20/2019 with atrial fibrillation with RVR found to have PNA,  anemia, acute thrombocytopenia, and orthostatic dizziness. She had presented to her PCP and was found to have a tachycardic rhythm with a heart rate greater than 160 bpm. She was referred to the ED for further evaluation. Her hemoglobin had trended down to 7.7 with reportedly dark colored stools. On GI evaluation with colonoscopy and EGD, there was no clear source identified although she has a hiatal hernia and Cameron's erosions with small polyps and diverticulosis.  ? ?On follow up with [name], she was ultimately referred to [name] for consideration of Watchman implant however she deferred at the time of her evaluation. Unfortunately she was admitted thereafter with an ischemic stroke. [name] was contacted regarding anticoagulation however given acute CVA, this was deferred to neurology. ?  ?In follow up with [name] 04/20/21 she reportedly took herself off the Pradaxa out of fear of anticoagulation therapies. Watchman procedure was revisited and plan was to reschedule to discuss proceeding with implant. She was restarted in her beta blocker (which she had been out of for several weeks) and it was reccommended that she restart  Pradaxa as she seemed to be tolerating this well.  ? ?Today she presents for follow up with her daughter. She has been doing well since she was last seen. She is interested in pursuing Watchman at this time. On initial evaluation with [name], the daughter reports that she recorded the conversation on her phone, which they have both re-listened to earlier this week which answered many of their pre procedure questions. She reports full compliance with Pradaxa at this time. She has no new neuro changes, denies chest pain, palpitations, LE edema, orthopnea, dizziness, syncope, or falls.  ?  ?Past Medical History:  ?Diagnosis Date  ? Atrial fibrillation ([place])   ? ? ?Past Surgical History:  ?Procedure Laterality Date  ? ABDOMINAL HYSTERECTOMY    ? BIOPSY  10/19/2019  ? Procedure: BIOPSY;  Surgeon: [name], MD;  Location: [facility];  Service: Gastroenterology;;  ? BREAST LUMPECTOMY    ? CHOLECYSTECTOMY    ? COLONOSCOPY WITH PROPOFOL N/A 10/19/2019  ? Procedure: COLONOSCOPY WITH PROPOFOL;  Surgeon: [name], MD;  Location: [facility];  Service: Gastroenterology;  Laterality: N/A;  ? ESOPHAGOGASTRODUODENOSCOPY (EGD) WITH PROPOFOL N/A 10/19/2019  ? Procedure: ESOPHAGOGASTRODUODENOSCOPY (EGD) WITH PROPOFOL;  Surgeon: [name], MD;  Location: [facility];  Service: Gastroenterology;  Laterality: N/A;  ? POLYPECTOMY  10/19/2019  ? Procedure: POLYPECTOMY;  Surgeon: [name], MD;  Location: [facility];  Service: Gastroenterology;;  ? ? ?  Current Medications: ?Current Meds  ?Medication Sig  ? acetaminophen (TYLENOL) 325 MG tablet Take 2 tablets (650 mg total) by mouth every 4 (four) hours as needed for mild pain (or temp > 37.5 C (99.5 F)).  ? clotrimazole-betamethasone (LOTRISONE) cream APPLY TO AFFECTED AREA TOPICALLY TWICE A DAY FOR 14 DAYS  ? esomeprazole (NEXIUM) 20 MG capsule Take 1 capsule (20 mg total) by mouth daily.  ? furosemide (LASIX) 20 MG tablet Take 20 mg by mouth daily as  needed for fluid.   ? gabapentin (NEURONTIN) 100 MG capsule Take 100 mg by mouth 3 (three) times daily.  ? levothyroxine (SYNTHROID) 112 MCG tablet Take 1 tablet (112 mcg total) by mouth every morning.  ? metoprolol succinate (TOPROL-XL) 50 MG 24 hr tablet Take 1 tablet (50 mg total) by mouth daily. Take with or immediately following a meal.  ? PRADAXA 150 MG CAPS capsule TAKE 1 CAPSULE BY MOUTH TWICE A DAY  ? sertraline (ZOLOFT) 50 MG tablet Take 1 tablet (50 mg total) by mouth daily.  ?  ? ?Allergies:   Sulfa antibiotics  ? ?Social History  ? ?Socioeconomic History  ? Marital status: Married  ?  Spouse name: Not on file  ? Number of children: Not on file  ? Years of education: Not on file  ? Highest education level: Not on file  ?Occupational History  ? Not on file  ?Tobacco Use  ? Smoking status: Former  ?  Types: Cigarettes  ?  Quit date: 08/31/1980  ?  Years since quitting: 40.6  ? Smokeless tobacco: Never  ? Tobacco comments:  ?  quit over 20 Years ago  ?Vaping Use  ? Vaping Use: Never used  ?Substance and Sexual Activity  ? Alcohol use: Yes  ?  Comment: occasionally  ? Drug use: Never  ? Sexual activity: Not Currently  ?Other Topics Concern  ? Not on file  ?Social History Narrative  ? Lives alone  ? Right Handed   ? Drinks 2 cups caffeine daily  ? ?Social Determinants of Health  ? ?Financial Resource Strain: Not on file  ?Food Insecurity: Not on file  ?Transportation Needs: Not on file  ?Physical Activity: Not on file  ?Stress: Not on file  ?Social Connections: Not on file  ?  ? ?Family History: ?The patient's family history is not on file. ? ?ROS:   ?Please see the history of present illness.    ?All other systems reviewed and are negative. ? ?EKGs/Labs/Other Studies Reviewed:   ? ?EKG:  EKG is ordered today.  The ekg ordered today demonstrates AF with HR 76bpm  ? ?Recent Labs: ?08/31/2020: ALT 12 ?09/01/2020: Hemoglobin 11.1; Platelets 180 ?09/02/2020: BUN 20; Creatinine, Ser 0.59; Magnesium 2.0; Potassium 4.3;  Sodium 137  ?Recent Lipid Panel ?   ?Component Value Date/Time  ? CHOL 134 09/01/2020 0434  ? TRIG 65 09/01/2020 0434  ? HDL 48 09/01/2020 0434  ? CHOLHDL 2.8 09/01/2020 0434  ? VLDL 13 09/01/2020 0434  ? [place] 73 09/01/2020 0434  ? ?Risk Assessment/Calculations:   ? ?CHA2DS2-VASc Score = 6  ?This indicates a 9.7% annual risk of stroke. ?The patient's score is based upon: ?CHF History: 0 ?HTN History: 1 ?Diabetes History: 0 ?Stroke History: 2 ?Vascular Disease History: 0 ?Age Score: 2 ?Gender Score: 1 ? ?HAS-BLED score 3 ?Hypertension (Uncontrolled in 30 days)  No  ?Abnormal renal and liver function (Dialysis, transplant, Cr >2.26 mg/dL /Cirrhosis or Bilirubin >2x Normal or AST/ALT/AP >3x Normal) No  ?  Stroke Yes  ?Bleeding Yes  ?Labile INR (Unstable/high INR) No  ?Elderly (>65) Yes  ?Drugs or alcohol (? 8 drinks/week, anti-plt or NSAID) No ? ?Physical Exam:   ? ?VS:  BP 126/62 (BP Location: Right Arm, Patient Position: Sitting, Cuff Size: Large)   Pulse 76   Ht '5\' 2"'$  (1.575 m)   Wt 166 lb (75.3 kg)   SpO2 96%   BMI 30.36 kg/m?    ? ?Wt Readings from Last 3 Encounters:  ?04/30/21 166 lb (75.3 kg)  ?04/20/21 166 lb 12.8 oz (75.7 kg)  ?10/31/20 165 lb (74.8 kg)  ?  ?General: Well developed, well nourished, NAD ?Lungs:Clear to ausculation bilaterally. No wheezes, rales, or rhonchi. Breathing is unlabored. ?Cardiovascular: Irregularly irregular. No murmurs ?Extremities: Trace edema.  ?Neuro: Alert and oriented. No focal deficits. No facial asymmetry. MAE spontaneously. ?Psych: Responds to questions appropriately with normal affect.   ? ?ASSESSMENT/PLAN:   ? ?Atrial fibrillation: Initially evaluated for Watchman with [name] 02/2020 however deferred the procedure. She unfortunately then suffered an ischemic CVA in the left MCA territory. She has undergone extensive PT/OT/ST and has done quite well. She was re-referred to [name] for [facility] after recovery for long term stroke prevention. Patients daughter  recorded initial conversation which reviewed the risks/benefits of Watchman implant which they have both re-listened to this earlier this week. All questions have been answered. She reports complete complian

## 2021-04-30 ENCOUNTER — Other Ambulatory Visit: Payer: Self-pay

## 2021-04-30 ENCOUNTER — Telehealth: Payer: Self-pay

## 2021-04-30 ENCOUNTER — Ambulatory Visit: Payer: Medicare Other | Admitting: Cardiology

## 2021-04-30 ENCOUNTER — Ambulatory Visit: Payer: Medicare Other | Admitting: Adult Health

## 2021-04-30 ENCOUNTER — Encounter: Payer: Self-pay | Admitting: Adult Health

## 2021-04-30 VITALS — BP 123/97 | HR 91 | Ht 62.0 in | Wt 165.0 lb

## 2021-04-30 VITALS — BP 126/62 | HR 76 | Ht 62.0 in | Wt 166.0 lb

## 2021-04-30 DIAGNOSIS — I10 Essential (primary) hypertension: Secondary | ICD-10-CM

## 2021-04-30 DIAGNOSIS — I482 Chronic atrial fibrillation, unspecified: Secondary | ICD-10-CM | POA: Diagnosis not present

## 2021-04-30 DIAGNOSIS — I63412 Cerebral infarction due to embolism of left middle cerebral artery: Secondary | ICD-10-CM | POA: Diagnosis not present

## 2021-04-30 DIAGNOSIS — I63512 Cerebral infarction due to unspecified occlusion or stenosis of left middle cerebral artery: Secondary | ICD-10-CM | POA: Diagnosis not present

## 2021-04-30 DIAGNOSIS — D649 Anemia, unspecified: Secondary | ICD-10-CM

## 2021-04-30 NOTE — Progress Notes (Signed)
CHA2DS2VASc =       ?

## 2021-04-30 NOTE — Patient Instructions (Signed)
Continue Pradaxa (dabigatran) twice a day for secondary stroke prevention ? ?Continue to follow closely with cardiologist for atrial fibrillation management and monitoring ? ?Continue to follow up with PCP regarding cholesterol and blood pressure management  ?Maintain strict control of hypertension with blood pressure goal below 130/90 and cholesterol with LDL cholesterol (bad cholesterol) goal below 70 mg/dL.  ? ?Signs of a Stroke? Follow the BEFAST method:  ?Balance Watch for a sudden loss of balance, trouble with coordination or vertigo ?Eyes Is there a sudden loss of vision in one or both eyes? Or double vision?  ?Face: Ask the person to smile. Does one side of the face droop or is it numb?  ?Arms: Ask the person to raise both arms. Does one arm drift downward? Is there weakness or numbness of a leg? ?Speech: Ask the person to repeat a simple phrase. Does the speech sound slurred/strange? Is the person confused ? ?Time: If you observe any of these signs, call 911. ? ? ? ? ? ? ? ?Thank you for coming to see [place] at [facility]. I hope we have been able to provide you high quality care today. ? ?You may receive a patient satisfaction survey over the next few weeks. We would appreciate your feedback and comments so that we may continue to improve ourselves and the health of our patients. ? ?

## 2021-04-30 NOTE — Patient Instructions (Addendum)
Medication Instructions:  ?On Friday March, 24 take 2 tablets of your Metoprolol 2 hours before your CT. This is a one time time thing, you will resume your normal dose after your CT.  ? ?*If you need a refill on your cardiac medications before your next appointment, please call your pharmacy* ? ? ?Lab Work: ?Cbc, Bmp- today  ? ?If you have labs (blood work) drawn today and your tests are completely normal, you will receive your results only by: ?MyChart Message (if you have MyChart) OR ?A paper copy in the mail ?If you have any lab test that is abnormal or we need to change your treatment, we will call you to review the results. ? ? ?Testing/Procedures: ?You are scheduled for your watchman CT on Friday March 24 @ 11:30 AM. Please refer to instructions given  ? ? ?Follow-Up: ?We will contact you with follow up appointments  ? ? ?Other Instructions ?None ordered   ?

## 2021-04-30 NOTE — Progress Notes (Signed)
?[facility] ?[address] ?[place] [address] ?[phone] ? ?     OFFICE FOLLOW UP NOTE ? ?Katrina Randolph ?Date of Birth:  1930/12/26 ?Medical Record Number:  [id number]  ? ?Primary neurologist: Dr. Leonie Randolph ?Referring MD:  Katrina Mesi, PA-c ? ?Reason for Referral: Stroke ? ?Chief Complaint  ?Patient presents with  ? Follow-up  ?  RM 2 alone ?Pt is well and stable, no new concerns   ?  ? ?HPI:  ? ?Update 04/30/2021 JM: Returns for 4-monthstroke follow-up unaccompanied.  Overall stable from stroke standpoint without new stroke/TIA symptoms.  Reports residual right hand foot numbness and mild imbalance which has been stable. Use of cane, no recent falls. Does report pin/needle sensation hand>foot but not overly bothersome.  Great improvement of speech with only occasional word finding difficulty.  Reports recently restarting Pradaxa as she self discontinued per patient preferences and after a long conversation with her cardiologist Dr. HPercival Spanish she agreed to restart. She is planning on pursuing evaluation for Watchman device.  Denies any side effects or difficulty tolerating Pradaxa.  Blood pressure today 123/97.  No new concerns at this time.   ? ? ? ? ?History provided for reference purposes only ?Update 10/31/2020 JM: Ms. ETownerreturns for 351-monthtroke follow-up.  Initially doing well making recovery in regards to balance and speech but unfortunately had a regression after 3-day hospitalization on 08/31/2020 for pneumonia and A. fib with RVR.  She was reevaluated by therapies on 8/9 with noted regression with some improvement but not yet back to how she was prior to admission.  She was not interested in additional therapy sessions. She continues HEP routinely for residual strengthening and occasional speech difficulty. She continues to live independently maintaining ADLs and IADLs independently. Right foot numbness and occasional zap sensation in RUE. intolerant to  gabapentin therefore transition to topiramate after prior visit which she also had difficulty tolerating.  Denies new stroke/TIA symptoms.  She has since been switched to Pradaxa with concerns of Eliquis causing upper extremity and groin rash -she has been tolerating Pradaxa without difficulty.  Blood pressure today 133/68. She does complain of red itchy spots on her chest and head which have been present since around April of this year.  She is concerned this could be a medication reaction.  She has not further discussed this with her PCP.  No further concerns at this time. ? ?Consult visit 07/25/2020 Dr. SeLeonie ManMs. EaPaneks a pleasant [age]ear old Caucasian lady seen today for initial office consultation visit for stroke.  History is obtained from the patient and her daughters accompanying her as well as review of electronic medical records and I personally reviewed pertinent available imaging films in PACS.  She has a past medical history of atrial fibrillation who was not on anticoagulation or antiplatelet medications secondary to chronic anemia.  [facility] in communication and decreased right hemibody sensation and paresthesias.  The daughter also noticed some difficulty with walking and some right-sided weakness.  She presented outside the time window for tPA.  MRI scan of the brain was obtained which showed posterior division left MCA territory acute infarct with occlusion of multiple left M2 and M3 branches.  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  Transthoracic echo showed ejection fraction of 55 to 60% without any clot.  LDL cholesterol is 103  mg percent and hemoglobin A1c was 6.2.  After careful discussion about risk benefit of anticoagulation and bleeding risk and stroke prevention the family agreed to start Eliquis which was started.  Patient had previously  seen electrophysiologist [name] and had initial discussion for consideration for watchman device but she declined this after careful review of risk-benefit.  Patient states she has noticed improvement in her speech and language and is currently undergoing outpatient speech and physical therapy.  Right-sided strength is improved but she still has significant right-sided paresthesias.  She is has been started on gabapentin 100 mg 3 times daily but feels its not working plus she has noticed increased swelling in her feet.  She has not had recurrent stroke or TIA symptoms.  She however has noticed headaches since her stroke.  They are not constant but they are all the time.  At times in the front but may also be on other occasions towards the back.  She describes this as a dull ache 5/10 in severity.  There is no accompanying nausea, vomiting, light or sound sensitivity.  She is able to carry on her usual activities despite the headaches.  She takes 2 tablets of Tylenol which seems to help.  She is also noticed mild tenderness in the scalp but denies any jaw claudication, blurred vision or loss of vision or muscle aches or pains.  She is tolerating Eliquis well without bleeding but does have minor bruising.  She lives alone and daughter keeps a close watch on her. ? ? ? ?ROS:   ?14 system review of systems is positive for those listed in HPI and all other systems negative ? ?PMH:  ?Past Medical History:  ?Diagnosis Date  ? Atrial fibrillation ([place])   ? ? ?Social History:  ?Social History  ? ?Socioeconomic History  ? Marital status: Married  ?  Spouse name: Not on file  ? Number of children: Not on file  ? Years of education: Not on file  ? Highest education level: Not on file  ?Occupational History  ? Not on file  ?Tobacco Use  ? Smoking status: Former  ?  Types: Cigarettes  ?  Quit date: 08/31/1980  ?  Years since quitting: 40.6  ? Smokeless tobacco: Never  ? Tobacco comments:  ?  quit over 20 Years ago  ?Vaping  Use  ? Vaping Use: Never used  ?Substance and Sexual Activity  ? Alcohol use: Yes  ?  Comment: occasionally  ? Drug use: Never  ? Sexual activity: Not Currently  ?Other Topics Concern  ? Not on file  ?Social History Narrative  ? Lives alone  ? Right Handed   ? Drinks 2 cups caffeine daily  ? ?Social Determinants of Health  ? ?Financial Resource Strain: Not on file  ?Food Insecurity: Not on file  ?Transportation Needs: Not on file  ?Physical Activity: Not on file  ?Stress: Not on file  ?Social Connections: Not on file  ?Intimate Partner Violence: Not on file  ? ? ?Medications:   ?Current Outpatient Medications on File Prior to Visit  ?Medication Sig Dispense Refill  ? acetaminophen (TYLENOL) 325 MG tablet Take 2 tablets (650 mg total) by mouth every 4 (four) hours as needed for mild pain (or temp > 37.5 C (99.5 F)).    ? clotrimazole-betamethasone (LOTRISONE) cream APPLY TO AFFECTED AREA TOPICALLY TWICE A DAY FOR 14 DAYS    ? esomeprazole (NEXIUM) 20 MG capsule Take 1 capsule (20 mg total) by mouth daily.  30 capsule 0  ? furosemide (LASIX) 20 MG tablet Take 20 mg by mouth daily as needed for fluid.     ? gabapentin (NEURONTIN) 100 MG capsule Take 100 mg by mouth 3 (three) times daily.    ? levothyroxine (SYNTHROID) 112 MCG tablet Take 1 tablet (112 mcg total) by mouth every morning. 30 tablet 0  ? metoprolol succinate (TOPROL-XL) 50 MG 24 hr tablet Take 1 tablet (50 mg total) by mouth daily. Take with or immediately following a meal. 90 tablet 3  ? PRADAXA 150 MG CAPS capsule TAKE 1 CAPSULE BY MOUTH TWICE A DAY 60 capsule 3  ? sertraline (ZOLOFT) 50 MG tablet Take 1 tablet (50 mg total) by mouth daily. 30 tablet 0  ? ?No current facility-administered medications on file prior to visit.  ? ? ?Allergies:   ?Allergies  ?Allergen Reactions  ? Sulfa Antibiotics   ?  "feels bad"  ? ? ?Physical Exam ?Today's Vitals  ? 04/30/21 1434  ?BP: (!) 123/97  ?Pulse: 91  ?Weight: 165 lb (74.8 kg)  ?Height: '5\' 2"'$  (1.575 m)  ? ?Body  mass index is 30.18 kg/m?.  ? ?General: Mildly obese pleasant elderly Caucasian lady, seated, in no evident distress ?Head: head normocephalic and atraumatic.   ?Neck: supple with no carotid or supraclavicular b

## 2021-05-01 LAB — CBC
Hematocrit: 36.3 % (ref 34.0–46.6)
Hemoglobin: 11.6 g/dL (ref 11.1–15.9)
MCH: 27.7 pg (ref 26.6–33.0)
MCHC: 32 g/dL (ref 31.5–35.7)
MCV: 87 fL (ref 79–97)
Platelets: 200 10*3/uL (ref 150–450)
RBC: 4.19 x10E6/uL (ref 3.77–5.28)
RDW: 14.3 % (ref 11.7–15.4)
WBC: 7 10*3/uL (ref 3.4–10.8)

## 2021-05-01 LAB — BASIC METABOLIC PANEL
BUN/Creatinine Ratio: 16 (ref 12–28)
BUN: 15 mg/dL (ref 8–27)
CO2: 20 mmol/L (ref 20–29)
Calcium: 8.7 mg/dL (ref 8.7–10.3)
Chloride: 108 mmol/L — ABNORMAL HIGH (ref 96–106)
Creatinine, Ser: 0.95 mg/dL (ref 0.57–1.00)
Glucose: 105 mg/dL — ABNORMAL HIGH (ref 70–99)
Potassium: 4.7 mmol/L (ref 3.5–5.2)
Sodium: 146 mmol/L — ABNORMAL HIGH (ref 134–144)
eGFR: 58 mL/min/{1.73_m2} — ABNORMAL LOW (ref >59)

## 2021-05-03 ENCOUNTER — Telehealth (HOSPITAL_COMMUNITY): Payer: Self-pay | Admitting: Emergency Medicine

## 2021-05-03 NOTE — Telephone Encounter (Signed)
Reaching out to patient to offer assistance regarding upcoming cardiac imaging study; pt verbalizes understanding of appt date/time, parking situation and where to check in, pre-test NPO status and medications ordered, and verified current allergies; name and call back number provided for further questions should they arise ?Marchia Bond RN Navigator Cardiac Imaging ?[facility] ?[phone] office ?[phone] cell ? ?Denies iv issues ?Taking extra dose of metoprolol 2 hr prior to scan ?Arrival 1100 ?

## 2021-05-04 ENCOUNTER — Other Ambulatory Visit: Payer: Self-pay

## 2021-05-04 ENCOUNTER — Ambulatory Visit (HOSPITAL_COMMUNITY)
Admission: RE | Admit: 2021-05-04 | Discharge: 2021-05-04 | Disposition: A | Discharge status: Home / Self Care | Payer: Medicare Other | Source: Ambulatory Visit | Attending: Cardiology | Admitting: Cardiology

## 2021-05-04 DIAGNOSIS — I482 Chronic atrial fibrillation, unspecified: Secondary | ICD-10-CM | POA: Diagnosis present

## 2021-05-04 MED ORDER — IOHEXOL 350 MG/ML SOLN
100.0000 mL | Freq: Once | INTRAVENOUS | Status: AC | PRN
  Administered 2021-05-04: 100 mL via INTRAVENOUS

## 2021-05-08 ENCOUNTER — Other Ambulatory Visit: Payer: Self-pay

## 2021-05-08 ENCOUNTER — Telehealth: Payer: Self-pay

## 2021-05-08 DIAGNOSIS — D649 Anemia, unspecified: Secondary | ICD-10-CM

## 2021-05-08 DIAGNOSIS — I4891 Unspecified atrial fibrillation: Secondary | ICD-10-CM

## 2021-05-08 NOTE — Telephone Encounter (Signed)
-----   Message from [name], MD sent at 05/07/2021 10:21 PM EDT ----- ?Ok to proceed with watchman evaluation. ?CL ? ?----- Message ----- ?From: Interface, Rad Results In ?Sent: 05/06/2021   4:42 PM EDT ?To: [name], MD ? ? ?

## 2021-05-08 NOTE — Telephone Encounter (Signed)
Per [name], informed patient her cardiac anatomy is suitable for LAAO. ?She agrees to [place] date 05/24/2021. ?Will send instructions via MyChart and in the mail per request. She understands she will be called closer to date to review instructions. ?She was grateful for call and agrees with plan.  ?

## 2021-05-18 ENCOUNTER — Telehealth: Payer: Self-pay

## 2021-05-18 NOTE — Telephone Encounter (Signed)
Confirmed 0900 arrival time for 1130 Watchman procedure next Thursday, 4/13. ?The patient understands medication instructions and has no questions or concerns. ?She will call prior to procedure if questions arise. ?She was grateful for call. ?

## 2021-05-24 ENCOUNTER — Inpatient Hospital Stay (HOSPITAL_COMMUNITY): Payer: Medicare Other | Admitting: Certified Registered"

## 2021-05-24 ENCOUNTER — Other Ambulatory Visit: Payer: Self-pay

## 2021-05-24 ENCOUNTER — Inpatient Hospital Stay (HOSPITAL_COMMUNITY)
Admission: RE | Admit: 2021-05-24 | Discharge: 2021-05-25 | DRG: 274 | Disposition: A | Discharge status: Home / Self Care | Payer: Medicare Other | Source: Ambulatory Visit | Attending: Cardiology | Admitting: Cardiology

## 2021-05-24 ENCOUNTER — Inpatient Hospital Stay (HOSPITAL_COMMUNITY): Payer: Medicare Other

## 2021-05-24 ENCOUNTER — Other Ambulatory Visit: Payer: Self-pay | Admitting: Cardiology

## 2021-05-24 ENCOUNTER — Encounter (HOSPITAL_COMMUNITY)
Admission: RE | Disposition: A | Discharge status: Home / Self Care | Payer: Self-pay | Source: Ambulatory Visit | Attending: Cardiology

## 2021-05-24 ENCOUNTER — Inpatient Hospital Stay (HOSPITAL_COMMUNITY)
Admission: RE | Admit: 2021-05-24 | Discharge: 2021-05-24 | Disposition: A | Discharge status: Home / Self Care | Payer: Medicare Other | Source: Ambulatory Visit | Attending: Cardiology | Admitting: Cardiology

## 2021-05-24 ENCOUNTER — Encounter (HOSPITAL_COMMUNITY): Payer: Self-pay | Admitting: Cardiology

## 2021-05-24 DIAGNOSIS — Z79899 Other long term (current) drug therapy: Secondary | ICD-10-CM

## 2021-05-24 DIAGNOSIS — Z8673 Personal history of transient ischemic attack (TIA), and cerebral infarction without residual deficits: Secondary | ICD-10-CM | POA: Diagnosis not present

## 2021-05-24 DIAGNOSIS — I4891 Unspecified atrial fibrillation: Secondary | ICD-10-CM

## 2021-05-24 DIAGNOSIS — Z87891 Personal history of nicotine dependence: Secondary | ICD-10-CM

## 2021-05-24 DIAGNOSIS — Z006 Encounter for examination for normal comparison and control in clinical research program: Secondary | ICD-10-CM

## 2021-05-24 DIAGNOSIS — J189 Pneumonia, unspecified organism: Secondary | ICD-10-CM | POA: Diagnosis not present

## 2021-05-24 DIAGNOSIS — Z95818 Presence of other cardiac implants and grafts: Principal | ICD-10-CM

## 2021-05-24 DIAGNOSIS — I639 Cerebral infarction, unspecified: Secondary | ICD-10-CM | POA: Diagnosis present

## 2021-05-24 DIAGNOSIS — I10 Essential (primary) hypertension: Secondary | ICD-10-CM

## 2021-05-24 DIAGNOSIS — E039 Hypothyroidism, unspecified: Secondary | ICD-10-CM | POA: Diagnosis not present

## 2021-05-24 DIAGNOSIS — I480 Paroxysmal atrial fibrillation: Secondary | ICD-10-CM | POA: Diagnosis present

## 2021-05-24 DIAGNOSIS — D509 Iron deficiency anemia, unspecified: Secondary | ICD-10-CM | POA: Diagnosis present

## 2021-05-24 DIAGNOSIS — I4821 Permanent atrial fibrillation: Secondary | ICD-10-CM | POA: Diagnosis present

## 2021-05-24 DIAGNOSIS — Z79890 Hormone replacement therapy (postmenopausal): Secondary | ICD-10-CM

## 2021-05-24 DIAGNOSIS — K219 Gastro-esophageal reflux disease without esophagitis: Secondary | ICD-10-CM | POA: Diagnosis present

## 2021-05-24 DIAGNOSIS — D649 Anemia, unspecified: Secondary | ICD-10-CM | POA: Diagnosis present

## 2021-05-24 DIAGNOSIS — Z882 Allergy status to sulfonamides status: Secondary | ICD-10-CM

## 2021-05-24 DIAGNOSIS — E785 Hyperlipidemia, unspecified: Secondary | ICD-10-CM | POA: Diagnosis present

## 2021-05-24 HISTORY — PX: TEE WITHOUT CARDIOVERSION: SHX5443

## 2021-05-24 HISTORY — DX: Presence of other cardiac implants and grafts: Z95.818

## 2021-05-24 HISTORY — PX: LEFT ATRIAL APPENDAGE OCCLUSION: EP1229

## 2021-05-24 LAB — TYPE AND SCREEN
ABO/RH(D): A POS
Antibody Screen: NEGATIVE

## 2021-05-24 LAB — POCT ACTIVATED CLOTTING TIME: Activated Clotting Time: 365 seconds

## 2021-05-24 LAB — SURGICAL PCR SCREEN
MRSA, PCR: NEGATIVE
Staphylococcus aureus: NEGATIVE

## 2021-05-24 SURGERY — LEFT ATRIAL APPENDAGE OCCLUSION
Anesthesia: General

## 2021-05-24 MED ORDER — SERTRALINE HCL 25 MG PO TABS
50.0000 mg | ORAL_TABLET | Freq: Every day | ORAL | Status: DC
  Administered 2021-05-25: 50 mg via ORAL
  Filled 2021-05-24: qty 2

## 2021-05-24 MED ORDER — FENTANYL CITRATE (PF) 100 MCG/2ML IJ SOLN
INTRAMUSCULAR | Status: DC | PRN
  Administered 2021-05-24: 50 ug via INTRAVENOUS

## 2021-05-24 MED ORDER — SODIUM CHLORIDE 0.9 % IV SOLN: INTRAVENOUS | Status: DC

## 2021-05-24 MED ORDER — SUGAMMADEX SODIUM 200 MG/2ML IV SOLN
INTRAVENOUS | Status: DC | PRN
  Administered 2021-05-24: 200 mg via INTRAVENOUS

## 2021-05-24 MED ORDER — METOPROLOL SUCCINATE ER 50 MG PO TB24
50.0000 mg | ORAL_TABLET | Freq: Every day | ORAL | Status: DC
  Administered 2021-05-25: 50 mg via ORAL
  Filled 2021-05-24: qty 1

## 2021-05-24 MED ORDER — SODIUM CHLORIDE 0.9% FLUSH
3.0000 mL | Freq: Two times a day (BID) | INTRAVENOUS | Status: DC
  Administered 2021-05-24 (×2): 3 mL via INTRAVENOUS

## 2021-05-24 MED ORDER — CEFAZOLIN SODIUM-DEXTROSE 2-4 GM/100ML-% IV SOLN
INTRAVENOUS | Status: AC
  Filled 2021-05-24: qty 100

## 2021-05-24 MED ORDER — ACETAMINOPHEN 325 MG PO TABS
650.0000 mg | ORAL_TABLET | ORAL | Status: DC | PRN
  Administered 2021-05-24: 650 mg via ORAL
  Filled 2021-05-24: qty 2

## 2021-05-24 MED ORDER — LIDOCAINE 2% (20 MG/ML) 5 ML SYRINGE
INTRAMUSCULAR | Status: DC | PRN
  Administered 2021-05-24: 60 mg via INTRAVENOUS

## 2021-05-24 MED ORDER — DEXAMETHASONE SODIUM PHOSPHATE 10 MG/ML IJ SOLN
INTRAMUSCULAR | Status: DC | PRN
  Administered 2021-05-24: 5 mg via INTRAVENOUS

## 2021-05-24 MED ORDER — MIDAZOLAM HCL 2 MG/2ML IJ SOLN
INTRAMUSCULAR | Status: DC | PRN
Start: 2021-05-24 — End: 2021-05-24
  Administered 2021-05-24: 1 mg via INTRAVENOUS

## 2021-05-24 MED ORDER — PROTAMINE SULFATE 10 MG/ML IV SOLN
INTRAVENOUS | Status: DC | PRN
  Administered 2021-05-24: 35 mg via INTRAVENOUS

## 2021-05-24 MED ORDER — HEPARIN (PORCINE) IN NACL 1000-0.9 UT/500ML-% IV SOLN
INTRAVENOUS | Status: AC
  Filled 2021-05-24: qty 500

## 2021-05-24 MED ORDER — PROPOFOL 10 MG/ML IV BOLUS
INTRAVENOUS | Status: DC | PRN
  Administered 2021-05-24: 90 mg via INTRAVENOUS

## 2021-05-24 MED ORDER — SODIUM CHLORIDE 0.9 % IV SOLN: 250.0000 mL | INTRAVENOUS | Status: DC | PRN

## 2021-05-24 MED ORDER — HEPARIN SODIUM (PORCINE) 1000 UNIT/ML IJ SOLN
INTRAMUSCULAR | Status: DC | PRN
  Administered 2021-05-24: 2000 [IU] via INTRAVENOUS

## 2021-05-24 MED ORDER — HEPARIN SODIUM (PORCINE) 1000 UNIT/ML IJ SOLN
INTRAMUSCULAR | Status: DC | PRN
  Administered 2021-05-24: 11000 [IU] via INTRAVENOUS

## 2021-05-24 MED ORDER — HEPARIN SODIUM (PORCINE) 1000 UNIT/ML IJ SOLN
INTRAMUSCULAR | Status: AC
  Filled 2021-05-24: qty 10

## 2021-05-24 MED ORDER — IOHEXOL 350 MG/ML SOLN
INTRAVENOUS | Status: DC | PRN
  Administered 2021-05-24: 20 mL via INTRA_ARTERIAL

## 2021-05-24 MED ORDER — CHLORHEXIDINE GLUCONATE 0.12 % MT SOLN
15.0000 mL | Freq: Once | OROMUCOSAL | Status: AC
  Filled 2021-05-24: qty 15

## 2021-05-24 MED ORDER — GABAPENTIN 100 MG PO CAPS
100.0000 mg | ORAL_CAPSULE | Freq: Two times a day (BID) | ORAL | Status: DC
  Administered 2021-05-24 – 2021-05-25 (×3): 100 mg via ORAL
  Filled 2021-05-24 (×3): qty 1

## 2021-05-24 MED ORDER — LEVOTHYROXINE SODIUM 112 MCG PO TABS
112.0000 ug | ORAL_TABLET | Freq: Every morning | ORAL | Status: DC
  Administered 2021-05-25: 112 ug via ORAL
  Filled 2021-05-24: qty 1

## 2021-05-24 MED ORDER — ROCURONIUM BROMIDE 10 MG/ML (PF) SYRINGE
PREFILLED_SYRINGE | INTRAVENOUS | Status: DC | PRN
Start: 2021-05-24 — End: 2021-05-24
  Administered 2021-05-24: 50 mg via INTRAVENOUS

## 2021-05-24 MED ORDER — SODIUM CHLORIDE 0.9% FLUSH: 3.0000 mL | INTRAVENOUS | Status: DC | PRN

## 2021-05-24 MED ORDER — CEFAZOLIN SODIUM-DEXTROSE 2-4 GM/100ML-% IV SOLN
2.0000 g | INTRAVENOUS | Status: AC
  Administered 2021-05-24: 2 g via INTRAVENOUS

## 2021-05-24 MED ORDER — DABIGATRAN ETEXILATE MESYLATE 150 MG PO CAPS
150.0000 mg | ORAL_CAPSULE | Freq: Two times a day (BID) | ORAL | Status: DC
  Administered 2021-05-24 – 2021-05-25 (×2): 150 mg via ORAL
  Filled 2021-05-24 (×2): qty 1

## 2021-05-24 MED ORDER — CHLORHEXIDINE GLUCONATE 0.12 % MT SOLN
OROMUCOSAL | Status: AC
  Administered 2021-05-24: 15 mL
  Filled 2021-05-24: qty 15

## 2021-05-24 MED ORDER — ONDANSETRON HCL 4 MG/2ML IJ SOLN: 4.0000 mg | Freq: Four times a day (QID) | INTRAMUSCULAR | Status: DC | PRN

## 2021-05-24 MED ORDER — ONDANSETRON HCL 4 MG/2ML IJ SOLN
INTRAMUSCULAR | Status: DC | PRN
  Administered 2021-05-24: 4 mg via INTRAVENOUS

## 2021-05-24 MED ORDER — HEPARIN (PORCINE) IN NACL 1000-0.9 UT/500ML-% IV SOLN
INTRAVENOUS | Status: DC | PRN
  Administered 2021-05-24: 500 mL

## 2021-05-24 SURGICAL SUPPLY — 18 items
CATH INFINITI 5FR ANG PIGTAIL (CATHETERS) ×2 IMPLANT
CLOSURE PERCLOSE PROSTYLE (VASCULAR PRODUCTS) ×2 IMPLANT
DEVICE WATCHMAN FLX PROC (KITS) IMPLANT
DILATOR VESSEL 38 20CM 11FR (INTRODUCER) ×1 IMPLANT
ELECT DEFIB PAD ADLT CADENCE (PAD) ×1 IMPLANT
KIT HEART LEFT (KITS) ×2 IMPLANT
KIT SHEA VERSACROSS LAAC CONNE (KITS) ×1 IMPLANT
PACK CARDIAC CATHETERIZATION (CUSTOM PROCEDURE TRAY) ×2 IMPLANT
SHEATH PERFORMER 16FR 30 (SHEATH) ×1 IMPLANT
SHEATH PINNACLE 8F 10CM (SHEATH) ×1 IMPLANT
SHEATH PROBE COVER 6X72 (BAG) ×2 IMPLANT
SYS WATCHMAN FXD DBL (SHEATH) ×2
SYSTEM WATCHMAN FXD DBL (SHEATH) IMPLANT
TRANSDUCER W/STOPCOCK (MISCELLANEOUS) ×2 IMPLANT
TUBING CIL FLEX 10 FLL-RA (TUBING) ×2 IMPLANT
WATCHMAN FLX 27 (Prosthesis & Implant Heart) ×1 IMPLANT
WATCHMAN FLX PROCEDURE DEVICE (KITS) ×2 IMPLANT
WATCHMAN PROCED TRUSEAL ACCESS (SHEATH) ×1 IMPLANT

## 2021-05-24 NOTE — Transfer of Care (Signed)
Immediate Anesthesia Transfer of Care Note ? ?Patient: Katrina Randolph ? ?Procedure(s) Performed: LEFT ATRIAL APPENDAGE OCCLUSION ?TRANSESOPHAGEAL ECHOCARDIOGRAM (TEE) ? ?Patient Location: Cath Lab ? ?Anesthesia Type:General ? ?Level of Consciousness: drowsy and patient cooperative ? ?Airway & Oxygen Therapy: Patient Spontanous Breathing and Patient connected to nasal cannula oxygen ? ?Post-op Assessment: Report given to RN and Patient moving all extremities ? ?Post vital signs: Reviewed and stable ? ?Last Vitals:  ?Vitals Value Taken Time  ?BP    ?Temp    ?Pulse 85 05/24/21 1324  ?Resp 13 05/24/21 1324  ?SpO2 89 % 05/24/21 1324  ?Vitals shown include unvalidated device data. ? ?Last Pain:  ?Vitals:  ? 05/24/21 0933  ?TempSrc:   ?PainSc: 0-No pain  ?   ? ?  ? ?Complications: There were no known notable events for this encounter. ?

## 2021-05-24 NOTE — Anesthesia Procedure Notes (Signed)
Procedure Name: Intubation ?Date/Time: 05/24/2021 12:29 PM ?Performed by: Moshe Salisbury, CRNA ?Pre-anesthesia Checklist: Patient identified, Emergency Drugs available, Suction available and Patient being monitored ?Patient Re-evaluated:Patient Re-evaluated prior to induction ?Oxygen Delivery Method: Circle System Utilized ?Preoxygenation: Pre-oxygenation with 100% oxygen ?Induction Type: IV induction ?Ventilation: Mask ventilation without difficulty ?Laryngoscope Size: Mac and 3 ?Grade View: Grade II ?Tube type: Oral ?Tube size: 7.5 mm ?Number of attempts: 1 ?Airway Equipment and Method: Stylet ?Placement Confirmation: ETT inserted through vocal cords under direct vision, positive ETCO2 and breath sounds checked- equal and bilateral ?Secured at: 22 cm ?Tube secured with: Tape ?Dental Injury: Teeth and Oropharynx as per pre-operative assessment  ? ? ? ? ?

## 2021-05-24 NOTE — Anesthesia Preprocedure Evaluation (Addendum)
Anesthesia Evaluation  ?Patient identified by MRN, date of birth, ID band ?Patient awake ? ? ? ?Reviewed: ?Allergy & Precautions, NPO status , Patient's Chart, lab work & pertinent test results, reviewed documented beta blocker date and time  ? ?Airway ?Mallampati: II ? ?TM Distance: >3 FB ?Neck ROM: Full ? ? ? Dental ?no notable dental hx. ?(+) Teeth Intact, Caps, Dental Advisory Given ?  ?Pulmonary ?pneumonia, resolved, former smoker,  ?  ?Pulmonary exam normal ?breath sounds clear to auscultation ? ? ? ? ? ? Cardiovascular ?hypertension, Pt. on medications and Pt. on home beta blockers ?+ dysrhythmias Atrial Fibrillation  ?Rhythm:Irregular Rate:Normal ? ?EKG 04/30/21 ?Atrial fibrillation ? ?Echo 05/31/20 ??1. Left ventricular ejection fraction, by estimation, is 55 to 60%. The left ventricle has normal function. The left ventricle has no regional wall motion abnormalities. Left ventricular diastolic function could not be evaluated.  ??2. Right ventricular systolic function is moderately reduced. The right ventricular size is mildly enlarged. There is severely elevated pulmonary artery systolic pressure. The estimated right ventricular systolic  ?pressure is 78.6 mmHg.  ??3. Left atrial size was severely dilated.  ??4. Right atrial size was severely dilated.  ??5. The pericardial effusion is posterior to the left ventricle.  ??6. The mitral valve is normal in structure. Mild mitral valve  ?regurgitation. No evidence of mitral stenosis.  ??7. Tricuspid valve regurgitation is mild to moderate.  ??8. The aortic valve is normal in structure. Aortic valve regurgitation is not visualized. No aortic stenosis is present.  ??9. The inferior vena cava is dilated in size with >50% respiratory variability, suggesting right atrial pressure of 8 mmHg.  ? ?Peripheral edema ?  ?Neuro/Psych ?PSYCHIATRIC DISORDERS Depression CVA, No Residual Symptoms   ? GI/Hepatic ?Neg liver ROS, GERD  Medicated and  Controlled,  ?Endo/Other  ?Hypothyroidism Obesity ? ? Renal/GU ?negative Renal ROS  ?negative genitourinary ?  ?Musculoskeletal ? ?(+) Arthritis , Osteoarthritis,   ? Abdominal ?(+) + obese,   ?Peds ? Hematology ? ?(+) Blood dyscrasia, anemia , Pradaxa therapy- last dose 4/10   ?Anesthesia Other Findings ? ? Reproductive/Obstetrics ? ?  ? ? ? ? ? ? ? ? ? ? ? ? ? ?  ?  ? ? ? ? ? ? ? ?Anesthesia Physical ?Anesthesia Plan ? ?ASA: 3 ? ?Anesthesia Plan: General  ? ?Post-op Pain Management:   ? ?Induction: Intravenous ? ?PONV Risk Score and Plan: 3 and Treatment may vary due to age or medical condition and Ondansetron ? ?Airway Management Planned: Oral ETT ? ?Additional Equipment: Arterial line ? ?Intra-op Plan:  ? ?Post-operative Plan: Extubation in OR ? ?Informed Consent: I have reviewed the patients History and Physical, chart, labs and discussed the procedure including the risks, benefits and alternatives for the proposed anesthesia with the patient or authorized representative who has indicated his/her understanding and acceptance.  ? ? ? ?Dental advisory given ? ?Plan Discussed with: CRNA and Anesthesiologist ? ?Anesthesia Plan Comments:   ? ? ? ? ? ? ?Anesthesia Quick Evaluation ? ?

## 2021-05-24 NOTE — H&P (Signed)
?  ?Watchman Consult Note ?  ?  ?Date:  05/24/2021  ?  ?ID:  Katrina Randolph, DOB 1930-08-28, MRN [id number] ?  ?PCP:  [name], PA-C         ?Primary Electrophysiologist: Lars Mage, MD ?Referring Physician: [name]    ?  ?Chief complaint: atrial fibrillation and coagulation management; Watchman implant  ?  ?History of Present Illness: ?Katrina Randolph is a 88 y.o. female referred by Dr Percival Spanish for evaluation of atrial fibrillation and stroke prevention. She has permanent atrial fibrillation as well as chronic anemia requiring transfusions.  The patient has been evaluated by their referring physician and is felt to be a poor candidate for long term [place] due to chronic anemia.  She therefore presents today for Watchman evaluation.  ?  ?Today, she denies symptoms of palpitations, chest pain, shortness of breath, orthopnea, PND, lower extremity edema, claudication, dizziness, presyncope, syncope, bleeding, or neurologic sequela. The patient is tolerating medications without difficulties and is otherwise without complaint today.  ?  ?She presented to the clinic today with her daughter who is actively involved in her health care.  She tells me that she has been hospitalized multiple times for her symptomatic anemia.  She was admitted most recently in September 2021.  GI was consulted during the hospitalization who performed a colonoscopy and EGD.  There was no clear source of the bleeding.  She had polyps, diverticulosis, a hiatal hernia and Jesalyn Finazzo erosions.  Her course has also been complicated by orthostatic hypotension but thankfully she has not had any syncopal episodes.  From talking to the patient and her daughter during today's visit, there have been a lot of stressors in her life with her husband's passing.  Her husband had a history of a significant stroke. ?  ?Presents today for LAAO.  ?  ?    ?Past Medical History:  ?Diagnosis Date  ? Atrial fibrillation ([place])    ?  ?     ?Past  Surgical History:  ?Procedure Laterality Date  ? ABDOMINAL HYSTERECTOMY      ? BIOPSY   10/19/2019  ?  Procedure: BIOPSY;  Surgeon: [name], MD;  Location: [facility];  Service: Gastroenterology;;  ? BREAST LUMPECTOMY      ? CHOLECYSTECTOMY      ? COLONOSCOPY WITH PROPOFOL N/A 10/19/2019  ?  Procedure: COLONOSCOPY WITH PROPOFOL;  Surgeon: [name], MD;  Location: [facility];  Service: Gastroenterology;  Laterality: N/A;  ? ESOPHAGOGASTRODUODENOSCOPY (EGD) WITH PROPOFOL N/A 10/19/2019  ?  Procedure: ESOPHAGOGASTRODUODENOSCOPY (EGD) WITH PROPOFOL;  Surgeon: [name], MD;  Location: [facility];  Service: Gastroenterology;  Laterality: N/A;  ? POLYPECTOMY   10/19/2019  ?  Procedure: POLYPECTOMY;  Surgeon: [name], MD;  Location: [facility];  Service: Gastroenterology;;  ?  ?  ?  ?      ?Current Outpatient Medications  ?Medication Sig Dispense Refill  ? esomeprazole (NEXIUM) 20 MG capsule Take 20 mg by mouth as needed.      ? furosemide (LASIX) 20 MG tablet Take 20 mg by mouth daily as needed for fluid.       ? levothyroxine (SYNTHROID) 112 MCG tablet Take 1 tablet (112 mcg total) by mouth every morning. 30 tablet 0  ? metoprolol succinate (TOPROL-XL) 50 MG 24 hr tablet Take 1 tablet (50 mg total) by mouth daily. Take with or immediately following a meal. 30 tablet 1  ? sertraline (ZOLOFT) 50 MG tablet Take 50 mg by mouth  daily.      ? traZODone (DESYREL) 50 MG tablet Take 50 mg by mouth at bedtime as needed for sleep.       ?  ?No current facility-administered medications for this visit.  ?  ?  ?Allergies:   Sulfa antibiotics  ?  ?Social History:  The patient  reports that she has quit smoking. Her smoking use included cigarettes. She has never used smokeless tobacco. She reports current alcohol use. She reports that she does not use drugs.  ?  ?Family History:  The patient's  family history is not on file.  ?  ?  ?ROS:  Please see the history of present illness.   All other systems  are reviewed and negative.  ?  ?  ?PHYSICAL EXAM: ?VS:  BP 122/72   Pulse 81   Ht '5\' 2"'$  (1.575 m)   Wt 168 lb 9.6 oz (76.5 kg)   SpO2 96%   BMI 30.84 kg/m?  , BMI Body mass index is 30.84 kg/m?. ?  ?  ?GEN: Well nourished, well developed, in no acute distress.  Appears younger than stated age ?HEENT: normal  ?Neck: no JVD, carotid bruits, or masses ?Cardiac: Irregularly irregular; no murmurs, rubs, or gallops,no edema  ?Respiratory:  clear to auscultation bilaterally, normal work of breathing ?GI: soft, nontender, nondistended, + BS ?MS: no deformity or atrophy  ?Skin: warm and dry  ?Neuro:  Strength and sensation are intact ?Psych: euthymic mood, full affect ?  ?EKG:  EKG is ordered today. ?The ekg ordered today shows atrial fibrillation ?  ?  ?Recent Labs: ?10/12/2019: B Natriuretic Peptide 344.5 ?10/13/2019: ALT 13 ?10/16/2019: Magnesium 1.7 ?01/27/2020: BUN 16; Creatinine, Ser 0.93; Hemoglobin 13.4; Platelets 213; Potassium 5.0; Sodium 143; TSH 2.000  ?  ?  ?Lipid Panel  ?Labs (Brief)  ?No results found for: CHOL, TRIG, HDL, CHOLHDL, VLDL, LDLCALC, LDLDIRECT  ? ?  ?  ?   ?Wt Readings from Last 3 Encounters:  ?02/16/20 168 lb 9.6 oz (76.5 kg)  ?01/27/20 165 lb 9.6 oz (75.1 kg)  ?10/28/19 169 lb (76.7 kg)  ?  ?  ?  ?Other studies Reviewed: ?Prior hospitalization records ?  ?October 13, 2019 echo personally reviewed ?Left ventricular function mildly decreased, 45% ?Right ventricular function normal ?Severe left and right atrial dilation ?  ?ASSESSMENT AND PLAN: ?  ?1.  Permanent atrial fibrillation ?I have seen Katrina Randolph in the office today who is being considered for a Watchman left atrial appendage closure device. I believe they will benefit from this procedure given their history of atrial fibrillation, CHA2DS2-VASc score of 3 and unadjusted ischemic stroke rate of 3.2% per year. Unfortunately, the patient is not felt to be a long term anticoagulation candidate secondary to recurrent severe anemia  requiring transfusions. The patient's chart has been reviewed and I feel that they would be a candidate for short term oral anticoagulation after Watchman implant.  ?  ?It is my belief that after undergoing a LAA closure procedure, Katrina Randolph will not need long term anticoagulation which eliminates anticoagulation side effects and major bleeding risk.  ?  ?Procedural risks for the Watchman implant have been reviewed with the patient including a 0.5% risk of stroke, <1% risk of perforation and <1% risk of device embolization.  ?  ?Given the etiology of her anemia is thought to be GI related, my postprocedural anticoagulation strategy would be half dose Eliquis.  I would avoid the aspirin given her history of GI  bleeds. ?  ?The published clinical data on the safety and effectiveness of WATCHMAN include but are not limited to the following: ?- Holmes DR, Mechele Claude, Sick P et al. for the PROTECT AF Investigators. Percutaneous closure of the left atrial appendage versus warfarin therapy for prevention of stroke in patients with atrial fibrillation: a randomised non-inferiority trial. Lancet 2009; 374: 534-42. ?- Mechele Claude, Doshi SK, Abelardo Diesel D et al. on behalf of the PROTECT AF Investigators. Percutaneous Left Atrial Appendage Closure for Stroke Prophylaxis in Patients With Atrial Fibrillation 2.3-Year Follow-up of the PROTECT AF (Watchman Left Atrial Appendage System for Embolic Protection in Patients With Atrial Fibrillation) Trial. Circulation 2013; 127:720-729. ?- Alli O, Doshi S,  Kar S, Reddy VY, Sievert H et al. Quality of Life Assessment in the Randomized PROTECT AF (Percutaneous Closure of the Left Atrial Appendage Versus Warfarin Therapy for Prevention of Stroke in Patients With Atrial Fibrillation) Trial of Patients at Risk for Stroke With Nonvalvular Atrial Fibrillation. J Am Coll Cardiol 2013; 61:1790-8. ?- Holmes DR, Tarri Abernethy, Price M, Whisenant B, Sievert H, Doshi S, Huber K, Reddy V.  Prospective randomized evaluation of the Watchman left atrial appendage Device in patients with atrial fibrillation versus long-term warfarin therapy; the PREVAIL trial. Journal of the [employer]

## 2021-05-24 NOTE — Progress Notes (Signed)
?  [facility]   ? ?Patient doing well s/p Watchman implant. She is hemodynamically stable. Groin site is stable. Plan for early ambulation after bedrest completed and hopeful discharge over the next 24 hours.  ? ? ?Kathyrn Drown NP-C ?Structural Heart Team  ?Pager: [phone] ?Phone: [phone] ? ?

## 2021-05-24 NOTE — Anesthesia Postprocedure Evaluation (Signed)
Anesthesia Post Note ? ?Patient: Katrina Randolph ? ?Procedure(s) Performed: LEFT ATRIAL APPENDAGE OCCLUSION ?TRANSESOPHAGEAL ECHOCARDIOGRAM (TEE) ? ?  ? ?Patient location during evaluation: PACU ?Anesthesia Type: General ?Level of consciousness: sedated and patient cooperative ?Pain management: pain level controlled ?Vital Signs Assessment: post-procedure vital signs reviewed and stable ?Respiratory status: spontaneous breathing ?Cardiovascular status: stable ?Anesthetic complications: no ? ? ?There were no known notable events for this encounter. ? ?Last Vitals:  ?Vitals:  ? 05/24/21 1415 05/24/21 1449  ?BP: (!) 155/76   ?Pulse: 70   ?Resp: 16   ?Temp:  36.5 ?C  ?SpO2: 92%   ?  ?Last Pain:  ?Vitals:  ? 05/24/21 1449  ?TempSrc: Oral  ?PainSc:   ? ? ?  ?  ?  ?  ?  ?  ? ?Nolon Nations ? ? ? ? ?

## 2021-05-24 NOTE — TOC Progression Note (Signed)
Transition of Care (TOC) - Progression Note  ? ? ?Patient Details  ?Name: JOICE NAZARIO ?MRN: [id number] ?Date of Birth: February 22, 1930 ? ?Transition of Care (TOC) CM/SW Contact  ?Angelita Ingles, RN ?Phone Number:[phone] ? ?05/24/2021, 2:37 PM ? ?Clinical Narrative:    ? ?Transition of Care (TOC) Screening Note ? ? ?Patient Details  ?Name: NADYA HOPWOOD ?Date of Birth: [birth year] ? ? ?Transition of Care (TOC) CM/SW Contact:    ?Angelita Ingles, RN ?Phone Number: ?05/24/2021, 2:37 PM ? ? ? ?Transition of Care Department [facility]) has reviewed patient and no TOC needs have been identified at this time. We will continue to monitor patient advancement through interdisciplinary progression rounds.  ? ? ? ? ?  ?  ? ?Expected Discharge Plan and Services ?  ?  ?  ?  ?  ?                ?  ?  ?  ?  ?  ?  ?  ?  ?  ?  ? ? ?Social Determinants of Health (SDOH) Interventions ?  ? ?Readmission Risk Interventions ?   ? View : No data to display.  ?  ?  ?  ? ? ?

## 2021-05-24 NOTE — Discharge Summary (Signed)
?  ? ?[facility]   ? ?Patient ID: Katrina Randolph,  ?MRN: [id number], DOB/AGE: [birth year] 88 y.o. ? ?Admit date: 05/24/2021 ?Discharge date: 05/25/2021 ? ?Primary Care Physician: [name], PA-C  ?Primary Cardiologist: [name], MD  ?Electrophysiologist: [name], MD ? ?Primary Discharge Diagnosis:  ?Persistent Atrial Fibrillation ?Poor candidacy for long term anticoagulation due to  anemia requiring transfusions ? ?Secondary Discharge Diagnosis:  ?Ischemic CVA ?HTN ? ?Procedures This Admission:  ?CONCLUSIONS:  ?1.Successful implantation of a WATCHMAN left atrial appendage occlusive device    ?2. TEE demonstrating no LAA thrombus ?3. No early apparent complications.  ?  ?Post Implant Anticoagulation Strategy: ?Continue Pradaxa 150 mg by mouth twice daily for 45 days post implant.  If 45-day transesophageal echo criteria are met, stop Pradaxa and start Plavix 75 mg by mouth once daily to complete 6 months of total post implant therapy. ? ?Brief HPI: ?Katrina Randolph is a 88 y.o. female with a history of chronic atrial fibrillation not on anticoagulation due to anemia requiring multiple transfusions with no clear source of her bleeding by EGD.  ?  ?She was admitted to the hospital 10/12/2019-10/20/2019 with atrial fibrillation with RVR found to have PNA, anemia, acute thrombocytopenia, and orthostatic dizziness. She had presented to her PCP and was found to have a tachycardic rhythm with a heart rate greater than 160 bpm. She was referred to the ED for further evaluation. Her hemoglobin had trended down to 7.7 with reportedly dark colored stools. On GI evaluation with colonoscopy and EGD, there was no clear source identified although she has a hiatal hernia and Cameron's erosions with small polyps and diverticulosis.  ?  ?On follow up with [name], she was ultimately referred to [name] for consideration of Watchman implant however she deferred at the time of her  evaluation. Unfortunately she was admitted thereafter with an ischemic stroke. [name] was contacted regarding anticoagulation however given acute CVA, this was deferred to neurology. ?  ?In follow up with [name] 04/20/21 she reportedly took herself off the Pradaxa out of fear of anticoagulation therapies. Watchman procedure was revisited and plan was to reschedule to discuss proceeding with implant. She was restarted in her beta blocker (which she had been out of for several weeks) and it was reccommended that she restart Pradaxa as she seemed to be tolerating this well.  ? ?In follow with myself she reported interest in pursuing Watchman at this time. On initial evaluation with [name], the daughter reported that she recorded the conversation on her phone, which they have both re-listened to earlier this week which answered many of their pre procedure questions. She reports full compliance with Pradaxa at this time. The risks, benefits, and alternatives to left atrial appendage occlusive device placement device were reviewed with the patient and the patient wished to proceed. Given this, the patient was felt to have adequate anatomy for LAAO closure device which was scheduled on 05/24/21.  ? ?Hospital Course:  ?The patient was admitted and underwent left atrial appendage occlusive device placement with Watchman FLX 55m.  She was monitored on telemetry overnight which demonstrated AF with stable rates. Groin site was without complication on the day of discharge. The patient was examined and considered to be stable for discharge.  Wound care and restrictions were reviewed with the patient. The patient has been scheduled for post procedure follow up with JKathyrn Drown NP in 1 month.  A repeat TEE at approximately 45 days  will be performed to ensure proper seal of the device.  ? ?Medication plan: Continue Pradaxa 150 mg by mouth twice daily for 45 days post implant.  If 45-day transesophageal echo  criteria are met, stop Pradaxa and start Plavix 75 mg by mouth once daily to complete 6 months of total post implant therapy. ? ?Physical Exam: ?Vitals:  ? 05/24/21 1919 05/24/21 2302 05/25/21 0439 05/25/21 0730  ?BP: 135/63 131/72 (!) 123/52 102/87  ?Pulse: 78 70 69 77  ?Resp: '19 16 17 20  ' ?Temp: 97.6 ?F (36.4 ?C) 98.3 ?F (36.8 ?C) 98.3 ?F (36.8 ?C) 98.3 ?F (36.8 ?C)  ?TempSrc: Oral Oral Oral Oral  ?SpO2: 95% 95% 95% 95%  ?Weight:      ?Height:      ? ?General: Well developed, well nourished, NAD ?Lungs:Clear to ausculation bilaterally. No wheezes, rales, or rhonchi. Breathing is unlabored. ?Cardiovascular: Irregular. No murmurs ?Extremities: No edema.  ?Neuro: Alert and oriented. No focal deficits. No facial asymmetry. MAE spontaneously. ?Psych: Responds to questions appropriately with normal affect.   ? ?Labs: ?  ?Lab Results  ?Component Value Date  ? WBC 7.0 04/30/2021  ? HGB 11.6 04/30/2021  ? HCT 36.3 04/30/2021  ? MCV 87 04/30/2021  ? PLT 200 04/30/2021  ?  ?Recent Labs  ?Lab 05/25/21 ?6568  ?NA 138  ?K 4.7  ?CL 108  ?CO2 23  ?BUN 18  ?CREATININE 0.89  ?CALCIUM 8.3*  ?GLUCOSE 125*  ? ?Discharge Medications:  ?Allergies as of 05/25/2021   ? ?   Reactions  ? Sulfa Antibiotics   ? "feels bad"  ? ?  ? ?  ?Medication List  ?  ? ?TAKE these medications   ? ?acetaminophen 325 MG tablet ?Commonly known as: TYLENOL ?Take 2 tablets (650 mg total) by mouth every 4 (four) hours as needed for mild pain (or temp > 37.5 C (99.5 F)). ?  ?clotrimazole-betamethasone cream ?Commonly known as: LOTRISONE ?Apply 1 application. topically 2 (two) times daily as needed (rash). ?  ?esomeprazole 20 MG capsule ?Commonly known as: [place] ?Take 1 capsule (20 mg total) by mouth daily. ?  ?furosemide 20 MG tablet ?Commonly known as: LASIX ?Take 20 mg by mouth daily as needed for fluid. ?  ?gabapentin 100 MG capsule ?Commonly known as: NEURONTIN ?Take 100 mg by mouth 2 (two) times daily. ?  ?levothyroxine 112 MCG tablet ?Commonly known as:  SYNTHROID ?Take 1 tablet (112 mcg total) by mouth every morning. ?  ?metoprolol succinate 50 MG 24 hr tablet ?Commonly known as: TOPROL-XL ?Take 1 tablet (50 mg total) by mouth daily. Take with or immediately following a meal. ?  ?Pradaxa 150 MG Caps capsule ?Generic drug: dabigatran ?TAKE 1 CAPSULE BY MOUTH TWICE A DAY ?  ?sertraline 50 MG tablet ?Commonly known as: ZOLOFT ?Take 1 tablet (50 mg total) by mouth daily. ?  ? ?  ? ?Disposition: Home  ?Discharge Instructions   ? ? Call MD for:  difficulty breathing, headache or visual disturbances   Complete by: As directed ?  ? Call MD for:  extreme fatigue   Complete by: As directed ?  ? Call MD for:  hives   Complete by: As directed ?  ? Call MD for:  persistant dizziness or light-headedness   Complete by: As directed ?  ? Call MD for:  persistant nausea and vomiting   Complete by: As directed ?  ? Call MD for:  redness, tenderness, or signs of infection (pain, swelling, redness, odor  or green/yellow discharge around incision site)   Complete by: As directed ?  ? Call MD for:  severe uncontrolled pain   Complete by: As directed ?  ? Call MD for:  temperature >100.4   Complete by: As directed ?  ? Diet - low sodium heart healthy   Complete by: As directed ?  ? Discharge instructions   Complete by: As directed ?  ? WATCHMAN? Procedure, Care After ? ?Procedure MD: [name] ?Watchman Clinical Coordinator: Lenice Llamas, RN ? ?This sheet gives you information about how to care for yourself after your procedure. Your health care provider may also give you more specific instructions. If you have problems or questions, contact your health care provider. ? ?What can I expect after the procedure? ?After the procedure, it is common to have: ?Bruising around your puncture site. ?Tenderness around your puncture site. ?Tiredness (fatigue). ? ?Medication instructions ?It is very important to continue to take your blood thinner as directed by your doctor after the Watchman procedure.  Call your procedure doctor's office with question or concerns. ?If you are on Coumadin (warfarin), you will have your INR checked the week after your procedure, with a goal INR of 2.0 - 3.0. ?Please follow your medica

## 2021-05-24 NOTE — Progress Notes (Signed)
?  Echocardiogram ?Echocardiogram Transesophageal has been performed. ? Hassie Bruce ?05/24/2021, 1:33 PM ?

## 2021-05-25 ENCOUNTER — Encounter (HOSPITAL_COMMUNITY): Payer: Self-pay | Admitting: Cardiology

## 2021-05-25 DIAGNOSIS — Z006 Encounter for examination for normal comparison and control in clinical research program: Secondary | ICD-10-CM | POA: Diagnosis not present

## 2021-05-25 DIAGNOSIS — I4821 Permanent atrial fibrillation: Secondary | ICD-10-CM | POA: Diagnosis not present

## 2021-05-25 DIAGNOSIS — I10 Essential (primary) hypertension: Secondary | ICD-10-CM | POA: Diagnosis not present

## 2021-05-25 DIAGNOSIS — Z8673 Personal history of transient ischemic attack (TIA), and cerebral infarction without residual deficits: Secondary | ICD-10-CM | POA: Diagnosis not present

## 2021-05-25 LAB — BASIC METABOLIC PANEL
Anion gap: 7 (ref 5–15)
BUN: 18 mg/dL (ref 8–23)
CO2: 23 mmol/L (ref 22–32)
Calcium: 8.3 mg/dL — ABNORMAL LOW (ref 8.9–10.3)
Chloride: 108 mmol/L (ref 98–111)
Creatinine, Ser: 0.89 mg/dL (ref 0.44–1.00)
GFR, Estimated: >60 mL/min (ref >60)
Glucose, Bld: 125 mg/dL — ABNORMAL HIGH (ref 70–99)
Potassium: 4.7 mmol/L (ref 3.5–5.1)
Sodium: 138 mmol/L (ref 135–145)

## 2021-05-25 NOTE — Discharge Instructions (Addendum)

## 2021-05-25 NOTE — Plan of Care (Signed)

## 2021-05-25 NOTE — Plan of Care (Signed)

## 2021-05-25 NOTE — Progress Notes (Signed)
Discharge instructions reviewed with patient, written copy highlighted for patient. Reminded to take to MD visits. Patient via wheelchair to daughter's waiting car in stable condition. ?

## 2021-05-28 ENCOUNTER — Telehealth: Payer: Self-pay

## 2021-05-28 NOTE — Telephone Encounter (Signed)
Left message to call back  

## 2021-05-29 NOTE — Telephone Encounter (Signed)
?  [facility]   ?Watchman Team ? ?Contacted the patient regarding discharge from [facility] on 05/25/2021  ? ?The patient understands discharge instructions? Yes ? ?The patient understands medications and regimen? Yes  ? ?The patient reports groin sites look healthy with no S/S of bleeding or infection ? ?The patient is currently scheduled for follow-up and 45-day TEE in May. She reports general weakness that has worsened since LAAO and discomfort at her groin site and her occasionally in her chest. While she denies actual pain, she states she "just doesn't feel right" since the procedure. ?Scheduled the patient for evaluation this Friday with Nell Range. She will call prior to that visit if symptoms do not subside or worsen. ?ER precautions reviewed. ?She was grateful for call and agrees with plan. ? ?

## 2021-05-31 NOTE — Addendum Note (Signed)
Encounter addended by: Cheri Rous, RT on: 05/31/2021 1:09 PM ? Actions taken: Imaging Exam begun

## 2021-05-31 NOTE — Addendum Note (Signed)
Encounter addended by: Cheri Rous, RT on: 05/31/2021 1:11 PM ? Actions taken: Imaging Exam ended

## 2021-06-01 ENCOUNTER — Ambulatory Visit
Admission: RE | Admit: 2021-06-01 | Discharge: 2021-06-01 | Disposition: A | Discharge status: Home / Self Care | Payer: Medicare Other | Source: Ambulatory Visit | Attending: Physician Assistant | Admitting: Physician Assistant

## 2021-06-01 ENCOUNTER — Ambulatory Visit: Payer: Medicare Other | Admitting: Physician Assistant

## 2021-06-01 VITALS — BP 102/68 | HR 91 | Ht 62.0 in | Wt 167.8 lb

## 2021-06-01 DIAGNOSIS — I5033 Acute on chronic diastolic (congestive) heart failure: Secondary | ICD-10-CM | POA: Diagnosis not present

## 2021-06-01 DIAGNOSIS — I482 Chronic atrial fibrillation, unspecified: Secondary | ICD-10-CM

## 2021-06-01 DIAGNOSIS — R0602 Shortness of breath: Secondary | ICD-10-CM

## 2021-06-01 DIAGNOSIS — Z95818 Presence of other cardiac implants and grafts: Secondary | ICD-10-CM

## 2021-06-01 MED ORDER — FUROSEMIDE 40 MG PO TABS: 40.0000 mg | ORAL_TABLET | Freq: Every day | ORAL | 3 refills | Status: DC

## 2021-06-01 NOTE — Patient Instructions (Signed)
Medication Instructions:  ?Starting today take Lasix 40 mg daily  ? ?*If you need a refill on your cardiac medications before your next appointment, please call your pharmacy* ? ? ?Lab Work: ?Bmp, Cbc, Pro Bnp- Today  ? ?If you have labs (blood work) drawn today and your tests are completely normal, you will receive your results only by: ?MyChart Message (if you have MyChart) OR ?A paper copy in the mail ?If you have any lab test that is abnormal or we need to change your treatment, we will call you to review the results. ? ? ?Testing/Procedures: ?Chest X-ray Instructions: ?   1. You may have this done at the [facility], located in the [place] on the 1st floor. ?   2. You do no have to have an appointment. ?   3. [facility] [place] ?       [place], [place] [address] ?       [phone] ?       Monday - Friday  8:00 am - 5:00 pm ? ? ? ?Follow-Up: ?Follow up as scheduled  ? ?Other Instructions ? ? ?Important Information About Sugar ? ? ? ? ?  ?

## 2021-06-01 NOTE — Progress Notes (Signed)
?[facility]   ?[name] ?                                    ?Cardiology Office Note:   ? ?Date:  06/02/2021  ? ?ID:  Katrina Randolph, DOB January 28, 1931, MRN [id number] ? ?PCP:  [name], PA-C  ?[facility] Cardiologist:  [name], MD  ?[facility] Electrophysiologist:  [name], MD  ? ?Referring MD: [name], PA-C  ? ?CC "Not feeling right after Watchman." ? ?History of Present Illness:   ? ?Katrina Randolph is a 88 y.o. female with a hx of dementia, HTN, ischemic CVA, chronic atrial fibrillation, recurrent GI bleeding s/p LAAO with Watchman on 05/24/21 who presents to clinic for evaluation of "not feeling right." ? ?She was admitted to the hospital 10/12/2019-10/20/2019 with atrial fibrillation with RVR found to have PNA, anemia, acute thrombocytopenia, and orthostatic dizziness. Her hemoglobin was 7.7 with reportedly dark colored stools. On GI evaluation with colonoscopy and EGD, there was no clear source identified although she has a hiatal hernia and Cameron's erosions with small polyps and diverticulosis.  ?  ?On follow up with [name], she was ultimately referred to [name] for consideration of Watchman implant however she deferred at the time of her evaluation. Unfortunately she was admitted later with an ischemic stroke. She was later started on Pradaxa.  ?  ?In follow up with [name] 04/20/21 she reportedly took herself off the Pradaxa out of fear of anticoagulation therapies. She was referred for Watchman.  ? ?She underwent successful LAAO with a Watchman FLX 73m by Dr. [name] 05/24/21. She was continued on Pradaxa 150 mg BID with plans to transition of Plavix monotherapy if 45 day TEE requirements were met to complete 6 months of total post implant therapy and then transition to aspirin alone.  ? ?When called for follow up, she complained of general weakness and shortness of breath and was added onto my schedule  for evaluation.  ? ?Today she is seen alone. She is not doing well. She moslty complains of shortness of breath. She denies chest pain. Groin felt tight but now better. Just has no energy. Has worsening LE edema. Taking PRN Lasix 248malmost everyday. This does not increase UOP. Mild abdominal swelling. No fever or chills. No orthopnea or PND.  ? ?Past Medical History:  ?Diagnosis Date  ? Atrial fibrillation (HCCascade  ? Presence of Watchman left atrial appendage closure device 05/24/2021  ? Watchman FLX [id number] Dr. LamQuentin Ore ? ?Past Surgical History:  ?Procedure Laterality Date  ? ABDOMINAL HYSTERECTOMY    ? BIOPSY  10/19/2019  ? Procedure: BIOPSY;  Surgeon: [name];  Location: [facility];  Service: Gastroenterology;;  ? BREAST LUMPECTOMY    ? CHOLECYSTECTOMY    ? COLONOSCOPY WITH PROPOFOL N/A 10/19/2019  ? Procedure: COLONOSCOPY WITH PROPOFOL;  Surgeon: [name];  Location: [facility];  Service: Gastroenterology;  Laterality: N/A;  ? ESOPHAGOGASTRODUODENOSCOPY (EGD) WITH PROPOFOL N/A 10/19/2019  ? Procedure: ESOPHAGOGASTRODUODENOSCOPY (EGD) WITH PROPOFOL;  Surgeon: [name];  Location: [facility];  Service: Gastroenterology;  Laterality: N/A;  ? LEFT ATRIAL APPENDAGE OCCLUSION N/A 05/24/2021  ? Procedure: LEFT ATRIAL APPENDAGE OCCLUSION;  Surgeon: LamVickie EpleyD;  Location: [facility];  Service: Cardiovascular;  Laterality: N/A;  ? POLYPECTOMY  10/19/2019  ? Procedure: POLYPECTOMY;  Surgeon: [name], MD;  Location: [facility];  Service: Gastroenterology;;  ? TEE WITHOUT CARDIOVERSION N/A 05/24/2021  ? Procedure: TRANSESOPHAGEAL ECHOCARDIOGRAM (TEE);  Surgeon: [name], MD;  Location: [facility];  Service: Cardiovascular;  Laterality: N/A;  ? ? ?Current Medications: ?Current Meds  ?Medication Sig  ? acetaminophen (TYLENOL) 325 MG tablet Take 2 tablets (650 mg total) by mouth every 4 (four) hours as needed for mild pain (or temp > 37.5  C (99.5 F)).  ? clotrimazole-betamethasone (LOTRISONE) cream Apply 1 application. topically 2 (two) times daily as needed (rash).  ? esomeprazole (NEXIUM) 20 MG capsule Take 1 capsule (20 mg total) by mouth daily.  ? furosemide (LASIX) 40 MG tablet Take 1 tablet (40 mg total) by mouth daily.  ? gabapentin (NEURONTIN) 100 MG capsule Take 100 mg by mouth 2 (two) times daily.  ? levothyroxine (SYNTHROID) 112 MCG tablet Take 1 tablet (112 mcg total) by mouth every morning.  ? metoprolol succinate (TOPROL-XL) 50 MG 24 hr tablet Take 1 tablet (50 mg total) by mouth daily. Take with or immediately following a meal.  ? PRADAXA 150 MG CAPS capsule TAKE 1 CAPSULE BY MOUTH TWICE A DAY  ? sertraline (ZOLOFT) 50 MG tablet Take 1 tablet (50 mg total) by mouth daily.  ? [DISCONTINUED] furosemide (LASIX) 20 MG tablet Take 20 mg by mouth daily as needed for fluid.   ?  ? ?Allergies:   Sulfa antibiotics  ? ?Social History  ? ?Socioeconomic History  ? Marital status: Widowed  ?  Spouse name: Not on file  ? Number of children: Not on file  ? Years of education: Not on file  ? Highest education level: Not on file  ?Occupational History  ? Not on file  ?Tobacco Use  ? Smoking status: Former  ?  Types: Cigarettes  ?  Quit date: 08/31/1980  ?  Years since quitting: 40.7  ? Smokeless tobacco: Never  ? Tobacco comments:  ?  quit over 20 Years ago  ?Vaping Use  ? Vaping Use: Never used  ?Substance and Sexual Activity  ? Alcohol use: Yes  ?  Comment: occasionally  ? Drug use: Never  ? Sexual activity: Not Currently  ?Other Topics Concern  ? Not on file  ?Social History Narrative  ? Lives alone  ? Right Handed   ? Drinks 2 cups caffeine daily  ? ?Social Determinants of Health  ? ?Financial Resource Strain: Not on file  ?Food Insecurity: Not on file  ?Transportation Needs: Not on file  ?Physical Activity: Not on file  ?Stress: Not on file  ?Social Connections: Not on file  ?  ? ?Family History: ?The patient's family history is not on  file. ? ?ROS:   ?Please see the history of present illness.    ?All other systems reviewed and are negative. ? ?EKGs/Labs/Other Studies Reviewed:   ? ?The following studies were reviewed today: ? ?05/24/21 LAAO procedure ?CONCLUSIONS:  ?1.Successful implantation of a WATCHMAN left atrial appendage occlusive device    ?2. TEE demonstrating no LAA thrombus ?3. No early apparent complications.  ?  ?Post Implant Anticoagulation Strategy: ?Continue Pradaxa 150 mg by mouth twice daily for 45 days post implant.  If 45-day transesophageal echo criteria are met, stop Pradaxa and start Plavix 75 mg by mouth once daily to complete 6 months of total post implant therapy. ? ?EKG:  EKG is ordered today.  The ekg ordered today demonstrates afib with HR 91 ? ?Recent Labs: ?  08/31/2020: ALT 12 ?09/02/2020: Magnesium 2.0 ?06/01/2021: BUN 15; Creatinine, Ser 0.90; Hemoglobin 10.7; NT-Pro BNP 4,150; Platelets 182; Potassium 4.3; Sodium 144  ?Recent Lipid Panel ?   ?Component Value Date/Time  ? CHOL 134 09/01/2020 0434  ? TRIG 65 09/01/2020 0434  ? HDL 48 09/01/2020 0434  ? CHOLHDL 2.8 09/01/2020 0434  ? VLDL 13 09/01/2020 0434  ? [place] 73 09/01/2020 0434  ? ? ? ?Risk Assessment/Calculations:   ? ?CHA2DS2-VASc Score = 6  ? This indicates a 9.7% annual risk of stroke. ?The patient's score is based upon: ?CHF History: 0 ?HTN History: 1 ?Diabetes History: 0 ?Stroke History: 2 ?Vascular Disease History: 0 ?Age Score: 2 ?Gender Score: 1 ?  ? ? ? ?Physical Exam:   ? ?VS:  BP 102/68   Pulse 91   Ht '5\' 2"'  (1.575 m)   Wt 167 lb 12.8 oz (76.1 kg)   SpO2 96%   BMI 30.69 kg/m?    ? ?Wt Readings from Last 3 Encounters:  ?06/01/21 167 lb 12.8 oz (76.1 kg)  ?05/24/21 165 lb (74.8 kg)  ?04/30/21 165 lb (74.8 kg)  ?  ? ?GEN:  Well nourished, well developed in no acute distress ?HEENT: Normal ?NECK: + JVD ?LYMPHATICS: No lymphadenopathy ?CARDIAC: irreg irreg. no murmurs, rubs, gallops ?RESPIRATORY: left lung crackles.  ?ABDOMEN: mildly  distended ?MUSCULOSKELETAL:  Mild LE edema bilaterally; No deformity  ?SKIN: Warm and dry ?NEUROLOGIC:  Alert and oriented x 3 ?PSYCHIATRIC:  Normal affect  ? ?ASSESSMENT:   ? ?1. Shortness of breath   ?2. Acute on chronic diastolic

## 2021-06-02 LAB — BASIC METABOLIC PANEL
BUN/Creatinine Ratio: 17 (ref 12–28)
BUN: 15 mg/dL (ref 8–27)
CO2: 19 mmol/L — ABNORMAL LOW (ref 20–29)
Calcium: 8.5 mg/dL — ABNORMAL LOW (ref 8.7–10.3)
Chloride: 108 mmol/L — ABNORMAL HIGH (ref 96–106)
Creatinine, Ser: 0.9 mg/dL (ref 0.57–1.00)
Glucose: 93 mg/dL (ref 70–99)
Potassium: 4.3 mmol/L (ref 3.5–5.2)
Sodium: 144 mmol/L (ref 134–144)
eGFR: 61 mL/min/{1.73_m2} (ref >59)

## 2021-06-02 LAB — CBC
Hematocrit: 33.9 % — ABNORMAL LOW (ref 34.0–46.6)
Hemoglobin: 10.7 g/dL — ABNORMAL LOW (ref 11.1–15.9)
MCH: 27.7 pg (ref 26.6–33.0)
MCHC: 31.6 g/dL (ref 31.5–35.7)
MCV: 88 fL (ref 79–97)
Platelets: 182 10*3/uL (ref 150–450)
RBC: 3.86 x10E6/uL (ref 3.77–5.28)
RDW: 14.2 % (ref 11.7–15.4)
WBC: 7.6 10*3/uL (ref 3.4–10.8)

## 2021-06-02 LAB — PRO B NATRIURETIC PEPTIDE: NT-Pro BNP: 4150 pg/mL — ABNORMAL HIGH (ref 0–738)

## 2021-06-07 NOTE — Progress Notes (Signed)
?[facility]   ?[place] ?                                    ?Cardiology Office Note:   ? ?Date:  06/08/2021  ? ?ID:  [name], DOB 1930-09-04, MRN [id number] ? ?PCP:  [name], PA-C  ?[facility] Cardiologist:  [name], MD  ?[facility] Electrophysiologist:  [name], MD  ? ?Referring MD: [name], PA-C  ? ?CC: CHF follow up  ? ?History of Present Illness:   ? ?Katrina Randolph is a 88 y.o. female with a hx of dementia, HTN, ischemic CVA, chronic atrial fibrillation, recurrent GI bleeding s/p LAAO with Watchman on 05/24/21 who presents to clinic for follow up.  ? ?She was admitted to the hospital 10/12/2019-10/20/2019 with atrial fibrillation with RVR found to have PNA, anemia, acute thrombocytopenia, and orthostatic dizziness. Her hemoglobin was 7.7 with reportedly dark colored stools. On GI evaluation with colonoscopy and EGD, there was no clear source identified although she has a hiatal hernia and Cameron's erosions with small polyps and diverticulosis.  ?  ?On follow up with [name], she was ultimately referred to [name] for consideration of Watchman implant however she deferred at the time of her evaluation. Unfortunately she was admitted later with an ischemic stroke. She was later started on Pradaxa.  ?  ?In follow up with [name] 04/20/21 she reportedly took herself off the Pradaxa out of fear of anticoagulation therapies. She was referred for Watchman.  ? ?She underwent successful LAAO with a Watchman FLX 18m by Dr. [name] 05/24/21. She was continued on Pradaxa 150 mg BID with plans to transition of Plavix monotherapy if 45 day TEE requirements were met to complete 6 months of total post implant therapy and then transition to aspirin alone.  ? ?When called for follow up, she complained of general weakness and shortness of breath and was added onto my schedule for evaluation on 06/01/21. She had evidence  of volume overload and BNP>4000. Her Lasix was increased to 462mdaily. Creat 0.90, K 4.3, Hg 10.7. CXR with no acute abnormality.  ? ?Today the patient presents to clinic for follow up. She has had a 10 pound weight loss and an improvement in lower extremity edema, abdominal distention and shortness of breath. She still has shortness of breath but back to her baseline.  ? ? ?Past Medical History:  ?Diagnosis Date  ? Atrial fibrillation ([place]  ? Presence of Watchman left atrial appendage closure device 05/24/2021  ? Watchman FLX [id number] Dr. LamQuentin Ore ? ?Past Surgical History:  ?Procedure Laterality Date  ? ABDOMINAL HYSTERECTOMY    ? BIOPSY  10/19/2019  ? Procedure: BIOPSY;  Surgeon: [name];  Location: [facility];  Service: Gastroenterology;;  ? BREAST LUMPECTOMY    ? CHOLECYSTECTOMY    ? COLONOSCOPY WITH PROPOFOL N/A 10/19/2019  ? Procedure: COLONOSCOPY WITH PROPOFOL;  Surgeon: [name];  Location: [facility];  Service: Gastroenterology;  Laterality: N/A;  ? ESOPHAGOGASTRODUODENOSCOPY (EGD) WITH PROPOFOL N/A 10/19/2019  ? Procedure: ESOPHAGOGASTRODUODENOSCOPY (EGD) WITH PROPOFOL;  Surgeon: [name];  Location: [facility];  Service: Gastroenterology;  Laterality: N/A;  ? LEFT ATRIAL APPENDAGE OCCLUSION N/A 05/24/2021  ? Procedure: LEFT ATRIAL APPENDAGE OCCLUSION;  Surgeon: LamVickie EpleyD;  Location: [facility];  Service: Cardiovascular;  Laterality:  N/A;  ? POLYPECTOMY  10/19/2019  ? Procedure: POLYPECTOMY;  Surgeon: [name], MD;  Location: [facility];  Service: Gastroenterology;;  ? TEE WITHOUT CARDIOVERSION N/A 05/24/2021  ? Procedure: TRANSESOPHAGEAL ECHOCARDIOGRAM (TEE);  Surgeon: [name], MD;  Location: [facility] [facility];  Service: Cardiovascular;  Laterality: N/A;  ? ? ?Current Medications: ?Current Meds  ?Medication Sig  ? acetaminophen (TYLENOL) 325 MG tablet Take 2 tablets (650 mg total) by mouth every 4 (four) hours as needed  for mild pain (or temp > 37.5 C (99.5 F)).  ? esomeprazole (NEXIUM) 20 MG capsule Take 1 capsule (20 mg total) by mouth daily.  ? furosemide (LASIX) 40 MG tablet Take 40 mg by mouth as directed. Take 40 mg daily alternating with 20 mg daily  ? gabapentin (NEURONTIN) 100 MG capsule Take 100 mg by mouth 2 (two) times daily.  ? levothyroxine (SYNTHROID) 112 MCG tablet Take 1 tablet (112 mcg total) by mouth every morning.  ? metoprolol succinate (TOPROL-XL) 50 MG 24 hr tablet Take 1 tablet (50 mg total) by mouth daily. Take with or immediately following a meal.  ? PRADAXA 150 MG CAPS capsule TAKE 1 CAPSULE BY MOUTH TWICE A DAY  ? sertraline (ZOLOFT) 50 MG tablet Take 1 tablet (50 mg total) by mouth daily.  ? [DISCONTINUED] furosemide (LASIX) 40 MG tablet Take 1 tablet (40 mg total) by mouth daily.  ?  ? ?Allergies:   Sulfa antibiotics  ? ?Social History  ? ?Socioeconomic History  ? Marital status: Widowed  ?  Spouse name: Not on file  ? Number of children: Not on file  ? Years of education: Not on file  ? Highest education level: Not on file  ?Occupational History  ? Not on file  ?Tobacco Use  ? Smoking status: Former  ?  Types: Cigarettes  ?  Quit date: 08/31/1980  ?  Years since quitting: 40.7  ? Smokeless tobacco: Never  ? Tobacco comments:  ?  quit over 20 Years ago  ?Vaping Use  ? Vaping Use: Never used  ?Substance and Sexual Activity  ? Alcohol use: Yes  ?  Comment: occasionally  ? Drug use: Never  ? Sexual activity: Not Currently  ?Other Topics Concern  ? Not on file  ?Social History Narrative  ? Lives alone  ? Right Handed   ? Drinks 2 cups caffeine daily  ? ?Social Determinants of Health  ? ?Financial Resource Strain: Not on file  ?Food Insecurity: Not on file  ?Transportation Needs: Not on file  ?Physical Activity: Not on file  ?Stress: Not on file  ?Social Connections: Not on file  ?  ? ?Family History: ?The patient's family history is not on file. ? ?ROS:   ?Please see the history of present illness.    ?All  other systems reviewed and are negative. ? ?EKGs/Labs/Other Studies Reviewed:   ? ?The following studies were reviewed today: ? ?05/24/21 LAAO procedure ?CONCLUSIONS:  ?1.Successful implantation of a WATCHMAN left atrial appendage occlusive device    ?2. TEE demonstrating no LAA thrombus ?3. No early apparent complications.  ?  ?Post Implant Anticoagulation Strategy: ?Continue Pradaxa 150 mg by mouth twice daily for 45 days post implant.  If 45-day transesophageal echo criteria are met, stop Pradaxa and start Plavix 75 mg by mouth once daily to complete 6 months of total post implant therapy. ? ?EKG:  EKG is ordered today.  The ekg ordered today demonstrates afib with HR 91 ? ?Recent Labs: ?  08/31/2020: ALT 12 ?09/02/2020: Magnesium 2.0 ?06/01/2021: BUN 15; Creatinine, Ser 0.90; Hemoglobin 10.7; NT-Pro BNP 4,150; Platelets 182; Potassium 4.3; Sodium 144  ?Recent Lipid Panel ?   ?Component Value Date/Time  ? CHOL 134 09/01/2020 0434  ? TRIG 65 09/01/2020 0434  ? HDL 48 09/01/2020 0434  ? CHOLHDL 2.8 09/01/2020 0434  ? VLDL 13 09/01/2020 0434  ? [place] 73 09/01/2020 0434  ? ? ? ?Risk Assessment/Calculations:   ? ?CHA2DS2-VASc Score = 6  ? This indicates a 9.7% annual risk of stroke. ?The patient's score is based upon: ?CHF History: 0 ?HTN History: 1 ?Diabetes History: 0 ?Stroke History: 2 ?Vascular Disease History: 0 ?Age Score: 2 ?Gender Score: 1 ?  ? ? ? ?Physical Exam:   ? ?VS:  BP 120/70   Pulse 69   Ht _0  (1.575 m)   Wt 157 lb 6.4 oz (71.4 kg)   SpO2 96%   BMI 28.79 kg/m?    ? ?Wt Readings from Last 3 Encounters:  ?06/08/21 157 lb 6.4 oz (71.4 kg)  ?06/01/21 167 lb 12.8 oz (76.1 kg)  ?05/24/21 165 lb (74.8 kg)  ?  ? ?GEN:  Well nourished, well developed in no acute distress ?HEENT: Normal ?NECK: no JVD ?LYMPHATICS: No lymphadenopathy ?CARDIAC: irreg irreg. no murmurs, rubs, gallops ?RESPIRATORY: clear ?ABDOMEN: normal  ?MUSCULOSKELETAL: No LE edema.; No deformity  ?SKIN: Warm and dry ?NEUROLOGIC:  Alert and  oriented x 3 ?PSYCHIATRIC:  Normal affect  ? ?ASSESSMENT:   ? ?1. Acute on chronic diastolic heart failure ([place])   ?2. Chronic atrial fibrillation (HCC)   ?3. Presence of Watchman left atrial appendage clo

## 2021-06-08 ENCOUNTER — Ambulatory Visit: Payer: Medicare Other | Admitting: Physician Assistant

## 2021-06-08 VITALS — BP 120/70 | HR 69 | Ht 62.0 in | Wt 157.4 lb

## 2021-06-08 DIAGNOSIS — Z95818 Presence of other cardiac implants and grafts: Secondary | ICD-10-CM

## 2021-06-08 DIAGNOSIS — D649 Anemia, unspecified: Secondary | ICD-10-CM

## 2021-06-08 DIAGNOSIS — I5033 Acute on chronic diastolic (congestive) heart failure: Secondary | ICD-10-CM | POA: Diagnosis not present

## 2021-06-08 DIAGNOSIS — I482 Chronic atrial fibrillation, unspecified: Secondary | ICD-10-CM | POA: Diagnosis not present

## 2021-06-08 NOTE — Patient Instructions (Signed)
Medication Instructions:  ?Decrease Lasix to 40 mg daily alternating with 20 mg daily  ? ?*If you need a refill on your cardiac medications before your next appointment, please call your pharmacy* ? ? ?Lab Work: ?BMP- Today  ? ?If you have labs (blood work) drawn today and your tests are completely normal, you will receive your results only by: ?MyChart Message (if you have MyChart) OR ?A paper copy in the mail ?If you have any lab test that is abnormal or we need to change your treatment, we will call you to review the results. ? ? ?Testing/Procedures: ?None ordered  ? ? ?Follow-Up: ?Follow up as scheduled  ? ? ?Other Instructions ? ?Important Information About Sugar ? ? ? ? ?  ?

## 2021-06-09 LAB — BASIC METABOLIC PANEL
BUN/Creatinine Ratio: 18 (ref 12–28)
BUN: 19 mg/dL (ref 8–27)
CO2: 18 mmol/L — ABNORMAL LOW (ref 20–29)
Calcium: 9.4 mg/dL (ref 8.7–10.3)
Chloride: 101 mmol/L (ref 96–106)
Creatinine, Ser: 1.05 mg/dL — ABNORMAL HIGH (ref 0.57–1.00)
Glucose: 168 mg/dL — ABNORMAL HIGH (ref 70–99)
Potassium: 3.4 mmol/L — ABNORMAL LOW (ref 3.5–5.2)
Sodium: 141 mmol/L (ref 134–144)
eGFR: 51 mL/min/{1.73_m2} — ABNORMAL LOW (ref >59)

## 2021-06-26 NOTE — Progress Notes (Signed)
[facility]                                     Cardiology Office Note:    Date:  07/04/2021   ID:  Katrina Randolph, DOB [birth year], MRN [id number]  PCP:  [name], PA-C  [facility] Cardiologist:  [name], MD  [facility] Electrophysiologist:  [name], MD   Referring MD: [name], PA-C   Chief Complaint  Patient presents with   Follow-up    S/p watchman    History of Present Illness:    Katrina Randolph is a 88 y.o. female with a hx of dementia, HTN, ischemic CVA, chronic atrial fibrillation, recurrent GI bleeding s/p LAAO with Watchman on 05/24/21 who presents to clinic for follow up.    She was admitted to the hospital 10/12/2019-10/20/2019 with atrial fibrillation with RVR found to have PNA, anemia, acute thrombocytopenia, and orthostatic dizziness. Her hemoglobin was 7.7 with reportedly dark colored stools. On GI evaluation with colonoscopy and EGD, there was no clear source identified although she has a hiatal hernia and Cameron's erosions with small polyps and diverticulosis.    On follow up with [name], she was ultimately referred to [name] for consideration of Watchman implant however she deferred at the time of her evaluation. Unfortunately she was admitted later with an ischemic stroke. She was later started on Pradaxa.    In follow up with [name] 04/20/21 she reportedly took herself off the Pradaxa out of fear of anticoagulation therapies. She underwent successful LAAO with a Watchman FLX 33m by Dr. [name] 05/24/21. She was continued on Pradaxa 150 mg BID with plans to transition of Plavix monotherapy if 45 day TEE requirements were met to complete 6 months of total post implant therapy and then transition to aspirin alone.    When called for follow up, she complained of general weakness and shortness of breath and was added onto my schedule for evaluation on 06/01/21. She had evidence of volume overload  and BNP>4000. Her Lasix was increased to 43mdaily. Creat 0.90, K 4.3, Hg 10.7. CXR with no acute abnormality. On follow up, she has had a 10lb weight loss and an improvement in lower extremity edema, abdominal distention and shortness of breath.   Today she is here alone and states that she is doing ok with no SOB or edema but reports some dizziness with standing at times. This is not new and reports this has occurred for years with no real change. She continues to tolerate Pradaxa with no bleeding in stool or urine. Plan for post watchman TEE 07/11/21 with Dr. O'Audie BoxWe reviewed TEE instructions with patient understanding. She denies chest pain, palpitations, LE edema, orthopnea, or syncope.   Past Medical History:  Diagnosis Date   [place]   Presence of Watchman left atrial appendage closure device 05/24/2021   Watchman FLX [id number] Dr. LamQuentin Ore Past Surgical History:  Procedure Laterality Date   ABDOMINAL HYSTERECTOMY     BIOPSY  10/19/2019   Procedure: BIOPSY;  Surgeon: [name];  Location: [facility];  Service: Gastroenterology;;   BREAST LUMPECTOMY     CHOLECYSTECTOMY     COLONOSCOPY WITH PROPOFOL N/A 10/19/2019   Procedure: COLONOSCOPY WITH PROPOFOL;  Surgeon: [name];  Location: [facility];  Service: Gastroenterology;  Laterality: N/A;   ESOPHAGOGASTRODUODENOSCOPY (  EGD) WITH PROPOFOL N/A 10/19/2019   Procedure: ESOPHAGOGASTRODUODENOSCOPY (EGD) WITH PROPOFOL;  Surgeon: [name], MD;  Location: [facility];  Service: Gastroenterology;  Laterality: N/A;   LEFT ATRIAL APPENDAGE OCCLUSION N/A 05/24/2021   Procedure: LEFT ATRIAL APPENDAGE OCCLUSION;  Surgeon: [name], MD;  Location: [place] [facility];  Service: Cardiovascular;  Laterality: N/A;   POLYPECTOMY  10/19/2019   Procedure: POLYPECTOMY;  Surgeon: [name], MD;  Location: [facility];  Service: Gastroenterology;;   TEE WITHOUT CARDIOVERSION N/A 05/24/2021    Procedure: TRANSESOPHAGEAL ECHOCARDIOGRAM (TEE);  Surgeon: [name], MD;  Location: [place] [facility];  Service: Cardiovascular;  Laterality: N/A;    Current Medications: Current Meds  Medication Sig   acetaminophen (TYLENOL) 325 MG tablet Take 2 tablets (650 mg total) by mouth every 4 (four) hours as needed for mild pain (or temp > 37.5 C (99.5 F)).   amoxicillin (AMOXIL) 500 MG tablet Take 4 tablets by mouth 1 hour before dental procedures and cleanings   clotrimazole-betamethasone (LOTRISONE) cream Apply 1 application. topically 2 (two) times daily as needed (rash).   esomeprazole (NEXIUM) 20 MG capsule Take 1 capsule (20 mg total) by mouth daily.   furosemide (LASIX) 40 MG tablet Take 40 mg by mouth as directed. Take 40 mg daily alternating with 20 mg daily   gabapentin (NEURONTIN) 100 MG capsule Take 100 mg by mouth 2 (two) times daily.   levothyroxine (SYNTHROID) 112 MCG tablet Take 1 tablet (112 mcg total) by mouth every morning.   metoprolol succinate (TOPROL XL) 25 MG 24 hr tablet Take 1 tablet (25 mg total) by mouth daily.   PRADAXA 150 MG CAPS capsule TAKE 1 CAPSULE BY MOUTH TWICE A DAY   sertraline (ZOLOFT) 50 MG tablet Take 1 tablet (50 mg total) by mouth daily.   [DISCONTINUED] metoprolol succinate (TOPROL-XL) 50 MG 24 hr tablet Take 1 tablet (50 mg total) by mouth daily. Take with or immediately following a meal.     Allergies:   Patient has no active allergies.   Social History   Socioeconomic History   Marital status: Widowed    Spouse name: Not on file   Number of children: Not on file   Years of education: Not on file   Highest education level: Not on file  Occupational History   Not on file  Tobacco Use   Smoking status: Former    Types: Cigarettes    Quit date: 08/31/1980    Years since quitting: 40.8   Smokeless tobacco: Never   Tobacco comments:    quit over 20 Years ago  Vaping Use   Vaping Use: Never used  Substance and Sexual Activity    Alcohol use: Yes    Comment: occasionally   Drug use: Never   Sexual activity: Not Currently  Other Topics Concern   Not on file  Social History Narrative   Lives alone   Right Handed    Drinks 2 cups caffeine daily   Social Determinants of Health   Financial Resource Strain: Not on file  Food Insecurity: Not on file  Transportation Needs: Not on file  Physical Activity: Not on file  Stress: Not on file  Social Connections: Not on file    Family History: The patient's family history is not on file.  ROS:   Please see the history of present illness.    All other systems reviewed and are negative.  EKGs/Labs/Other Studies Reviewed:    The following studies were  reviewed today:  05/24/21 LAAO procedure CONCLUSIONS:  1.Successful implantation of a WATCHMAN left atrial appendage occlusive device    2. TEE demonstrating no LAA thrombus 3. No early apparent complications.    Post Implant Anticoagulation Strategy: Continue Pradaxa 150 mg by mouth twice daily for 45 days post implant.  If 45-day transesophageal echo criteria are met, stop Pradaxa and start Plavix 75 mg by mouth once daily to complete 6 months of total post implant therapy.   EKG:  EKG is not ordered today.    Recent Labs: 08/31/2020: ALT 12 09/02/2020: Magnesium 2.0 06/01/2021: NT-Pro BNP 4,150 07/04/2021: BUN 19; Creatinine, Ser 0.86; Hemoglobin 11.6; Platelets 181; Potassium 4.1; Sodium 139   Recent Lipid Panel    Component Value Date/Time   CHOL 134 09/01/2020 0434   TRIG 65 09/01/2020 0434   HDL 48 09/01/2020 0434   CHOLHDL 2.8 09/01/2020 0434   VLDL 13 09/01/2020 0434   LDLCALC 73 09/01/2020 0434    Physical Exam:    VS:  BP 112/68   Pulse 71   Ht '5\' 2"'  (1.575 m)   Wt 162 lb (73.5 kg)   SpO2 96%   BMI 29.63 kg/m     Wt Readings from Last 3 Encounters:  07/04/21 162 lb (73.5 kg)  06/08/21 157 lb 6.4 oz (71.4 kg)  06/01/21 167 lb 12.8 oz (76.1 kg)    General: Well developed, well  nourished, NAD Neck: Negative for carotid bruits. No JVD Lungs:Clear to ausculation bilaterally. Breathing is unlabored. Cardiovascular: RRR with S1 S2. No murmurs Extremities: No edema.  Neuro: Alert and oriented. No focal deficits. No facial asymmetry. MAE spontaneously. Psych: Responds to questions appropriately with normal affect.    ASSESSMENT/PLAN:    Chronic atrial fibrillation: s/p LAAO with Watchman FLX 56m. Continue Pradaxa. Plan for TEE 07/11/21 to assess device seal. If no leak or thrombus plan to transition to Plavix monotherapy if 45 day TEE requirements were met to complete 6 months of total post implant therapy and then transition to aspirin alone. Will need SBE with Amoxicillin for 6 month duration (11/23/21).   After careful review of history and examination, the risks and benefits of transesophageal echocardiogram have been explained including risks of esophageal damage, perforation (1:10,000 risk), bleeding, pharyngeal hematoma as well as other potential complications associated with conscious sedation including aspiration, arrhythmia, respiratory failure and death. Alternatives to treatment were discussed, questions were answered. Patient is willing to proceed.    Chronic diastolic CHF: She is up 5lb since 05/2021 however appears euvolemic on exam. at the last visit I increased her Lasix from 20 mg to 40 mg daily. Continue current Lasix regimen.     Hx of GI bleeding with anemia: Last Hb 11.6. No c/o of bleeding in stool or urine.    Medication Adjustments/Labs and Tests Ordered: Current medicines are reviewed at length with the patient today.  Concerns regarding medicines are outlined above.  Orders Placed This Encounter  Procedures   Basic metabolic panel   CBC   Meds ordered this encounter  Medications   amoxicillin (AMOXIL) 500 MG tablet    Sig: Take 4 tablets by mouth 1 hour before dental procedures and cleanings    Dispense:  12 tablet    Refill:  6    metoprolol succinate (TOPROL XL) 25 MG 24 hr tablet    Sig: Take 1 tablet (25 mg total) by mouth daily.    Dispense:  90 tablet    Refill:  3  Patient Instructions  Medication Instructions:  Start Amoxicillin 500 mg, take 4 tablets by mouth 1 hour before dental procedures and cleanings  Decrease Metoprolol to 25 mg daily   *If you need a refill on your cardiac medications before your next appointment, please call your pharmacy*   Lab Work: Bmp, Cbc- Today   If you have labs (blood work) drawn today and your tests are completely normal, you will receive your results only by: [place] (if you have MyChart) OR A paper copy in the mail If you have any lab test that is abnormal or we need to change your treatment, we will call you to review the results.   Testing/Procedures: Your physician has requested that you have a TEE. During a TEE, sound waves are used to create images of your heart. It provides your doctor with information about the size and shape of your heart and how well your heart's chambers and valves are working. In this test, a transducer is attached to the end of a flexible tube that's guided down your throat and into your esophagus (the tube leading from you mouth to your stomach) to get a more detailed image of your heart. You are not awake for the procedure. Please see the instruction sheet given to you today. For further information please visit [email or web address].   Follow-Up: Follow up as scheduled 1}    Other Instructions You are scheduled for a TEE on Wednesday, May 31 with Dr. Eleonore Chiquito.  Please arrive at the [facility] (Main Entrance A) at [facility]: [place], [place] [address] at 11 am.   DIET: Nothing to eat or drink after midnight except a sip of water with medications (see medication instructions below)  FYI: For your safety, and to allow [place] to monitor your vital signs accurately during the surgery/procedure we request  that   if you have artificial nails, gel coating, SNS etc. Please have those removed prior to your surgery/procedure. Not having the nail coverings /polish removed may result in cancellation or delay of your surgery/procedure.   Medication Instructions: Hold Lasix the morning of your procedure   Continue your anticoagulant: Pradaxa You will need to continue your anticoagulant after your procedure until you are told by your provider that it is safe to stop   Labs: If patient is on Coumadin, patient needs pt/INR, CBC, BMET within 3 days (No pt/INR needed for patients taking Xarelto, Eliquis, Pradaxa) For patients receiving anesthesia for TEE and all Cardioversion patients: BMET, CBC within 1 week You must have a responsible person to drive you home and stay in the waiting area during your procedure. Failure to do so could result in cancellation.  Bring your insurance cards.  *Special Note: Every effort is made to have your procedure done on time. Occasionally there are emergencies that occur at the hospital that may cause delays. Please be patient if a delay does occur.       Signed, Kathyrn Drown, NP  07/04/2021 3:01 PM    [facility]

## 2021-06-26 NOTE — H&P (View-Only) (Signed)
[facility]                                     Cardiology Office Note:    Date:  07/04/2021   ID:  Katrina Randolph, DOB [birth year], MRN [id number]  PCP:  [name], PA-C  [facility] Cardiologist:  [name], MD  [facility] Electrophysiologist:  [name], MD   Referring MD: [name], PA-C   Chief Complaint  Patient presents with   Follow-up    S/p watchman    History of Present Illness:    Katrina Randolph is a 88 y.o. female with a hx of dementia, HTN, ischemic CVA, chronic atrial fibrillation, recurrent GI bleeding s/p LAAO with Watchman on 05/24/21 who presents to clinic for follow up.    She was admitted to the hospital 10/12/2019-10/20/2019 with atrial fibrillation with RVR found to have PNA, anemia, acute thrombocytopenia, and orthostatic dizziness. Her hemoglobin was 7.7 with reportedly dark colored stools. On GI evaluation with colonoscopy and EGD, there was no clear source identified although she has a hiatal hernia and Cameron's erosions with small polyps and diverticulosis.    On follow up with [name], she was ultimately referred to [name] for consideration of Watchman implant however she deferred at the time of her evaluation. Unfortunately she was admitted later with an ischemic stroke. She was later started on Pradaxa.    In follow up with [name] 04/20/21 she reportedly took herself off the Pradaxa out of fear of anticoagulation therapies. She underwent successful LAAO with a Watchman FLX 4m by Dr. [name] 05/24/21. She was continued on Pradaxa 150 mg BID with plans to transition of Plavix monotherapy if 45 day TEE requirements were met to complete 6 months of total post implant therapy and then transition to aspirin alone.    When called for follow up, she complained of general weakness and shortness of breath and was added onto my schedule for evaluation on 06/01/21. She had evidence of volume overload  and BNP>4000. Her Lasix was increased to 436mdaily. Creat 0.90, K 4.3, Hg 10.7. CXR with no acute abnormality. On follow up, she has had a 10lb weight loss and an improvement in lower extremity edema, abdominal distention and shortness of breath.   Today she is here alone and states that she is doing ok with no SOB or edema but reports some dizziness with standing at times. This is not new and reports this has occurred for years with no real change. She continues to tolerate Pradaxa with no bleeding in stool or urine. Plan for post watchman TEE 07/11/21 with Dr. O'Audie BoxWe reviewed TEE instructions with patient understanding. She denies chest pain, palpitations, LE edema, orthopnea, or syncope.   Past Medical History:  Diagnosis Date   Atrial fibrillation ([facility]   Presence of Watchman left atrial appendage closure device 05/24/2021   Watchman FLX 2720mith Dr. LamQuentin Ore Past Surgical History:  Procedure Laterality Date   ABDOMINAL HYSTERECTOMY     BIOPSY  10/19/2019   Procedure: BIOPSY;  Surgeon: [name];  Location: [facility];  Service: Gastroenterology;;   BREAST LUMPECTOMY     CHOLECYSTECTOMY     COLONOSCOPY WITH PROPOFOL N/A 10/19/2019   Procedure: COLONOSCOPY WITH PROPOFOL;  Surgeon: [name];  Location: [facility];  Service: Gastroenterology;  Laterality: N/A;   ESOPHAGOGASTRODUODENOSCOPY (  EGD) WITH PROPOFOL N/A 10/19/2019   Procedure: ESOPHAGOGASTRODUODENOSCOPY (EGD) WITH PROPOFOL;  Surgeon: [name], MD;  Location: [facility];  Service: Gastroenterology;  Laterality: N/A;   LEFT ATRIAL APPENDAGE OCCLUSION N/A 05/24/2021   Procedure: LEFT ATRIAL APPENDAGE OCCLUSION;  Surgeon: [name], MD;  Location: [place] [facility];  Service: Cardiovascular;  Laterality: N/A;   POLYPECTOMY  10/19/2019   Procedure: POLYPECTOMY;  Surgeon: [name], MD;  Location: [facility];  Service: Gastroenterology;;   TEE WITHOUT CARDIOVERSION N/A 05/24/2021    Procedure: TRANSESOPHAGEAL ECHOCARDIOGRAM (TEE);  Surgeon: [name], MD;  Location: [place] [facility];  Service: Cardiovascular;  Laterality: N/A;    Current Medications: Current Meds  Medication Sig   acetaminophen (TYLENOL) 325 MG tablet Take 2 tablets (650 mg total) by mouth every 4 (four) hours as needed for mild pain (or temp > 37.5 C (99.5 F)).   amoxicillin (AMOXIL) 500 MG tablet Take 4 tablets by mouth 1 hour before dental procedures and cleanings   clotrimazole-betamethasone (LOTRISONE) cream Apply 1 application. topically 2 (two) times daily as needed (rash).   esomeprazole (NEXIUM) 20 MG capsule Take 1 capsule (20 mg total) by mouth daily.   furosemide (LASIX) 40 MG tablet Take 40 mg by mouth as directed. Take 40 mg daily alternating with 20 mg daily   gabapentin (NEURONTIN) 100 MG capsule Take 100 mg by mouth 2 (two) times daily.   levothyroxine (SYNTHROID) 112 MCG tablet Take 1 tablet (112 mcg total) by mouth every morning.   metoprolol succinate (TOPROL XL) 25 MG 24 hr tablet Take 1 tablet (25 mg total) by mouth daily.   PRADAXA 150 MG CAPS capsule TAKE 1 CAPSULE BY MOUTH TWICE A DAY   sertraline (ZOLOFT) 50 MG tablet Take 1 tablet (50 mg total) by mouth daily.   [DISCONTINUED] metoprolol succinate (TOPROL-XL) 50 MG 24 hr tablet Take 1 tablet (50 mg total) by mouth daily. Take with or immediately following a meal.     Allergies:   Patient has no active allergies.   Social History   Socioeconomic History   Marital status: Widowed    Spouse name: Not on file   Number of children: Not on file   Years of education: Not on file   Highest education level: Not on file  Occupational History   Not on file  Tobacco Use   Smoking status: Former    Types: Cigarettes    Quit date: 08/31/1980    Years since quitting: 40.8   Smokeless tobacco: Never   Tobacco comments:    quit over 20 Years ago  Vaping Use   Vaping Use: Never used  Substance and Sexual Activity    Alcohol use: Yes    Comment: occasionally   Drug use: Never   Sexual activity: Not Currently  Other Topics Concern   Not on file  Social History Narrative   Lives alone   Right Handed    Drinks 2 cups caffeine daily   Social Determinants of Health   Financial Resource Strain: Not on file  Food Insecurity: Not on file  Transportation Needs: Not on file  Physical Activity: Not on file  Stress: Not on file  Social Connections: Not on file    Family History: The patient's family history is not on file.  ROS:   Please see the history of present illness.    All other systems reviewed and are negative.  EKGs/Labs/Other Studies Reviewed:    The following studies were  reviewed today:  05/24/21 LAAO procedure CONCLUSIONS:  1.Successful implantation of a WATCHMAN left atrial appendage occlusive device    2. TEE demonstrating no LAA thrombus 3. No early apparent complications.    Post Implant Anticoagulation Strategy: Continue Pradaxa 150 mg by mouth twice daily for 45 days post implant.  If 45-day transesophageal echo criteria are met, stop Pradaxa and start Plavix 75 mg by mouth once daily to complete 6 months of total post implant therapy.   EKG:  EKG is not ordered today.    Recent Labs: 08/31/2020: ALT 12 09/02/2020: Magnesium 2.0 06/01/2021: NT-Pro BNP 4,150 07/04/2021: BUN 19; Creatinine, Ser 0.86; Hemoglobin 11.6; Platelets 181; Potassium 4.1; Sodium 139   Recent Lipid Panel    Component Value Date/Time   CHOL 134 09/01/2020 0434   TRIG 65 09/01/2020 0434   HDL 48 09/01/2020 0434   CHOLHDL 2.8 09/01/2020 0434   VLDL 13 09/01/2020 0434   LDLCALC 73 09/01/2020 0434    Physical Exam:    VS:  BP 112/68   Pulse 71   Ht '5\' 2"'  (1.575 m)   Wt 162 lb (73.5 kg)   SpO2 96%   BMI 29.63 kg/m     Wt Readings from Last 3 Encounters:  07/04/21 162 lb (73.5 kg)  06/08/21 157 lb 6.4 oz (71.4 kg)  06/01/21 167 lb 12.8 oz (76.1 kg)    General: Well developed, well  nourished, NAD Neck: Negative for carotid bruits. No JVD Lungs:Clear to ausculation bilaterally. Breathing is unlabored. Cardiovascular: RRR with S1 S2. No murmurs Extremities: No edema.  Neuro: Alert and oriented. No focal deficits. No facial asymmetry. MAE spontaneously. Psych: Responds to questions appropriately with normal affect.    ASSESSMENT/PLAN:    Chronic atrial fibrillation: s/p LAAO with Watchman FLX 8m. Continue Pradaxa. Plan for TEE 07/11/21 to assess device seal. If no leak or thrombus plan to transition to Plavix monotherapy if 45 day TEE requirements were met to complete 6 months of total post implant therapy and then transition to aspirin alone. Will need SBE with Amoxicillin for 6 month duration (11/23/21).   After careful review of history and examination, the risks and benefits of transesophageal echocardiogram have been explained including risks of esophageal damage, perforation (1:10,000 risk), bleeding, pharyngeal hematoma as well as other potential complications associated with conscious sedation including aspiration, arrhythmia, respiratory failure and death. Alternatives to treatment were discussed, questions were answered. Patient is willing to proceed.    Chronic diastolic CHF: She is up 5lb since 05/2021 however appears euvolemic on exam. at the last visit I increased her Lasix from 20 mg to 40 mg daily. Continue current Lasix regimen.     Hx of GI bleeding with anemia: Last Hb 11.6. No c/o of bleeding in stool or urine.    Medication Adjustments/Labs and Tests Ordered: Current medicines are reviewed at length with the patient today.  Concerns regarding medicines are outlined above.  Orders Placed This Encounter  Procedures   Basic metabolic panel   CBC   Meds ordered this encounter  Medications   amoxicillin (AMOXIL) 500 MG tablet    Sig: Take 4 tablets by mouth 1 hour before dental procedures and cleanings    Dispense:  12 tablet    Refill:  6    metoprolol succinate (TOPROL XL) 25 MG 24 hr tablet    Sig: Take 1 tablet (25 mg total) by mouth daily.    Dispense:  90 tablet    Refill:  3  Patient Instructions  Medication Instructions:  Start Amoxicillin 500 mg, take 4 tablets by mouth 1 hour before dental procedures and cleanings  Decrease Metoprolol to 25 mg daily   *If you need a refill on your cardiac medications before your next appointment, please call your pharmacy*   Lab Work: Bmp, Cbc- Today   If you have labs (blood work) drawn today and your tests are completely normal, you will receive your results only by: [place] (if you have MyChart) OR A paper copy in the mail If you have any lab test that is abnormal or we need to change your treatment, we will call you to review the results.   Testing/Procedures: Your physician has requested that you have a TEE. During a TEE, sound waves are used to create images of your heart. It provides your doctor with information about the size and shape of your heart and how well your heart's chambers and valves are working. In this test, a transducer is attached to the end of a flexible tube that's guided down your throat and into your esophagus (the tube leading from you mouth to your stomach) to get a more detailed image of your heart. You are not awake for the procedure. Please see the instruction sheet given to you today. For further information please visit [email or web address].   Follow-Up: Follow up as scheduled 1}    Other Instructions You are scheduled for a TEE on Wednesday, May 31 with Dr. Eleonore Chiquito.  Please arrive at the [facility] (Main Entrance A) at [facility]: [place], [place] [address] at 11 am.   DIET: Nothing to eat or drink after midnight except a sip of water with medications (see medication instructions below)  FYI: For your safety, and to allow [place] to monitor your vital signs accurately during the surgery/procedure we request  that   if you have artificial nails, gel coating, SNS etc. Please have those removed prior to your surgery/procedure. Not having the nail coverings /polish removed may result in cancellation or delay of your surgery/procedure.   Medication Instructions: Hold Lasix the morning of your procedure   Continue your anticoagulant: Pradaxa You will need to continue your anticoagulant after your procedure until you are told by your provider that it is safe to stop   Labs: If patient is on Coumadin, patient needs pt/INR, CBC, BMET within 3 days (No pt/INR needed for patients taking Xarelto, Eliquis, Pradaxa) For patients receiving anesthesia for TEE and all Cardioversion patients: BMET, CBC within 1 week You must have a responsible person to drive you home and stay in the waiting area during your procedure. Failure to do so could result in cancellation.  Bring your insurance cards.  *Special Note: Every effort is made to have your procedure done on time. Occasionally there are emergencies that occur at the hospital that may cause delays. Please be patient if a delay does occur.       Signed, Kathyrn Drown, NP  07/04/2021 3:01 PM    [facility]

## 2021-07-02 ENCOUNTER — Encounter (HOSPITAL_COMMUNITY): Payer: Self-pay | Admitting: Cardiovascular Disease

## 2021-07-04 ENCOUNTER — Ambulatory Visit: Payer: Medicare Other | Admitting: Cardiology

## 2021-07-04 VITALS — BP 112/68 | HR 71 | Ht 62.0 in | Wt 162.0 lb

## 2021-07-04 DIAGNOSIS — I10 Essential (primary) hypertension: Secondary | ICD-10-CM | POA: Diagnosis not present

## 2021-07-04 DIAGNOSIS — D649 Anemia, unspecified: Secondary | ICD-10-CM

## 2021-07-04 DIAGNOSIS — Z95818 Presence of other cardiac implants and grafts: Secondary | ICD-10-CM | POA: Diagnosis not present

## 2021-07-04 DIAGNOSIS — I5032 Chronic diastolic (congestive) heart failure: Secondary | ICD-10-CM

## 2021-07-04 DIAGNOSIS — Z01818 Encounter for other preprocedural examination: Secondary | ICD-10-CM

## 2021-07-04 LAB — CBC
Hematocrit: 35.4 % (ref 34.0–46.6)
Hemoglobin: 11.6 g/dL (ref 11.1–15.9)
MCH: 27 pg (ref 26.6–33.0)
MCHC: 32.8 g/dL (ref 31.5–35.7)
MCV: 83 fL (ref 79–97)
Platelets: 181 10*3/uL (ref 150–450)
RBC: 4.29 x10E6/uL (ref 3.77–5.28)
RDW: 15.8 % — ABNORMAL HIGH (ref 11.7–15.4)
WBC: 6.3 10*3/uL (ref 3.4–10.8)

## 2021-07-04 LAB — BASIC METABOLIC PANEL
BUN/Creatinine Ratio: 22 (ref 12–28)
BUN: 19 mg/dL (ref 8–27)
CO2: 23 mmol/L (ref 20–29)
Calcium: 8.8 mg/dL (ref 8.7–10.3)
Chloride: 106 mmol/L (ref 96–106)
Creatinine, Ser: 0.86 mg/dL (ref 0.57–1.00)
Glucose: 117 mg/dL — ABNORMAL HIGH (ref 70–99)
Potassium: 4.1 mmol/L (ref 3.5–5.2)
Sodium: 139 mmol/L (ref 134–144)
eGFR: 65 mL/min/{1.73_m2} (ref >59)

## 2021-07-04 MED ORDER — AMOXICILLIN 500 MG PO TABS: ORAL_TABLET | ORAL | 6 refills | Status: DC

## 2021-07-04 MED ORDER — METOPROLOL SUCCINATE ER 25 MG PO TB24: 25.0000 mg | ORAL_TABLET | Freq: Every day | ORAL | 3 refills | Status: DC

## 2021-07-04 NOTE — Patient Instructions (Addendum)
Medication Instructions:  Start Amoxicillin 500 mg, take 4 tablets by mouth 1 hour before dental procedures and cleanings  Decrease Metoprolol to 25 mg daily   *If you need a refill on your cardiac medications before your next appointment, please call your pharmacy*   Lab Work: Bmp, Cbc- Today   If you have labs (blood work) drawn today and your tests are completely normal, you will receive your results only by: [place] (if you have MyChart) OR A paper copy in the mail If you have any lab test that is abnormal or we need to change your treatment, we will call you to review the results.   Testing/Procedures: Your physician has requested that you have a TEE. During a TEE, sound waves are used to create images of your heart. It provides your doctor with information about the size and shape of your heart and how well your heart's chambers and valves are working. In this test, a transducer is attached to the end of a flexible tube that's guided down your throat and into your esophagus (the tube leading from you mouth to your stomach) to get a more detailed image of your heart. You are not awake for the procedure. Please see the instruction sheet given to you today. For further information please visit [email or web address].   Follow-Up: Follow up as scheduled 1}    Other Instructions You are scheduled for a TEE on Wednesday, May 31 with Dr. Eleonore Chiquito.  Please arrive at the [facility] (Main Entrance A) at [facility]: [place], [place] [address] at 11 am.   DIET: Nothing to eat or drink after midnight except a sip of water with medications (see medication instructions below)  FYI: For your safety, and to allow [place] to monitor your vital signs accurately during the surgery/procedure we request that   if you have artificial nails, gel coating, SNS etc. Please have those removed prior to your surgery/procedure. Not having the nail coverings /polish removed may  result in cancellation or delay of your surgery/procedure.   Medication Instructions: Hold Lasix the morning of your procedure   Continue your anticoagulant: Pradaxa You will need to continue your anticoagulant after your procedure until you are told by your provider that it is safe to stop   Labs: If patient is on Coumadin, patient needs pt/INR, CBC, BMET within 3 days (No pt/INR needed for patients taking Xarelto, Eliquis, Pradaxa) For patients receiving anesthesia for TEE and all Cardioversion patients: BMET, CBC within 1 week You must have a responsible person to drive you home and stay in the waiting area during your procedure. Failure to do so could result in cancellation.  Bring your insurance cards.  *Special Note: Every effort is made to have your procedure done on time. Occasionally there are emergencies that occur at the hospital that may cause delays. Please be patient if a delay does occur.

## 2021-07-11 ENCOUNTER — Other Ambulatory Visit: Payer: Self-pay

## 2021-07-11 ENCOUNTER — Ambulatory Visit (HOSPITAL_BASED_OUTPATIENT_CLINIC_OR_DEPARTMENT_OTHER): Payer: Medicare Other | Admitting: Anesthesiology

## 2021-07-11 ENCOUNTER — Encounter (HOSPITAL_COMMUNITY): Payer: Self-pay | Admitting: Cardiovascular Disease

## 2021-07-11 ENCOUNTER — Ambulatory Visit (HOSPITAL_COMMUNITY)
Admission: RE | Admit: 2021-07-11 | Discharge: 2021-07-11 | Disposition: A | Discharge status: Home / Self Care | Payer: Medicare Other | Attending: Cardiovascular Disease | Admitting: Cardiovascular Disease

## 2021-07-11 ENCOUNTER — Ambulatory Visit (HOSPITAL_BASED_OUTPATIENT_CLINIC_OR_DEPARTMENT_OTHER)
Admission: RE | Admit: 2021-07-11 | Discharge: 2021-07-11 | Disposition: A | Discharge status: Home / Self Care | Payer: Medicare Other | Source: Ambulatory Visit | Attending: Cardiology | Admitting: Cardiology

## 2021-07-11 ENCOUNTER — Encounter (HOSPITAL_COMMUNITY)
Admission: RE | Disposition: A | Discharge status: Home / Self Care | Payer: Self-pay | Source: Home / Self Care | Attending: Cardiovascular Disease

## 2021-07-11 ENCOUNTER — Ambulatory Visit (HOSPITAL_COMMUNITY): Payer: Medicare Other | Admitting: Anesthesiology

## 2021-07-11 DIAGNOSIS — F0390 Unspecified dementia without behavioral disturbance: Secondary | ICD-10-CM | POA: Insufficient documentation

## 2021-07-11 DIAGNOSIS — I700 Atherosclerosis of aorta: Secondary | ICD-10-CM | POA: Diagnosis not present

## 2021-07-11 DIAGNOSIS — I081 Rheumatic disorders of both mitral and tricuspid valves: Secondary | ICD-10-CM | POA: Insufficient documentation

## 2021-07-11 DIAGNOSIS — I340 Nonrheumatic mitral (valve) insufficiency: Secondary | ICD-10-CM

## 2021-07-11 DIAGNOSIS — I5032 Chronic diastolic (congestive) heart failure: Secondary | ICD-10-CM | POA: Diagnosis not present

## 2021-07-11 DIAGNOSIS — I4891 Unspecified atrial fibrillation: Secondary | ICD-10-CM | POA: Diagnosis not present

## 2021-07-11 DIAGNOSIS — I071 Rheumatic tricuspid insufficiency: Secondary | ICD-10-CM

## 2021-07-11 DIAGNOSIS — I361 Nonrheumatic tricuspid (valve) insufficiency: Secondary | ICD-10-CM

## 2021-07-11 DIAGNOSIS — I482 Chronic atrial fibrillation, unspecified: Secondary | ICD-10-CM | POA: Insufficient documentation

## 2021-07-11 DIAGNOSIS — Z87891 Personal history of nicotine dependence: Secondary | ICD-10-CM | POA: Diagnosis not present

## 2021-07-11 DIAGNOSIS — I110 Hypertensive heart disease with heart failure: Secondary | ICD-10-CM | POA: Diagnosis not present

## 2021-07-11 DIAGNOSIS — E039 Hypothyroidism, unspecified: Secondary | ICD-10-CM

## 2021-07-11 DIAGNOSIS — Z79899 Other long term (current) drug therapy: Secondary | ICD-10-CM | POA: Insufficient documentation

## 2021-07-11 DIAGNOSIS — Z7901 Long term (current) use of anticoagulants: Secondary | ICD-10-CM | POA: Insufficient documentation

## 2021-07-11 DIAGNOSIS — Z95818 Presence of other cardiac implants and grafts: Secondary | ICD-10-CM | POA: Insufficient documentation

## 2021-07-11 DIAGNOSIS — I10 Essential (primary) hypertension: Secondary | ICD-10-CM

## 2021-07-11 HISTORY — PX: TEE WITHOUT CARDIOVERSION: SHX5443

## 2021-07-11 LAB — ECHO TEE
MV M vel: 5.48 m/s
MV Peak grad: 120.1 mmHg
Radius: 0.3 cm

## 2021-07-11 SURGERY — ECHOCARDIOGRAM, TRANSESOPHAGEAL
Anesthesia: Monitor Anesthesia Care

## 2021-07-11 MED ORDER — BUTAMBEN-TETRACAINE-BENZOCAINE 2-2-14 % EX AERO
INHALATION_SPRAY | CUTANEOUS | Status: DC | PRN
  Administered 2021-07-11: 1 via TOPICAL

## 2021-07-11 MED ORDER — PROPOFOL 10 MG/ML IV BOLUS
INTRAVENOUS | Status: DC | PRN
  Administered 2021-07-11: 20 mg via INTRAVENOUS

## 2021-07-11 MED ORDER — PROPOFOL 500 MG/50ML IV EMUL
INTRAVENOUS | Status: DC | PRN
  Administered 2021-07-11: 100 ug/kg/min via INTRAVENOUS

## 2021-07-11 MED ORDER — SODIUM CHLORIDE 0.9 % IV SOLN: INTRAVENOUS | Status: DC

## 2021-07-11 MED ORDER — SODIUM CHLORIDE 0.9 % IV SOLN
INTRAVENOUS | Status: AC | PRN
  Administered 2021-07-11: 500 mL via INTRAVENOUS

## 2021-07-11 NOTE — Anesthesia Procedure Notes (Signed)
Procedure Name: MAC Date/Time: 07/11/2021 12:06 PM Performed by: Jenne Campus, CRNA Pre-anesthesia Checklist: Patient identified, Emergency Drugs available, Suction available and Patient being monitored Oxygen Delivery Method: Nasal cannula

## 2021-07-11 NOTE — CV Procedure (Signed)
    TRANSESOPHAGEAL ECHOCARDIOGRAM   NAME:  KAMIYAH KINDEL    MRN: [id number] DOB:  15-Nov-1930    ADMIT DATE: 07/11/2021  INDICATIONS: Post LAAC  PROCEDURE:   Informed consent was obtained prior to the procedure. The risks, benefits and alternatives for the procedure were discussed and the patient comprehended these risks.  Risks include, but are not limited to, cough, sore throat, vomiting, nausea, somnolence, esophageal and stomach trauma or perforation, bleeding, low blood pressure, aspiration, pneumonia, infection, trauma to the teeth and death.    Procedural time out performed. The oropharynx was anesthetized with topical 1% benzocaine.    Anesthesia was administered by Dr. Ola Spurr.  The patient was administered 198 mg of propofol and 0 mg of lidocaine to achieve and maintain moderate conscious sedation.  The patient's heart rate, blood pressure, and oxygen saturation are monitored continuously during the procedure. The period of conscious sedation is 15 minutes, of which I was present face-to-face 100% of this time.   The transesophageal probe was inserted in the esophagus and stomach without difficulty and multiple views were obtained.   COMPLICATIONS:    There were no immediate complications.  KEY FINDINGS:  Normal 27 mm Watchman FLX. No device leak or thrombus.  Normal LV function. Moderately dilated RV with reduced function.  Moderate to severe TR.  Full report to follow. Further management per primary team.   [place]ake Bells T. Audie Box, MD, [place]  [address], [place], [place] [address] [phone]  12:33 PM

## 2021-07-11 NOTE — Anesthesia Preprocedure Evaluation (Addendum)
Anesthesia Evaluation  Patient identified by MRN, date of birth, ID band Patient awake    Reviewed: Allergy & Precautions, NPO status , Patient's Chart, lab work & pertinent test results, reviewed documented beta blocker date and time   Airway Mallampati: II  TM Distance: >3 FB Neck ROM: Full    Dental no notable dental hx. (+) Teeth Intact, Caps, Dental Advisory Given   Pulmonary pneumonia, resolved, former smoker,    Pulmonary exam normal breath sounds clear to auscultation       Cardiovascular hypertension, Pt. on medications and Pt. on home beta blockers + dysrhythmias Atrial Fibrillation  Rhythm:Irregular Rate:Normal  EKG 04/30/21 Atrial fibrillation  Echo 05/31/20 1. Left ventricular ejection fraction, by estimation, is 55 to 60%. The left ventricle has normal function. The left ventricle has no regional wall motion abnormalities. Left ventricular diastolic function could not be evaluated.  2. Right ventricular systolic function is moderately reduced. The right ventricular size is mildly enlarged. There is severely elevated pulmonary artery systolic pressure. The estimated right ventricular systolic  pressure is 03.5 mmHg.  3. Left atrial size was severely dilated.  4. Right atrial size was severely dilated.  5. The pericardial effusion is posterior to the left ventricle.  6. The mitral valve is normal in structure. Mild mitral valve  regurgitation. No evidence of mitral stenosis.  7. Tricuspid valve regurgitation is mild to moderate.  8. The aortic valve is normal in structure. Aortic valve regurgitation is not visualized. No aortic stenosis is present.  9. The inferior vena cava is dilated in size with >50% respiratory variability, suggesting right atrial pressure of 8 mmHg.   Peripheral edema   Neuro/Psych PSYCHIATRIC DISORDERS Depression CVA, No Residual Symptoms    GI/Hepatic Neg liver ROS, GERD  Medicated and  Controlled,  Endo/Other  Hypothyroidism Obesity   Renal/GU negative Renal ROS  negative genitourinary   Musculoskeletal  (+) Arthritis , Osteoarthritis,    Abdominal (+) + obese,   Peds  Hematology  (+) Blood dyscrasia, anemia , Pradaxa therapy- last dose 4/10   Anesthesia Other Findings   Reproductive/Obstetrics                             Anesthesia Physical  Anesthesia Plan  ASA: 3  Anesthesia Plan: MAC   Post-op Pain Management: Minimal or no pain anticipated   Induction:   PONV Risk Score and Plan: 3 and Treatment may vary due to age or medical condition and Ondansetron  Airway Management Planned: Natural Airway and Nasal Cannula  Additional Equipment: None  Intra-op Plan:   Post-operative Plan:   Informed Consent: I have reviewed the patients History and Physical, chart, labs and discussed the procedure including the risks, benefits and alternatives for the proposed anesthesia with the patient or authorized representative who has indicated his/her understanding and acceptance.     Dental advisory given  Plan Discussed with: CRNA and Anesthesiologist  Anesthesia Plan Comments:         Anesthesia Quick Evaluation

## 2021-07-11 NOTE — Discharge Instructions (Signed)

## 2021-07-11 NOTE — Transfer of Care (Signed)
Immediate Anesthesia Transfer of Care Note  Patient: Katrina Randolph  Procedure(s) Performed: TRANSESOPHAGEAL ECHOCARDIOGRAM (TEE)  Patient Location: Short Stay  Anesthesia Type:MAC  Level of Consciousness: drowsy  Airway & Oxygen Therapy: Patient Spontanous Breathing and Patient connected to nasal cannula oxygen  Post-op Assessment: Report given to RN and Post -op Vital signs reviewed and stable  Post vital signs: Reviewed and stable  Last Vitals:  Vitals Value Taken Time  BP 103/79 07/11/21 1231  Temp    Pulse 127 07/11/21 1232  Resp 14 07/11/21 1232  SpO2 95 % 07/11/21 1232  Vitals shown include unvalidated device data.  Last Pain:  Vitals:   07/11/21 1140  TempSrc: Temporal  PainSc: 0-No pain         Complications: No notable events documented.

## 2021-07-11 NOTE — Interval H&P Note (Signed)
History and Physical Interval Note:  07/11/2021 11:20 AM  Katrina Randolph  has presented today for surgery, with the diagnosis of AFIB, POST WATCHMAN.  The various methods of treatment have been discussed with the patient and family. After consideration of risks, benefits and other options for treatment, the patient has consented to  Procedure(s): TRANSESOPHAGEAL ECHOCARDIOGRAM (TEE) (N/A) as a surgical intervention.  The patient's history has been reviewed, patient examined, no change in status, stable for surgery.  I have reviewed the patient's chart and labs.  Questions were answered to the patient's satisfaction.    NPO for post watchman FLX evaluation.   [place] Bells T. Audie Box, MD, [place]  [address] [place], [place] [address] [phone]  11:20 AM

## 2021-07-11 NOTE — Progress Notes (Signed)
  Echocardiogram Echocardiogram Transesophageal has been performed.  Darlina Sicilian M 07/11/2021, 12:54 PM

## 2021-07-12 ENCOUNTER — Telehealth: Payer: Self-pay

## 2021-07-12 MED ORDER — PANTOPRAZOLE SODIUM 40 MG PO TBEC: 40.0000 mg | DELAYED_RELEASE_TABLET | Freq: Every day | ORAL | 1 refills | Status: DC

## 2021-07-12 MED ORDER — CLOPIDOGREL BISULFATE 75 MG PO TABS: 75.0000 mg | ORAL_TABLET | Freq: Every day | ORAL | 1 refills | Status: DC

## 2021-07-12 NOTE — Anesthesia Postprocedure Evaluation (Signed)
Anesthesia Post Note  Patient: Katrina Randolph  Procedure(s) Performed: TRANSESOPHAGEAL ECHOCARDIOGRAM (TEE)     Patient location during evaluation: PACU Anesthesia Type: MAC Level of consciousness: awake and alert Pain management: pain level controlled Vital Signs Assessment: post-procedure vital signs reviewed and stable Respiratory status: spontaneous breathing, nonlabored ventilation, respiratory function stable and patient connected to nasal cannula oxygen Cardiovascular status: stable and blood pressure returned to baseline Postop Assessment: no apparent nausea or vomiting Anesthetic complications: no   No notable events documented.  Last Vitals:  Vitals:   07/11/21 1247 07/11/21 1254  BP: (!) 136/95 138/87  Pulse: (!) 46 (!) 45  Resp: 19 16  Temp:    SpO2: 92% 99%    Last Pain:  Vitals:   07/11/21 1140  TempSrc: Temporal  PainSc: 0-No pain                 Tiajuana Amass

## 2021-07-12 NOTE — Telephone Encounter (Signed)
TEE results from 07/11/2021 (Dr. Audie Box): "Left Atrium: 60 Watchman FLX. No device leak. No device related thrombus.  Device is well-seated. Left atrial size was severely dilated. No left  atrial/left atrial appendage thrombus was detected.   Per Watchman protocol, TEE criteria met and instructions given: "Continue Pradaxa 150 mg by mouth twice daily for 45 days post implant.  If 45-day transesophageal echo criteria are met, stop Pradaxa and start Plavix 75 mg by mouth once daily to complete 6 months of total post implant therapy."  The patient will STOP PRADAXA and START PLAVIX 75 mg once daily.  She will also STOP NEXIUM and START PANTOPRAZOLE 40 mg daily. Scheduled her for 6 month LAAO visit 11/19/2021. She was grateful for call and agrees with plan.

## 2021-07-13 NOTE — Addendum Note (Signed)
Addended by: Harland German A on: 07/13/2021 12:17 PM   Modules accepted: Orders

## 2021-07-13 NOTE — Telephone Encounter (Signed)
Confirmed with the patient she only takes Nexium PRN and it is taken seldomly.  She will not pick up pantoprazole. Pharmacy called and rx cancelled. She will take Nexium PRN and will call if she takes more frequently for a new rx. She was grateful for call and agrees with plan.   Med list updated.

## 2021-08-18 NOTE — Progress Notes (Unsigned)
Cardiology Office Note   Date:  08/18/2021   ID:  VELNA HEDGECOCK, DOB Aug 14, 1930, MRN [id number]  PCP:  [name], PA-C  Cardiologist:   [name], MD   No chief complaint on file.     History of Present Illness: Katrina Randolph is a 88 y.o. female who for follow up of chronic atrial fibrillation.  She is not on anticoagulation due to anemia that has required multiple transfusions.  There is been no clear source of her bleeding.  She was admitted to the hospital 10/12/2019-10/20/2019 with rapid atrial fibrillation, pneumonia, anemia, acute thrombocytopenia, and orthostatic dizziness.  She had presented to her PCP and was found to have a tachycardic rhythm with a heart rate greater than 160 bpm.  We were involved to help with heart rate control.  She had active bleeding and GI evaluation with colonoscopy and EGD with no clear source identified.  She has a hiatal hernia and Cameron's erosions.  She has some small polyps and diverticulosis as well as hemorrhoids.  She has had orthostatic hypotension.   Cozaar was stopped.   She was being considered for a Watchman but eventually had a CVA.   She ultimately had Watchman.  TEE follow up demonstrated normal Watchman with moderate to severe TR and RAE.  Pradaxa was stopped and she was started on Plavix.  ***   She returns for follow up.  ***    ***   I had sent her to consider Watchman but she declined.  She subsequently had a CVA.   She was subsequently treated with Pradaxa.   However, she took herself off the Pradaxa.  It turns out she has been on the beta-blocker.  She does not feel her atrial fibrillation.  She has not had any presyncope or syncope although she has some mild dizziness.  She has some chronic weakness and some gait disturbance.  She is close to work with a cane.  She says her memory is off a little bit.  She has a little expressive aphasia.  She is not describing any chest pressure, neck or arm discomfort.  She is  not having any new shortness of breath, PND or orthopnea.   Past Medical History:  Diagnosis Date   Atrial fibrillation ([place])    Presence of Watchman left atrial appendage closure device 05/24/2021   Watchman FLX 39m with Dr. LQuentin Ore   Past Surgical History:  Procedure Laterality Date   ABDOMINAL HYSTERECTOMY     BIOPSY  10/19/2019   Procedure: BIOPSY;  Surgeon: BOtis Brace MD;  Location: [facility];  Service: Gastroenterology;;   BREAST LUMPECTOMY     CHOLECYSTECTOMY     COLONOSCOPY WITH PROPOFOL N/A 10/19/2019   Procedure: COLONOSCOPY WITH PROPOFOL;  Surgeon: BOtis Brace MD;  Location: [facility];  Service: Gastroenterology;  Laterality: N/A;   ESOPHAGOGASTRODUODENOSCOPY (EGD) WITH PROPOFOL N/A 10/19/2019   Procedure: ESOPHAGOGASTRODUODENOSCOPY (EGD) WITH PROPOFOL;  Surgeon: BOtis Brace MD;  Location: [facility];  Service: Gastroenterology;  Laterality: N/A;   LEFT ATRIAL APPENDAGE OCCLUSION N/A 05/24/2021   Procedure: LEFT ATRIAL APPENDAGE OCCLUSION;  Surgeon: LVickie Epley MD;  [facility];  Service: Cardiovascular;  Laterality: N/A;   POLYPECTOMY  10/19/2019   Procedure: POLYPECTOMY;  Surgeon: BOtis Brace MD;  Location: [facility];  Service: Gastroenterology;;   TEE WITHOUT CARDIOVERSION N/A 05/24/2021   Procedure: TRANSESOPHAGEAL ECHOCARDIOGRAM (TEE);  Surgeon: LVickie Epley MD;  [facility];  Service: Cardiovascular;  Laterality: N/A;   TEE WITHOUT CARDIOVERSION N/A 07/11/2021   Procedure: TRANSESOPHAGEAL ECHOCARDIOGRAM (TEE);  Surgeon: Geralynn Rile, MD;  Location: [facility];  Service: Cardiovascular;  Laterality: N/A;     Current Outpatient Medications  Medication Sig Dispense Refill   acetaminophen (TYLENOL) 325 MG tablet Take 2 tablets (650 mg total) by mouth every 4 (four) hours as needed for mild pain (or temp > 37.5 C (99.5 F)). (Patient taking differently: Take 325-650 mg by mouth  every 4 (four) hours as needed for mild pain (or temp > 37.5 C (99.5 F)).)     amoxicillin (AMOXIL) 500 MG tablet Take 4 tablets by mouth 1 hour before dental procedures and cleanings 12 tablet 6   clopidogrel (PLAVIX) 75 MG tablet Take 1 tablet (75 mg total) by mouth daily. 90 tablet 1   esomeprazole (NEXIUM) 20 MG capsule Take 20 mg by mouth daily as needed.     furosemide (LASIX) 40 MG tablet Take 40 mg by mouth daily as needed for fluid or edema.     gabapentin (NEURONTIN) 100 MG capsule Take 100 mg by mouth 2 (two) times daily.     Glycerin-Hypromellose-PEG 400 (ARTIFICIAL TEARS) 0.2-0.2-1 % SOLN Place 1 drop into both eyes daily as needed (tears).     levothyroxine (SYNTHROID) 112 MCG tablet Take 1 tablet (112 mcg total) by mouth every morning. 30 tablet 0   metoprolol succinate (TOPROL XL) 25 MG 24 hr tablet Take 1 tablet (25 mg total) by mouth daily. 90 tablet 3   sertraline (ZOLOFT) 50 MG tablet Take 1 tablet (50 mg total) by mouth daily. 30 tablet 0   No current facility-administered medications for this visit.    Allergies:   Patient has no active allergies.    ROS:  Please see the history of present illness.   Otherwise, review of systems are positive for ***.   All other systems are reviewed and negative.    PHYSICAL EXAM: VS:  There were no vitals taken for this visit. , BMI There is no height or weight on file to calculate BMI. GENERAL:  Well appearing NECK:  No jugular venous distention, waveform within normal limits, carotid upstroke brisk and symmetric, no bruits, no thyromegaly LUNGS:  Clear to auscultation bilaterally CHEST:  Unremarkable HEART:  PMI not displaced or sustained,S1 and S2 within normal limits, no S3, no S4, no clicks, no rubs, *** murmurs ABD:  Flat, positive bowel sounds normal in frequency in pitch, no bruits, no rebound, no guarding, no midline pulsatile mass, no hepatomegaly, no splenomegaly EXT:  2 plus pulses throughout, no edema, no cyanosis no  clubbing     ***GENERAL:  Well appearing NECK:  No jugular venous distention, waveform within normal limits, carotid upstroke brisk and symmetric, no bruits, no thyromegaly LUNGS:  Clear to auscultation bilaterally CHEST:  Unremarkable HEART:  PMI not displaced or sustained,S1 and S2 within normal limits, no S3,  no clicks, no rubs, no murmurs, irregular ABD:  Flat, positive bowel sounds normal in frequency in pitch, no bruits, no rebound, no guarding, no midline pulsatile mass, no hepatomegaly, no splenomegaly EXT:  2 plus pulses throughout, no edema, no cyanosis no clubbing  EKG:  EKG is *** ordered today. Atrial fibrillation, ***  rapid ventricular rate, low voltage in limb leads, axis within normal limits, intervals within normal limits, no acute ST-T wave changes.   Recent Labs: 08/31/2020: ALT 12 09/02/2020: Magnesium 2.0 06/01/2021: NT-Pro BNP 4,150 07/04/2021: BUN 19; Creatinine, Ser 0.86;  Hemoglobin 11.6; Platelets 181; Potassium 4.1; Sodium 139    Lipid Panel    Component Value Date/Time   CHOL 134 09/01/2020 0434   TRIG 65 09/01/2020 0434   HDL 48 09/01/2020 0434   CHOLHDL 2.8 09/01/2020 0434   VLDL 13 09/01/2020 0434   LDLCALC 73 09/01/2020 0434      Wt Readings from Last 3 Encounters:  07/11/21 162 lb (73.5 kg)  07/04/21 162 lb (73.5 kg)  06/08/21 157 lb 6.4 oz (71.4 kg)      Other studies Reviewed: Additional studies/ records that were reviewed today include: *** Review of the above records demonstrates:  Please see elsewhere in the note.     ASSESSMENT AND PLAN:   Atrial fibrillation with RVR:   ***    Katrina Randolph has a CHA2DS2 - VASc score of 6.  I had a long conversation with her and she does not want to get committed to taking a blood thinner.  She does not want to commit to getting the Watchman.  I described to her very high risk of another event.  I have suggested she reconsider the Watchman.  I offered to call and talk to her daughter she  says this will be her decision.  She will let me know.  In the meantime her rate is too fast but she has been out of her beta-blocker for weeks and so I am going to represcribe this.   She has a high fall risk but was tolerating the Pradaxa.  At the least I think she should restart this although this is not the better choice.  I think the risk of neither anticoagulation for Watchman is very high risk and she understands this conversation.  Essential hypertension: *** Her blood pressure will be managed in the context of controlling her rate.  Orthostatic hypotension:    ***  She does have a little dizziness.  She understands being very careful.  She came in here without her cane today awaiting discussion about this.   Hypothyroidism:  ***   I do not see a recent TSH and she should get one when she returns.  Watchman:  She is to have follow up in October.  ***   Current medicines are reviewed at length with the patient today.  The patient does not have concerns regarding medicines.  The following changes have been made:  ***  Labs/ tests ordered today include:  ***  No orders of the defined types were placed in this encounter.    Disposition:   FU with *** months.    Signed, [name], MD  08/18/2021 5:03 PM    [place]  ]

## 2021-08-20 ENCOUNTER — Ambulatory Visit: Payer: Medicare Other | Admitting: Cardiology

## 2021-08-20 ENCOUNTER — Encounter: Payer: Self-pay | Admitting: Cardiology

## 2021-08-20 VITALS — BP 126/84 | HR 45 | Ht 62.0 in | Wt 161.6 lb

## 2021-08-20 DIAGNOSIS — I4891 Unspecified atrial fibrillation: Secondary | ICD-10-CM | POA: Diagnosis not present

## 2021-08-20 DIAGNOSIS — E039 Hypothyroidism, unspecified: Secondary | ICD-10-CM | POA: Diagnosis not present

## 2021-08-20 DIAGNOSIS — I951 Orthostatic hypotension: Secondary | ICD-10-CM | POA: Diagnosis not present

## 2021-08-20 DIAGNOSIS — I10 Essential (primary) hypertension: Secondary | ICD-10-CM

## 2021-08-20 DIAGNOSIS — R21 Rash and other nonspecific skin eruption: Secondary | ICD-10-CM

## 2021-08-20 DIAGNOSIS — Z95818 Presence of other cardiac implants and grafts: Secondary | ICD-10-CM

## 2021-08-20 NOTE — Patient Instructions (Signed)

## 2021-08-23 ENCOUNTER — Encounter: Payer: Self-pay | Admitting: *Deleted

## 2021-09-03 ENCOUNTER — Emergency Department (HOSPITAL_COMMUNITY): Payer: Medicare Other

## 2021-09-03 ENCOUNTER — Emergency Department (HOSPITAL_COMMUNITY)
Admission: EM | Admit: 2021-09-03 | Discharge: 2021-09-05 | Disposition: A | Discharge status: Home / Self Care | Payer: Medicare Other | Attending: Emergency Medicine | Admitting: Emergency Medicine

## 2021-09-03 ENCOUNTER — Other Ambulatory Visit: Payer: Self-pay

## 2021-09-03 DIAGNOSIS — M47812 Spondylosis without myelopathy or radiculopathy, cervical region: Secondary | ICD-10-CM | POA: Diagnosis not present

## 2021-09-03 DIAGNOSIS — Z20822 Contact with and (suspected) exposure to covid-19: Secondary | ICD-10-CM | POA: Insufficient documentation

## 2021-09-03 DIAGNOSIS — R296 Repeated falls: Secondary | ICD-10-CM | POA: Insufficient documentation

## 2021-09-03 DIAGNOSIS — M50322 Other cervical disc degeneration at C5-C6 level: Secondary | ICD-10-CM | POA: Insufficient documentation

## 2021-09-03 DIAGNOSIS — Z7902 Long term (current) use of antithrombotics/antiplatelets: Secondary | ICD-10-CM | POA: Insufficient documentation

## 2021-09-03 DIAGNOSIS — I10 Essential (primary) hypertension: Secondary | ICD-10-CM | POA: Diagnosis not present

## 2021-09-03 DIAGNOSIS — I4891 Unspecified atrial fibrillation: Secondary | ICD-10-CM | POA: Diagnosis not present

## 2021-09-03 DIAGNOSIS — S32010A Wedge compression fracture of first lumbar vertebra, initial encounter for closed fracture: Secondary | ICD-10-CM | POA: Diagnosis not present

## 2021-09-03 DIAGNOSIS — S3992XA Unspecified injury of lower back, initial encounter: Secondary | ICD-10-CM | POA: Diagnosis present

## 2021-09-03 DIAGNOSIS — I6782 Cerebral ischemia: Secondary | ICD-10-CM | POA: Diagnosis not present

## 2021-09-03 DIAGNOSIS — E860 Dehydration: Secondary | ICD-10-CM

## 2021-09-03 DIAGNOSIS — R2681 Unsteadiness on feet: Secondary | ICD-10-CM | POA: Diagnosis not present

## 2021-09-03 DIAGNOSIS — Z8673 Personal history of transient ischemic attack (TIA), and cerebral infarction without residual deficits: Secondary | ICD-10-CM | POA: Diagnosis not present

## 2021-09-03 DIAGNOSIS — W1830XA Fall on same level, unspecified, initial encounter: Secondary | ICD-10-CM | POA: Insufficient documentation

## 2021-09-03 LAB — COMPREHENSIVE METABOLIC PANEL
ALT: 13 U/L (ref 0–44)
AST: 17 U/L (ref 15–41)
Albumin: 3.7 g/dL (ref 3.5–5.0)
Alkaline Phosphatase: 79 U/L (ref 38–126)
Anion gap: 6 (ref 5–15)
BUN: 19 mg/dL (ref 8–23)
CO2: 26 mmol/L (ref 22–32)
Calcium: 9.2 mg/dL (ref 8.9–10.3)
Chloride: 112 mmol/L — ABNORMAL HIGH (ref 98–111)
Creatinine, Ser: 0.92 mg/dL (ref 0.44–1.00)
GFR, Estimated: 60 mL/min — ABNORMAL LOW (ref >60)
Glucose, Bld: 115 mg/dL — ABNORMAL HIGH (ref 70–99)
Potassium: 3.8 mmol/L (ref 3.5–5.1)
Sodium: 144 mmol/L (ref 135–145)
Total Bilirubin: 1.4 mg/dL — ABNORMAL HIGH (ref 0.3–1.2)
Total Protein: 6.9 g/dL (ref 6.5–8.1)

## 2021-09-03 LAB — CBG MONITORING, ED: Glucose-Capillary: 110 mg/dL — ABNORMAL HIGH (ref 70–99)

## 2021-09-03 LAB — CBC WITH DIFFERENTIAL/PLATELET
Abs Immature Granulocytes: 0.02 10*3/uL (ref 0.00–0.07)
Basophils Absolute: 0 10*3/uL (ref 0.0–0.1)
Basophils Relative: 1 %
Eosinophils Absolute: 0.1 10*3/uL (ref 0.0–0.5)
Eosinophils Relative: 2 %
HCT: 39.2 % (ref 36.0–46.0)
Hemoglobin: 12.2 g/dL (ref 12.0–15.0)
Immature Granulocytes: 0 %
Lymphocytes Relative: 6 %
Lymphs Abs: 0.4 10*3/uL — ABNORMAL LOW (ref 0.7–4.0)
MCH: 27.7 pg (ref 26.0–34.0)
MCHC: 31.1 g/dL (ref 30.0–36.0)
MCV: 88.9 fL (ref 80.0–100.0)
Monocytes Absolute: 0.5 10*3/uL (ref 0.1–1.0)
Monocytes Relative: 7 %
Neutro Abs: 6.3 10*3/uL (ref 1.7–7.7)
Neutrophils Relative %: 84 %
Platelets: 165 10*3/uL (ref 150–400)
RBC: 4.41 MIL/uL (ref 3.87–5.11)
RDW: 16.2 % — ABNORMAL HIGH (ref 11.5–15.5)
WBC: 7.4 10*3/uL (ref 4.0–10.5)
nRBC: 0 % (ref 0.0–0.2)

## 2021-09-03 LAB — URINALYSIS, ROUTINE W REFLEX MICROSCOPIC
Bacteria, UA: NONE SEEN
Bilirubin Urine: NEGATIVE
Glucose, UA: NEGATIVE mg/dL
Ketones, ur: NEGATIVE mg/dL
Nitrite: NEGATIVE
Protein, ur: NEGATIVE mg/dL
Specific Gravity, Urine: >1.046 — ABNORMAL HIGH (ref 1.005–1.030)
pH: 5 (ref 5.0–8.0)

## 2021-09-03 LAB — SARS CORONAVIRUS 2 BY RT PCR: SARS Coronavirus 2 by RT PCR: NEGATIVE

## 2021-09-03 MED ORDER — LACTATED RINGERS IV BOLUS
500.0000 mL | Freq: Once | INTRAVENOUS | Status: AC
  Administered 2021-09-03: 500 mL via INTRAVENOUS

## 2021-09-03 MED ORDER — ACETAMINOPHEN 500 MG PO TABS
1000.0000 mg | ORAL_TABLET | Freq: Once | ORAL | Status: AC
  Administered 2021-09-03: 1000 mg via ORAL

## 2021-09-03 MED ORDER — LIDOCAINE 5 % EX PTCH
1.0000 | MEDICATED_PATCH | CUTANEOUS | Status: DC
  Administered 2021-09-03 – 2021-09-04 (×2): 1 via TRANSDERMAL
  Filled 2021-09-03: qty 1

## 2021-09-03 MED ORDER — FENTANYL CITRATE PF 50 MCG/ML IJ SOSY
PREFILLED_SYRINGE | INTRAMUSCULAR | Status: AC
  Filled 2021-09-03: qty 1

## 2021-09-03 MED ORDER — FENTANYL CITRATE PF 50 MCG/ML IJ SOSY
25.0000 ug | PREFILLED_SYRINGE | Freq: Once | INTRAMUSCULAR | Status: AC
  Administered 2021-09-03: 25 ug via INTRAVENOUS

## 2021-09-03 MED ORDER — ACETAMINOPHEN 500 MG PO TABS
ORAL_TABLET | ORAL | Status: AC
  Filled 2021-09-03: qty 2

## 2021-09-03 MED ORDER — LIDOCAINE 5 % EX PTCH
MEDICATED_PATCH | CUTANEOUS | Status: AC
  Filled 2021-09-03: qty 1

## 2021-09-03 MED ORDER — IOHEXOL 300 MG/ML  SOLN
100.0000 mL | Freq: Once | INTRAMUSCULAR | Status: AC | PRN
  Administered 2021-09-03: 100 mL via INTRAVENOUS

## 2021-09-03 NOTE — ED Triage Notes (Addendum)
Pt BIBA from home. Suffered fall 1x week ago, resulting in L back pain. Did not go to ER. Has been falling more recently, slid out of bed this AM, and decided to call EMS. Pt c/o weakness, has R sided deficit from previous stroke.   Does not recall hitting her head during any of her recent falls.  Several bruises in various states of healing on R side.  Unsure if she takes blood thinners  Aox4  HR: 90 AFIB, has hx BP: 162/80 SPO2: 99 RA CBG: 116

## 2021-09-03 NOTE — ED Provider Notes (Signed)
[facility] DEPT Provider Note   CSN: [id number] Arrival date & time: 09/03/21  1546     History {Add pertinent medical, surgical, social history, OB history to HPI:1} Chief Complaint  Patient presents with   Weakness    fa   Fall    Katrina Randolph is a 88 y.o. female.  HPI     88yo female with history of atrial fibrillation (not on anticoagulation due to anemia/requiring multiple transfusions without clear source of bleeding), watchman procedure, CVA on plavix with residual right sided weakness, hypertension, who presents with concern for weakness, back pain.   Had a fall, now has left sided back pain Fell yesterday and had another fall one week ago When first fell, felt like the groin was touchy but didn't last (was right side) Now the back is hurting, middle of the back, left side, severe pain If put feet on floor from bed hurts to sit up Was able to put weight on hip but pain left pelvis, some abdominal pain No chest pain, no shortness of breath No headache, no neck pain No fever, nausea, vomiting, urinary symptoms (does not have good control at baseline, diarrhea, black or bloody stool.  Chronic cough since stroke/unchanged     Right side also seems more numb, right side weakness worse, feels similar stroke symptoms but worse, right hand is weaker than it previously was.  Speech difficulty appears the same  Feel fatigued ever since watchman in May, not really changed since then.      Past Medical History:  Diagnosis Date   Atrial fibrillation ([place])    Presence of Watchman left atrial appendage closure device 05/24/2021   Watchman FLX 41m with Dr. LQuentin Ore    Home Medications Prior to Admission medications   Medication Sig Start Date End Date Taking? Authorizing Provider  acetaminophen (TYLENOL) 325 MG tablet Take 2 tablets (650 mg total) by mouth every 4 (four) hours as needed for mild pain (or temp > 37.5 C (99.5  F)). Patient taking differently: Take 325-650 mg by mouth every 4 (four) hours as needed for mild pain (or temp > 37.5 C (99.5 F)). 06/13/20   Angiulli, DLavon Paganini PA-C  amoxicillin (AMOXIL) 500 MG tablet Take 4 tablets by mouth 1 hour before dental procedures and cleanings 07/04/21   MTommie Raymond NP  clopidogrel (PLAVIX) 75 MG tablet Take 1 tablet (75 mg total) by mouth daily. 07/12/21 01/08/22  LVickie Epley MD  esomeprazole (NEXIUM) 20 MG capsule Take 20 mg by mouth daily as needed.    [provider]  furosemide (LASIX) 40 MG tablet Take 40 mg by mouth daily as needed for fluid or edema.    [provider]  gabapentin (NEURONTIN) 100 MG capsule Take 100 mg by mouth 2 (two) times daily. 03/21/21   [provider]  Glycerin-Hypromellose-PEG 400 (ARTIFICIAL TEARS) 0.2-0.2-1 % SOLN Place 1 drop into both eyes daily as needed (tears).    [provider]  levothyroxine (SYNTHROID) 112 MCG tablet Take 1 tablet (112 mcg total) by mouth every morning. 06/13/20   Angiulli, DLavon Paganini PA-C  metoprolol succinate (TOPROL XL) 25 MG 24 hr tablet Take 1 tablet (25 mg total) by mouth daily. 07/04/21   MKathyrn DrownD, NP  sertraline (ZOLOFT) 50 MG tablet Take 1 tablet (50 mg total) by mouth daily. 06/13/20   Angiulli, DLavon Paganini PA-C      Allergies    Patient has no known allergies.  Review of Systems   Review of Systems  Physical Exam Updated Vital Signs Ht '5\' 2"'$  (1.575 m)   Wt 73 kg   SpO2 99%   BMI 29.44 kg/m  Physical Exam Vitals and nursing note reviewed.  Constitutional:      General: She is not in acute distress.    Appearance: She is well-developed. She is not diaphoretic.  HENT:     Head: Normocephalic and atraumatic.  Eyes:     Conjunctiva/sclera: Conjunctivae normal.  Cardiovascular:     Rate and Rhythm: Normal rate and regular rhythm.     Heart sounds: Normal heart sounds. No murmur heard.    No friction rub. No gallop.  Pulmonary:     Effort:  Pulmonary effort is normal. No respiratory distress.     Breath sounds: Normal breath sounds. No wheezing or rales.  Abdominal:     General: There is no distension.     Palpations: Abdomen is soft.     Tenderness: There is abdominal tenderness (LLQ). There is no guarding.  Musculoskeletal:        General: Tenderness (Lspine, left pelvis, left flank) present.     Cervical back: Normal range of motion.  Skin:    General: Skin is warm and dry.     Findings: No erythema or rash.  Neurological:     Mental Status: She is alert and oriented to person, place, and time.     Motor: Weakness: mild right sided weakness, right LE numbness.     ED Results / Procedures / Treatments   Labs (all labs ordered are listed, but only abnormal results are displayed) Labs Reviewed  BASIC METABOLIC PANEL  CBC  URINALYSIS, ROUTINE W REFLEX MICROSCOPIC  CBG MONITORING, ED    EKG None  Radiology No results found.  Procedures Procedures  {Document cardiac monitor, telemetry assessment procedure when appropriate:1}  Medications Ordered in ED Medications - No data to display  ED Course/ Medical Decision Making/ A&P                           Medical Decision Making Amount and/or Complexity of Data Reviewed Labs: ordered. Radiology: ordered.  Risk Prescription drug management.   88yo female with history of atrial fibrillation (not on anticoagulation due to anemia/requiring multiple transfusions without clear source of bleeding), watchman procedure, CVA on plavix with residual right sided weakness, hypertension, who presents with concern for weakness, back pain after a fall one week ago.   Differential diagnosis for weakness includes weakness in the setting of trauma and pain, arrhythmia, anemia, electrolyte abnormalities, infection, CVA, ICH.  Labs are completed and personally evaluated interpreted by me and show a CBC with a hemoglobin of 12.2, no leukocytosis, COVID testing negative, no  clinically significant electrolyte abnormality.  CT head completed given worsening weakness and history of fall on Plavix shows no evidence of intracranial hemorrhage, no evidence of skull fracture or cervical spine fracture.  She reported abdominal pain in addition to her hip and back pain, CT abdomen pelvis with lumbar spine imaging were obtained which showed no evidence of intra-abdominal or intrapelvic traumatic injury, no lower rib fractures with noted left lower lobe bronchiectasis.  CT L-spine shows a mild L1 compression fracture with 15% height loss.  Suspect this is likely etiology of her pain.  Given concern that her prior stroke symptoms have worsened, ordered an MRI to evaluate for signs of new CVA.  Urinalysis ***.   {  Document critical care time when appropriate:1} {Document review of labs and clinical decision tools ie heart score, Chads2Vasc2 etc:1}  {Document your independent review of radiology images, and any outside records:1} {Document your discussion with family members, caretakers, and with consultants:1} {Document social determinants of health affecting pt's care:1} {Document your decision making why or why not admission, treatments were needed:1} Final Clinical Impression(s) / ED Diagnoses Final diagnoses:  None    Rx / DC Orders ED Discharge Orders     None

## 2021-09-04 ENCOUNTER — Encounter (HOSPITAL_COMMUNITY): Payer: Self-pay | Admitting: Emergency Medicine

## 2021-09-04 MED ORDER — SERTRALINE HCL 50 MG PO TABS
50.0000 mg | ORAL_TABLET | Freq: Every day | ORAL | Status: DC
  Administered 2021-09-04 – 2021-09-05 (×2): 50 mg via ORAL
  Filled 2021-09-04 (×2): qty 1

## 2021-09-04 MED ORDER — OXYCODONE HCL 5 MG PO TABS: 5.0000 mg | ORAL_TABLET | ORAL | Status: DC | PRN

## 2021-09-04 MED ORDER — LEVOTHYROXINE SODIUM 112 MCG PO TABS: 112.0000 ug | ORAL_TABLET | Freq: Every morning | ORAL | Status: DC

## 2021-09-04 MED ORDER — CLOPIDOGREL BISULFATE 75 MG PO TABS
75.0000 mg | ORAL_TABLET | Freq: Every day | ORAL | Status: DC
  Administered 2021-09-04: 75 mg via ORAL
  Filled 2021-09-04 (×2): qty 1

## 2021-09-04 MED ORDER — GABAPENTIN 100 MG PO CAPS
100.0000 mg | ORAL_CAPSULE | Freq: Two times a day (BID) | ORAL | Status: DC
  Administered 2021-09-04 – 2021-09-05 (×4): 100 mg via ORAL
  Filled 2021-09-04 (×4): qty 1

## 2021-09-04 MED ORDER — ACETAMINOPHEN 500 MG PO TABS: 1000.0000 mg | ORAL_TABLET | Freq: Four times a day (QID) | ORAL | Status: DC | PRN

## 2021-09-04 MED ORDER — METOPROLOL SUCCINATE ER 50 MG PO TB24
25.0000 mg | ORAL_TABLET | Freq: Every day | ORAL | Status: DC
  Administered 2021-09-04 – 2021-09-05 (×2): 25 mg via ORAL
  Filled 2021-09-04 (×2): qty 1

## 2021-09-04 MED ORDER — LEVOTHYROXINE SODIUM 112 MCG PO TABS
112.0000 ug | ORAL_TABLET | Freq: Every day | ORAL | Status: DC
  Administered 2021-09-04 – 2021-09-05 (×2): 112 ug via ORAL
  Filled 2021-09-04 (×2): qty 1

## 2021-09-04 NOTE — Progress Notes (Addendum)
Bed offers pending.   Adden 2:20pm,    CSW presented bed offers to pt and family, they chose [facility]. CSW to submit auth.  TOC to follow.   Arlie Solomons.Jedidiah Demartini, MSW, [place]  Transitions of Care Clinical Social Worker I Direct Dial: [phone]  Fax: [phone] [email or web address]

## 2021-09-04 NOTE — Discharge Instructions (Addendum)
Katrina Randolph was seen in the emergency room after she had a fall.  She has been having generalized weakness that led to the fall.  She has history of atrial fibrillation not on any blood thinners, chronic anemia and previous strokes along with other medical comorbidities.  While in the emergency room, her work-up revealed that she had a compression fracture of lumbar spine.  Treatment will be a brace along with over-the-counter medicine for pain control.   Lab results here were reassuring.  Urine did not have any clear evidence of infection.  She also has no elevated white count and her kidney function is at baseline normal for her.

## 2021-09-04 NOTE — ED Notes (Signed)
Pt currently denies any pain currently when relaxed. Pt stated she is currently not hungry at this time, requested water, provided pt with pitcher of water. Addressed bowel/urinary needs, pt denies any bowel movement and has purewick in place.

## 2021-09-04 NOTE — Progress Notes (Signed)
Orthopedic Tech Progress Note Patient Details:  Katrina Randolph [birth year] [id number]  Patient ID: Katrina Randolph, female   DOB: 29-Jun-1930, 88 y.o.   MRN: [id number] Brace called order into hanger Karolee Stamps 09/04/2021, 5:56 AM

## 2021-09-04 NOTE — NC FL2 (Signed)
[place] LEVEL OF CARE SCREENING TOOL     IDENTIFICATION  Patient Name: Katrina Randolph Birthdate: Apr 05, 1930 Sex: female Admission Date (Current Location): 09/03/2021  [facility] and [place] Number:  Herbalist and Address:  [facility],  [place], [place]      Provider Number: [id number]  Attending Physician Name and Address:  No att. 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   CVA (cerebral vascular accident) ([place]) 05/30/2020   Orthostatic hypotension 01/26/2020   Essential hypertension 01/26/2020   Acute respiratory failure ([place]) 10/19/2019   Colon polyp 10/19/2019   Orthostatic dizziness 10/19/2019   Hypothyroidism 10/19/2019   Rapid atrial fibrillation ([place]) 10/12/2019   Pneumonia 10/12/2019   Anemia requiring transfusions 10/12/2019   DNR (do not resuscitate) 10/12/2019   Acquired thrombophilia ([place]) 10/12/2019   Iron deficiency anemia 11/24/2017   Paroxysmal atrial fibrillation ([place]) 11/24/2017   Chronic depression 11/24/2017   GERD (gastroesophageal reflux disease) 11/24/2017   Breast cancer ([place]) 11/24/2017   Hypothyroid 11/24/2017   Dyslipidemia 11/24/2017    Orientation RESPIRATION BLADDER Height & Weight     Self, Time, Place, Situation  Normal Continent Weight: 160 lb 15 oz (73  kg) Height:  '5\' 2"'$  (157.5 cm)  BEHAVIORAL SYMPTOMS/MOOD NEUROLOGICAL BOWEL NUTRITION STATUS      Continent Diet (regular)  AMBULATORY STATUS COMMUNICATION OF NEEDS Skin   Limited Assist Verbally Normal                       Personal Care Assistance Level of Assistance  Bathing, Feeding, Dressing Bathing Assistance: Limited assistance Feeding assistance: Independent Dressing Assistance: Limited assistance     Functional Limitations Info  Sight, Hearing, Speech Sight Info: Adequate Hearing Info: Adequate Speech Info: Adequate    SPECIAL CARE FACTORS FREQUENCY  OT (By licensed OT), PT (By licensed PT)     PT Frequency: 5 x a week OT Frequency: 5 x a week            Contractures Contractures Info: Not present    Additional Factors Info  Code Status, Allergies Code Status Info: full Allergies Info: No known allergies           Current Medications (09/04/2021):  This is the current hospital active medication list Current Facility-Administered Medications  Medication Dose Route Frequency Provider Last Rate Last Admin   acetaminophen (TYLENOL) tablet 1,000 mg  1,000 mg Oral Q6H PRN Gareth Morgan, MD       clopidogrel (PLAVIX) tablet 75 mg  75 mg Oral Daily Schlossman, Erin, MD       gabapentin (NEURONTIN) capsule 100 mg  100 mg Oral BID Gareth Morgan, MD   100 mg at 09/04/21 0854   levothyroxine (SYNTHROID) tablet 112 mcg  112 mcg Oral Q0600 Gareth Morgan, MD   112 mcg  at 09/04/21 0543   lidocaine (LIDODERM) 5 % 1 patch  1 patch Transdermal Q24H Gareth Morgan, MD   1 patch at 09/03/21 2223   metoprolol succinate (TOPROL-XL) 24 hr tablet 25 mg  25 mg Oral Daily Gareth Morgan, MD   25 mg at 09/04/21 1028   oxyCODONE (Oxy IR/ROXICODONE) immediate release tablet 5 mg  5 mg Oral Q4H PRN Gareth Morgan, MD       sertraline (ZOLOFT) tablet 50 mg  50 mg Oral Daily Gareth Morgan, MD   50 mg at 09/04/21 1029   Current Outpatient Medications  Medication  Sig Dispense Refill   acetaminophen (TYLENOL) 325 MG tablet Take 2 tablets (650 mg total) by mouth every 4 (four) hours as needed for mild pain (or temp > 37.5 C (99.5 F)). (Patient taking differently: Take 325-650 mg by mouth every 4 (four) hours as needed for mild pain (or temp > 37.5 C (99.5 F)).)     amoxicillin (AMOXIL) 500 MG tablet Take 4 tablets by mouth 1 hour before dental procedures and cleanings 12 tablet 6   clopidogrel (PLAVIX) 75 MG tablet Take 1 tablet (75 mg total) by mouth daily. 90 tablet 1   furosemide (LASIX) 40 MG tablet Take 40 mg by mouth daily as needed for fluid or edema.     gabapentin (NEURONTIN) 100 MG capsule Take 100 mg by mouth 2 (two) times daily.     Glycerin-Hypromellose-PEG 400 (ARTIFICIAL TEARS) 0.2-0.2-1 % SOLN Place 1 drop into both eyes daily as needed (tears).     levothyroxine (SYNTHROID) 112 MCG tablet Take 1 tablet (112 mcg total) by mouth every morning. 30 tablet 0   metoprolol succinate (TOPROL XL) 25 MG 24 hr tablet Take 1 tablet (25 mg total) by mouth daily. 90 tablet 3   sertraline (ZOLOFT) 50 MG tablet Take 1 tablet (50 mg total) by mouth daily. 30 tablet 0     Discharge Medications: Please see discharge summary for a list of discharge medications.  Relevant Imaging Results:  Relevant Lab Results:   Additional Information SSN: [id number]  Marty, LCSW

## 2021-09-04 NOTE — Progress Notes (Signed)
Pt was approved, Pt will be able to discharge in the AM to the facility.

## 2021-09-04 NOTE — ED Notes (Signed)
Pt currently has no bowel movement/urine present in brief.

## 2021-09-04 NOTE — Evaluation (Signed)
Physical Therapy Evaluation Patient Details Name: Katrina Randolph MRN: [id number] DOB: 1930/06/19 Today's Date: 09/04/2021  History of Present Illness  88 yo female , admitted after fall, back pain, sustained L1 compression fracture. PMH: afib, S/P Watchman procedure.CVA, HTN, anemia  Clinical Impression  The  patient  resting  in bed, patient able to mobilize to sitting with max support. Assisted placing TLSO while in sitting. Patient ambulated x15' using Rw and min assistance.  Max  assistance required for sit to sidelying. Patient reported dizziness when ambulating. BP  120/74. SPo2 on RA 94%.  Patient lives alone, has been independent and driving. Pt admitted with above diagnosis.  Pt currently with functional limitations due to the deficits listed below (see PT Problem List). Pt will benefit from skilled PT to increase their independence and safety with mobility to allow discharge to the venue listed below.           Recommendations for follow up therapy are one component of a multi-disciplinary discharge planning process, led by the attending physician.  Recommendations may be updated based on patient status, additional functional criteria and insurance authorization.  Follow Up Recommendations Skilled nursing-short term rehab (<3 hours/day) Can patient physically be transported by private vehicle: Yes    Assistance Recommended at Discharge Frequent or constant Supervision/Assistance  Patient can return home with the following  A little help with walking and/or transfers;A little help with bathing/dressing/bathroom;Help with stairs or ramp for entrance;Assistance with cooking/housework;Assist for transportation    Equipment Recommendations None recommended by PT  Recommendations for Other Services       Functional Status Assessment Patient has had a recent decline in their functional status and demonstrates the ability to make significant improvements in function in a reasonable  and predictable amount of time.     Precautions / Restrictions Precautions Precautions: Fall Required Braces or Orthoses: Spinal Brace Spinal Brace: Thoracolumbosacral orthotic;Applied in sitting position      Mobility  Bed Mobility Overal bed mobility: Needs Assistance Bed Mobility: Rolling, Sidelying to Sit, Sit to Sidelying Rolling: Min assist Sidelying to sit: Max assist     Sit to sidelying: Max assist, +2 for safety/equipment, +2 for physical assistance General bed mobility comments: able to roll, assist with trunk to sit upright, assist with legs and trunk to return to sude, +2 required    Transfers Overall transfer level: Needs assistance Equipment used: Rolling walker (2 wheels) Transfers: Sit to/from Stand Sit to Stand: Min assist           General transfer comment: cues for safety    Ambulation/Gait Ambulation/Gait assistance: Min assist Gait Distance (Feet): 15 Feet Assistive device: Rolling walker (2 wheels) Gait Pattern/deviations: Step-through pattern, Step-to pattern, Decreased stride length Gait velocity: decr     General Gait Details: gait slow, guaurded with RW  Stairs            Wheelchair Mobility    Modified Rankin (Stroke Patients Only)       Balance Overall balance assessment: Needs assistance, History of Falls Sitting-balance support: Feet supported, Bilateral upper extremity supported Sitting balance-Leahy Scale: Poor Sitting balance - Comments: tends to lean posterior, guarding back   Standing balance support: Bilateral upper extremity supported, During functional activity, Reliant on assistive device for balance Standing balance-Leahy Scale: Poor                               Pertinent Vitals/Pain Pain Assessment Pain  Assessment: Faces Faces Pain Scale: Hurts whole lot Pain Location: back and right  thigh Pain Descriptors / Indicators: Aching, Discomfort Pain Intervention(s): Monitored during session,  Limited activity within patient's tolerance    Home Living Family/patient expects to be discharged to:: Skilled nursing facility Living Arrangements: Alone Available Help at Discharge: Family;Available PRN/intermittently Type of Home: House Home Access: Stairs to enter Entrance Stairs-Rails: None Entrance Stairs-Number of Steps: 1   Home Layout: One level Home Equipment: Rollator (4 wheels);Shower seat - built in Additional Comments: daughter lives 2 mins away    Prior Function Prior Level of Function : Independent/Modified Independent             Mobility Comments: still drives       Hand Dominance   Dominant Hand: Right    Extremity/Trunk Assessment        Lower Extremity Assessment Lower Extremity Assessment: Generalized weakness (able  ambulate ,no focal deficits noted)    Cervical / Trunk Assessment Cervical / Trunk Assessment: Other exceptions Cervical / Trunk Exceptions: back pain sititng , getting TLSO placed  Communication   Communication: Expressive difficulties  Cognition Arousal/Alertness: Awake/alert Behavior During Therapy: WFL for tasks assessed/performed Overall Cognitive Status: Within Functional Limits for tasks assessed                                          General Comments      Exercises     Assessment/Plan    PT Assessment Patient needs continued PT services  PT Problem List Decreased strength;Decreased mobility;Decreased safety awareness;Decreased range of motion;Decreased knowledge of precautions;Decreased activity tolerance;Decreased balance;Decreased knowledge of use of DME;Pain       PT Treatment Interventions DME instruction;Therapeutic activities;Gait training;Therapeutic exercise;Patient/family education;Functional mobility training    PT Goals (Current goals can be found in the Care Plan section)  Acute Rehab PT Goals Patient Stated Goal: i'll need rehab PT Goal Formulation: With patient Time For  Goal Achievement: 09/18/21 Potential to Achieve Goals: Fair    Frequency Min 2X/week     Co-evaluation PT/OT/SLP Co-Evaluation/Treatment: Yes Reason for Co-Treatment: For patient/therapist safety;To address functional/ADL transfers PT goals addressed during session: Mobility/safety with mobility OT goals addressed during session: ADL's and self-care       AM-PAC PT "6 Clicks" Mobility  Outcome Measure Help needed turning from your back to your side while in a flat bed without using bedrails?: A Lot Help needed moving from lying on your back to sitting on the side of a flat bed without using bedrails?: Total Help needed moving to and from a bed to a chair (including a wheelchair)?: Total Help needed standing up from a chair using your arms (e.g., wheelchair or bedside chair)?: A Lot Help needed to walk in hospital room?: A Little Help needed climbing 3-5 steps with a railing? : Total 6 Click Score: 10    End of Session Equipment Utilized During Treatment: Gait belt Activity Tolerance: Patient limited by pain Patient left: in bed;with call bell/phone within reach Nurse Communication: Mobility status PT Visit Diagnosis: Unsteadiness on feet (R26.81);Pain;Repeated falls (R29.6) Pain - Right/Left: Right Pain - part of body: Leg    Time: 1030-1054 PT Time Calculation (min) (ACUTE ONLY): 24 min   Charges:   PT Evaluation $PT Eval Low Complexity: [place] Office [phone] Weekend [phone]  Claretha Cooper 09/04/2021, 12:03 PM

## 2021-09-04 NOTE — ED Notes (Signed)
PT at bedside.

## 2021-09-04 NOTE — ED Provider Notes (Signed)
Emergency Medicine Observation Re-evaluation Note  Katrina Randolph is a 88 y.o. female, seen on rounds today.  Pt initially presented to the ED for complaints of Weakness (fa) and Fall Currently, the patient is comfortable.  Physical Exam  BP 120/84   Pulse 80   Temp (!) 97.2 F (36.2 C) (Oral)   Resp 11   Ht '5\' 2"'$  (1.575 m)   Wt 73 kg   SpO2 96%   BMI 29.44 kg/m  Physical Exam General: No acute distress Cardiac: Regular rate and rhythm Lungs: No respiratory distress Psych: Cooperative, calm  ED Course / MDM  EKG:   I have reviewed the labs performed to date as well as medications administered while in observation.  Recent changes in the last 24 hours include no new changes.  Patient had come into the ER after a fall.  She was found to have an L1 fracture.  Social work has been consulted for placement.  Plan  Current plan is for waiting for social work recommendations.  YAZMINE SOREY is not under involuntary commitment.     Varney Biles, MD 09/04/21 1141

## 2021-09-04 NOTE — ED Notes (Signed)
Ortho tech notified of TLSO brace.

## 2021-09-04 NOTE — Progress Notes (Signed)
CSW started [facility] awaiting approval etc.

## 2021-09-04 NOTE — Evaluation (Signed)
Occupational Therapy Evaluation Patient Details Name: Katrina Randolph MRN: [id number] DOB: February 18, 1930 Today's Date: 09/04/2021   History of Present Illness 88 yo female , admitted after fall, back pain, sustained L1 compression fracture. PMH: afib, S/P Watchman procedure.CVA, HTN, anemia   Clinical Impression   Patient is a 88 year old female who lives at home alone prior level with new onset of back pain and TLSO brace. Patient is TD for brace management with increased pain impacting education on this date sitting EOB. Patient was min A with RW for transfers with onset of dizziness after session noted. Patient was noted to have decreased functional activity tolerance, decreased endurance, decreased standing balance, decreased safety awareness, and decreased knowledge of AD/AE impacting participation in ADLs. Patient would continue to benefit from skilled OT services at this time while admitted and after d/c to address noted deficits in order to improve overall safety and independence in ADLs.        Recommendations for follow up therapy are one component of a multi-disciplinary discharge planning process, led by the attending physician.  Recommendations may be updated based on patient status, additional functional criteria and insurance authorization.   Follow Up Recommendations  Skilled nursing-short term rehab (<3 hours/day)    Assistance Recommended at Discharge Frequent or constant Supervision/Assistance  Patient can return home with the following A lot of help with bathing/dressing/bathroom;A little help with walking and/or transfers;Direct supervision/assist for medications management;Direct supervision/assist for financial management;Assist for transportation;Assistance with cooking/housework;Help with stairs or ramp for entrance    Functional Status Assessment  Patient has had a recent decline in their functional status and demonstrates the ability to make significant improvements  in function in a reasonable and predictable amount of time.  Equipment Recommendations       Recommendations for Other Services       Precautions / Restrictions Precautions Precautions: Fall Precaution Comments: back precautions Required Braces or Orthoses: Spinal Brace Spinal Brace: Thoracolumbosacral orthotic;Applied in sitting position      Mobility Bed Mobility Overal bed mobility: Needs Assistance Bed Mobility: Rolling, Sidelying to Sit, Sit to Sidelying Rolling: Min assist Sidelying to sit: Max assist     Sit to sidelying: Max assist, +2 for safety/equipment, +2 for physical assistance General bed mobility comments: able to roll, assist with trunk to sit upright, assist with legs and trunk to return to sude, +2 required    Transfers                          Balance Overall balance assessment: Needs assistance, History of Falls Sitting-balance support: Feet supported, Bilateral upper extremity supported Sitting balance-Leahy Scale: Poor Sitting balance - Comments: tends to lean posterior, guarding back   Standing balance support: Bilateral upper extremity supported, During functional activity, Reliant on assistive device for balance Standing balance-Leahy Scale: Poor                             ADL either performed or assessed with clinical judgement   ADL Overall ADL's : Needs assistance/impaired Eating/Feeding: Set up;Sitting   Grooming: Minimal assistance;Sitting Grooming Details (indicate cue type and reason): EOB Upper Body Bathing: Moderate assistance;Bed level   Lower Body Bathing: Bed level;Maximal assistance   Upper Body Dressing : Sitting;Total assistance Upper Body Dressing Details (indicate cue type and reason): patient was educated on how to don/doff TLSO with patient needing hand over hand at times to find  straps and limited in following cues due to pain levels. Lower Body Dressing: Bed level;Maximal assistance   Toilet  Transfer: Rolling walker (2 wheels);Minimal assistance Toilet Transfer Details (indicate cue type and reason): for transfer from edge of bed to take some functional steps to opposite side of bed in ED with RW with increased time. Toileting- Clothing Manipulation and Hygiene: Maximal assistance;Sit to/from stand Toileting - Clothing Manipulation Details (indicate cue type and reason): unable to take hands off walker and maintain standing balance.             Vision Patient Visual Report: No change from baseline       Perception     Praxis      Pertinent Vitals/Pain Pain Assessment Pain Assessment: Faces Faces Pain Scale: Hurts whole lot Pain Location: back and right  thigh Pain Descriptors / Indicators: Aching, Discomfort Pain Intervention(s): Limited activity within patient's tolerance, Monitored during session     Hand Dominance Right   Extremity/Trunk Assessment Upper Extremity Assessment Upper Extremity Assessment: Overall WFL for tasks assessed   Lower Extremity Assessment Lower Extremity Assessment: Defer to PT evaluation   Cervical / Trunk Assessment Cervical / Trunk Assessment: Other exceptions Cervical / Trunk Exceptions: back pain sititng , getting TLSO placed   Communication Communication Communication: Expressive difficulties   Cognition Arousal/Alertness: Awake/alert Behavior During Therapy: WFL for tasks assessed/performed Overall Cognitive Status: Within Functional Limits for tasks assessed                                       General Comments       Exercises     Shoulder Instructions      Home Living Family/patient expects to be discharged to:: Skilled nursing facility Living Arrangements: Alone Available Help at Discharge: Family;Available PRN/intermittently Type of Home: House Home Access: Stairs to enter Entrance Stairs-Number of Steps: 1 Entrance Stairs-Rails: None Home Layout: One level     Bathroom Shower/Tub:  Occupational psychologist: Handicapped height     Home Equipment: Rollator (4 wheels);Shower seat - built in   Additional Comments: daughter lives 2 mins away      Prior Functioning/Environment Prior Level of Function : Independent/Modified Independent             Mobility Comments: still drives          OT Problem List: Decreased activity tolerance;Impaired balance (sitting and/or standing);Impaired vision/perception;Decreased knowledge of precautions;Decreased safety awareness;Decreased knowledge of use of DME or AE;Pain      OT Treatment/Interventions: Self-care/ADL training;Therapeutic exercise;Neuromuscular education;Energy conservation;DME and/or AE instruction;Patient/family education;Balance training;Therapeutic activities    OT Goals(Current goals can be found in the care plan section) Acute Rehab OT Goals Patient Stated Goal: to get better OT Goal Formulation: With patient Time For Goal Achievement: 09/18/21 Potential to Achieve Goals: Fair  OT Frequency: Min 2X/week    Co-evaluation   Reason for Co-Treatment: For patient/therapist safety;To address functional/ADL transfers PT goals addressed during session: Mobility/safety with mobility OT goals addressed during session: ADL's and self-care      AM-PAC OT "6 Clicks" Daily Activity     Outcome Measure Help from another person eating meals?: A Little Help from another person taking care of personal grooming?: A Little Help from another person toileting, which includes using toliet, bedpan, or urinal?: A Lot Help from another person bathing (including washing, rinsing, drying)?: A Lot Help from another person  to put on and taking off regular upper body clothing?: A Lot Help from another person to put on and taking off regular lower body clothing?: A Lot 6 Click Score: 14   End of Session Equipment Utilized During Treatment: Rolling walker (2 wheels);Gait belt;Other (comment) (TLSO) Nurse  Communication: Other (comment) (ok to participate in session. updated on participation after session)  Activity Tolerance: Patient tolerated treatment well Patient left: in bed;with call bell/phone within reach  OT Visit Diagnosis: Unsteadiness on feet (R26.81);Other abnormalities of gait and mobility (R26.89);Pain;Muscle weakness (generalized) (M62.81)                Time: 0786-7544 OT Time Calculation (min): 21 min Charges:  OT General Charges $OT Visit: 1 Visit OT Evaluation $OT Eval Moderate Complexity: 1 Mod  Katrina Randolph OTR/L, MS Acute Rehabilitation Department Office# [phone] Pager# [phone]   Marcellina Millin 09/04/2021, 2:41 PM

## 2021-09-04 NOTE — Progress Notes (Signed)
.  Transition of Care [facility]) - Emergency Department Mini Assessment   Patient Details  Name: Katrina Randolph MRN: [id number] Date of Birth: 05/02/1930  Transition of [facility]) CM/SW Contact:    Illene Regulus, LCSW Phone Number: 09/04/2021, 12:25 PM   Clinical Narrative:  TOC CSW spoke with pt to discuss PT recommendations for short-term rehab, pt has agreed to placement. pt requested CSW to speak with her daughter Santiago Glad about the recommendations. CSW spoke with Freda Jaquith [phone]) she reported pt has had SNF placement before she moved here to [place] 4 years ago. Pt's daughter is requesting pt's information be sent out to [facility]. CSW explained the placement process through the ER.CSW to send pt information out for placement. TOC to follow.   ED Mini Assessment: What brought you to the Emergency Department? : fall  Barriers to Discharge: SNF Pending bed offer        Interventions which prevented an admission or readmission: SNF Placement    Patient Contact and Communications        ,            CMS Medicare.gov Compare Post Acute Care list provided to:: Patient    Admission diagnosis:  Fall, weakness Patient Active Problem List   Diagnosis Date Noted   Presence of Watchman left atrial appendage closure device 05/24/2021   Atrial fibrillation with rapid ventricular response ([place]) 08/31/2020   Neuropathic pain    Dysesthesia    Prediabetes    Expressive aphasia    Atrial fibrillation ([place])    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   CVA (cerebral vascular accident) ([place]) 05/30/2020   Orthostatic hypotension 01/26/2020   Essential hypertension 01/26/2020   Acute respiratory failure ([place]) 10/19/2019   Colon polyp 10/19/2019   Orthostatic dizziness 10/19/2019   Hypothyroidism 10/19/2019   Rapid atrial fibrillation ([place]) 10/12/2019   Pneumonia 10/12/2019   Anemia requiring transfusions 10/12/2019   DNR (do not  resuscitate) 10/12/2019   Acquired thrombophilia ([place]) 10/12/2019   Iron deficiency anemia 11/24/2017   Paroxysmal atrial fibrillation ([place]) 11/24/2017   Chronic depression 11/24/2017   GERD (gastroesophageal reflux disease) 11/24/2017   Breast cancer ([place]) 11/24/2017   Hypothyroid 11/24/2017   Dyslipidemia 11/24/2017   PCP:  [name], PA-C Pharmacy:   [id number] [place] [address]Phone: [phone]: [phone]

## 2021-09-05 NOTE — Progress Notes (Signed)
Pt to d/c to [facility] room 513 call report [phone]. MD and RN made aware.  Arlie Solomons.Endrit Gittins, MSW, [place]  Transitions of Care Clinical Social Worker I Direct Dial: [phone]  Fax: [phone] [email or web address]

## 2021-09-05 NOTE — ED Notes (Signed)
[facility] call to transport the patient to [place].

## 2021-09-05 NOTE — ED Notes (Signed)
Report given to [facility].

## 2021-09-17 ENCOUNTER — Other Ambulatory Visit: Payer: Self-pay | Admitting: Adult Medicine

## 2021-09-17 ENCOUNTER — Other Ambulatory Visit (HOSPITAL_COMMUNITY): Payer: Self-pay | Admitting: Adult Medicine

## 2021-09-17 DIAGNOSIS — K37 Unspecified appendicitis: Secondary | ICD-10-CM

## 2021-09-26 ENCOUNTER — Encounter (HOSPITAL_COMMUNITY): Payer: Self-pay

## 2021-09-26 ENCOUNTER — Ambulatory Visit (HOSPITAL_COMMUNITY)
Admission: RE | Admit: 2021-09-26 | Discharge: 2021-09-26 | Disposition: A | Discharge status: Home / Self Care | Payer: Medicare Other | Source: Ambulatory Visit | Attending: Adult Medicine | Admitting: Adult Medicine

## 2021-09-26 DIAGNOSIS — K37 Unspecified appendicitis: Secondary | ICD-10-CM

## 2021-09-26 MED ORDER — SODIUM CHLORIDE (PF) 0.9 % IJ SOLN
INTRAMUSCULAR | Status: AC
  Filled 2021-09-26: qty 50

## 2021-09-26 MED ORDER — IOHEXOL 300 MG/ML  SOLN
100.0000 mL | Freq: Once | INTRAMUSCULAR | Status: AC | PRN
  Administered 2021-09-26: 100 mL via INTRAVENOUS

## 2021-10-01 ENCOUNTER — Other Ambulatory Visit: Payer: Self-pay | Admitting: Family Medicine

## 2021-10-01 ENCOUNTER — Ambulatory Visit
Admission: RE | Admit: 2021-10-01 | Discharge: 2021-10-01 | Disposition: A | Discharge status: Home / Self Care | Payer: Medicare Other | Source: Ambulatory Visit | Attending: Family Medicine | Admitting: Family Medicine

## 2021-10-01 DIAGNOSIS — R52 Pain, unspecified: Secondary | ICD-10-CM

## 2021-10-02 ENCOUNTER — Other Ambulatory Visit (HOSPITAL_COMMUNITY): Payer: Self-pay | Admitting: Adult Medicine

## 2021-10-02 DIAGNOSIS — K37 Unspecified appendicitis: Secondary | ICD-10-CM

## 2021-11-15 NOTE — Progress Notes (Unsigned)
[place]                                     Cardiology Office Note:    Date:  11/19/2021   ID:  MARRISA KIMBER, DOB 1930-03-29, MRN [id number]  PCP:  [name], PA-C  [facility] Cardiologist:  [name], MD  [facility] Electrophysiologist:  [name], MD   Referring MD: [name], PA-C   Chief Complaint  Patient presents with   Follow-up    6 month s/p LAAO    History of Present Illness:    Katrina Randolph is a 88 y.o. female with a hx of dementia, HTN, ischemic CVA, chronic atrial fibrillation, recurrent GI bleeding s/p LAAO with Watchman on 05/24/21 who presents to clinic for follow up.    She was admitted to the hospital 10/12/2019-10/20/2019 with atrial fibrillation with RVR found to have PNA, anemia, acute thrombocytopenia, and orthostatic dizziness. Her hemoglobin was 7.7 with reportedly dark colored stools. On GI evaluation with colonoscopy and EGD, there was no clear source identified although she has a hiatal hernia and Cameron's erosions with small polyps and diverticulosis.    On follow up with [name], she was ultimately referred to [name] for consideration of Watchman implant however she deferred at the time of her evaluation. Unfortunately she was admitted later with an ischemic stroke. She was later started on Pradaxa.    In follow up with [name] 04/20/21 she reportedly took herself off the Pradaxa out of fear of anticoagulation therapies. She underwent successful LAAO with a Watchman FLX 109m by Dr. [name] 05/24/21. She was continued on Pradaxa 150 mg BID with plans to transition of Plavix monotherapy if 45 day TEE requirements were met to complete 6 months of total post implant therapy and then transition to aspirin alone.    When called for follow up, she complained of general weakness and shortness of breath and was added onto my schedule for evaluation on  06/01/21. She had evidence of volume overload and BNP>4000. Her Lasix was increased to 435mdaily. Creat 0.90, K 4.3, Hg 10.7. CXR with no acute abnormality. On follow up, she has had a 10lb weight loss and an improvement in lower extremity edema, abdominal distention and shortness of breath.    Today she is here alone and states that she is doing ok with no SOB or edema but reports some dizziness with standing at times. This is not new and reports this has occurred for years with no real change. She continues to tolerate Pradaxa with no bleeding in stool or urine. Plan for post watchman TEE 07/11/21 with Dr. O'Audie BoxWe reviewed TEE instructions with patient understanding. She denies chest pain, palpitations, LE edema, orthopnea, or syncope.    Chronic atrial fibrillation: s/p LAAO with Watchman FLX 2753mContinue Pradaxa. Plan for TEE 07/11/21 to assess device seal. If no leak or thrombus plan to transition to Plavix monotherapy if 45 day TEE requirements were met to complete 6 months of total post implant therapy and then transition to aspirin alone. Will need SBE with Amoxicillin for 6 month duration (11/23/21).    After careful review of history and examination, the risks and benefits of transesophageal echocardiogram have been explained including risks of esophageal damage, perforation (1:10,000 risk), bleeding, pharyngeal hematoma as well as other potential complications associated with  conscious sedation including aspiration, arrhythmia, respiratory failure and death. Alternatives to treatment were discussed, questions were answered. Patient is willing to proceed.    Chronic diastolic CHF: She is up 5lb since 05/2021 however appears euvolemic on exam. at the last visit I increased her Lasix from 20 mg to 40 mg daily. Continue current Lasix regimen.     Hx of GI bleeding with anemia: Last Hb 11.6. No c/o of bleeding in stool or urine.    Past Medical History:  Diagnosis Date   Atrial fibrillation  ([place])    Presence of Watchman left atrial appendage closure device 05/24/2021   Watchman FLX 29m with Dr. LQuentin Ore   Past Surgical History:  Procedure Laterality Date   ABDOMINAL HYSTERECTOMY     BIOPSY  10/19/2019   Procedure: BIOPSY;  Surgeon: BOtis Brace MD;  Location: [facility];  Service: Gastroenterology;;   BREAST LUMPECTOMY     CHOLECYSTECTOMY     COLONOSCOPY WITH PROPOFOL N/A 10/19/2019   Procedure: COLONOSCOPY WITH PROPOFOL;  Surgeon: BOtis Brace MD;  Location: [facility];  Service: Gastroenterology;  Laterality: N/A;   ESOPHAGOGASTRODUODENOSCOPY (EGD) WITH PROPOFOL N/A 10/19/2019   Procedure: ESOPHAGOGASTRODUODENOSCOPY (EGD) WITH PROPOFOL;  Surgeon: BOtis Brace MD;  Location: [facility];  Service: Gastroenterology;  Laterality: N/A;   LEFT ATRIAL APPENDAGE OCCLUSION N/A 05/24/2021   Procedure: LEFT ATRIAL APPENDAGE OCCLUSION;  Surgeon: LVickie Epley MD;  [facility];  Service: Cardiovascular;  Laterality: N/A;   POLYPECTOMY  10/19/2019   Procedure: POLYPECTOMY;  Surgeon: BOtis Brace MD;  [facility]: [facility];  Service: Gastroenterology;;   TEE WITHOUT CARDIOVERSION N/A 05/24/2021   Procedure: TRANSESOPHAGEAL ECHOCARDIOGRAM (TEE);  Surgeon: LVickie Epley MD;  [facility];  Service: Cardiovascular;  Laterality: N/A;   TEE WITHOUT CARDIOVERSION N/A 07/11/2021   Procedure: TRANSESOPHAGEAL ECHOCARDIOGRAM (TEE);  Surgeon: OGeralynn Rile MD;  [facility] [facility]  Service: Cardiovascular;  Laterality: N/A;    Current Medications: No outpatient medications have been marked as taking for the 11/19/21 encounter (Office Visit) with [facility].     Allergies:   Patient has no known allergies.   Social History   Socioeconomic History   Marital status: Widowed    Spouse name: Not on file   Number of children: Not on file   Years of education: Not on file   Highest education  level: Not on file  Occupational History   Not on file  Tobacco Use   Smoking status: Former    Types: Cigarettes    Quit date: 08/31/1980    Years since quitting: 41.2   Smokeless tobacco: Never   Tobacco comments:    quit over 20 Years ago  Vaping Use   Vaping Use: Never used  Substance and Sexual Activity   Alcohol use: Yes    Comment: occasionally   Drug use: Never   Sexual activity: Not Currently  Other Topics Concern   Not on file  Social History Narrative   Lives alone   Right Handed    Drinks 2 cups caffeine daily   Social Determinants of Health   Financial Resource Strain: Not on file  Food Insecurity: Not on file  Transportation Needs: Not on file  Physical Activity: Not on file  Stress: Not on file  Social Connections: Not on file     Family History: The patient's ***family history is not on file.  ROS:   Please see the history of present illness.  All other systems reviewed and are negative.  EKGs/Labs/Other Studies Reviewed:    The following studies were reviewed today:  05/24/21 LAAO procedure CONCLUSIONS:  1.Successful implantation of a WATCHMAN left atrial appendage occlusive device    2. TEE demonstrating no LAA thrombus 3. No early apparent complications.    Post Implant Anticoagulation Strategy: Continue Pradaxa 150 mg by mouth twice daily for 45 days post implant.  If 45-day transesophageal echo criteria are met, stop Pradaxa and start Plavix 75 mg by mouth once daily to complete 6 months of total post implant therapy.     EKG:  EKG is *** ordered today.  The ekg ordered today demonstrates ***  Recent Labs: 06/01/2021: NT-Pro BNP 4,150 09/03/2021: ALT 13; BUN 19; Creatinine, Ser 0.92; Hemoglobin 12.2; Platelets 165; Potassium 3.8; Sodium 144  Recent Lipid Panel    Component Value Date/Time   CHOL 134 09/01/2020 0434   TRIG 65 09/01/2020 0434   HDL 48 09/01/2020 0434   CHOLHDL 2.8 09/01/2020 0434   VLDL 13 09/01/2020 0434    LDLCALC 73 09/01/2020 0434     Risk Assessment/Calculations:   {Does this patient have ATRIAL FIBRILLATION?:[phone]}   Physical Exam:    VS:  There were no vitals taken for this visit.    Wt Readings from Last 3 Encounters:  09/03/21 160 lb 15 oz (73 kg)  08/20/21 161 lb 9.6 oz (73.3 kg)  07/11/21 162 lb (73.5 kg)     GEN: *** Well nourished, well developed in no acute distress HEENT: Normal NECK: No JVD; No carotid bruits LYMPHATICS: No lymphadenopathy CARDIAC: ***RRR, no murmurs, rubs, gallops RESPIRATORY:  Clear to auscultation without rales, wheezing or rhonchi  ABDOMEN: Soft, non-tender, non-distended MUSCULOSKELETAL:  No edema; No deformity  SKIN: Warm and dry NEUROLOGIC:  Alert and oriented x 3 PSYCHIATRIC:  Normal affect   ASSESSMENT:    No diagnosis found. PLAN:    In order of problems listed above:       {Are you ordering a CV Procedure (e.g. stress test, cath, DCCV, TEE, etc)?   Press F2        :[id number]}    Medication Adjustments/Labs and Tests Ordered: Current medicines are reviewed at length with the patient today.  Concerns regarding medicines are outlined above.  No orders of the defined types were placed in this encounter.  No orders of the defined types were placed in this encounter.   There are no Patient Instructions on file for this visit.   Signed, Kathyrn Drown, NP  11/19/2021 12:47 PM    [facility]

## 2021-11-19 ENCOUNTER — Ambulatory Visit: Payer: Medicare Other | Attending: Cardiology | Admitting: Cardiology

## 2021-11-19 VITALS — BP 124/70 | HR 82 | Ht 62.0 in | Wt 153.2 lb

## 2021-11-19 DIAGNOSIS — I63412 Cerebral infarction due to embolism of left middle cerebral artery: Secondary | ICD-10-CM | POA: Diagnosis not present

## 2021-11-19 DIAGNOSIS — I4891 Unspecified atrial fibrillation: Secondary | ICD-10-CM

## 2021-11-19 DIAGNOSIS — I10 Essential (primary) hypertension: Secondary | ICD-10-CM | POA: Diagnosis not present

## 2021-11-19 DIAGNOSIS — D649 Anemia, unspecified: Secondary | ICD-10-CM

## 2021-11-19 DIAGNOSIS — Z95818 Presence of other cardiac implants and grafts: Secondary | ICD-10-CM | POA: Diagnosis not present

## 2021-11-19 NOTE — Patient Instructions (Signed)
Medication Instructions:  1.We have removed plavix and amoxicillin from your medication list. *If you need a refill on your cardiac medications before your next appointment, please call your pharmacy*   Lab Work: None If you have labs (blood work) drawn today and your tests are completely normal, you will receive your results only by: [place] (if you have MyChart) OR A paper copy in the mail If you have any lab test that is abnormal or we need to change your treatment, we will call you to review the results.   Follow-Up: At [facility], you and your health needs are our priority.  As part of our continuing mission to provide you with exceptional heart care, we have created designated Provider Care Teams.  These Care Teams include your primary Cardiologist (physician) and Advanced Practice Providers (APPs -  Physician Assistants and Nurse Practitioners) who all work together to provide you with the care you need, when you need it.   Your next appointment:   July 2024  The format for your next appointment:   In Person  Provider:   [name], MD     Important Information About [place]

## 2021-12-18 ENCOUNTER — Telehealth: Payer: Self-pay | Admitting: Cardiology

## 2021-12-18 ENCOUNTER — Encounter: Payer: Self-pay | Admitting: Pulmonary Disease

## 2021-12-18 ENCOUNTER — Ambulatory Visit (INDEPENDENT_AMBULATORY_CARE_PROVIDER_SITE_OTHER): Payer: Medicare Other | Admitting: Pulmonary Disease

## 2021-12-18 VITALS — BP 100/70 | HR 63 | Ht 62.0 in | Wt 148.0 lb

## 2021-12-18 DIAGNOSIS — R0602 Shortness of breath: Secondary | ICD-10-CM | POA: Diagnosis not present

## 2021-12-18 DIAGNOSIS — M25473 Effusion, unspecified ankle: Secondary | ICD-10-CM | POA: Diagnosis not present

## 2021-12-18 DIAGNOSIS — I482 Chronic atrial fibrillation, unspecified: Secondary | ICD-10-CM

## 2021-12-18 LAB — CBC WITH DIFFERENTIAL/PLATELET
Basophils Absolute: 0.1 10*3/uL (ref 0.0–0.1)
Basophils Relative: 0.7 % (ref 0.0–3.0)
Eosinophils Absolute: 0.3 10*3/uL (ref 0.0–0.7)
Eosinophils Relative: 3.2 % (ref 0.0–5.0)
HCT: 34.6 % — ABNORMAL LOW (ref 36.0–46.0)
Hemoglobin: 11.1 g/dL — ABNORMAL LOW (ref 12.0–15.0)
Lymphocytes Relative: 5.9 % — ABNORMAL LOW (ref 12.0–46.0)
Lymphs Abs: 0.6 10*3/uL — ABNORMAL LOW (ref 0.7–4.0)
MCHC: 32.1 g/dL (ref 30.0–36.0)
MCV: 85.2 fl (ref 78.0–100.0)
Monocytes Absolute: 0.8 10*3/uL (ref 0.1–1.0)
Monocytes Relative: 8.3 % (ref 3.0–12.0)
Neutro Abs: 7.7 10*3/uL (ref 1.4–7.7)
Neutrophils Relative %: 81.9 % — ABNORMAL HIGH (ref 43.0–77.0)
Platelets: 215 10*3/uL (ref 150.0–400.0)
RBC: 4.06 Mil/uL (ref 3.87–5.11)
RDW: 17.5 % — ABNORMAL HIGH (ref 11.5–15.5)
WBC: 9.4 10*3/uL (ref 4.0–10.5)

## 2021-12-18 LAB — BRAIN NATRIURETIC PEPTIDE: Pro B Natriuretic peptide (BNP): 263 pg/mL — ABNORMAL HIGH (ref 0.0–100.0)

## 2021-12-18 NOTE — Patient Instructions (Addendum)
Shortness of breath: As we discussed today there is many possible causes for this CBC proBNP Full pulmonary function test High-resolution CT scan of the chest  Atrial fibrillation and diastolic heart failure: Keep taking medications as prescribed by the cardiology clinic including Lasix for ankle swelling  Physical deconditioning: I think it is a good idea to try to exercise on a regular basis.  We will bring you back in 3-4 weeks to go over the results of the testing so that we can have a better understanding of why you are experiencing the shortness of breath.

## 2021-12-18 NOTE — Progress Notes (Signed)
Synopsis: Referred in November 2023 for shortness of breath and cough.  Has a history significant for ischemic stroke, status post Watchman procedure April 2023.  Subjective:   PATIENT ID: Katrina Randolph GENDER: female DOB: [birth year], MRN: [id number]   HPI  Chief Complaint  Patient presents with   Consult    SOB, little cough.    [name] says that she has a hard time breathing every day > she has been feeling like she isn't getting enough oxygen > it's been going on for 2.5 weeks > she has been tested for pneumonia, COVID and other problems > it's been more intense in the last 2.5 weeks, but she has had problems breathing for a while > she's never been told she had a lung problme > never had childhood lung problems > quit smoking around age 88.  Smoked 2-3 cigarettes a day, smoked about 50 years.   > she always worked in a clerical environment  Cough: > "little bit" > doesn't cough up mucus but she can feel some chest congestion > never really coughs up anything  She had pneumonia 2 years ago.  She was hospitalized at [facility].  She's had ankle swelling > not new, it's been worse this year   Record review: October 2023 cardiology records reviewed where the patient was seen in the context of many events in 2023.  Specifically, she was noted to have atrial fibrillation, had been referred for a Watchman procedure but deferred.  Ended up having an ischemic stroke.  By April 2023 she had the Watchman procedure performed.  Antiplatelet and anticoagulant therapy was stopped after the watchman.  Past Medical History:  Diagnosis Date   Atrial fibrillation ([place])    Presence of Watchman left atrial appendage closure device 05/24/2021   Watchman FLX 19m with Dr. LQuentin Ore    No family history on file.   Social History   Socioeconomic History   Marital status: Widowed    Spouse name: Not on file   Number of children: Not on file   Years of education: Not on file   Highest  education level: Not on file  Occupational History   Not on file  Tobacco Use   Smoking status: Former    Types: Cigarettes    Quit date: 08/31/1980    Years since quitting: 41.3   Smokeless tobacco: Never   Tobacco comments:    quit over 20 Years ago  Vaping Use   Vaping Use: Never used  Substance and Sexual Activity   Alcohol use: Yes    Comment: occasionally   Drug use: Never   Sexual activity: Not Currently  Other Topics Concern   Not on file  Social History Narrative   Lives alone   Right Handed    Drinks 2 cups caffeine daily   Social Determinants of Health   Financial Resource Strain: Not on file  Food Insecurity: Not on file  Transportation Needs: Not on file  Physical Activity: Not on file  Stress: Not on file  Social Connections: Not on file  Intimate Partner Violence: Not on file     No Known Allergies   Outpatient Medications Prior to Visit  Medication Sig Dispense Refill   acetaminophen (TYLENOL) 325 MG tablet Take 2 tablets (650 mg total) by mouth every 4 (four) hours as needed for mild pain (or temp > 37.5 C (99.5 F)).     furosemide (LASIX) 40 MG tablet Take 40 mg by mouth daily as needed  for fluid or edema.     gabapentin (NEURONTIN) 100 MG capsule Take 100 mg by mouth 2 (two) times daily.     Glycerin-Hypromellose-PEG 400 (ARTIFICIAL TEARS) 0.2-0.2-1 % SOLN Place 1 drop into both eyes daily as needed (tears).     levothyroxine (SYNTHROID) 112 MCG tablet Take 1 tablet (112 mcg total) by mouth every morning. 30 tablet 0   metoprolol succinate (TOPROL XL) 25 MG 24 hr tablet Take 1 tablet (25 mg total) by mouth daily. 90 tablet 3   sertraline (ZOLOFT) 50 MG tablet Take 1 tablet (50 mg total) by mouth daily. 30 tablet 0   No facility-administered medications prior to visit.    Review of Systems  Constitutional:  Positive for malaise/fatigue. Negative for chills, fever and weight loss.  HENT:  Negative for congestion, nosebleeds, sinus pain and sore  throat.   Eyes:  Negative for photophobia, pain and discharge.  Respiratory:  Positive for cough and shortness of breath. Negative for hemoptysis, sputum production and wheezing.   Cardiovascular:  Positive for leg swelling. Negative for chest pain, palpitations and orthopnea.  Gastrointestinal:  Negative for abdominal pain, constipation, diarrhea, nausea and vomiting.  Genitourinary:  Negative for dysuria, frequency, hematuria and urgency.  Musculoskeletal:  Negative for back pain, joint pain, myalgias and neck pain.  Skin:  Negative for itching and rash.  Neurological:  Negative for tingling, tremors, sensory change, speech change, focal weakness, seizures, weakness and headaches.  Psychiatric/Behavioral:  Negative for memory loss, substance abuse and suicidal ideas. The patient is not nervous/anxious.       Objective:  Physical Exam   Vitals:   12/18/21 1109  BP: 100/70  Pulse: 63  SpO2: 93%  Weight: 148 lb (67.1 kg)  Height: '5\' 2"'$  (1.575 m)    Gen: well appearing, no acute distress HENT: NCAT, OP clear, neck supple without masses Eyes: PERRL, EOMi Lymph: no cervical lymphadenopathy PULM: Coarse crackles R base, some crackles left base B CV: RRR, no mgr, no JVD GI: BS+, soft, nontender, no hsm Derm: no rash or skin breakdown, ankle edema bilaterally  MSK: normal bulk and tone Neuro: A&Ox4, CN II-XII intact, strength 5/5 in all 4 extremities Psyche: normal mood and affect   CBC    Component Value Date/Time   WBC 7.4 09/03/2021 1700   RBC 4.41 09/03/2021 1700   HGB 12.2 09/03/2021 1700   HGB 11.6 07/04/2021 1057   HCT 39.2 09/03/2021 1700   HCT 35.4 07/04/2021 1057   PLT 165 09/03/2021 1700   PLT 181 07/04/2021 1057   MCV 88.9 09/03/2021 1700   MCV 83 07/04/2021 1057   MCH 27.7 09/03/2021 1700   MCHC 31.1 09/03/2021 1700   RDW 16.2 (H) 09/03/2021 1700   RDW 15.8 (H) 07/04/2021 1057   LYMPHSABS 0.4 (L) 09/03/2021 1700   MONOABS 0.5 09/03/2021 1700   EOSABS  0.1 09/03/2021 1700   BASOSABS 0.0 09/03/2021 1700     Chest imaging: August 2023 CT abdomen pelvis lung windows independently reviewed showing significant left lower lobe bronchiectasis and volume loss, no pleural effusion with some right lower lobe bronchiectasis  PFT:  Labs: July 2023 hemoglobin 12.2 g/dL  Path:  Echo: May 2023 TEE: Watchman device in place, left atrium severely dilated, no thrombus, LVEF 60 to 65%, RV function moderately reduced with moderate enlargement, right atrium severely dilated, moderate mitral valve regurgitation, moderate to severe tricuspid regurgitation tricuspid regurgitant jet velocity 342 cm/s  Heart Catheterization:  Ambulatory oximetry: 12/2021 walked approximately  300 feet on room air O2 saturation stayed between 95 and 98%.  At peak exercise when she needed to stop due to dizziness her heart rate went to 170.  O2 saturation was 98% at that point. Confirmed with second test I performed myself, see below     Assessment & Plan:   Shortness of breath - Plan: CBC w/Diff, B Nat Peptide  Chronic atrial fibrillation (HCC)  Ankle swelling, unspecified laterality  Discussion: 88 y/o female presents for evaluation of dyspnea on exertion in setting of significant underlying cardiac disease, physical deconditioning, and advanced age.  In addition to all this it appears that she has some degree of chronic lung disease as the CT scan of her abdomen a couple weeks ago showed bronchiectasis and scarring in the left lower lobe.  This is in keeping with her physical exam findings as well.  Its not clear to me why she has this but it could certainly contribute to her shortness of breath.  Overall, I think her dyspnea is multifactorial.  I am concerned about the fact that her ankle swelling has been worsening so we need to assess for heart failure.  Anemia could also be contributing as she has had a history of this in the past.  Today in the office her oxygen  level was measured on two separate occasions, once by nursing and second by me.  On both occasions her O2 saturation remained within normal range but she had to stop due to dyspnea and dizziness.  Her heart rate went as high as 170.  Explained to her daughter at length that oxygen therapy would not be helpful considering the fact that she does not desaturate while walking.  Daughter was noted to be recording our conversation without my consent.  I kindly asked her to request consent whenever recording a conversation in the future.  Plan: Shortness of breath: As we discussed today there is many possible causes for this CBC proBNP Full pulmonary function test High-resolution CT scan of the chest  Atrial fibrillation and diastolic heart failure: Chronic diastolic  Keep taking medications as prescribed by the cardiology clinic including Lasix for ankle swelling I'll let the cardiology clinic know that her heart rate went so high with exercise to see if anything else can be done.  Physical deconditioning: I think it is a good idea to try to exercise on a regular basis.  We will bring you back in 3-4 weeks to go over the results of the testing so that we can have a better understanding of why you are experiencing the shortness of breath.     Immunizations:  There is no immunization history on file for this patient.   Current Outpatient Medications:    acetaminophen (TYLENOL) 325 MG tablet, Take 2 tablets (650 mg total) by mouth every 4 (four) hours as needed for mild pain (or temp > 37.5 C (99.5 F))., Disp: , Rfl:    furosemide (LASIX) 40 MG tablet, Take 40 mg by mouth daily as needed for fluid or edema., Disp: , Rfl:    gabapentin (NEURONTIN) 100 MG capsule, Take 100 mg by mouth 2 (two) times daily., Disp: , Rfl:    Glycerin-Hypromellose-PEG 400 (ARTIFICIAL TEARS) 0.2-0.2-1 % SOLN, Place 1 drop into both eyes daily as needed (tears)., Disp: , Rfl:    levothyroxine (SYNTHROID) 112 MCG  tablet, Take 1 tablet (112 mcg total) by mouth every morning., Disp: 30 tablet, Rfl: 0   metoprolol succinate (TOPROL XL) 25 MG  24 hr tablet, Take 1 tablet (25 mg total) by mouth daily., Disp: 90 tablet, Rfl: 3   sertraline (ZOLOFT) 50 MG tablet, Take 1 tablet (50 mg total) by mouth daily., Disp: 30 tablet, Rfl: 0

## 2021-12-18 NOTE — Telephone Encounter (Signed)
This patient was seen by pulmonary medicine and is having issues with tachycardia versus AF. She is a [name] patient. Can you please schedule her to be seen by him or an APP for follow up. She has a recall in for 08/2022.   Thank you  Sharee Pimple

## 2021-12-26 ENCOUNTER — Ambulatory Visit (HOSPITAL_BASED_OUTPATIENT_CLINIC_OR_DEPARTMENT_OTHER)
Admission: RE | Admit: 2021-12-26 | Discharge: 2021-12-26 | Disposition: A | Discharge status: Home / Self Care | Payer: Medicare Other | Source: Ambulatory Visit | Attending: Pulmonary Disease | Admitting: Pulmonary Disease

## 2021-12-26 ENCOUNTER — Encounter (HOSPITAL_BASED_OUTPATIENT_CLINIC_OR_DEPARTMENT_OTHER): Payer: Self-pay

## 2021-12-26 DIAGNOSIS — R0602 Shortness of breath: Secondary | ICD-10-CM | POA: Diagnosis present

## 2021-12-31 DIAGNOSIS — R21 Rash and other nonspecific skin eruption: Secondary | ICD-10-CM | POA: Insufficient documentation

## 2021-12-31 NOTE — Progress Notes (Unsigned)
Cardiology Office Note   Date:  01/01/2022   ID:  [name], DOB 13-Oct-1930, MRN [id number]  PCP:  Katrina Stalker, PA-C  Cardiologist:   [name], MD   Chief Complaint  Patient presents with   Fatigue      History of Present Illness: Katrina Randolph is a 88 y.o. female who for follow up of chronic atrial fibrillation.  She is not on anticoagulation due to anemia that has required multiple transfusions.  There is been no clear source of her bleeding.  She was admitted to the hospital 10/12/2019-10/20/2019 with rapid atrial fibrillation, pneumonia, anemia, acute thrombocytopenia, and orthostatic dizziness.  She had presented to her PCP and was found to have a tachycardic rhythm with a heart rate greater than 160 bpm.  We were involved to help with heart rate control.  She had active bleeding and GI evaluation with colonoscopy and EGD with no clear source identified.  She has a hiatal hernia and Cameron's erosions.  She has some small polyps and diverticulosis as well as hemorrhoids.  She has had orthostatic hypotension.   Cozaar was stopped.   She was being considered for a Watchman but eventually had a CVA.   She ultimately had Watchman.  TEE follow up demonstrated normal Watchman with moderate to severe TR and RAE.  Pradaxa was stopped and she was started on Plavix.     She returns for follow up.  She saw Dr. Lake Bells and has been having increasing shortness of breath.  He is planning pulmonary function testing.  BNP was mildly elevated.  When he walked around the office to check her oxygen sats her heart rate apparently went into the 170s.  She returns today and her biggest complaint is fatigue.  She does have shortness of breath with activity but her biggest issue seems to be lack of energy which is all the time.  She does not think she sleeps really well.  Does not describe any PND or orthopnea however.  She is not having any chest pressure, neck or arm discomfort.  She  does not feel her palpitations.  She had orthostatic dizziness in the past but she has not had any presyncope or syncope recently.  She has a very unsteady gait and walks with a walker.   Past Medical History:  Diagnosis Date   Atrial fibrillation ([place])    Presence of Watchman left atrial appendage closure device 05/24/2021   Watchman FLX 47m with Dr. LQuentin Ore   Past Surgical History:  Procedure Laterality Date   ABDOMINAL HYSTERECTOMY     BIOPSY  10/19/2019   Procedure: BIOPSY;  Surgeon: BOtis Brace MD;  Location: [facility];  Service: Gastroenterology;;   BREAST LUMPECTOMY     CHOLECYSTECTOMY     COLONOSCOPY WITH PROPOFOL N/A 10/19/2019   Procedure: COLONOSCOPY WITH PROPOFOL;  Surgeon: BOtis Brace MD;  Location: [facility];  Service: Gastroenterology;  Laterality: N/A;   ESOPHAGOGASTRODUODENOSCOPY (EGD) WITH PROPOFOL N/A 10/19/2019   Procedure: ESOPHAGOGASTRODUODENOSCOPY (EGD) WITH PROPOFOL;  Surgeon: BOtis Brace MD;  Location: [facility];  Service: Gastroenterology;  Laterality: N/A;   LEFT ATRIAL APPENDAGE OCCLUSION N/A 05/24/2021   Procedure: LEFT ATRIAL APPENDAGE OCCLUSION;  Surgeon: LVickie Epley MD;  [facility];  Service: Cardiovascular;  Laterality: N/A;   POLYPECTOMY  10/19/2019   Procedure: POLYPECTOMY;  Surgeon: BOtis Brace MD;  [facility];  Service: Gastroenterology;;   TEE WITHOUT CARDIOVERSION N/A 05/24/2021   Procedure:  TRANSESOPHAGEAL ECHOCARDIOGRAM (TEE);  Surgeon: [name], MD;  Location: [facility];  Service: Cardiovascular;  Laterality: N/A;   TEE WITHOUT CARDIOVERSION N/A 07/11/2021   Procedure: TRANSESOPHAGEAL ECHOCARDIOGRAM (TEE);  Surgeon: Geralynn Rile, MD;  Location: [facility];  Service: Cardiovascular;  Laterality: N/A;     Current Outpatient Medications  Medication Sig Dispense Refill   acetaminophen (TYLENOL) 325 MG tablet Take 2 tablets (650 mg total) by mouth  every 4 (four) hours as needed for mild pain (or temp > 37.5 C (99.5 F)).     furosemide (LASIX) 40 MG tablet Take 40 mg by mouth daily as needed for fluid or edema.     gabapentin (NEURONTIN) 100 MG capsule Take 100 mg by mouth 2 (two) times daily.     levothyroxine (SYNTHROID) 112 MCG tablet Take 1 tablet (112 mcg total) by mouth every morning. 30 tablet 0   metoprolol succinate (TOPROL XL) 25 MG 24 hr tablet Take 1 tablet (25 mg total) by mouth daily. 90 tablet 3   sertraline (ZOLOFT) 50 MG tablet Take 1 tablet (50 mg total) by mouth daily. 30 tablet 0   Glycerin-Hypromellose-PEG 400 (ARTIFICIAL TEARS) 0.2-0.2-1 % SOLN Place 1 drop into both eyes daily as needed (tears). (Patient not taking: Reported on 01/01/2022)     No current facility-administered medications for this visit.    Allergies:   Patient has no known allergies.    ROS:  Please see the history of present illness.   Otherwise, review of systems are positive for none.   All other systems are reviewed and negative.    PHYSICAL EXAM: VS:  BP 110/62 (BP Location: Left Arm, Patient Position: Sitting, Cuff Size: Normal)   Pulse 83   Ht '5\' 2"'$  (1.575 m)   Wt 148 lb (67.1 kg)   SpO2 96%   BMI 27.07 kg/m  , BMI Body mass index is 27.07 kg/m. GENERAL:  Well appearing NECK:  No jugular venous distention, waveform within normal limits, carotid upstroke brisk and symmetric, left bruits, no thyromegaly LUNGS:  Clear to auscultation bilaterally CHEST:  Unremarkable HEART:  PMI not displaced or sustained,S1 and S2 within normal limits, no S3, no S4, no clicks, no rubs, no murmurs ABD:  Flat, positive bowel sounds normal in frequency in pitch, no bruits, no rebound, no guarding, no midline pulsatile mass, no hepatomegaly, no splenomegaly EXT:  2 plus pulses throughout, no edema, no cyanosis no clubbing    EKG:  EKG is  ordered today. Atrial fibrillation, rate 83, axis within normal limits, intervals within normal limits, no acute  ST-T wave changes.   Recent Labs: 09/03/2021: ALT 13; BUN 19; Creatinine, Ser 0.92; Potassium 3.8; Sodium 144 12/18/2021: Hemoglobin 11.1; Platelets 215.0; Pro B Natriuretic peptide (BNP) 263.0    Lipid Panel    Component Value Date/Time   CHOL 134 09/01/2020 0434   TRIG 65 09/01/2020 0434   HDL 48 09/01/2020 0434   CHOLHDL 2.8 09/01/2020 0434   VLDL 13 09/01/2020 0434   LDLCALC 73 09/01/2020 0434      Wt Readings from Last 3 Encounters:  01/01/22 148 lb (67.1 kg)  12/18/21 148 lb (67.1 kg)  11/19/21 153 lb 3.2 oz (69.5 kg)      Other studies Reviewed: Additional studies/ records that were reviewed today include: None Review of the above records demonstrates:  Please see elsewhere in the note.     ASSESSMENT AND PLAN:   Atrial fibrillation with RVR:  I am going to apply a 3-day monitor.  I will check rate control with that.  No change in therapy until I see this result.   Essential hypertension:   Blood pressure today was at target.  Continue meds as listed.  Shortness of breath: She did have mildly elevated BNP but I am reluctant to increase diuretics as she does not sound volume overloaded and she has had orthostasis in the past.  I will defer any change in therapy until her pulmonary function testing.    Orthostatic hypotension:    She understands the need to move slowly and caution with her orthostatic changes.  Hypothyroidism:    She states she did have blood work done recently but I do not see a TSH.  I do not have access to these labs and I have asked her to check with Dr. Rolland Porter.  We will call is where and see if her thyroid has been done.  Watchman: She seems to be doing well status post watchman.   Current medicines are reviewed at length with the patient today.  The patient does not have concerns regarding medicines.  The following changes have been made:  None  Labs/ tests ordered today include:    Orders Placed This Encounter  Procedures   LONG  TERM MONITOR (3-14 DAYS)   EKG 12-Lead   VAS US CAROTID     Disposition:   FU with one month.    Signed, [name], MD  01/01/2022 5:36 PM    [place]  ]

## 2022-01-01 ENCOUNTER — Encounter: Payer: Self-pay | Admitting: Cardiology

## 2022-01-01 ENCOUNTER — Ambulatory Visit (INDEPENDENT_AMBULATORY_CARE_PROVIDER_SITE_OTHER): Payer: Medicare Other

## 2022-01-01 ENCOUNTER — Ambulatory Visit: Payer: Medicare Other | Attending: Cardiology | Admitting: Cardiology

## 2022-01-01 VITALS — BP 110/62 | HR 83 | Ht 62.0 in | Wt 148.0 lb

## 2022-01-01 DIAGNOSIS — I10 Essential (primary) hypertension: Secondary | ICD-10-CM | POA: Diagnosis not present

## 2022-01-01 DIAGNOSIS — I480 Paroxysmal atrial fibrillation: Secondary | ICD-10-CM | POA: Diagnosis not present

## 2022-01-01 DIAGNOSIS — R21 Rash and other nonspecific skin eruption: Secondary | ICD-10-CM

## 2022-01-01 DIAGNOSIS — Z95818 Presence of other cardiac implants and grafts: Secondary | ICD-10-CM | POA: Diagnosis not present

## 2022-01-01 DIAGNOSIS — R0989 Other specified symptoms and signs involving the circulatory and respiratory systems: Secondary | ICD-10-CM

## 2022-01-01 DIAGNOSIS — I951 Orthostatic hypotension: Secondary | ICD-10-CM

## 2022-01-01 NOTE — Patient Instructions (Signed)
Medication Instructions:  Your physician recommends that you continue on your current medications as directed. Please refer to the Current Medication list given to you today.  *If you need a refill on your cardiac medications before your next appointment, please call your pharmacy*  Testing/Procedures: Your physician has requested that you have a carotid duplex. This test is an ultrasound of the carotid arteries in your neck. It looks at blood flow through these arteries that supply the brain with blood. Allow one hour for this exam. There are no restrictions or special instructions.  ZIO XT- Long Term Monitor Instructions   Your physician has requested you wear a ZIO patch monitor for _3_ days.  This is a single patch monitor.   [employer] supplies one patch monitor per enrollment. Additional stickers are not available. Please do not apply patch if you will be having a Nuclear Stress Test, Echocardiogram, Cardiac CT, MRI, or Chest Xray during the period you would be wearing the monitor. The patch cannot be worn during these tests. You cannot remove and re-apply the ZIO XT patch monitor.  Your ZIO patch monitor will be sent Fed Ex from [employer] directly to your home address. It may take 3-5 days to receive your monitor after you have been enrolled.  Once you have received your monitor, please review the enclosed instructions. Your monitor has already been registered assigning a specific monitor serial # to you.  Billing and Patient Assistance Program Information   We have supplied [employer] with any of your insurance information on file for billing purposes. [employer] offers a sliding scale Patient Assistance Program for patients that do not have insurance, or whose insurance does not completely cover the cost of the ZIO monitor.   You must apply for the Patient Assistance Program to qualify for this discounted rate.     To apply, please call [employer] at [phone], select option 4, then  select option 2, and ask to apply for Patient Assistance Program.  Theodore Demark will ask your household income, and how many people are in your household.  They will quote your out-of-pocket cost based on that information.  [employer] will also be able to set up a 3-month interest-free payment plan if needed.  Applying the monitor   Shave hair from upper left chest.  Hold abrader disc by orange tab. Rub abrader in 40 strokes over the upper left chest as indicated in your monitor instructions.  Clean area with 4 enclosed alcohol pads. Let dry.  Apply patch as indicated in monitor instructions. Patch will be placed under collarbone on left side of chest with arrow pointing upward.  Rub patch adhesive wings for 2 minutes. Remove white label marked "1". Remove the white label marked "2". Rub patch adhesive wings for 2 additional minutes.  While looking in a mirror, press and release button in center of patch. A small green light will flash 3-4 times. This will be your only indicator that the monitor has been turned on. ?  Do not shower for the first 24 hours. You may shower after the first 24 hours.  Press the button if you feel a symptom. You will hear a small click. Record Date, Time and Symptom in the Patient Logbook.  When you are ready to remove the patch, follow instructions on the last 2 pages of the Patient Logbook. Stick patch monitor onto the last page of Patient Logbook.  Place Patient Logbook in the blue and white box.  Use locking tab on box and  tape box closed securely.  The blue and white box has prepaid postage on it. Please place it in the mailbox as soon as possible. Your physician should have your test results approximately 7 days after the monitor has been mailed back to [facility].  Call [place] at [phone] if you have questions regarding your ZIO XT patch monitor. Call them immediately if you see an orange light blinking on your monitor.  If your monitor falls  off in less than 4 days, contact our Monitor department at [phone]. ?If your monitor becomes loose or falls off after 4 days call [employer] at [phone] for suggestions on securing your monitor.?  Follow-Up: At [facility], you and your health needs are our priority.  As part of our continuing mission to provide you with exceptional heart care, we have created designated Provider Care Teams.  These Care Teams include your primary Cardiologist (physician) and Advanced Practice Providers (APPs -  Physician Assistants and Nurse Practitioners) who all work together to provide you with the care you need, when you need it.  We recommend signing up for the patient portal called "MyChart".  Sign up information is provided on this After Visit Summary.  MyChart is used to connect with patients for Virtual Visits (Telemedicine).  Patients are able to view lab/test results, encounter notes, upcoming appointments, etc.  Non-urgent messages can be sent to your provider as well.   To learn more about what you can do with MyChart, go to [email or web address].    Your next appointment:   1 month(s)  The format for your next appointment:   In Person  Provider:   [name], MD

## 2022-01-01 NOTE — Progress Notes (Unsigned)
Enrolled patient for a 3 day Zio XT monitor to be mailed to patients home  

## 2022-01-04 DIAGNOSIS — I480 Paroxysmal atrial fibrillation: Secondary | ICD-10-CM

## 2022-01-10 ENCOUNTER — Ambulatory Visit (HOSPITAL_COMMUNITY)
Admission: RE | Admit: 2022-01-10 | Discharge: 2022-01-10 | Disposition: A | Discharge status: Home / Self Care | Payer: Medicare Other | Source: Ambulatory Visit | Attending: Cardiology | Admitting: Cardiology

## 2022-01-10 DIAGNOSIS — R0989 Other specified symptoms and signs involving the circulatory and respiratory systems: Secondary | ICD-10-CM | POA: Diagnosis not present

## 2022-01-16 NOTE — Progress Notes (Signed)
Synopsis: Referred in November 2023 for shortness of breath and cough.  Has a history significant for atrial fibrillation, ischemic stroke, status post Watchman procedure April 2023.  Noted on chest imaging to have significant bronchiectasis and volume loss in the left lower lobe, does not recall any particular trauma, effusion, or pneumonia.  Medical records indicate that she had pneumonia in the left lower lobe in 2021  Subjective:   PATIENT ID: Nenzel: female DOB: May 17, 1930, MRN: [id number]   HPI  Chief Complaint  Patient presents with   Follow-up    Pt is here for follow up for SOB. Pt had full pft done yesterday. Atrovent was called in but Im not seeing anything on last AVS. Pt states she was unsure about the inhaler and has not taken it that much.     Alzada says that her breathing is no different than the last time she saw [place].  She continues to have shortness of breath when she exerts herself.  She has not had cough or mucus production.  No recent fevers or chills.  She wore a event monitor for cardiology but has not heard the results of that yet.  She states today that she does not exercise and she is not really interested in that.  Past Medical History:  Diagnosis Date   Atrial fibrillation ([place])    Presence of Watchman left atrial appendage closure device 05/24/2021   Watchman FLX 55m with Dr. LQuentin Ore   Review of Systems  Constitutional:  Negative for chills, fever, malaise/fatigue and weight loss.  HENT:  Negative for congestion, sinus pain and sore throat.   Respiratory:  Positive for shortness of breath. Negative for cough and sputum production.   Cardiovascular:  Negative for chest pain and leg swelling.      Objective:  Physical Exam   Vitals:   01/18/22 1126  BP: 124/68  Weight: 149 lb 3.2 oz (67.7 kg)    Gen: well appearing HENT: OP clear, neck supple PULM: Crackles LLL, normal effort  CV: Irreg irreg, no mgr GI: BS+, soft,  nontender Derm: no cyanosis or rash Psyche: normal mood and affect    CBC    Component Value Date/Time   WBC 9.4 12/18/2021 1329   RBC 4.06 12/18/2021 1329   HGB 11.1 (L) 12/18/2021 1329   HGB 11.6 07/04/2021 1057   HCT 34.6 (L) 12/18/2021 1329   HCT 35.4 07/04/2021 1057   PLT 215.0 12/18/2021 1329   PLT 181 07/04/2021 1057   MCV 85.2 12/18/2021 1329   MCV 83 07/04/2021 1057   MCH 27.7 09/03/2021 1700   MCHC 32.1 12/18/2021 1329   RDW 17.5 (H) 12/18/2021 1329   RDW 15.8 (H) 07/04/2021 1057   LYMPHSABS 0.6 (L) 12/18/2021 1329   MONOABS 0.8 12/18/2021 1329   EOSABS 0.3 12/18/2021 1329   BASOSABS 0.1 12/18/2021 1329     Chest imaging: August 2023 CT abdomen pelvis lung windows independently reviewed showing significant left lower lobe bronchiectasis and volume loss, no pleural effusion with some right lower lobe bronchiectasis November 2023 high-resolution CT chest images personally reviewed showing nonspecific micronodular abnormalities in the anterior segment of the right upper lobe and right middle lobe, significant volume loss with bronchiectasis in the left lower lobe  PFT: 01/2022 PFT: Ratio 88%, FVC 1.98L (111% pred)  Labs: July 2023 hemoglobin 12.2 g/dL  Path:  Echo: May 2023 TEE: Watchman device in place, left atrium severely dilated, no thrombus, LVEF 60 to 65%,  RV function moderately reduced with moderate enlargement, right atrium severely dilated, moderate mitral valve regurgitation, moderate to severe tricuspid regurgitation tricuspid regurgitant jet velocity 342 cm/s  Heart Catheterization:  Ambulatory oximetry: 12/2021 walked approximately 300 feet on room air O2 saturation stayed between 95 and 98%.  At peak exercise when she needed to stop due to dizziness her heart rate went to 170.  O2 saturation was 98% at that point. Confirmed with second test I performed myself, see below     Assessment & Plan:   Bronchiectasis without complication ([place]) - Plan:  Flutter valve  DOE (dyspnea on exertion)  Atrial fibrillation with rapid ventricular response ([place])  Discussion: She has bronchiectasis in the left lower lobe but somehow has remarkably preserved lung function.  Oxygenation is normal.  Dyspnea is multifactorial: In part due to her bronchiectasis, in part due to her exercise-induced atrial fibrillation with rapid ventricular response, in part due to physical deconditioning.  There is not an inhaled therapy that would be beneficial given her lung function and high likelihood of side effects from atrial fibrillation.  It would be best if she exercised more.  I offered the echo rehab, she declined.  Plan: Bronchiectasis: If you develop increasing cough, mucus production or chest congestion use Mucinex over-the-counter as directed and use a flutter valve 8 to 10 breaths, 3 times a day Call me if you get cough and chest congestion so that we can call in an antibiotic  Shortness of breath: I think the best approach would be to go to cardiac rehab, let me know if you are interested in this  Atrial fibrillation: Continue follow-up with cardiology  We will see you back in 1 year or sooner if needed    Immunizations:  There is no immunization history on file for this patient.   Current Outpatient Medications:    acetaminophen (TYLENOL) 325 MG tablet, Take 2 tablets (650 mg total) by mouth every 4 (four) hours as needed for mild pain (or temp > 37.5 C (99.5 F))., Disp: , Rfl:    ATROVENT HFA 17 MCG/ACT inhaler, Inhale 2 puffs into the lungs every 4 (four) hours as needed., Disp: , Rfl:    furosemide (LASIX) 40 MG tablet, Take 40 mg by mouth daily as needed for fluid or edema., Disp: , Rfl:    gabapentin (NEURONTIN) 100 MG capsule, Take 100 mg by mouth 2 (two) times daily., Disp: , Rfl:    Glycerin-Hypromellose-PEG 400 (ARTIFICIAL TEARS) 0.2-0.2-1 % SOLN, Place 1 drop into both eyes daily as needed (tears)., Disp: , Rfl:    levothyroxine  (SYNTHROID) 112 MCG tablet, Take 1 tablet (112 mcg total) by mouth every morning., Disp: 30 tablet, Rfl: 0   metoprolol succinate (TOPROL XL) 25 MG 24 hr tablet, Take 1 tablet (25 mg total) by mouth daily., Disp: 90 tablet, Rfl: 3   sertraline (ZOLOFT) 50 MG tablet, Take 1 tablet (50 mg total) by mouth daily., Disp: 30 tablet, Rfl: 0  > 50% of this 40 minute visit spent face to face

## 2022-01-17 ENCOUNTER — Ambulatory Visit: Payer: Medicare Other | Admitting: Pulmonary Disease

## 2022-01-17 DIAGNOSIS — R0602 Shortness of breath: Secondary | ICD-10-CM | POA: Diagnosis not present

## 2022-01-17 LAB — PULMONARY FUNCTION TEST
FEF 25-75 Post: 2.43 L/sec
FEF 25-75 Pre: 1.06 L/sec
FEF2575-%Change-Post: 129 %
FEF2575-%Pred-Post: 330 %
FEF2575-%Pred-Pre: 143 %
FEV1-%Change-Post: 18 %
FEV1-%Pred-Post: 135 %
FEV1-%Pred-Pre: 114 %
FEV1-Post: 1.74 L
FEV1-Pre: 1.47 L
FEV1FVC-%Change-Post: 18 %
FEV1FVC-%Pred-Pre: 103 %
FEV6-%Change-Post: 0 %
FEV6-%Pred-Post: 120 %
FEV6-%Pred-Pre: 120 %
FEV6-Post: 1.98 L
FEV6-Pre: 1.99 L
FEV6FVC-%Pred-Post: 108 %
FEV6FVC-%Pred-Pre: 108 %
FVC-%Change-Post: 0 %
FVC-%Pred-Post: 111 %
FVC-%Pred-Pre: 111 %
FVC-Post: 1.98 L
FVC-Pre: 1.99 L
Post FEV1/FVC ratio: 88 %
Post FEV6/FVC ratio: 100 %
Pre FEV1/FVC ratio: 74 %
Pre FEV6/FVC Ratio: 100 %

## 2022-01-17 NOTE — Progress Notes (Signed)
Spirometry pre and post done today. 

## 2022-01-18 ENCOUNTER — Encounter: Payer: Self-pay | Admitting: Pulmonary Disease

## 2022-01-18 ENCOUNTER — Ambulatory Visit: Payer: Medicare Other | Admitting: Pulmonary Disease

## 2022-01-18 VITALS — BP 124/68 | Wt 149.2 lb

## 2022-01-18 DIAGNOSIS — J479 Bronchiectasis, uncomplicated: Secondary | ICD-10-CM

## 2022-01-18 DIAGNOSIS — R0609 Other forms of dyspnea: Secondary | ICD-10-CM

## 2022-01-18 DIAGNOSIS — I4891 Unspecified atrial fibrillation: Secondary | ICD-10-CM

## 2022-01-18 NOTE — Patient Instructions (Signed)
Bronchiectasis: If you develop increasing cough, mucus production or chest congestion use Mucinex over-the-counter as directed and use a flutter valve 8 to 10 breaths, 3 times a day Call me if you get cough and chest congestion so that we can call in an antibiotic  Shortness of breath: I think the best approach would be to go to cardiac rehab, let me know if you are interested in this  Atrial fibrillation: Continue follow-up with cardiology  We will see you back in 1 year or sooner if needed

## 2022-01-21 ENCOUNTER — Telehealth: Payer: Self-pay | Admitting: *Deleted

## 2022-01-21 MED ORDER — METOPROLOL SUCCINATE ER 50 MG PO TB24: 50.0000 mg | ORAL_TABLET | Freq: Every day | ORAL | 3 refills | Status: DC

## 2022-01-21 NOTE — Telephone Encounter (Signed)
pt aware of results  New script sent to the pharmacy

## 2022-01-21 NOTE — Telephone Encounter (Signed)
-----   Message from [name], MD sent at 01/20/2022 12:39 PM EST ----- HR is slightly high.  I would like to see if she tolerates Toprol XL 50 mg po daily.  Call Ms. Lindfors with the results and send results to [name], PA-C

## 2022-01-31 NOTE — Progress Notes (Deleted)
Cardiology Office Note   Date:  01/31/2022   ID:  Katrina Randolph, DOB 01/11/1931, MRN [id number]  PCP:  [name], PA-C  Cardiologist:   [name], MD   No chief complaint on file.     History of Present Illness: Katrina Randolph is a 88 y.o. female who for follow up of chronic atrial fibrillation.  She is not on anticoagulation due to anemia that has required multiple transfusions.  There is been no clear source of her bleeding.  She was admitted to the hospital 10/12/2019-10/20/2019 with rapid atrial fibrillation, pneumonia, anemia, acute thrombocytopenia, and orthostatic dizziness.  She is status post Watchman.   At the last visit she had a monitor which demonstrated an elevated ventricular rate.   ***   ***  She had presented to her PCP and was found to have a tachycardic rhythm with a heart rate greater than 160 bpm.  We were involved to help with heart rate control.  She had active bleeding and GI evaluation with colonoscopy and EGD with no clear source identified.  She has a hiatal hernia and Cameron's erosions.  She has some small polyps and diverticulosis as well as hemorrhoids.  She has had orthostatic hypotension.   Cozaar was stopped.   She was being considered for a Watchman but eventually had a CVA.   She ultimately had Watchman.  TEE follow up demonstrated normal Watchman with moderate to severe TR and RAE.  Pradaxa was stopped and she was started on Plavix.     She returns for follow up.  She saw Dr. Lake Bells and has been having increasing shortness of breath.  He is planning pulmonary function testing.  BNP was mildly elevated.  When he walked around the office to check her oxygen sats her heart rate apparently went into the 170s.  She returns today and her biggest complaint is fatigue.  She does have shortness of breath with activity but her biggest issue seems to be lack of energy which is all the time.  She does not think she sleeps really well.  Does not  describe any PND or orthopnea however.  She is not having any chest pressure, neck or arm discomfort.  She does not feel her palpitations.  She had orthostatic dizziness in the past but she has not had any presyncope or syncope recently.  She has a very unsteady gait and walks with a walker.   Past Medical History:  Diagnosis Date   Atrial fibrillation ([place])    Presence of Watchman left atrial appendage closure device 05/24/2021   Watchman FLX 29m with Dr. LQuentin Ore   Past Surgical History:  Procedure Laterality Date   ABDOMINAL HYSTERECTOMY     BIOPSY  10/19/2019   Procedure: BIOPSY;  Surgeon: BOtis Brace MD;  Location: [facility];  Service: Gastroenterology;;   BREAST LUMPECTOMY     CHOLECYSTECTOMY     COLONOSCOPY WITH PROPOFOL N/A 10/19/2019   Procedure: COLONOSCOPY WITH PROPOFOL;  Surgeon: BOtis Brace MD;  Location: [facility];  Service: Gastroenterology;  Laterality: N/A;   ESOPHAGOGASTRODUODENOSCOPY (EGD) WITH PROPOFOL N/A 10/19/2019   Procedure: ESOPHAGOGASTRODUODENOSCOPY (EGD) WITH PROPOFOL;  Surgeon: BOtis Brace MD;  Location: [facility];  Service: Gastroenterology;  Laterality: N/A;   LEFT ATRIAL APPENDAGE OCCLUSION N/A 05/24/2021   Procedure: LEFT ATRIAL APPENDAGE OCCLUSION;  Surgeon: LVickie Epley MD;  [facility];  Service: Cardiovascular;  Laterality: N/A;   POLYPECTOMY  10/19/2019   Procedure:  POLYPECTOMY;  Surgeon: [name], MD;  Location: [facility];  Service: Gastroenterology;;   TEE WITHOUT CARDIOVERSION N/A 05/24/2021   Procedure: TRANSESOPHAGEAL ECHOCARDIOGRAM (TEE);  Surgeon: [name], MD;  Location: [place] [facility];  Service: Cardiovascular;  Laterality: N/A;   TEE WITHOUT CARDIOVERSION N/A 07/11/2021   Procedure: TRANSESOPHAGEAL ECHOCARDIOGRAM (TEE);  Surgeon: Geralynn Rile, MD;  Location: [place];  Service: Cardiovascular;  Laterality: N/A;     Current Outpatient Medications   Medication Sig Dispense Refill   acetaminophen (TYLENOL) 325 MG tablet Take 2 tablets (650 mg total) by mouth every 4 (four) hours as needed for mild pain (or temp > 37.5 C (99.5 F)).     ATROVENT HFA 17 MCG/ACT inhaler Inhale 2 puffs into the lungs every 4 (four) hours as needed.     furosemide (LASIX) 40 MG tablet Take 40 mg by mouth daily as needed for fluid or edema.     gabapentin (NEURONTIN) 100 MG capsule Take 100 mg by mouth 2 (two) times daily.     Glycerin-Hypromellose-PEG 400 (ARTIFICIAL TEARS) 0.2-0.2-1 % SOLN Place 1 drop into both eyes daily as needed (tears).     levothyroxine (SYNTHROID) 112 MCG tablet Take 1 tablet (112 mcg total) by mouth every morning. 30 tablet 0   metoprolol succinate (TOPROL XL) 50 MG 24 hr tablet Take 1 tablet (50 mg total) by mouth daily. 90 tablet 3   sertraline (ZOLOFT) 50 MG tablet Take 1 tablet (50 mg total) by mouth daily. 30 tablet 0   No current facility-administered medications for this visit.    Allergies:   Patient has no known allergies.    ROS:  Please see the history of present illness.   Otherwise, review of systems are positive for ***.   All other systems are reviewed and negative.    PHYSICAL EXAM: VS:  There were no vitals taken for this visit. , BMI There is no height or weight on file to calculate BMI. GENERAL:  Well appearing NECK:  No jugular venous distention, waveform within normal limits, carotid upstroke brisk and symmetric, no bruits, no thyromegaly LUNGS:  Clear to auscultation bilaterally CHEST:  Unremarkable HEART:  PMI not displaced or sustained,S1 and S2 within normal limits, no S3, no S4, no clicks, no rubs, *** murmurs ABD:  Flat, positive bowel sounds normal in frequency in pitch, no bruits, no rebound, no guarding, no midline pulsatile mass, no hepatomegaly, no splenomegaly EXT:  2 plus pulses throughout, no edema, no cyanosis no clubbing    ***GENERAL:  Well appearing NECK:  No jugular venous distention,  waveform within normal limits, carotid upstroke brisk and symmetric, left bruits, no thyromegaly LUNGS:  Clear to auscultation bilaterally CHEST:  Unremarkable HEART:  PMI not displaced or sustained,S1 and S2 within normal limits, no S3, no S4, no clicks, no rubs, no murmurs ABD:  Flat, positive bowel sounds normal in frequency in pitch, no bruits, no rebound, no guarding, no midline pulsatile mass, no hepatomegaly, no splenomegaly EXT:  2 plus pulses throughout, no edema, no cyanosis no clubbing    EKG:  EKG is *** ordered today. Atrial fibrillation, rate ***, axis within normal limits, intervals within normal limits, no acute ST-T wave changes.   Recent Labs: 09/03/2021: ALT 13; BUN 19; Creatinine, Ser 0.92; Potassium 3.8; Sodium 144 12/18/2021: Hemoglobin 11.1; Platelets 215.0; Pro B Natriuretic peptide (BNP) 263.0    Lipid Panel    Component Value Date/Time   CHOL 134 09/01/2020 0434  TRIG 65 09/01/2020 0434   HDL 48 09/01/2020 0434   CHOLHDL 2.8 09/01/2020 0434   VLDL 13 09/01/2020 0434   LDLCALC 73 09/01/2020 0434      Wt Readings from Last 3 Encounters:  01/18/22 149 lb 3.2 oz (67.7 kg)  01/01/22 148 lb (67.1 kg)  12/18/21 148 lb (67.1 kg)      Other studies Reviewed: Additional studies/ records that were reviewed today include: *** Review of the above records demonstrates:  Please see elsewhere in the note.     ASSESSMENT AND PLAN:   Atrial fibrillation with RVR:    ***   I am going to apply a 3-day monitor.  I will check rate control with that.  No change in therapy until I see this result.   Essential hypertension:   ***  Blood pressure today was at target.  Continue meds as listed.  Shortness of breath:     ***  She did have mildly elevated BNP but I am reluctant to increase diuretics as she does not sound volume overloaded and she has had orthostasis in the past.  I will defer any change in therapy until her pulmonary function testing.    Orthostatic  hypotension:    ***  She understands the need to move slowly and caution with her orthos***  tatic changes.  Hypothyroidism:   ***    She states she did have blood work done recently but I do not see a TSH.  I do not have access to these labs and I have asked her to check with Dr. Rolland Porter.  We will call is where and see if her thyroid has been done.  Watchman: ***  She seems to be doing well status post watchman.   Current medicines are reviewed at length with the patient today.  The patient does not have concerns regarding medicines.  The following changes have been made:  ***  Labs/ tests ordered today include:  ***  No orders of the defined types were placed in this encounter.    Disposition:   FU with *** month.    Signed, [name], MD  01/31/2022 9:12 PM    [facility]  ]

## 2022-02-01 ENCOUNTER — Ambulatory Visit: Payer: Medicare Other | Attending: Cardiology | Admitting: Cardiology

## 2022-02-07 ENCOUNTER — Encounter: Payer: Self-pay | Admitting: Cardiology

## 2022-03-14 ENCOUNTER — Ambulatory Visit: Payer: Medicare Other | Admitting: Cardiology

## 2022-03-14 ENCOUNTER — Telehealth: Payer: Self-pay | Admitting: Cardiology

## 2022-03-20 DIAGNOSIS — R0602 Shortness of breath: Secondary | ICD-10-CM | POA: Insufficient documentation

## 2022-03-20 NOTE — Progress Notes (Deleted)
Cardiology Office Note   Date:  03/20/2022   ID:  Ember, Henrikson Jul 22, 1930, MRN [id number]  PCP:  [name], PA-C  Cardiologist:   [name], MD   No chief complaint on file.     History of Present Illness: Katrina Randolph is a 89 y.o. female who for follow up of chronic atrial fibrillation.  She is not on anticoagulation due to anemia that has required multiple transfusions.  There is been no clear source of her bleeding.  She was admitted to the hospital 10/12/2019-10/20/2019 with rapid atrial fibrillation, pneumonia, anemia, acute thrombocytopenia, and orthostatic dizziness.  She had presented to her PCP and was found to have a tachycardic rhythm with a heart rate greater than 160 bpm.  We were involved to help with heart rate control.  She had active bleeding and GI evaluation with colonoscopy and EGD with no clear source identified.  She has a hiatal hernia and Cameron's erosions.  She has some small polyps and diverticulosis as well as hemorrhoids.  She has had orthostatic hypotension.   Cozaar was stopped.   She was being considered for a Watchman but eventually had a CVA.   She ultimately had Watchman.  TEE follow up demonstrated normal Watchman with moderate to severe TR and RAE.  Pradaxa was stopped and she was started on Plavix.     At the last visit I applied a 3 day monitor.  She had permanent atrial fib with an average ventricular rate of 100.  ***    She returns for follow up.  I added Toprol XL 50 mg PO daily. ***   ***  She saw Dr. Lake Bells and has been having increasing shortness of breath.  He is planning pulmonary function testing.  BNP was mildly elevated.  When he walked around the office to check her oxygen sats her heart rate apparently went into the 170s.  She returns today and her biggest complaint is fatigue.  She does have shortness of breath with activity but her biggest issue seems to be lack of energy which is all the time.  She does not  think she sleeps really well.  Does not describe any PND or orthopnea however.  She is not having any chest pressure, neck or arm discomfort.  She does not feel her palpitations.  She had orthostatic dizziness in the past but she has not had any presyncope or syncope recently.  She has a very unsteady gait and walks with a walker.   Past Medical History:  Diagnosis Date   Atrial fibrillation ([place])    Presence of Watchman left atrial appendage closure device 05/24/2021   Watchman FLX 66m with Dr. LQuentin Ore   Past Surgical History:  Procedure Laterality Date   ABDOMINAL HYSTERECTOMY     BIOPSY  10/19/2019   Procedure: BIOPSY;  Surgeon: BOtis Brace MD;  [facility]: [facility];  Service: Gastroenterology;;   BREAST LUMPECTOMY     CHOLECYSTECTOMY     COLONOSCOPY WITH PROPOFOL N/A 10/19/2019   Procedure: COLONOSCOPY WITH PROPOFOL;  Surgeon: BOtis Brace MD;  [facility];  Service: Gastroenterology;  Laterality: N/A;   ESOPHAGOGASTRODUODENOSCOPY (EGD) WITH PROPOFOL N/A 10/19/2019   Procedure: ESOPHAGOGASTRODUODENOSCOPY (EGD) WITH PROPOFOL;  Surgeon: BOtis Brace MD;  [facility];  Service: Gastroenterology;  Laterality: N/A;   LEFT ATRIAL APPENDAGE OCCLUSION N/A 05/24/2021   Procedure: LEFT ATRIAL APPENDAGE OCCLUSION;  Surgeon: LVickie Epley MD;  [facility];  Service: Cardiovascular;  Laterality: N/A;   POLYPECTOMY  10/19/2019   Procedure: POLYPECTOMY;  Surgeon: [name], MD;  [facility];  Service: Gastroenterology;;   TEE WITHOUT CARDIOVERSION N/A 05/24/2021   Procedure: TRANSESOPHAGEAL ECHOCARDIOGRAM (TEE);  Surgeon: [name], MD;  Location: [place] [facility];  Service: Cardiovascular;  Laterality: N/A;   TEE WITHOUT CARDIOVERSION N/A 07/11/2021   Procedure: TRANSESOPHAGEAL ECHOCARDIOGRAM (TEE);  Surgeon: Geralynn Rile, MD;  Location: [place];  Service: Cardiovascular;  Laterality: N/A;      Current Outpatient Medications  Medication Sig Dispense Refill   acetaminophen (TYLENOL) 325 MG tablet Take 2 tablets (650 mg total) by mouth every 4 (four) hours as needed for mild pain (or temp > 37.5 C (99.5 F)).     ATROVENT HFA 17 MCG/ACT inhaler Inhale 2 puffs into the lungs every 4 (four) hours as needed.     furosemide (LASIX) 40 MG tablet Take 40 mg by mouth daily as needed for fluid or edema.     gabapentin (NEURONTIN) 100 MG capsule Take 100 mg by mouth 2 (two) times daily.     Glycerin-Hypromellose-PEG 400 (ARTIFICIAL TEARS) 0.2-0.2-1 % SOLN Place 1 drop into both eyes daily as needed (tears).     levothyroxine (SYNTHROID) 112 MCG tablet Take 1 tablet (112 mcg total) by mouth every morning. 30 tablet 0   metoprolol succinate (TOPROL XL) 50 MG 24 hr tablet Take 1 tablet (50 mg total) by mouth daily. 90 tablet 3   sertraline (ZOLOFT) 50 MG tablet Take 1 tablet (50 mg total) by mouth daily. 30 tablet 0   No current facility-administered medications for this visit.    Allergies:   Patient has no known allergies.    ROS:  Please see the history of present illness.   Otherwise, review of systems are positive for ***.   All other systems are reviewed and negative.    PHYSICAL EXAM: VS:  There were no vitals taken for this visit. , BMI There is no height or weight on file to calculate BMI. GENERAL:  Well appearing NECK:  No jugular venous distention, waveform within normal limits, carotid upstroke brisk and symmetric, no bruits, no thyromegaly LUNGS:  Clear to auscultation bilaterally CHEST:  Unremarkable HEART:  PMI not displaced or sustained,S1 and S2 within normal limits, no S3, no S4, no clicks, no rubs, *** murmurs ABD:  Flat, positive bowel sounds normal in frequency in pitch, no bruits, no rebound, no guarding, no midline pulsatile mass, no hepatomegaly, no splenomegaly EXT:  2 plus pulses throughout, no edema, no cyanosis no clubbing     ***GENERAL:  Well  appearing NECK:  No jugular venous distention, waveform within normal limits, carotid upstroke brisk and symmetric, left bruits, no thyromegaly LUNGS:  Clear to auscultation bilaterally CHEST:  Unremarkable HEART:  PMI not displaced or sustained,S1 and S2 within normal limits, no S3, no S4, no clicks, no rubs, no murmurs ABD:  Flat, positive bowel sounds normal in frequency in pitch, no bruits, no rebound, no guarding, no midline pulsatile mass, no hepatomegaly, no splenomegaly EXT:  2 plus pulses throughout, no edema, no cyanosis no clubbing    EKG:  EKG is *** ordered today. Atrial fibrillation, rate ***, axis within normal limits, intervals within normal limits, no acute ST-T wave changes.   Recent Labs: 09/03/2021: ALT 13; BUN 19; Creatinine, Ser 0.92; Potassium 3.8; Sodium 144 12/18/2021: Hemoglobin 11.1; Platelets 215.0; Pro B Natriuretic peptide (BNP) 263.0    Lipid Panel  Component Value Date/Time   CHOL 134 09/01/2020 0434   TRIG 65 09/01/2020 0434   HDL 48 09/01/2020 0434   CHOLHDL 2.8 09/01/2020 0434   VLDL 13 09/01/2020 0434   LDLCALC 73 09/01/2020 0434      Wt Readings from Last 3 Encounters:  01/18/22 149 lb 3.2 oz (67.7 kg)  01/01/22 148 lb (67.1 kg)  12/18/21 148 lb (67.1 kg)      Other studies Reviewed: Additional studies/ records that were reviewed today include: *** Review of the above records demonstrates:  Please see elsewhere in the note.     ASSESSMENT AND PLAN:   Atrial fibrillation with RVR:    ***  I am going to apply a 3-day monitor.  I will check rate control with that.  No change in therapy until I see this result.   Essential hypertension:   ***  Blood pressure today was at target.  Continue meds as listed.  Shortness of breath: ***  She did have mildly elevated BNP but I am reluctant to increase diuretics as she does not sound volume overloaded and she has had orthostasis in the past.  I will defer any change in therapy until her  pulmonary function testing.    Orthostatic hypotension:   ***  She understands the need to move slowly and caution with her orthostatic changes.  Hypothyroidism:    *** She states she did have blood work done recently but I do not see a TSH.  I do not have access to these labs and I have asked her to check with Dr. Rolland Porter.  We will call is where and see if her thyroid has been done.  Watchman:   ***  She seems to be doing well status post watchman.   Current medicines are reviewed at length with the patient today.  The patient does not have concerns regarding medicines.  The following changes have been made:  ***  Labs/ tests ordered today include:  ***  No orders of the defined types were placed in this encounter.    Disposition:   FU with *** month.    Signed, [name], MD  03/20/2022 7:23 PM     [facility]  ]

## 2022-03-21 ENCOUNTER — Ambulatory Visit: Payer: Medicare Other | Attending: Cardiology | Admitting: Cardiology

## 2022-03-21 DIAGNOSIS — R0602 Shortness of breath: Secondary | ICD-10-CM

## 2022-03-21 DIAGNOSIS — Z95818 Presence of other cardiac implants and grafts: Secondary | ICD-10-CM

## 2022-03-21 DIAGNOSIS — I951 Orthostatic hypotension: Secondary | ICD-10-CM

## 2022-03-21 DIAGNOSIS — I4891 Unspecified atrial fibrillation: Secondary | ICD-10-CM

## 2022-03-21 DIAGNOSIS — I10 Essential (primary) hypertension: Secondary | ICD-10-CM

## 2022-04-11 NOTE — Progress Notes (Signed)
Virtual Visit via Video Note   Because of Ironesha C Yount's co-morbid illnesses, she is at least at moderate risk for complications without adequate follow up.  This format is felt to be most appropriate for this patient at this time.  All issues noted in this document were discussed and addressed.  A limited physical exam was performed with this format.  Please refer to the patient's chart for her consent to telehealth for [facility].       Date:  04/12/2022   ID:  Katrina Randolph, DOB [birth year], MRN [id number] The patient was identified using 2 identifiers.  Patient Location: Home Provider Location: Office/Clinic   PCP:  [name], [place] Prov[facility] Cardiologist:  [name], MD Electrophysiologist:  [name], MD {  Evaluation Performed:  Follow-Up Visit  Chief Complaint:  Atrial fib  History of Present Illness:    Katrina Randolph is a 89 y.o. female with who for follow up of chronic atrial fibrillation.  She is not on anticoagulation due to anemia that has required multiple transfusions.  There is been no clear source of her bleeding.  She was admitted to the hospital 10/12/2019-10/20/2019 with rapid atrial fibrillation, pneumonia, anemia, acute thrombocytopenia, and orthostatic dizziness.  She had presented to her PCP and was found to have a tachycardic rhythm with a heart rate greater than 160 bpm.  We were involved to help with heart rate control.  She had active bleeding and GI evaluation with colonoscopy and EGD with no clear source identified.  She has a hiatal hernia and Cameron's erosions.  She has some small polyps and diverticulosis as well as hemorrhoids.  She has had orthostatic hypotension.   Cozaar was stopped.   She was being considered for a Watchman but eventually had a CVA.   She ultimately had Watchman.  TEE follow up demonstrated normal Watchman with moderate to severe TR and RAE.  Pradaxa was stopped and she  was started on Plavix.     At the last visit I applied a 3 day monitor.  She had permanent atrial fib with an average ventricular rate of 100.  She was started on metoprolol 50 mg daily.  She has done okay since I saw her.  She did have a fall but she had no loss of consciousness.  She has chronic lower extremity swelling.  She is not having any chest pressure, neck or arm discomfort.  She has chronic shortness of breath but no PND or orthopnea.  She had no weight gain.   She does have some chronic lower extremity edema.   Past Medical History:  Diagnosis Date   Atrial fibrillation ([place])    Presence of Watchman left atrial appendage closure device 05/24/2021   Watchman FLX 84m with Dr. LQuentin Ore  Past Surgical History:  Procedure Laterality Date   ABDOMINAL HYSTERECTOMY     BIOPSY  10/19/2019   Procedure: BIOPSY;  Surgeon: BOtis Brace MD;  Location: [facility];  Service: Gastroenterology;;   BREAST LUMPECTOMY     CHOLECYSTECTOMY     COLONOSCOPY WITH PROPOFOL N/A 10/19/2019   Procedure: COLONOSCOPY WITH PROPOFOL;  Surgeon: BOtis Brace MD;  Location: [facility];  Service: Gastroenterology;  Laterality: N/A;   ESOPHAGOGASTRODUODENOSCOPY (EGD) WITH PROPOFOL N/A 10/19/2019   Procedure: ESOPHAGOGASTRODUODENOSCOPY (EGD) WITH PROPOFOL;  Surgeon: BOtis Brace MD;  Location: [facility];  Service: Gastroenterology;  Laterality: N/A;   LEFT ATRIAL APPENDAGE OCCLUSION N/A 05/24/2021   Procedure:  LEFT ATRIAL APPENDAGE OCCLUSION;  Surgeon: [name], MD;  Location: [place] [facility];  Service: Cardiovascular;  Laterality: N/A;   POLYPECTOMY  10/19/2019   Procedure: POLYPECTOMY;  Surgeon: [name], MD;  Location: [facility];  Service: Gastroenterology;;   TEE WITHOUT CARDIOVERSION N/A 05/24/2021   Procedure: TRANSESOPHAGEAL ECHOCARDIOGRAM (TEE);  Surgeon: [name], MD;  Location: [place] [facility];  Service: Cardiovascular;  Laterality: N/A;   TEE WITHOUT  CARDIOVERSION N/A 07/11/2021   Procedure: TRANSESOPHAGEAL ECHOCARDIOGRAM (TEE);  Surgeon: Geralynn Rile, MD;  Location: [place];  Service: Cardiovascular;  Laterality: N/A;     Current Meds  Medication Sig   acetaminophen (TYLENOL) 325 MG tablet Take 2 tablets (650 mg total) by mouth every 4 (four) hours as needed for mild pain (or temp > 37.5 C (99.5 F)).   sertraline (ZOLOFT) 50 MG tablet Take 1 tablet (50 mg total) by mouth daily.   [DISCONTINUED] metoprolol succinate (TOPROL XL) 50 MG 24 hr tablet Take 1 tablet (50 mg total) by mouth daily.     Allergies:   Patient has no known allergies.   Social History   Tobacco Use   Smoking status: Former    Types: Cigarettes    Quit date: 08/31/1980    Years since quitting: 41.6   Smokeless tobacco: Never   Tobacco comments:    quit over 20 Years ago  Vaping Use   Vaping Use: Never used  Substance Use Topics   Alcohol use: Yes    Comment: occasionally   Drug use: Never     Family Hx: The patient's family history is not on file.  ROS:   Please see the history of present illness.     All other systems reviewed and are negative.   Prior CV studies:   The following studies were reviewed today:  None  Labs/Other Tests and Data Reviewed:    EKG:  No ECG reviewed.  Recent Labs: 09/03/2021: ALT 13; BUN 19; Creatinine, Ser 0.92; Potassium 3.8; Sodium 144 12/18/2021: Hemoglobin 11.1; Platelets 215.0; Pro B Natriuretic peptide (BNP) 263.0   Recent Lipid Panel Lab Results  Component Value Date/Time   CHOL 134 09/01/2020 04:34 AM   TRIG 65 09/01/2020 04:34 AM   HDL 48 09/01/2020 04:34 AM   CHOLHDL 2.8 09/01/2020 04:34 AM   LDLCALC 73 09/01/2020 04:34 AM    Wt Readings from Last 3 Encounters:  01/18/22 149 lb 3.2 oz (67.7 kg)  01/01/22 148 lb (67.1 kg)  12/18/21 148 lb (67.1 kg)     Risk Assessment/Calculations:    CHA2DS2-VASc Score = 6   This indicates a 9.7% annual risk of stroke. The patient's score is  based upon: CHF History: 0 HTN History: 1 Diabetes History: 0 Stroke History: 2 Vascular Disease History: 0 Age Score: 2 Gender Score: 1         Objective:    Vital Signs:  BP (!) 148/94   Pulse 91   Ht '5\' 2"'$  (1.575 m)   SpO2 100%   BMI 27.29 kg/m    VITAL SIGNS:  reviewed GEN:  no acute distress EYES:  Black eye right side MUSCULOSKELETAL:  Bilateral let edema NEURO:  alert and oriented x 3, no obvious focal deficit PSYCH:  normal affect  ASSESSMENT & PLAN:      Atrial fibrillation with RVR:   The patient is tolerating the metoprolol.  She will keep a watch on her heart rate.  No change in therapy.  Essential hypertension:   Blood pressure is little high today but this is unusual.  No change in therapy.  Shortness of breath: This is chronic.  No change in therapy.  Hypothyroidism:    She seems to be out of her Synthroid and I reviewed with her daughter her medications and detail and she needs to be back on this.    Watchman:   She seems to be doing well status post watchman.  Edema:  We talked about conservative therapy.  We talked about careful use of as needed diuretic.     Time:   Today, I have spent 25 minutes with the patient with telehealth technology discussing the above problems.     Medication Adjustments/Labs and Tests Ordered: Current medicines are reviewed at length with the patient today.  Concerns regarding medicines are outlined above.   Tests Ordered: No orders of the defined types were placed in this encounter.   Medication Changes: Meds ordered this encounter  Medications   metoprolol succinate (TOPROL XL) 50 MG 24 hr tablet    Sig: Take 1 tablet (50 mg total) by mouth daily.    Dispense:  90 tablet    Refill:  3    Follow Up:  In Person  six months.   Signed, [name], MD  04/12/2022 4:57 PM    [place]

## 2022-04-12 ENCOUNTER — Ambulatory Visit: Payer: Medicare Other | Attending: Cardiology | Admitting: Cardiology

## 2022-04-12 ENCOUNTER — Encounter: Payer: Self-pay | Admitting: Cardiology

## 2022-04-12 VITALS — BP 148/94 | HR 91 | Ht 62.0 in

## 2022-04-12 DIAGNOSIS — M7989 Other specified soft tissue disorders: Secondary | ICD-10-CM

## 2022-04-12 DIAGNOSIS — I4811 Longstanding persistent atrial fibrillation: Secondary | ICD-10-CM | POA: Diagnosis not present

## 2022-04-12 MED ORDER — METOPROLOL SUCCINATE ER 50 MG PO TB24: 50.0000 mg | ORAL_TABLET | Freq: Every day | ORAL | 3 refills | Status: DC

## 2022-04-12 NOTE — Patient Instructions (Signed)
  Follow-Up: At [facility], you and your health needs are our priority.  As part of our continuing mission to provide you with exceptional heart care, we have created designated Provider Care Teams.  These Care Teams include your primary Cardiologist (physician) and Advanced Practice Providers (APPs -  Physician Assistants and Nurse Practitioners) who all work together to provide you with the care you need, when you need it.  We recommend signing up for the patient portal called "MyChart".  Sign up information is provided on this After Visit Summary.  MyChart is used to connect with patients for Virtual Visits (Telemedicine).  Patients are able to view lab/test results, encounter notes, upcoming appointments, etc.  Non-urgent messages can be sent to your provider as well.   To learn more about what you can do with MyChart, go to [email or web address].    Your next appointment:   6 month(s)  Provider:   James Hochrein, MD      

## 2022-05-24 IMAGING — CT CT ANGIO CHEST-ABD-PELV FOR DISSECTION W/ AND WO/W CM
2 of 7 series · 13 of 46 positions shown, 15 images · non-contrast
Comparison: None.

CLINICAL DATA: Sudden onset sharp abdominal pain, initial encounter

EXAM:
CT ANGIOGRAPHY CHEST, ABDOMEN AND PELVIS
TECHNIQUE: Non-contrast CT of the chest was initially obtained.

[Series 8: axial arterial · axial · arterial · 0.86mm/px · z∈[+910,+1477]mm · 10 of 219 slices shown, 12 images]
[im 15/219  soft-tissue]
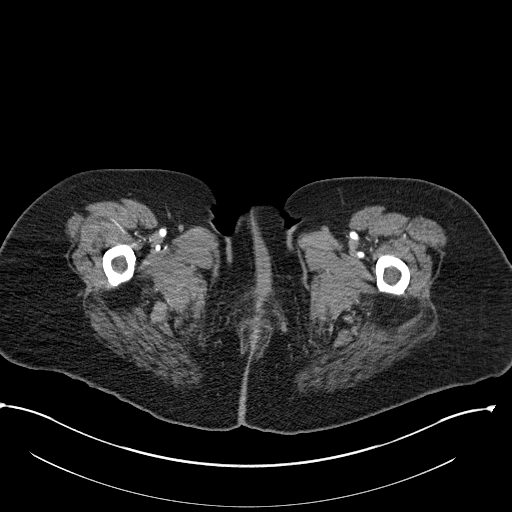
[im 15/219  bone]
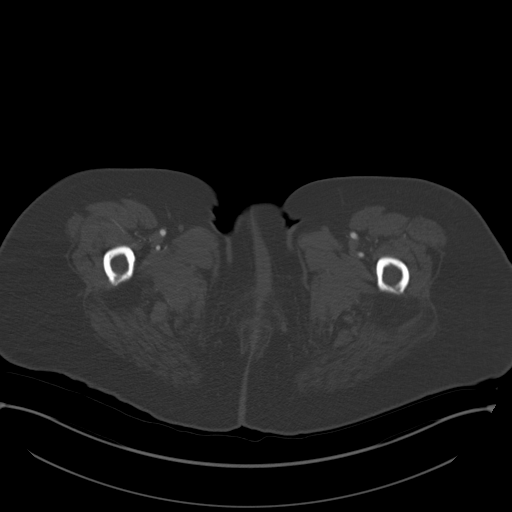
[im 44/219  soft-tissue]
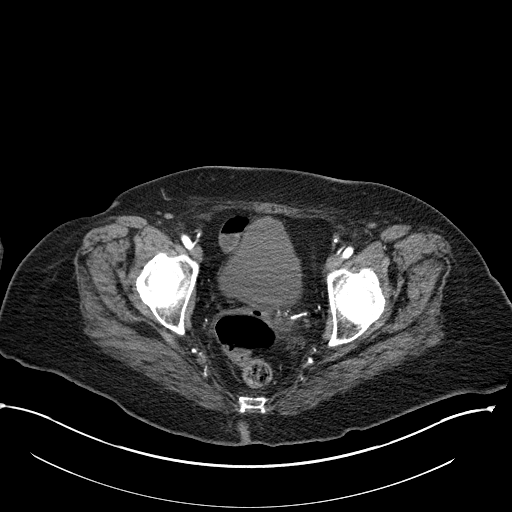
[im 59/219  soft-tissue]
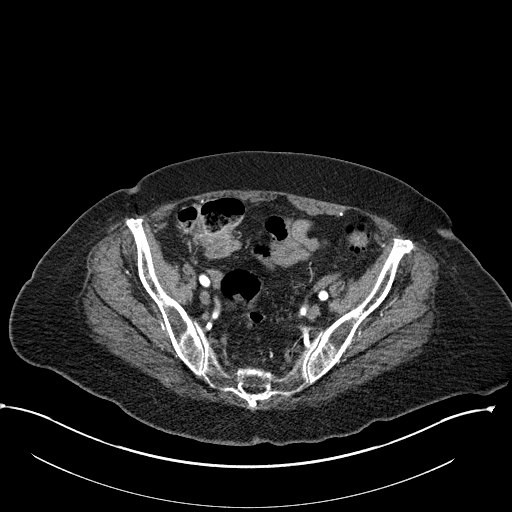
[im 73/219  soft-tissue]
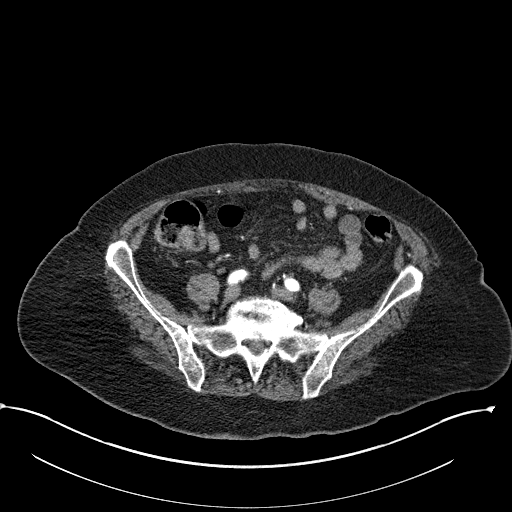
[im 102/219  soft-tissue]
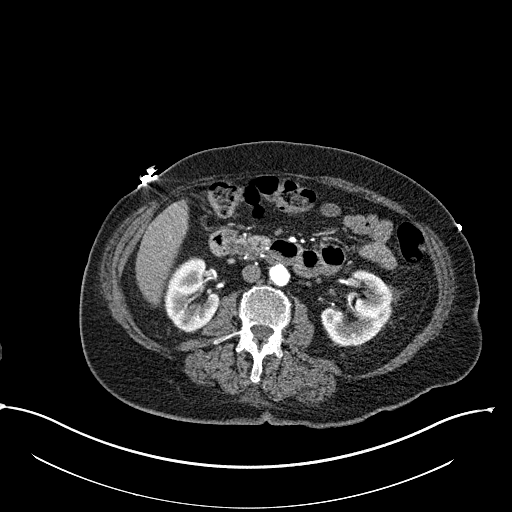
[im 117/219  soft-tissue]
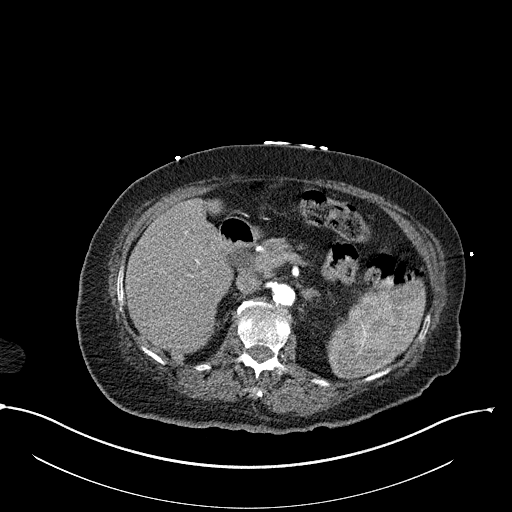
[im 146/219  soft-tissue]
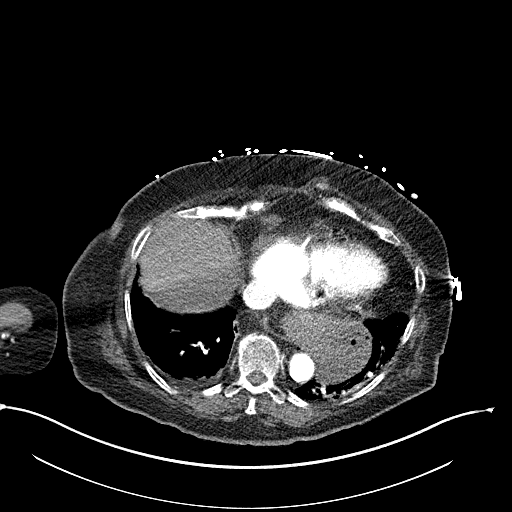
[im 160/219  soft-tissue]
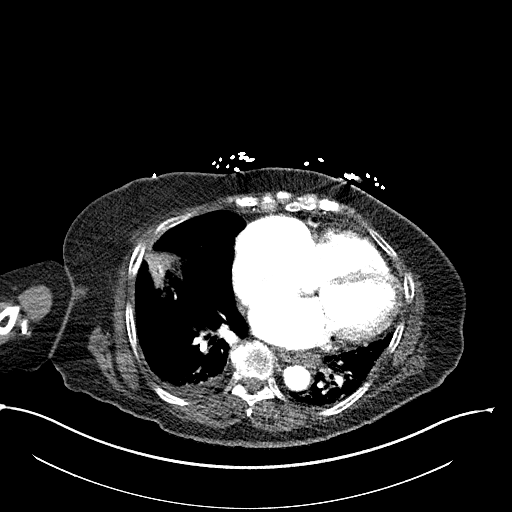
[im 175/219  soft-tissue]
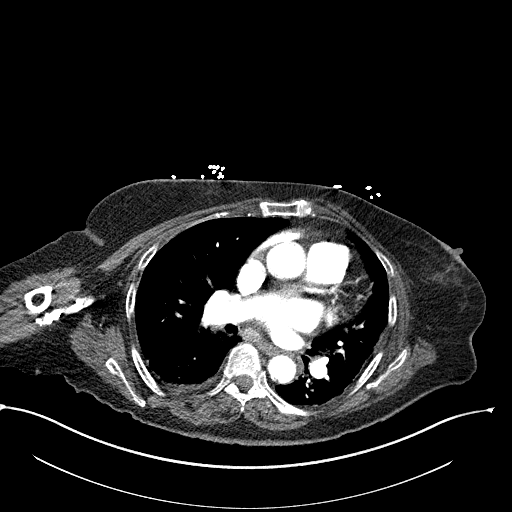
[im 175/219  bone]
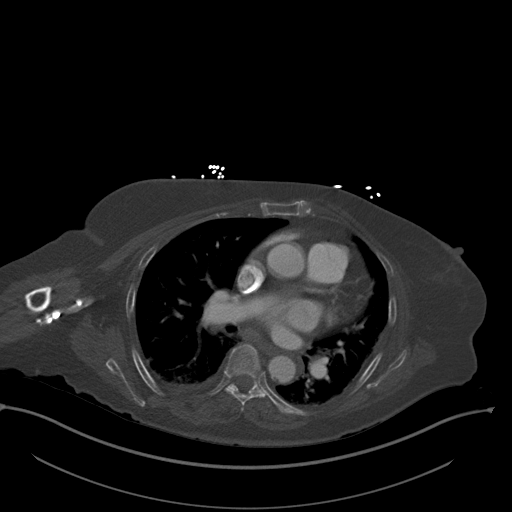
[im 204/219  soft-tissue]
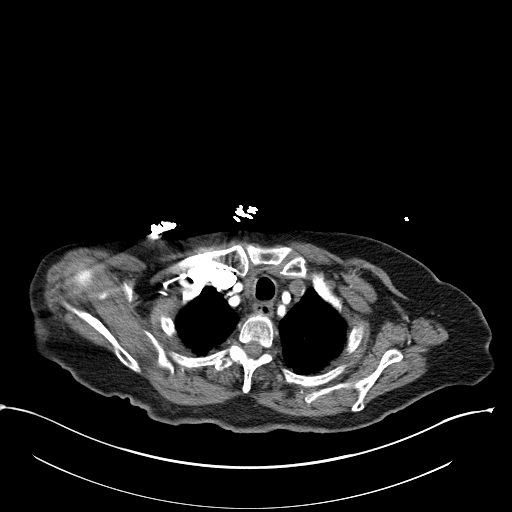

[Series 11: coronals · coronal · 0.99mm/px · 3 of 177 slices shown]
[im 45/177  soft-tissue]
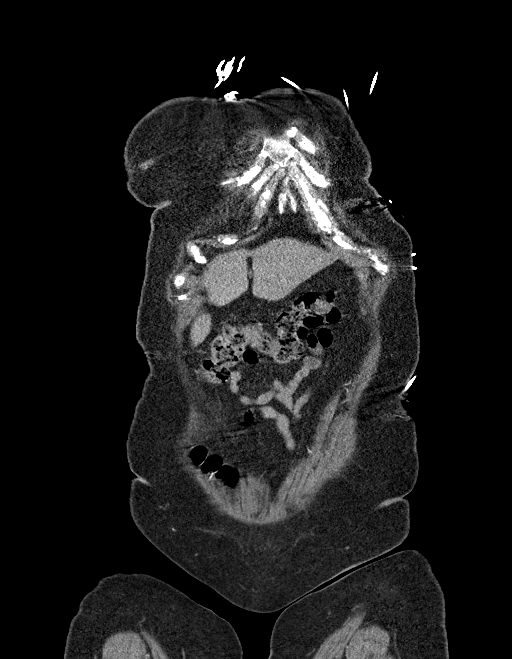
[im 89/177  soft-tissue]
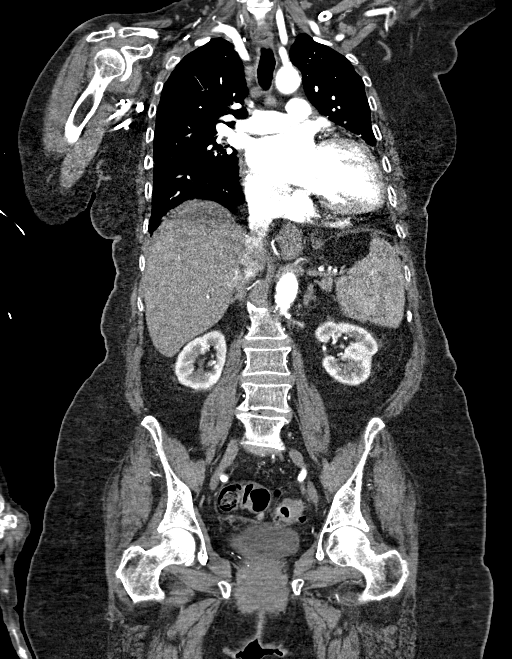
[im 133/177  soft-tissue]
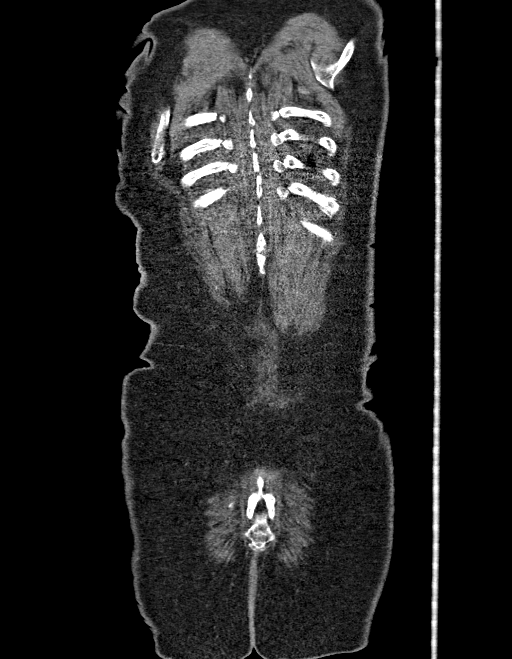

[13 of 46 positions shown; findings below may reference images not displayed]

Multidetector CT imaging through the chest, abdomen and pelvis was
performed using the standard protocol during bolus administration of
intravenous contrast. Multiplanar reconstructed images and MIPs were
obtained and reviewed to evaluate the vascular anatomy.

CONTRAST:  100mL OMNIPAQUE IOHEXOL 350 MG/ML SOLN
FINDINGS: CTA CHEST FINDINGS

Cardiovascular: Initial precontrast images of the thoracic aorta
show atherosclerotic calcification without hyperdense crescent to
suggest acute aortic injury.

Post-contrast images demonstrate no evidence of aneurysmal
dilatation or dissection. Cardiac enlargement is seen. No
significant coronary calcifications are noted. The pulmonary artery
as visualized is within normal limits.

Mediastinum/Nodes: Thoracic inlet is within normal limits. Scattered
mediastinal lymph nodes are seen. Right paratracheal lymph node
measures 11 mm in short axis. Pre tracheal node measures 13 mm in
short axis. Scattered smaller nodes are seen. These are likely
reactive in nature given changes in the lung parenchyma. The
esophagus as visualized is within normal limits.

Lungs/Pleura: The lungs are well aerated bilaterally. Small
right-sided pleural effusion is noted as well as bibasilar
atelectasis. More focal right lower lobe consolidation is noted
anterolaterally. Scattered calcified granulomas are noted no other
focal abnormality is noted.

Musculoskeletal: Degenerative changes of the thoracic spine are
seen. No acute rib abnormality is noted. No compression deformities
are noted. Sternum is within normal limits.

Review of the MIP images confirms the above findings.

CTA ABDOMEN AND PELVIS FINDINGS

VASCULAR

Aorta: Abdominal aorta demonstrates atherosclerotic calcifications
without aneurysmal dilatation or dissection.

Celiac: The celiac axis demonstrates mild soft atherosclerotic
plaque at its origin with mild poststenotic dilatation. The more
distal branches are within normal limits.

SMA: Patent without evidence of aneurysm, dissection, vasculitis or
significant stenosis.

Renals: Both renal arteries are patent without evidence of aneurysm,
dissection, vasculitis, fibromuscular dysplasia or significant
stenosis.

IMA: Patent without evidence of aneurysm, dissection, vasculitis or
significant stenosis.

Iliacs: Iliacs demonstrate atherosclerotic calcifications without
aneurysmal dilatation or dissection.

Veins: No specific venous abnormality is noted at this time.

Review of the MIP images confirms the above findings.

NON-VASCULAR

Hepatobiliary: Gallbladder has been surgically removed. Common bile
duct is enlarged in the compensatory fashion. Liver is unremarkable.

Pancreas: Unremarkable. No pancreatic ductal dilatation or
surrounding inflammatory changes.

Spleen: Normal in size without focal abnormality.

Adrenals/Urinary Tract: Adrenal glands are unremarkable. Kidneys
demonstrate a normal enhancement pattern bilaterally. No definitive
renal calculi or urinary tract obstructive changes are seen. The
bladder is partially distended.

Stomach/Bowel: Diverticular change of the colon is noted
particularly in the sigmoid colon although no findings to suggest
diverticulitis are noted. No obstructive or inflammatory changes are
seen. The appendix is small but otherwise within normal limits. No
inflammatory changes are seen. The small bowel is unremarkable.
Moderate to large-sized hiatal hernia is noted with proximal half of
the stomach within the chest cavity.

Lymphatic: No sizable lymphadenopathy is noted.

Reproductive: Status post hysterectomy. No adnexal masses.

Other: No abdominal wall hernia or abnormality. No abdominopelvic
ascites.

Musculoskeletal: Degenerative changes of lumbar spine are noted.

Review of the MIP images confirms the above findings.
IMPRESSION: CTA of the chest: No aortic abnormality is noted. No pulmonary
emboli are seen.

Scattered mediastinal lymph nodes are seen likely reactive in
nature. The lungs demonstrate bibasilar atelectasis as well as a
small right-sided pleural effusion. More focal consolidation is
noted in the anterolateral aspect of the right lower lobe. This is
most consistent with a focal pneumonia however Followup PA and
lateral chest X-ray is recommended in 3-4 weeks following trial of
antibiotic therapy to ensure resolution and exclude underlying
malignancy.

CTA of the abdomen and pelvis: Atherosclerotic changes are noted
without aneurysmal dilatation or dissection.

Diverticulosis without diverticulitis.

Hiatal hernia.

Postsurgical changes as described.

Aortic Atherosclerosis ([id number]-JAA.A).

## 2022-05-29 ENCOUNTER — Ambulatory Visit (HOSPITAL_COMMUNITY)
Admission: RE | Admit: 2022-05-29 | Discharge: 2022-05-29 | Disposition: A | Discharge status: Home / Self Care | Payer: Medicare Other | Source: Ambulatory Visit | Attending: Pain Medicine | Admitting: Pain Medicine

## 2022-05-29 ENCOUNTER — Other Ambulatory Visit (HOSPITAL_COMMUNITY): Payer: Self-pay | Admitting: Pain Medicine

## 2022-05-29 DIAGNOSIS — R1011 Right upper quadrant pain: Secondary | ICD-10-CM | POA: Diagnosis present

## 2022-05-29 MED ORDER — IOHEXOL 350 MG/ML SOLN
75.0000 mL | Freq: Once | INTRAVENOUS | Status: AC | PRN
  Administered 2022-05-29: 75 mL via INTRAVENOUS

## 2023-02-25 NOTE — Progress Notes (Signed)
Cardiology Clinic Note   Patient Name: Katrina Randolph Date of Encounter: 02/28/2023  Primary Care Provider:  Jarrett Soho, PA-C Primary Cardiologist:  Rollene Rotunda, MD  Patient Profile     Katrina Randolph 89 year old female presents to the clinic today for follow-up evaluation of her atrial fibrillation.  Past Medical History    Past Medical History:  Diagnosis Date   Atrial fibrillation (HCC)    Presence of Watchman left atrial appendage closure device 05/24/2021   Watchman FLX 27mm with Dr. Lalla Brothers   Past Surgical History:  Procedure Laterality Date   ABDOMINAL HYSTERECTOMY     BIOPSY  10/19/2019   Procedure: BIOPSY;  Surgeon: Kathi Der, MD;  Location: [facility]OS[facility];  Service: Gastroenterology;;   BREAST LUMPECTOMY     CHOLECYSTECTOMY     COLONOSCOPY WITH PROPOFOL N/A 10/19/2019   Procedure: COLONOSCOPY WITH PROPOFOL;  Surgeon: Kathi Der, MD;  Location: [facility];  Service: Gastroenterology;  Laterality: N/A;   ESOPHAGOGASTRODUODENOSCOPY (EGD) WITH PROPOFOL N/A 10/19/2019   Procedure: ESOPHAGOGASTRODUODENOSCOPY (EGD) WITH PROPOFOL;  Surgeon: Kathi Der, MD;  Location: [facility];  Service: Gastroenterology;  Laterality: N/A;   LEFT ATRIAL APPENDAGE OCCLUSION N/A 05/24/2021   Procedure: LEFT ATRIAL APPENDAGE OCCLUSION;  Surgeon: Lanier Prude, MD;  Location: [facility] INVASIVE CV LAB;  Service: Cardiovascular;  Laterality: N/A;   POLYPECTOMY  10/19/2019   Procedure: POLYPECTOMY;  Surgeon: Kathi Der, MD;  Location: [facility];  Service: Gastroenterology;;   TEE WITHOUT CARDIOVERSION N/A 05/24/2021   Procedure: TRANSESOPHAGEAL ECHOCARDIOGRAM (TEE);  Surgeon: Lanier Prude, MD;  Location: [facility] INVASIVE CV LAB;  Service: Cardiovascular;  Laterality: N/A;   TEE WITHOUT CARDIOVERSION N/A 07/11/2021   Procedure: TRANSESOPHAGEAL ECHOCARDIOGRAM (TEE);  Surgeon: Sande Rives, MD;  Location: [facility] ENDOSCOPY;  Service: Cardiovascular;   Laterality: N/A;    Allergies  No Known Allergies  History of Present Illness     Katrina Randolph has a PMH of paroxysmal atrial fibrillation, orthostatic hypotension, CVA, left middle cerebral artery stroke, pneumonia, GERD, hypothyroidism, orthostatic dizziness, iron deficiency anemia, chronic depression, breast CVA, and is a DNR.  She is not a candidate for anticoagulation due to her anemia which required multiple transfusions.  There was no clear source of bleeding.  She was admitted to the hospital 8/21 until 10/20/2019.  She was diagnosed with rapid atrial fibrillation, pneumonia, anemia, acute thrombocytopenia, and orthostatic dizziness.  She presented to her PCP and was found to have tachycardia with a heart rate greater than 160.  Cardiology was consulted to help with rate control.  She underwent evaluation by GI which showed no active bleeding with colonoscopy and EGD.  She was noted to have a hiatal hernia and Cameron's erosions.  She underwent TEE which showed normal watchman with moderate-severe TR and RAE.  Her Pradaxa was stopped and she was started on Plavix.   She was seen in follow-up by Dr. Antoine Poche on 01/01/2022.  She had been seen by Dr. Kendrick Fries for increasing shortness of breath.  Pulmonary function test was planned.  BNP was mildly elevated.  She was able to walk around the office to check oxygen saturation.  Her heart rate elevated to 170s.  On cardiac evaluation her main complaint was fatigue.  She did report shortness of breath but did note lack of energy.  She also did not feel that she was sleeping well.  She denied PND and orthopnea.  She denied chest pain.  She denied feeling palpitations.  She denied recent  orthostatic dizziness, presyncope or syncope.  She was noted to have an unsteady gait and was walking with a walker.  3-day cardiac event monitor was ordered.  She was noted to have ventricular rates on average above 100 bpm and rare ventricular ectopy.  Atrial  fibrillation was also noted.  She was started on metoprolol succinate 50 mg daily.  Follow-up was planned for 1 month.  She return for follow-up 04/12/2022.  She was noted to have chronic lower extremity swelling.  She denied chest pain.  She was noted to have chronic shortness of breath but no PND or orthopnea.  She had no weight gain.  She presents to the clinic today for follow-up evaluation and states she has noticed some increased swelling in her lower extremities.  Her blood pressure was elevated initially at 152/94 and on recheck was 118/72.  We reviewed venous stasis and dependent edema.  She and her daughter expressed understanding.  She reports that she is planning to have some dental work.  I will prescribe amoxicillin 2 g 1 hour before her dental appointment.  I will also give her the sleep hygiene instructions.  Will plan follow-up in 6 months..  Today she denies chest pain, increased shortness of breath, fatigue, palpitations, melena, hematuria, hemoptysis, diaphoresis, weakness, presyncope, syncope, orthopnea, and PND.     Home Medications    Prior to Admission medications   Medication Sig Start Date End Date Taking? Authorizing Provider  acetaminophen (TYLENOL) 325 MG tablet Take 2 tablets (650 mg total) by mouth every 4 (four) hours as needed for mild pain (or temp > 37.5 C (99.5 F)). 06/13/20   Angiulli, Mcarthur Rossetti, PA-C  furosemide (LASIX) 40 MG tablet Take 40 mg by mouth daily as needed for fluid or edema. Patient not taking: Reported on 04/12/2022    [provider]  Glycerin-Hypromellose-PEG 400 (ARTIFICIAL TEARS) 0.2-0.2-1 % SOLN Place 1 drop into both eyes daily as needed (tears). Patient not taking: Reported on 04/12/2022    [provider]  levothyroxine (SYNTHROID) 112 MCG tablet Take 1 tablet (112 mcg total) by mouth every morning. Patient not taking: Reported on 04/12/2022 06/13/20   Angiulli, Mcarthur Rossetti, PA-C  metoprolol succinate (TOPROL XL) 50 MG 24 hr tablet  Take 1 tablet (50 mg total) by mouth daily. 04/12/22   Rollene Rotunda, MD  sertraline (ZOLOFT) 50 MG tablet Take 1 tablet (50 mg total) by mouth daily. 06/13/20   Angiulli, Mcarthur Rossetti, PA-C    Family History    History reviewed. No pertinent family history. She indicated that her mother is deceased. She indicated that her father is deceased.  Social History    Social History   Socioeconomic History   Marital status: Widowed    Spouse name: Not on file   Number of children: Not on file   Years of education: Not on file   Highest education level: Not on file  Occupational History   Not on file  Tobacco Use   Smoking status: Former    Current packs/day: 0.00    Types: Cigarettes    Quit date: 08/31/1980    Years since quitting: 42.5   Smokeless tobacco: Never   Tobacco comments:    quit over 20 Years ago  Vaping Use   Vaping status: Never Used  Substance and Sexual Activity   Alcohol use: Yes    Comment: occasionally   Drug use: Never   Sexual activity: Not Currently  Other Topics Concern   Not on  file  Social History Narrative   Lives alone   Right Handed    Drinks 2 cups caffeine daily   Social Drivers of Corporate investment banker Strain: Not on file  Food Insecurity: Not on file  Transportation Needs: Not on file  Physical Activity: Not on file  Stress: Not on file  Social Connections: Not on file  Intimate Partner Violence: Not on file     Review of Systems    General:  No chills, fever, night sweats or weight changes.  Cardiovascular:  No chest pain, dyspnea on exertion, edema, orthopnea, palpitations, paroxysmal nocturnal dyspnea. Dermatological: No rash, lesions/masses Respiratory: No cough, dyspnea Urologic: No hematuria, dysuria Abdominal:   No nausea, vomiting, diarrhea, bright red blood per rectum, melena, or hematemesis Neurologic:  No visual changes, wkns, changes in mental status. All other systems reviewed and are otherwise negative except as  noted above.  Physical Exam    VS:  BP 118/72   Pulse 72   Ht 5\' 1"  (1.549 m)   Wt 152 lb 12.8 oz (69.3 kg)   SpO2 92%   BMI 28.87 kg/m  , BMI Body mass index is 28.87 kg/m. GEN: Well nourished, well developed, in no acute distress. HEENT: normal. Neck: Supple, no JVD, carotid bruits, or masses. Cardiac: RRR, no murmurs, rubs, or gallops. No clubbing, cyanosis, bilateral lower extremity 1+ pitting edema.  Radials/DP/PT 2+ and equal bilaterally.  Respiratory:  Respirations regular and unlabored, clear to auscultation bilaterally. GI: Soft, nontender, nondistended, BS + x 4. MS: no deformity or atrophy. Skin: warm and dry, no rash. Neuro:  Strength and sensation are intact. Psych: Normal affect.  Accessory Clinical Findings    Recent Labs: No results found for requested labs within last 365 days.   Recent Lipid Panel    Component Value Date/Time   CHOL 134 09/01/2020 0434   TRIG 65 09/01/2020 0434   HDL 48 09/01/2020 0434   CHOLHDL 2.8 09/01/2020 0434   VLDL 13 09/01/2020 0434   LDLCALC 73 09/01/2020 0434         ECG personally reviewed by me today- EKG Interpretation Date/Time:  Friday February 28 2023 15:11:38 EST Ventricular Rate:  73 PR Interval:    QRS Duration:  76 QT Interval:  430 QTC Calculation: 473 R Axis:   53  Text Interpretation: Atrial fibrillation When compared with ECG of 25-May-2021 07:31, No significant change was found Confirmed by Edd Fabian [phone]) on 02/28/2023 4:35:57 PM    Cardiac event monitor 01/01/2022  Atrial fibrillation.   Ventricular rate is on average above 100 beats per minute.   Rare ventricular ectopy.  TEE 07/11/2021  IMPRESSIONS     1. 27 Watchman FLX. No device leak. No device related thrombus. Device is  well-seated. Left atrial size was severely dilated. No left atrial/left  atrial appendage thrombus was detected.   2. Left ventricular ejection fraction, by estimation, is 60 to 65%. The  left ventricle has  normal function.   3. Right ventricular systolic function is moderately reduced. The right  ventricular size is moderately enlarged.   4. Right atrial size was severely dilated.   5. The mitral valve is grossly normal. Moderate mitral valve  regurgitation. No evidence of mitral stenosis.   6. Moderate to severe tricuspid regurgitation. 3D VCA 0.52 cm2. Tricuspid  valve regurgitation is moderate to severe.   7. The aortic valve is tricuspid. Aortic valve regurgitation is not  visualized. No aortic stenosis is present.  8. There is Moderate (Grade III) layered plaque involving the descending  aorta.   FINDINGS   Left Ventricle: Left ventricular ejection fraction, by estimation, is 60  to 65%. The left ventricle has normal function. The left ventricular  internal cavity size was normal in size.   Right Ventricle: The right ventricular size is moderately enlarged. No  increase in right ventricular wall thickness. Right ventricular systolic  function is moderately reduced.   Left Atrium: 27 Watchman FLX. No device leak. No device related thrombus.  Device is well-seated. Left atrial size was severely dilated. No left  atrial/left atrial appendage thrombus was detected.   Right Atrium: Right atrial size was severely dilated.   Pericardium: Trivial pericardial effusion is present.   Mitral Valve: The mitral valve is grossly normal. Moderate mitral valve  regurgitation. No evidence of mitral valve stenosis.   Tricuspid Valve: Moderate to severe tricuspid regurgitation. 3D VCA 0.52  cm2. The tricuspid valve is grossly normal. Tricuspid valve regurgitation  is moderate to severe. No evidence of tricuspid stenosis.   Aortic Valve: The aortic valve is tricuspid. Aortic valve regurgitation is  not visualized. No aortic stenosis is present.   Pulmonic Valve: The pulmonic valve was grossly normal. Pulmonic valve  regurgitation is trivial. No evidence of pulmonic stenosis.   Aorta: The  aortic root and ascending aorta are structurally normal, with  no evidence of dilitation. There is moderate (Grade III) layered plaque  involving the descending aorta.   Venous: The left upper pulmonary vein, left lower pulmonary vein, right  lower pulmonary vein and right upper pulmonary vein are normal.   IAS/Shunts: The atrial septum is grossly normal.     Assessment & Plan   1.  Atrial fibrillation-EKG today shows atrial fibrillation.  Not a candidate for anticoagulation due to anemia which required multiple transfusions.  CHA2DS2-VASc score 6. Continue metoprolol Avoid triggers caffeine, chocolate, EtOH, dehydration etc.  Essential hypertension-BP today 118/72. Continue to monitor blood pressure Continue metoprolol Heart healthy low-sodium diet  Orthostatic hypotension-BP today 118/72. Change positions slowly Lower extremity support stockings Continue walker use  Watchman device-stable.  TEE 07/12/2019 showed 27 Watchman FLX, no device leak, no device related thrombus, and well-seated device.  EF was noted to be 60-65%, no left atrial appendage thrombus detected. No plans for repeat echocardiogram at this time.  DOE-breathing at baseline.  Chronic shortness of breath. Continue current physical activity Heart healthy low-sodium diet Follows with pulmonology  Disposition: Follow-up with Dr. Antoine Poche or me in 6 months.   Thomasene Ripple. Seyed Heffley NP-C     02/28/2023, 4:35 PM [facility] [address] Office [phone] Fax [phone]    I spent 14 minutes examining this patient, reviewing medications, and using patient centered shared decision making involving their cardiac care.   I spent greater than 20 minutes reviewing their past medical history,  medications, and prior cardiac tests.

## 2023-02-28 ENCOUNTER — Ambulatory Visit: Payer: Medicare Other | Attending: General Practice | Admitting: General Practice

## 2023-02-28 ENCOUNTER — Encounter: Payer: Self-pay | Admitting: General Practice

## 2023-02-28 VITALS — BP 118/72 | HR 72 | Ht 61.0 in | Wt 152.8 lb

## 2023-02-28 DIAGNOSIS — I951 Orthostatic hypotension: Secondary | ICD-10-CM | POA: Diagnosis not present

## 2023-02-28 DIAGNOSIS — I10 Essential (primary) hypertension: Secondary | ICD-10-CM

## 2023-02-28 DIAGNOSIS — I480 Paroxysmal atrial fibrillation: Secondary | ICD-10-CM | POA: Diagnosis not present

## 2023-02-28 DIAGNOSIS — R0609 Other forms of dyspnea: Secondary | ICD-10-CM | POA: Diagnosis not present

## 2023-02-28 MED ORDER — AMOXICILLIN 500 MG PO CAPS: 2000.0000 mg | ORAL_CAPSULE | Freq: Once | ORAL | 0 refills | Status: AC

## 2023-02-28 NOTE — Patient Instructions (Signed)
Medication Instructions:  TAKE AMOXICILLIN 2 GRAMS (4 CAP=2,000MG ) BEFORE DENTAL PROCEDURE *If you need a refill on your cardiac medications before your next appointment, please call your pharmacy*  Lab Work: NONE  Testing/Procedures: NONE  Follow-Up: At [facility], you and your health needs are our priority.  As part of our continuing mission to provide you with exceptional heart care, we have created designated Provider Care Teams.  These Care Teams include your primary Cardiologist (physician) and Advanced Practice Providers (APPs -  Physician Assistants and Nurse Practitioners) who all work together to provide you with the care you need, when you need it.  Your next appointment:   12 month(s)  Provider:   Rollene Rotunda, MD     Quality Sleep Information, Adult Quality sleep is important for your mental and physical health. It also improves your quality of life. Quality sleep means you: Are asleep for most of the time you are in bed. Fall asleep within 30 minutes. Wake up no more than once a night. Are awake for no longer than 20 minutes if you do wake up during the night. Most adults need 7-8 hours of quality sleep each night. How can poor sleep affect me? If you do not get enough quality sleep, you may have: Mood swings. Daytime sleepiness. Decreased alertness, reaction time, and concentration. Sleep disorders, such as insomnia and sleep apnea. Difficulty with: Solving problems. Coping with stress. Paying attention. These issues may affect your performance and productivity at work, school, and home. Lack of sleep may also put you at higher risk for accidents, suicide, and risky behaviors. If you do not get quality sleep, you may also be at higher risk for several health problems, including: Infections. Type 2 diabetes. Heart disease. High blood pressure. Obesity. Worsening of long-term conditions, like arthritis, kidney disease, depression, Parkinson's  disease, and epilepsy. What actions can I take to get more quality sleep? Sleep schedule and routine Stick to a sleep schedule. Go to sleep and wake up at about the same time each day. Do not try to sleep less on weekdays and make up for lost sleep on weekends. This does not work. Limit naps during the day to 30 minutes or less. Do not take naps in the late afternoon. Make time to relax before bed. Reading, listening to music, or taking a hot bath promotes quality sleep. Make your bedroom a place that promotes quality sleep. Keep your bedroom dark, quiet, and at a comfortable room temperature. Make sure your bed is comfortable. Avoid using electronic devices that give off bright blue light for 30 minutes before bedtime. Your brain perceives bright blue light as sunlight. This includes television, phones, and computers. If you are lying awake in bed for longer than 20 minutes, get up and do a relaxing activity until you feel sleepy. Lifestyle     Try to get at least 30 minutes of exercise on most days. Do not exercise 2-3 hours before going to bed. Do not use any products that contain nicotine or tobacco. These products include cigarettes, chewing tobacco, and vaping devices, such as e-cigarettes. If you need help quitting, ask your health care provider. Do not drink caffeinated beverages for at least 8 hours before going to bed. Coffee, tea, and some sodas contain caffeine. Do not drink alcohol or eat large meals close to bedtime. Try to get at least 30 minutes of sunlight every day. Morning sunlight is best. Medical concerns Work with your health care provider to treat medical  conditions that may affect sleeping, such as: Nasal obstruction. Snoring. Sleep apnea and other sleep disorders. Talk to your health care provider if you think any of your prescription medicines may cause you to have difficulty falling or staying asleep. If you have sleep problems, talk with a sleep consultant. If you  think you have a sleep disorder, talk with your health care provider about getting evaluated by a specialist. Where to find more information Sleep Foundation: sleepfoundation.org American Academy of Sleep Medicine: aasm.org Centers for Disease Control and Prevention (CDC): TonerPromos.no Contact a health care provider if: You have trouble getting to sleep or staying asleep. You often wake up very early in the morning and cannot get back to sleep. You have daytime sleepiness. You have daytime sleep attacks of suddenly falling asleep and sudden muscle weakness (narcolepsy). You have a tingling sensation in your legs with a strong urge to move your legs (restless legs syndrome). You stop breathing briefly during sleep (sleep apnea). You think you have a sleep disorder or are taking a medicine that is affecting your quality of sleep. Summary Most adults need 7-8 hours of quality sleep each night. Getting enough quality sleep is important for your mental and physical health. Make your bedroom a place that promotes quality sleep, and avoid things that may cause you to have poor sleep, such as alcohol, caffeine, smoking, or large meals. Talk to your health care provider if you have trouble falling asleep or staying asleep. This information is not intended to replace advice given to you by your health care provider. Make sure you discuss any questions you have with your health care provider. Document Revised: 05/23/2021 Document Reviewed: 05/23/2021 Elsevier Patient Education  2024 [employer].

## 2023-07-01 ENCOUNTER — Telehealth: Payer: Self-pay

## 2023-07-01 NOTE — Telephone Encounter (Signed)
 Called to check on patient, who had LAAO on 05/24/2021.  Left message to call back.

## 2023-07-16 ENCOUNTER — Encounter: Payer: Self-pay | Admitting: Cardiology

## 2023-07-16 NOTE — Telephone Encounter (Signed)
 Left message to call back

## 2023-07-18 ENCOUNTER — Other Ambulatory Visit: Payer: Self-pay | Admitting: Cardiology

## 2023-09-09 ENCOUNTER — Encounter: Payer: Self-pay | Admitting: Cardiology

## 2023-09-09 MED ORDER — METOPROLOL SUCCINATE ER 50 MG PO TB24: 50.0000 mg | ORAL_TABLET | Freq: Every day | ORAL | 0 refills | Status: DC

## 2023-10-16 ENCOUNTER — Ambulatory Visit: Admitting: Cardiology

## 2023-10-18 NOTE — Progress Notes (Unsigned)
 Cardiology Office Note:   Date:  10/21/2023  ID:  DEAVEN BARRON, DOB 05-02-1930, MRN [id number] PCP: Katina Pfeiffer, PA-C  [facility] Providers Cardiologist:  Lynwood Schilling, MD Electrophysiologist:  OLE ONEIDA HOLTS, MD {  History of Present Illness:   Katrina Randolph is a 89 y.o. female who for follow up of chronic atrial fibrillation.  She is not on anticoagulation due to anemia that has required multiple transfusions.  There is been no clear source of her bleeding.  She was admitted to the hospital 10/12/2019-10/20/2019 with rapid atrial fibrillation, pneumonia, anemia, acute thrombocytopenia, and orthostatic dizziness.  She had presented to her PCP and was found to have a tachycardic rhythm with a heart rate greater than 160 bpm.  We were involved to help with heart rate control.  She had active bleeding and GI evaluation with colonoscopy and EGD with no clear source identified.  She has a hiatal hernia and Cameron's erosions.  She has some small polyps and diverticulosis as well as hemorrhoids.  She has had orthostatic hypotension.   Cozaar  was stopped.   She was being considered for a Watchman but eventually had a CVA.   She ultimately had Watchman.  TEE follow up demonstrated normal Watchman with moderate to severe TR and RAE.  Pradaxa  was stopped    She had a monitor.  She had permanent atrial fib with an average ventricular rate of 100.  She was started on metoprolol  50 mg daily.  She presents for follow up.  Her biggest complaint is fatigue.  She just feels more tired and this is seems to be happening more recently.  She lives in a retirement home.  She walks in the hallway and she just says it is harder for her to do stuff like that.  She is not describing new shortness of breath, PND or orthopnea.  She is not having any new palpitations, presyncope or syncope.  She does have some she thinks increased chronic lower extremity swelling.   ROS: As stated in the HPI and negative  for all other systems.  Studies Reviewed:    EKG:   EKG Interpretation Date/Time:  Tuesday October 21 2023 15:21:57 EDT Ventricular Rate:  75 PR Interval:    QRS Duration:  78 QT Interval:  396 QTC Calculation: 442 R Axis:   7  Text Interpretation: Atrial fibrillation Poor anterior R wave progression When compared with ECG of 28-Feb-2023 15:11, No significant change since last tracing Confirmed by Schilling Lynwood ([id number]) on 10/21/2023 3:42:07 PM    Risk Assessment/Calculations:    CHA2DS2-VASc Score = 4   This indicates a 4.8% annual risk of stroke. The patient's score is based upon: CHF History: 0 HTN History: 1 Diabetes History: 0 Stroke History: 0 Vascular Disease History: 0 Age Score: 2 Gender Score: 1     Physical Exam:   VS:  BP (!) 140/78 (BP Location: Right Arm, Patient Position: Sitting, Cuff Size: Normal)   Pulse 75   Ht 5' 2 (1.575 m)   Wt 156 lb 9.6 oz (71 kg)   SpO2 96%   BMI 28.64 kg/m    Wt Readings from Last 3 Encounters:  10/21/23 156 lb 9.6 oz (71 kg)  02/28/23 152 lb 12.8 oz (69.3 kg)  01/18/22 149 lb 3.2 oz (67.7 kg)     GEN: Well nourished, well developed in no acute distress NECK: No JVD; No carotid bruits CARDIAC: Irregular RR, no murmurs, rubs, gallops RESPIRATORY:  Clear to auscultation  without rales, wheezing or rhonchi  ABDOMEN: Soft, non-tender, non-distended EXTREMITIES:  Mild leg edema; No deformity   ASSESSMENT AND PLAN:   Atrial fibrillation with RVR:   While her beta-blocker could be causing some of her fatigue.  I think she needs this for rate control and I do not think it is the likely culprit.   No change in therapy otherwise except as below.   Essential hypertension:   Blood pressure is at target.  No change in therapy.    Hypothyroidism:    I will check a TSH given her fatigue.  Watchman:   She is doing well status post watchman.  Edema:   I am going to have her start taking her Lasix  at in the morning.  Interestingly  she has been taking it at night.  I wonder if this might be causing some disrupted sleep and contributing to her fatigue.  For 3 days she is gena take an extra 20 mg.  She is going to keep her feet elevated.  She cannot wear the compression socks.   Fatigue: I will check a CBC as well as a TSH.  I am going to try to improve her sleep by changing the timing of the Lasix  as above.  Follow up with me in 1 year or sooner if needed.  Signed, Lynwood Schilling, MD

## 2023-10-21 ENCOUNTER — Encounter: Payer: Self-pay | Admitting: Cardiology

## 2023-10-21 ENCOUNTER — Ambulatory Visit: Attending: Cardiology | Admitting: Cardiology

## 2023-10-21 VITALS — BP 140/78 | HR 75 | Ht 62.0 in | Wt 156.6 lb

## 2023-10-21 DIAGNOSIS — I10 Essential (primary) hypertension: Secondary | ICD-10-CM

## 2023-10-21 DIAGNOSIS — M7989 Other specified soft tissue disorders: Secondary | ICD-10-CM

## 2023-10-21 DIAGNOSIS — Z95818 Presence of other cardiac implants and grafts: Secondary | ICD-10-CM

## 2023-10-21 DIAGNOSIS — R5383 Other fatigue: Secondary | ICD-10-CM

## 2023-10-21 DIAGNOSIS — I4891 Unspecified atrial fibrillation: Secondary | ICD-10-CM | POA: Diagnosis not present

## 2023-10-21 NOTE — Patient Instructions (Addendum)
 Medication Instructions:  Take an extra 20 mg of Lasix  daily in the morning ( for 3 days *If you need a refill on your cardiac medications before your next appointment, please call your pharmacy*  Lab Work: CBC, TSH today at [facility] If you have labs (blood work) drawn today and your tests are completely normal, you will receive your results only by: MyChart Message (if you have MyChart) OR A paper copy in the mail If you have any lab test that is abnormal or we need to change your treatment, we will call you to review the results.  Testing/Procedures: NONE  Follow-Up: At [facility], you and your health needs are our priority.  As part of our continuing mission to provide you with exceptional heart care, our providers are all part of one team.  This team includes your primary Cardiologist (physician) and Advanced Practice Providers or APPs (Physician Assistants and Nurse Practitioners) who all work together to provide you with the care you need, when you need it.  Your next appointment:   1 year  Provider:   Lav[place], MD  We recommend signing up for the patient portal called MyChart.  Sign up information is provided on this After Visit Summary.  MyChart is used to connect with patients for Virtual Visits (Telemedicine).  Patients are able to view lab/test results, encounter notes, upcoming appointments, etc.  Non-urgent messages can be sent to your provider as well.   To learn more about what you can do with MyChart, go to [email or web address].   Other Instructions EKG unchanged

## 2024-01-15 ENCOUNTER — Other Ambulatory Visit: Payer: Self-pay | Admitting: Cardiology
